# Patient Record
Sex: Male | Born: 1960 | Race: White | Hispanic: No | Smoking: Former smoker
Health system: Southern US, Community
[De-identification: ages and names within clinical notes are randomized; demographics above are authoritative.]

## PROBLEM LIST (undated history)

## (undated) DIAGNOSIS — G709 Myoneural disorder, unspecified: Secondary | ICD-10-CM

## (undated) DIAGNOSIS — F32A Depression, unspecified: Secondary | ICD-10-CM

## (undated) DIAGNOSIS — L570 Actinic keratosis: Secondary | ICD-10-CM

## (undated) DIAGNOSIS — C801 Malignant (primary) neoplasm, unspecified: Secondary | ICD-10-CM

## (undated) DIAGNOSIS — H9319 Tinnitus, unspecified ear: Secondary | ICD-10-CM

## (undated) DIAGNOSIS — E119 Type 2 diabetes mellitus without complications: Secondary | ICD-10-CM

## (undated) DIAGNOSIS — E785 Hyperlipidemia, unspecified: Secondary | ICD-10-CM

## (undated) DIAGNOSIS — R0600 Dyspnea, unspecified: Secondary | ICD-10-CM

## (undated) DIAGNOSIS — A159 Respiratory tuberculosis unspecified: Secondary | ICD-10-CM

## (undated) DIAGNOSIS — I10 Essential (primary) hypertension: Secondary | ICD-10-CM

## (undated) DIAGNOSIS — I739 Peripheral vascular disease, unspecified: Secondary | ICD-10-CM

## (undated) DIAGNOSIS — R5383 Other fatigue: Secondary | ICD-10-CM

## (undated) DIAGNOSIS — E291 Testicular hypofunction: Secondary | ICD-10-CM

## (undated) DIAGNOSIS — G4730 Sleep apnea, unspecified: Secondary | ICD-10-CM

## (undated) DIAGNOSIS — G629 Polyneuropathy, unspecified: Secondary | ICD-10-CM

## (undated) DIAGNOSIS — F329 Major depressive disorder, single episode, unspecified: Secondary | ICD-10-CM

## (undated) HISTORY — PX: KNEE ARTHROSCOPY: SUR90

## (undated) HISTORY — DX: Testicular hypofunction: E29.1

## (undated) HISTORY — PX: OTHER SURGICAL HISTORY: SHX169

## (undated) HISTORY — PX: CARPAL TUNNEL RELEASE: SHX101

## (undated) HISTORY — DX: Actinic keratosis: L57.0

## (undated) HISTORY — DX: Polyneuropathy, unspecified: G62.9

## (undated) HISTORY — DX: Hyperlipidemia, unspecified: E78.5

## (undated) HISTORY — DX: Type 2 diabetes mellitus without complications: E11.9

## (undated) HISTORY — DX: Other fatigue: R53.83

## (undated) HISTORY — DX: Peripheral vascular disease, unspecified: I73.9

---

## 1989-08-03 HISTORY — PX: VASECTOMY: SHX75

## 2005-03-19 ENCOUNTER — Ambulatory Visit: Payer: Self-pay | Admitting: Family Medicine

## 2006-12-13 ENCOUNTER — Ambulatory Visit: Payer: Self-pay | Admitting: Family Medicine

## 2007-03-24 ENCOUNTER — Emergency Department: Payer: Self-pay | Admitting: Emergency Medicine

## 2008-08-13 ENCOUNTER — Ambulatory Visit: Payer: Self-pay | Admitting: Family Medicine

## 2009-02-27 ENCOUNTER — Ambulatory Visit: Payer: Self-pay | Admitting: Family Medicine

## 2009-03-11 ENCOUNTER — Ambulatory Visit: Payer: Self-pay | Admitting: Family Medicine

## 2010-03-23 ENCOUNTER — Ambulatory Visit: Payer: Self-pay | Admitting: Family Medicine

## 2010-06-26 ENCOUNTER — Emergency Department: Payer: Self-pay | Admitting: Emergency Medicine

## 2010-08-03 HISTORY — PX: BACK SURGERY: SHX140

## 2010-08-03 HISTORY — PX: CERVICAL FUSION: SHX112

## 2010-08-06 ENCOUNTER — Ambulatory Visit (HOSPITAL_COMMUNITY)
Admission: RE | Admit: 2010-08-06 | Discharge: 2010-08-07 | Payer: Self-pay | Source: Home / Self Care | Attending: Neurosurgery | Admitting: Neurosurgery

## 2010-10-13 LAB — BASIC METABOLIC PANEL
CO2: 30 mEq/L (ref 19–32)
Calcium: 9.2 mg/dL (ref 8.4–10.5)
Chloride: 105 mEq/L (ref 96–112)
Glucose, Bld: 121 mg/dL — ABNORMAL HIGH (ref 70–99)
Potassium: 5.4 mEq/L — ABNORMAL HIGH (ref 3.5–5.1)
Sodium: 139 mEq/L (ref 135–145)

## 2010-10-13 LAB — CBC
HCT: 46.4 % (ref 39.0–52.0)
Hemoglobin: 16.8 g/dL (ref 13.0–17.0)
MCH: 33.6 pg (ref 26.0–34.0)
MCHC: 36.2 g/dL — ABNORMAL HIGH (ref 30.0–36.0)
MCV: 92.8 fL (ref 78.0–100.0)

## 2011-03-11 ENCOUNTER — Ambulatory Visit: Payer: Self-pay | Admitting: Family Medicine

## 2011-04-23 ENCOUNTER — Other Ambulatory Visit: Payer: Self-pay | Admitting: Neurosurgery

## 2011-04-23 DIAGNOSIS — M79606 Pain in leg, unspecified: Secondary | ICD-10-CM

## 2011-04-24 ENCOUNTER — Inpatient Hospital Stay: Admission: RE | Admit: 2011-04-24 | Payer: Self-pay | Source: Ambulatory Visit

## 2011-04-28 ENCOUNTER — Ambulatory Visit
Admission: RE | Admit: 2011-04-28 | Discharge: 2011-04-28 | Disposition: A | Discharge status: Home / Self Care | Payer: 59 | Source: Ambulatory Visit | Attending: Neurosurgery | Admitting: Neurosurgery

## 2011-04-28 DIAGNOSIS — M79606 Pain in leg, unspecified: Secondary | ICD-10-CM

## 2011-06-12 ENCOUNTER — Encounter (HOSPITAL_COMMUNITY): Payer: Self-pay | Admitting: Pharmacy Technician

## 2011-06-15 ENCOUNTER — Encounter (HOSPITAL_COMMUNITY): Payer: Self-pay

## 2011-06-16 ENCOUNTER — Encounter (HOSPITAL_COMMUNITY)
Admission: RE | Admit: 2011-06-16 | Discharge: 2011-06-16 | Disposition: A | Discharge status: Home / Self Care | Payer: 59 | Source: Ambulatory Visit | Attending: Neurosurgery | Admitting: Neurosurgery

## 2011-06-16 ENCOUNTER — Encounter (HOSPITAL_COMMUNITY): Payer: Self-pay

## 2011-06-16 HISTORY — DX: Respiratory tuberculosis unspecified: A15.9

## 2011-06-16 HISTORY — DX: Essential (primary) hypertension: I10

## 2011-06-16 LAB — CBC
Platelets: 204 10*3/uL (ref 150–400)
RBC: 4.7 MIL/uL (ref 4.22–5.81)
WBC: 7.9 10*3/uL (ref 4.0–10.5)

## 2011-06-16 LAB — BASIC METABOLIC PANEL
CO2: 30 mEq/L (ref 19–32)
Calcium: 9.7 mg/dL (ref 8.4–10.5)
GFR calc non Af Amer: 76 mL/min — ABNORMAL LOW (ref >90)
Sodium: 138 mEq/L (ref 135–145)

## 2011-06-16 LAB — SURGICAL PCR SCREEN
MRSA, PCR: NEGATIVE
Staphylococcus aureus: NEGATIVE

## 2011-06-16 LAB — PROTIME-INR
INR: 0.93 (ref 0.00–1.49)
Prothrombin Time: 12.7 seconds (ref 11.6–15.2)

## 2011-06-16 MED ORDER — CEFAZOLIN SODIUM 1-5 GM-% IV SOLN: 1.0000 g | INTRAVENOUS | Status: DC

## 2011-06-16 NOTE — Pre-Procedure Instructions (Signed)
18 Jeff [name]  06/16/2011   Your procedure is scheduled on:  NOV 19 Report to [facility] at 0815 AM.  Call this number if you have problems the morning of surgery: [phone]   Remember:   Do not eat food:After Midnight.  Do not drink clear liquids: 4 Hours before arrival.  Take these medicines the morning of surgery with A SIP OF WATER: METOPROLOL,GABAPENTIN,OXYCODONE   Do not wear jewelry, make-up or nail polish.  Do not wear lotions, powders, or perfumes. You may wear deodorant.  Do not shave 48 hours prior to surgery.  Do not bring valuables to the hospital.  Contacts, dentures or bridgework may not be worn into surgery.  Leave suitcase in the car. After surgery it may be brought to your room.  For patients admitted to the hospital, checkout time is 11:00 AM the day of discharge.   Patients discharged the day of surgery will not be allowed to drive home.  Name and phone number of your driver: [employer] Special Instructions: CHG Shower Use Special Wash: 1/2 bottle night before surgery and 1/2 bottle morning of surgery.   Please read over the following fact sheets that you were given: Pain Booklet, MRSA Information and Surgical Site Infection Prevention

## 2011-06-21 MED ORDER — CEFAZOLIN SODIUM-DEXTROSE 2-3 GM-% IV SOLR
2.0000 g | Freq: Once | INTRAVENOUS | Status: DC
  Filled 2011-06-21 (×2): qty 50

## 2011-06-22 ENCOUNTER — Encounter (HOSPITAL_COMMUNITY): Payer: Self-pay | Admitting: Anesthesiology

## 2011-06-22 ENCOUNTER — Encounter (HOSPITAL_COMMUNITY): Payer: Self-pay

## 2011-06-22 ENCOUNTER — Inpatient Hospital Stay (HOSPITAL_COMMUNITY): Payer: 59 | Admitting: Anesthesiology

## 2011-06-22 ENCOUNTER — Inpatient Hospital Stay (HOSPITAL_COMMUNITY): Payer: 59

## 2011-06-22 ENCOUNTER — Encounter (HOSPITAL_COMMUNITY): Payer: Self-pay | Admitting: Surgery

## 2011-06-22 ENCOUNTER — Inpatient Hospital Stay (HOSPITAL_COMMUNITY)
Admission: RE | Admit: 2011-06-22 | Discharge: 2011-07-11 | DRG: 491 | Disposition: A | Discharge status: Skilled Nursing Facility | Payer: 59 | Source: Ambulatory Visit | Attending: Neurosurgery | Admitting: Neurosurgery

## 2011-06-22 ENCOUNTER — Encounter (HOSPITAL_COMMUNITY)
Admission: RE | Disposition: A | Discharge status: Skilled Nursing Facility | Payer: Self-pay | Source: Ambulatory Visit | Attending: Neurosurgery

## 2011-06-22 DIAGNOSIS — G250 Essential tremor: Secondary | ICD-10-CM | POA: Diagnosis not present

## 2011-06-22 DIAGNOSIS — M4804 Spinal stenosis, thoracic region: Secondary | ICD-10-CM

## 2011-06-22 DIAGNOSIS — I10 Essential (primary) hypertension: Secondary | ICD-10-CM | POA: Diagnosis present

## 2011-06-22 DIAGNOSIS — Z01812 Encounter for preprocedural laboratory examination: Secondary | ICD-10-CM

## 2011-06-22 DIAGNOSIS — T4275XA Adverse effect of unspecified antiepileptic and sedative-hypnotic drugs, initial encounter: Secondary | ICD-10-CM | POA: Diagnosis not present

## 2011-06-22 DIAGNOSIS — Z7982 Long term (current) use of aspirin: Secondary | ICD-10-CM

## 2011-06-22 DIAGNOSIS — M4714 Other spondylosis with myelopathy, thoracic region: Principal | ICD-10-CM | POA: Diagnosis present

## 2011-06-22 DIAGNOSIS — F4323 Adjustment disorder with mixed anxiety and depressed mood: Secondary | ICD-10-CM | POA: Diagnosis not present

## 2011-06-22 HISTORY — PX: LUMBAR LAMINECTOMY/DECOMPRESSION MICRODISCECTOMY: SHX5026

## 2011-06-22 SURGERY — LUMBAR LAMINECTOMY/DECOMPRESSION MICRODISCECTOMY
Anesthesia: General | Site: Spine Thoracic | Wound class: Clean

## 2011-06-22 MED ORDER — CEFAZOLIN SODIUM-DEXTROSE 2-3 GM-% IV SOLR
2.0000 g | Freq: Three times a day (TID) | INTRAVENOUS | Status: AC
  Administered 2011-06-22 – 2011-06-23 (×2): 2 g via INTRAVENOUS
  Filled 2011-06-22 (×3): qty 50

## 2011-06-22 MED ORDER — ONDANSETRON HCL 4 MG/2ML IJ SOLN: 4.0000 mg | Freq: Once | INTRAMUSCULAR | Status: DC | PRN

## 2011-06-22 MED ORDER — ACETAMINOPHEN 650 MG RE SUPP: 650.0000 mg | RECTAL | Status: DC | PRN

## 2011-06-22 MED ORDER — MENTHOL 3 MG MT LOZG: 1.0000 | LOZENGE | OROMUCOSAL | Status: DC | PRN

## 2011-06-22 MED ORDER — METOPROLOL TARTRATE 100 MG PO TABS
100.0000 mg | ORAL_TABLET | Freq: Two times a day (BID) | ORAL | Status: DC
  Administered 2011-06-25 – 2011-07-11 (×22): 100 mg via ORAL
  Filled 2011-06-22 (×40): qty 1

## 2011-06-22 MED ORDER — POTASSIUM CHLORIDE CRYS ER 20 MEQ PO TBCR
20.0000 meq | EXTENDED_RELEASE_TABLET | Freq: Every day | ORAL | Status: DC
  Administered 2011-06-23 – 2011-07-11 (×19): 20 meq via ORAL
  Filled 2011-06-22 (×20): qty 1

## 2011-06-22 MED ORDER — DOCUSATE SODIUM 100 MG PO CAPS
100.0000 mg | ORAL_CAPSULE | Freq: Two times a day (BID) | ORAL | Status: DC
  Administered 2011-06-22 – 2011-07-11 (×37): 100 mg via ORAL
  Filled 2011-06-22 (×35): qty 1

## 2011-06-22 MED ORDER — TESTOSTERONE 30 MG/ACT TD SOLN
30.0000 mg | Freq: Every day | TRANSDERMAL | Status: DC
Start: 2011-06-22 — End: 2011-06-22

## 2011-06-22 MED ORDER — ROCURONIUM BROMIDE 100 MG/10ML IV SOLN
INTRAVENOUS | Status: DC | PRN
  Administered 2011-06-22: 50 mg via INTRAVENOUS

## 2011-06-22 MED ORDER — HYDROCODONE-ACETAMINOPHEN 5-325 MG PO TABS
1.0000 | ORAL_TABLET | ORAL | Status: DC | PRN
  Administered 2011-06-23 (×4): 2 via ORAL
  Administered 2011-06-25 (×2): 1 via ORAL
  Administered 2011-06-26 – 2011-07-10 (×21): 2 via ORAL
  Filled 2011-06-22 (×7): qty 2
  Filled 2011-06-22: qty 1
  Filled 2011-06-22 (×17): qty 2
  Filled 2011-06-22: qty 1
  Filled 2011-06-22 (×5): qty 2

## 2011-06-22 MED ORDER — SODIUM CHLORIDE 0.9 % IV SOLN
250.0000 mL | INTRAVENOUS | Status: DC
  Administered 2011-06-25: 250 mL via INTRAVENOUS
  Administered 2011-06-26: 1000 mL via INTRAVENOUS

## 2011-06-22 MED ORDER — DIAZEPAM 5 MG PO TABS
5.0000 mg | ORAL_TABLET | Freq: Four times a day (QID) | ORAL | Status: DC | PRN
  Administered 2011-06-22 – 2011-06-24 (×4): 5 mg via ORAL
  Filled 2011-06-22 (×4): qty 1

## 2011-06-22 MED ORDER — HYDROMORPHONE 0.3 MG/ML IV SOLN
INTRAVENOUS | Status: DC
  Administered 2011-06-22 (×2): 7.5 mg via INTRAVENOUS
  Administered 2011-06-22: 2.4 mg via INTRAVENOUS
  Administered 2011-06-23: 3.6 mg via INTRAVENOUS
  Administered 2011-06-23: 2.7 mg via INTRAVENOUS
  Administered 2011-06-23: 7.5 mg via INTRAVENOUS
  Filled 2011-06-22 (×3): qty 25

## 2011-06-22 MED ORDER — HEMOSTATIC AGENTS (NO CHARGE) OPTIME
TOPICAL | Status: DC | PRN
  Administered 2011-06-22: 1 via TOPICAL

## 2011-06-22 MED ORDER — HYDROMORPHONE HCL PF 1 MG/ML IJ SOLN
0.2500 mg | INTRAMUSCULAR | Status: AC | PRN
  Administered 2011-06-22 (×8): 0.5 mg via INTRAVENOUS

## 2011-06-22 MED ORDER — DEXAMETHASONE SODIUM PHOSPHATE 4 MG/ML IJ SOLN
8.0000 mg | Freq: Once | INTRAMUSCULAR | Status: AC
  Administered 2011-06-22: 8 mg via INTRAVENOUS

## 2011-06-22 MED ORDER — OXYCODONE-ACETAMINOPHEN 5-325 MG PO TABS
1.0000 | ORAL_TABLET | ORAL | Status: DC | PRN
  Administered 2011-06-22: 1 via ORAL
  Administered 2011-06-23 – 2011-07-10 (×28): 2 via ORAL
  Filled 2011-06-22 (×28): qty 2
  Filled 2011-06-22: qty 1
  Filled 2011-06-22: qty 2

## 2011-06-22 MED ORDER — SODIUM CHLORIDE 0.9 % IR SOLN
Status: DC | PRN
  Administered 2011-06-22: 1000 mL

## 2011-06-22 MED ORDER — MIDAZOLAM HCL 5 MG/5ML IJ SOLN
INTRAMUSCULAR | Status: DC | PRN
  Administered 2011-06-22: 1 mg via INTRAVENOUS

## 2011-06-22 MED ORDER — SODIUM CHLORIDE 0.9 % IJ SOLN
3.0000 mL | Freq: Two times a day (BID) | INTRAMUSCULAR | Status: DC
  Administered 2011-06-22 – 2011-07-05 (×18): 3 mL via INTRAVENOUS

## 2011-06-22 MED ORDER — BACITRACIN ZINC 500 UNIT/GM EX OINT
TOPICAL_OINTMENT | CUTANEOUS | Status: DC | PRN
  Administered 2011-06-22: 1 via TOPICAL

## 2011-06-22 MED ORDER — EPHEDRINE SULFATE 50 MG/ML IJ SOLN
INTRAMUSCULAR | Status: DC | PRN
  Administered 2011-06-22: 10 mg via INTRAVENOUS
  Administered 2011-06-22: 5 mg via INTRAVENOUS

## 2011-06-22 MED ORDER — MIDAZOLAM HCL 2 MG/2ML IJ SOLN
0.5000 mg | INTRAMUSCULAR | Status: AC | PRN
  Administered 2011-06-22 (×2): 0.5 mg via INTRAVENOUS

## 2011-06-22 MED ORDER — VECURONIUM BROMIDE 10 MG IV SOLR
INTRAVENOUS | Status: DC | PRN
  Administered 2011-06-22 (×3): 2 mg via INTRAVENOUS

## 2011-06-22 MED ORDER — CEFAZOLIN SODIUM 1-5 GM-% IV SOLN
INTRAVENOUS | Status: DC | PRN
  Administered 2011-06-22: 2 g via INTRAVENOUS

## 2011-06-22 MED ORDER — LACTATED RINGERS IV SOLN
INTRAVENOUS | Status: DC
  Administered 2011-06-23 – 2011-06-28 (×5): via INTRAVENOUS

## 2011-06-22 MED ORDER — SODIUM CHLORIDE 0.9 % IJ SOLN: 3.0000 mL | INTRAMUSCULAR | Status: DC | PRN

## 2011-06-22 MED ORDER — DIAZEPAM 5 MG/ML IJ SOLN: 5.0000 mg | INTRAMUSCULAR | Status: DC

## 2011-06-22 MED ORDER — SODIUM CHLORIDE 0.9 % IR SOLN
Status: DC | PRN
  Administered 2011-06-22: 10:00:00

## 2011-06-22 MED ORDER — ZOLPIDEM TARTRATE 10 MG PO TABS
10.0000 mg | ORAL_TABLET | Freq: Every evening | ORAL | Status: DC | PRN
  Administered 2011-06-25 – 2011-06-28 (×3): 10 mg via ORAL
  Filled 2011-06-22 (×3): qty 1

## 2011-06-22 MED ORDER — ONDANSETRON HCL 4 MG/2ML IJ SOLN: 4.0000 mg | Freq: Four times a day (QID) | INTRAMUSCULAR | Status: DC | PRN

## 2011-06-22 MED ORDER — FENTANYL CITRATE 0.05 MG/ML IJ SOLN
INTRAMUSCULAR | Status: DC | PRN
  Administered 2011-06-22: 50 ug via INTRAVENOUS
  Administered 2011-06-22: 150 ug via INTRAVENOUS
  Administered 2011-06-22: 50 ug via INTRAVENOUS

## 2011-06-22 MED ORDER — PROPOFOL 10 MG/ML IV EMUL
INTRAVENOUS | Status: DC | PRN
  Administered 2011-06-22: 200 mg via INTRAVENOUS

## 2011-06-22 MED ORDER — DIPHENHYDRAMINE HCL 12.5 MG/5ML PO ELIX
12.5000 mg | ORAL_SOLUTION | Freq: Four times a day (QID) | ORAL | Status: DC | PRN
  Filled 2011-06-22: qty 5

## 2011-06-22 MED ORDER — DIPHENHYDRAMINE HCL 50 MG/ML IJ SOLN: 12.5000 mg | Freq: Four times a day (QID) | INTRAMUSCULAR | Status: DC | PRN

## 2011-06-22 MED ORDER — TESTOSTERONE 50 MG/5GM (1%) TD GEL
5.0000 g | Freq: Every day | TRANSDERMAL | Status: DC
  Administered 2011-06-25 – 2011-07-10 (×15): 5 g via TRANSDERMAL
  Filled 2011-06-22 (×14): qty 5

## 2011-06-22 MED ORDER — DEXAMETHASONE SODIUM PHOSPHATE 4 MG/ML IJ SOLN: 8.0000 mg | Freq: Once | INTRAMUSCULAR | Status: DC

## 2011-06-22 MED ORDER — NALOXONE HCL 0.4 MG/ML IJ SOLN: 0.4000 mg | INTRAMUSCULAR | Status: DC | PRN

## 2011-06-22 MED ORDER — GABAPENTIN 600 MG PO TABS: 600.0000 mg | ORAL_TABLET | Freq: Three times a day (TID) | ORAL | Status: DC

## 2011-06-22 MED ORDER — NEOSTIGMINE METHYLSULFATE 1 MG/ML IJ SOLN
INTRAMUSCULAR | Status: DC | PRN
  Administered 2011-06-22: 5 mg via INTRAVENOUS

## 2011-06-22 MED ORDER — KETOROLAC TROMETHAMINE 30 MG/ML IJ SOLN
30.0000 mg | Freq: Once | INTRAMUSCULAR | Status: DC
  Administered 2011-06-23: 30 mg via INTRAVENOUS

## 2011-06-22 MED ORDER — HYDROCHLOROTHIAZIDE 25 MG PO TABS
25.0000 mg | ORAL_TABLET | Freq: Every day | ORAL | Status: DC
  Administered 2011-06-25 – 2011-07-11 (×13): 25 mg via ORAL
  Filled 2011-06-22 (×21): qty 1

## 2011-06-22 MED ORDER — ONDANSETRON HCL 4 MG/2ML IJ SOLN
INTRAMUSCULAR | Status: DC | PRN
  Administered 2011-06-22: 4 mg via INTRAVENOUS

## 2011-06-22 MED ORDER — MORPHINE SULFATE 10 MG/ML IJ SOLN
INTRAMUSCULAR | Status: DC | PRN
  Administered 2011-06-22: 4 mg via INTRAVENOUS

## 2011-06-22 MED ORDER — ONDANSETRON HCL 4 MG/2ML IJ SOLN: 4.0000 mg | INTRAMUSCULAR | Status: DC | PRN

## 2011-06-22 MED ORDER — OXYCODONE HCL 20 MG PO TB12
20.0000 mg | ORAL_TABLET | Freq: Two times a day (BID) | ORAL | Status: DC
  Administered 2011-06-22 – 2011-06-27 (×10): 20 mg via ORAL
  Filled 2011-06-22 (×3): qty 1
  Filled 2011-06-22: qty 2
  Filled 2011-06-22: qty 1
  Filled 2011-06-22: qty 2
  Filled 2011-06-22: qty 1
  Filled 2011-06-22 (×3): qty 2

## 2011-06-22 MED ORDER — OLMESARTAN MEDOXOMIL 20 MG PO TABS
20.0000 mg | ORAL_TABLET | Freq: Every day | ORAL | Status: DC
  Administered 2011-06-25 – 2011-07-11 (×12): 20 mg via ORAL
  Filled 2011-06-22 (×21): qty 1

## 2011-06-22 MED ORDER — ACETAMINOPHEN 325 MG PO TABS
650.0000 mg | ORAL_TABLET | ORAL | Status: DC | PRN
  Administered 2011-07-01: 650 mg via ORAL
  Filled 2011-06-22: qty 2

## 2011-06-22 MED ORDER — BUPIVACAINE LIPOSOME 1.3 % IJ SUSP
20.0000 mL | INTRAMUSCULAR | Status: AC
  Administered 2011-06-22: 20 mL
  Filled 2011-06-22: qty 20

## 2011-06-22 MED ORDER — GABAPENTIN 300 MG PO CAPS
300.0000 mg | ORAL_CAPSULE | Freq: Three times a day (TID) | ORAL | Status: DC
  Administered 2011-06-22 – 2011-06-24 (×6): 300 mg via ORAL
  Filled 2011-06-22 (×8): qty 1

## 2011-06-22 MED ORDER — VALSARTAN-HYDROCHLOROTHIAZIDE 160-25 MG PO TABS: 1.0000 | ORAL_TABLET | Freq: Every day | ORAL | Status: DC

## 2011-06-22 MED ORDER — BUPIVACAINE-EPINEPHRINE PF 0.5-1:200000 % IJ SOLN
INTRAMUSCULAR | Status: DC | PRN
  Administered 2011-06-22: 10 mL

## 2011-06-22 MED ORDER — THROMBIN 5000 UNITS EX KIT
PACK | CUTANEOUS | Status: DC | PRN
  Administered 2011-06-22: 5000 [IU] via TOPICAL

## 2011-06-22 MED ORDER — SODIUM CHLORIDE 0.9 % IJ SOLN: 9.0000 mL | INTRAMUSCULAR | Status: DC | PRN

## 2011-06-22 MED ORDER — PHENOL 1.4 % MT LIQD: 1.0000 | OROMUCOSAL | Status: DC | PRN

## 2011-06-22 MED ORDER — GLYCOPYRROLATE 0.2 MG/ML IJ SOLN
INTRAMUSCULAR | Status: DC | PRN
  Administered 2011-06-22: 1 mg via INTRAVENOUS

## 2011-06-22 MED ORDER — LACTATED RINGERS IV SOLN
INTRAVENOUS | Status: DC | PRN
  Administered 2011-06-22 (×2): via INTRAVENOUS

## 2011-06-22 MED ORDER — HYDROMORPHONE HCL PF 1 MG/ML IJ SOLN
0.5000 mg | INTRAMUSCULAR | Status: DC | PRN
Start: 2011-06-22 — End: 2011-06-22

## 2011-06-22 SURGICAL SUPPLY — 57 items
APL SKNCLS STERI-STRIP NONHPOA (GAUZE/BANDAGES/DRESSINGS) ×1
BAG DECANTER FOR FLEXI CONT (MISCELLANEOUS) ×2 IMPLANT
BENZOIN TINCTURE PRP APPL 2/3 (GAUZE/BANDAGES/DRESSINGS) ×2 IMPLANT
BIT DRILL NEURO 2X3.1 SFT TUCH (MISCELLANEOUS) IMPLANT
BLADE SURG ROTATE 9660 (MISCELLANEOUS) ×1 IMPLANT
BRUSH SCRUB EZ PLAIN DRY (MISCELLANEOUS) ×2 IMPLANT
BUR ACORN 6.0 (BURR) ×2 IMPLANT
BUR MATCHSTICK NEURO 3.0 LAGG (BURR) ×2 IMPLANT
CANISTER SUCTION 2500CC (MISCELLANEOUS) ×2 IMPLANT
CLOTH BEACON ORANGE TIMEOUT ST (SAFETY) ×2 IMPLANT
CONT SPEC 4OZ CLIKSEAL STRL BL (MISCELLANEOUS) ×2 IMPLANT
DRAPE LAPAROTOMY 100X72X124 (DRAPES) ×2 IMPLANT
DRAPE MICROSCOPE LEICA (MISCELLANEOUS) ×2 IMPLANT
DRAPE POUCH INSTRU U-SHP 10X18 (DRAPES) ×2 IMPLANT
DRAPE SURG 17X23 STRL (DRAPES) ×8 IMPLANT
DRILL NEURO 2X3.1 SOFT TOUCH (MISCELLANEOUS) ×2
ELECT BLADE 4.0 EZ CLEAN MEGAD (MISCELLANEOUS) ×2
ELECT REM PT RETURN 9FT ADLT (ELECTROSURGICAL) ×2
ELECTRODE BLDE 4.0 EZ CLN MEGD (MISCELLANEOUS) ×1 IMPLANT
ELECTRODE REM PT RTRN 9FT ADLT (ELECTROSURGICAL) ×1 IMPLANT
GAUZE SPONGE 4X4 16PLY XRAY LF (GAUZE/BANDAGES/DRESSINGS) ×1 IMPLANT
GLOVE BIO SURGEON STRL SZ8 (GLOVE) ×1 IMPLANT
GLOVE BIO SURGEON STRL SZ8.5 (GLOVE) ×2 IMPLANT
GLOVE BIOGEL PI IND STRL 8.5 (GLOVE) IMPLANT
GLOVE BIOGEL PI INDICATOR 8.5 (GLOVE) ×2
GLOVE ECLIPSE 7.5 STRL STRAW (GLOVE) ×2 IMPLANT
GLOVE EXAM NITRILE LRG STRL (GLOVE) IMPLANT
GLOVE EXAM NITRILE MD LF STRL (GLOVE) ×1 IMPLANT
GLOVE EXAM NITRILE XL STR (GLOVE) IMPLANT
GLOVE EXAM NITRILE XS STR PU (GLOVE) IMPLANT
GLOVE SS BIOGEL STRL SZ 8 (GLOVE) ×1 IMPLANT
GLOVE SUPERSENSE BIOGEL SZ 8 (GLOVE) ×1
GOWN BRE IMP SLV AUR LG STRL (GOWN DISPOSABLE) IMPLANT
GOWN BRE IMP SLV AUR XL STRL (GOWN DISPOSABLE) ×3 IMPLANT
GOWN STRL REIN 2XL LVL4 (GOWN DISPOSABLE) ×1 IMPLANT
KIT BASIN OR (CUSTOM PROCEDURE TRAY) ×2 IMPLANT
KIT ROOM TURNOVER OR (KITS) ×2 IMPLANT
NDL HYPO 21X1.5 SAFETY (NEEDLE) IMPLANT
NEEDLE HYPO 21X1.5 SAFETY (NEEDLE) ×2 IMPLANT
NEEDLE HYPO 22GX1.5 SAFETY (NEEDLE) ×2 IMPLANT
NS IRRIG 1000ML POUR BTL (IV SOLUTION) ×2 IMPLANT
PACK LAMINECTOMY NEURO (CUSTOM PROCEDURE TRAY) ×2 IMPLANT
PAD ARMBOARD 7.5X6 YLW CONV (MISCELLANEOUS) ×8 IMPLANT
PATTIES SURGICAL .5 X1 (DISPOSABLE) ×1 IMPLANT
RUBBERBAND STERILE (MISCELLANEOUS) ×4 IMPLANT
SPONGE GAUZE 4X4 12PLY (GAUZE/BANDAGES/DRESSINGS) ×2 IMPLANT
SPONGE SURGIFOAM ABS GEL SZ50 (HEMOSTASIS) ×2 IMPLANT
STRIP CLOSURE SKIN 1/2X4 (GAUZE/BANDAGES/DRESSINGS) ×2 IMPLANT
SUT VIC AB 1 CT1 18XBRD ANBCTR (SUTURE) ×2 IMPLANT
SUT VIC AB 1 CT1 8-18 (SUTURE) ×4
SUT VIC AB 2-0 CP2 18 (SUTURE) ×4 IMPLANT
SYR 20CC LL (SYRINGE) ×1 IMPLANT
SYR 20ML ECCENTRIC (SYRINGE) ×2 IMPLANT
TAPE CLOTH SURG 4X10 WHT LF (GAUZE/BANDAGES/DRESSINGS) ×1 IMPLANT
TOWEL OR 17X24 6PK STRL BLUE (TOWEL DISPOSABLE) ×2 IMPLANT
TOWEL OR 17X26 10 PK STRL BLUE (TOWEL DISPOSABLE) ×2 IMPLANT
WATER STERILE IRR 1000ML POUR (IV SOLUTION) ×2 IMPLANT

## 2011-06-22 NOTE — Anesthesia Procedure Notes (Addendum)
Procedure Name: Intubation Date/Time: 06/22/2011 9:20 AM Performed by: Carmela Rima Pre-anesthesia Checklist: Emergency Drugs available, Patient identified, Timeout performed, Suction available and Patient being monitored Patient Re-evaluated:Patient Re-evaluated prior to inductionOxygen Delivery Method: Circle System Utilized Preoxygenation: Pre-oxygenation with 100% oxygen Intubation Type: IV induction Ventilation: Mask ventilation without difficulty Laryngoscope Size: Miller and 2 Grade View: Grade I Tube type: Oral Tube size: 7.5 mm Number of attempts: 1 (intubation by Tish Frederickson, SRNA) Placement Confirmation: ETT inserted through vocal cords under direct vision,  CO2 detector,  positive ETCO2 and breath sounds checked- equal and bilateral Secured at: 23 cm Tube secured with: Tape Dental Injury: Teeth and Oropharynx as per pre-operative assessment

## 2011-06-22 NOTE — Plan of Care (Signed)
Problem: Consults Goal: Diagnosis - Spinal Surgery Lumbar Laminectomy (Complex)     

## 2011-06-22 NOTE — Anesthesia Preprocedure Evaluation (Addendum)
Anesthesia Evaluation  Patient identified by MRN, date of birth, ID band Patient awake    Reviewed: Allergy & Precautions, H&P , NPO status , Patient's Chart, lab work & pertinent test results  Airway Mallampati: II TM Distance: >3 FB Neck ROM: full    Dental  (+) Teeth Intact, Poor Dentition and Dental Advidsory Given   Pulmonary neg pulmonary ROS,    Pulmonary exam normal       Cardiovascular Exercise Tolerance: Good hypertension, regular Normal    Neuro/Psych Negative Neurological ROS  Negative Psych ROS   GI/Hepatic negative GI ROS, Neg liver ROS,   Endo/Other  Negative Endocrine ROS  Renal/GU negative Renal ROS  Genitourinary negative   Musculoskeletal   Abdominal   Peds  Hematology negative hematology ROS (+)   Anesthesia Other Findings   Reproductive/Obstetrics                         Anesthesia Physical Anesthesia Plan  ASA: II  Anesthesia Plan: General   Post-op Pain Management:    Induction: Intravenous  Airway Management Planned: Oral ETT  Additional Equipment:   Intra-op Plan:   Post-operative Plan: Extubation in OR  Informed Consent: I have reviewed the patients History and Physical, chart, labs and discussed the procedure including the risks, benefits and alternatives for the proposed anesthesia with the patient or authorized representative who has indicated his/her understanding and acceptance.     Plan Discussed with: CRNA, Anesthesiologist and Surgeon  Anesthesia Plan Comments:       Anesthesia Quick Evaluation

## 2011-06-22 NOTE — Transfer of Care (Signed)
Immediate Anesthesia Transfer of Care Note  Patient: Jeff [name]  Procedure(s) Performed:  LUMBAR LAMINECTOMY/DECOMPRESSION MICRODISCECTOMY - Thoracic Ten-Eleven,Thoracic Eleven-Twelve Laminectomy  Patient Location: PACU  Anesthesia Type: General  Level of Consciousness: awake, alert  and oriented  Airway & Oxygen Therapy: Patient Spontanous Breathing and Patient connected to nasal cannula oxygen  Post-op Assessment: Report given to PACU RN, Post -op Vital signs reviewed and stable, Patient moving all extremities and Patient moving all extremities X 4  Post vital signs: Reviewed and stable  Complications: No apparent anesthesia complications

## 2011-06-22 NOTE — Progress Notes (Signed)
Subjective:  The patient is alert and pleasant. He complains of cramping in the back of his right leg. He appears uncomfortable.  Objective: Vital signs in last 24 hours: Temp:  [97.4 F (36.3 C)-97.7 F (36.5 C)] 97.4 F (36.3 C) (11/19 1145) Pulse Rate:  [53-67] 66  (11/19 1400) Resp:  [14-34] 18  (11/19 1400) BP: (99-137)/(44-82) 99/56 mmHg (11/19 1330) SpO2:  [94 %-100 %] 99 % (11/19 1400)  Intake/Output from previous day:   Intake/Output this shift: Total I/O In: 2400 [I.V.:2400] Out: 200 [Blood:200]  Physical exam the patient is alert and oriented. His motor strength is 5 over 5 in his bilateral quadriceps gastrocnemius extensor hallucis longus.  Lab Results: No results found for this basename: WBC:2,HGB:2,HCT:2,PLT:2 in the last 72 hours BMET No results found for this basename: NA:2,K:2,CL:2,CO2:2,GLUCOSE:2,BUN:2,CREATININE:2,CALCIUM:2 in the last 72 hours  Studies/Results: Dg Thoracolumabar Spine  06/22/2011  *RADIOLOGY REPORT*  Clinical Data: T10-12 laminectomy and microdiskectomy.  THORACOLUMBAR SPINE - 2 VIEW  Comparison: Thoracic spine MR 03/23/2011.  Findings: Two intraoperative cross-table lateral views of the thoracolumbar spine are submitted.  The first film, taken at 0955 hours, shows a surgical instrument tip projecting anterior to the T11 vertebral body.  Multilevel endplate degenerative changes are seen in the spine.  The second film, taken at 1005 hours, shows a surgical instrument tip projecting in the soft tissues posterior to the T11-12 disc space.  IMPRESSION: Intraoperative visualization for T10-12 laminectomy and microdiskectomy.  Original Report Authenticated By: Reyes Ivan, M.D.    Assessment/Plan: Right leg pain: We'll give the patient a dose of Decadron for possible inflammation, Neurontin for possible radicular pain, and Valium for muscle spasms.  LOS: 0 days     Kamonte Mcmichen D 06/22/2011, 2:19 PM

## 2011-06-22 NOTE — Op Note (Signed)
Brief history: The patient is a 50 year old white male who has suffered from back hip and leg pain. He has failed medical management and was worked up with a thoracic MRI. This demonstrated significant stenosis at T10-11 and T11-12. I discussed the various treatment options with the patient. He has decided proceed with surgery after weighing the risks benefits and alternatives to surgery.  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  Postoperative diagnosis: The same  Procedure: T10 and T11 laminectomy using microdissection to decompress the T9, T10, T11 nerve roots and thecal sac.   Surgeon: Dr. Delma Officer  Asst.: Dr. Maeola Harman.  Anesthesia: Gen. endotracheal  Estimated blood loss: 100 cc  Drains: None  Complications: None  Description of procedure: The patient was brought to the operating room by the anesthesia team. General endotracheal anesthesia was induced. The patient was turned to the prone position on the Wilson frame. The patient's lumbosacral region was then prepared with Betadine scrub and Betadine solution. Sterile drapes were applied.  I then injected the area to be incised with Marcaine with epinephrine solution. I then used a scalpel to make a linear midline incision over the T10-11 T11-12 intervertebral disc space. I then used electrocautery to perform a bilateral subperiosteal dissection exposing the spinous process and lamina of T10, T11, and T12. We obtained intraoperative radiograph to confirm our location. I then inserted the [facility] retractor for exposure.  We then brought the operative microscope into the field. Under its magnification and illumination we completed the microdissection. I used a high-speed drill to perform a laminotomy at T10 and T11 bilaterally. I then used a Kerrison punches to widen the laminotomy and removed the ligamentum flavum at T10-11 and T11-12. We then used  microdissection to free up the thecal sac and the T9, T10, and T11 nerve root from the epidural tissue. I then used a Kerrison punch to perform a foraminotomy at about the bilateral nerve roots. I then inspected the thecal sac and the bilateral nerve roots and noted there were well decompressed. This completed the decompression.  We then obtained hemostasis using bipolar electrocautery. We irrigated the wound out with bacitracin solution. We then removed the retractor. We then reapproximated the patient's thoracolumbar fascia with interrupted #1 Vicryl suture. We then reapproximated the patient's subcutaneous tissue with interrupted 3-0 Vicryl suture. We then reapproximated patient's skin with Steri-Strips and benzoin. The was then coated with bacitracin ointment. The drapes were removed. The patient was subsequently returned to the supine position where they were extubated by the anesthesia team. The patient was then transported to the postanesthesia care unit in stable condition. All sponge instrument and needle counts were correct at the end of this case.

## 2011-06-22 NOTE — H&P (Signed)
Subjective: The patient is a 50 year old white male who complains of back pain with bilateral leg pain. He has failed medical management and was worked up with a lumbar MRI. The MRI demonstrated moderate multifactorial spinal stenosis at T10-11 and T11-12. Patient has decided to proceed with surgery.   Past Medical History  Diagnosis Date  . Hypertension   . Tuberculosis     POSITIVE  TB SKIN TEST 1992.6 MTH [place]     Past Surgical History  Procedure Date  . Cervical fusion   . Carpal tunnel release   . Knee arthroscopy     No Known Allergies  History  Substance Use Topics  . Smoking status: Not on file  . Smokeless tobacco: Not on file  . Alcohol Use: Yes    History reviewed. No pertinent family history. Prior to Admission medications   Medication Sig Start Date End Date Taking? Authorizing Provider  aspirin EC 81 MG tablet Take 81 mg by mouth daily.     Yes Historical Provider, MD  gabapentin (NEURONTIN) 300 MG capsule Take 300 mg by mouth 3 (three) times daily.     Yes Historical Provider, MD  metoprolol (LOPRESSOR) 100 MG tablet Take 100 mg by mouth 2 (two) times daily.     Yes Historical Provider, MD  oxyCODONE (OXYCONTIN) 20 MG 12 hr tablet Take 20 mg by mouth every 12 (twelve) hours as needed. For pain.    Yes Historical Provider, MD  potassium chloride SA (K-DUR,KLOR-CON) 20 MEQ tablet Take 20 mEq by mouth daily.     Yes Historical Provider, MD  Testosterone (AXIRON) 30 MG/ACT SOLN Place 30 mg onto the skin daily. One squirt under each arm.    Yes Historical Provider, MD  valsartan-hydrochlorothiazide (DIOVAN-HCT) 160-25 MG per tablet Take 1 tablet by mouth daily.     Yes Historical Provider, MD     Review of Systems  Positive ROS: Negative except as above  All other systems have been reviewed and were otherwise negative with the exception of those mentioned in the HPI and as above.  Objective: Vital signs in last 24 hours: Temp:  [97.7 F (36.5 C)] 97.7 F (36.5 C)  (11/19 0729) Pulse Rate:  [53] 53  (11/19 0729) Resp:  [18] 18  (11/19 0729) BP: (117)/(76) 117/76 mmHg (11/19 0729) SpO2:  [98 %] 98 % (11/19 0729)  General Appearance: Alert, cooperative, no distress, appears stated age Head: Normocephalic, without obvious abnormality, atraumatic Eyes: PERRL, conjunctiva/corneas clear, EOM's intact, fundi benign, both eyes      Ears: Normal TM's and external ear canals, both ears Throat: Lips, mucosa, and tongue normal; teeth and gums normal Neck: Supple, symmetrical, trachea midline, no adenopathy; thyroid: No enlargement/tenderness/nodules; no carotid bruit or JVD Back: Symmetric, no curvature, ROM normal, no CVA tenderness Lungs: Clear to auscultation bilaterally, respirations unlabored Heart: Regular rate and rhythm, S1 and S2 normal, no murmur, rub or gallop Abdomen: Soft, non-tender, bowel sounds active all four quadrants, no masses, no organomegaly Extremities: Extremities normal, atraumatic, no cyanosis or edema Pulses: 2+ and symmetric all extremities Skin: Skin color, texture, turgor normal, no rashes or lesions  NEUROLOGIC:   Mental status: alert and oriented, no aphasia, good attention span, Fund of knowledge/ memory ok Motor Exam - grossly normal Sensory Exam - grossly normal Reflexes:  Coordination - grossly normal Gait - grossly normal Balance - grossly normal Cranial Nerves: I: smell Not tested  II: visual acuity  OS: Normal    OD: Normal  II: visual fields Full to confrontation  II: pupils Equal, round, reactive to light  III,VII: ptosis None  III,IV,VI: extraocular muscles  Full ROM  V: mastication Normal  V: facial light touch sensation  Normal  V,VII: corneal reflex  Present  VII: facial muscle function - upper  Normal  VII: facial muscle function - lower Normal  VIII: hearing Not tested  IX: soft palate elevation  Normal  IX,X: gag reflex Present  XI: trapezius strength  5/5  XI: sternocleidomastoid strength 5/5    XI: neck flexion strength  5/5  XII: tongue strength  Normal    Data Review Lab Results  Component Value Date   WBC 7.9 06/16/2011   HGB 15.6 06/16/2011   HCT 42.8 06/16/2011   MCV 91.1 06/16/2011   PLT 204 06/16/2011   Lab Results  Component Value Date   NA 138 06/16/2011   K 4.9 06/16/2011   CL 101 06/16/2011   CO2 30 06/16/2011   BUN 16 06/16/2011   CREATININE 1.09 06/16/2011   GLUCOSE 125* 06/16/2011   Lab Results  Component Value Date   INR 0.93 06/16/2011   Imaging studies: I reviewed the patient's lumbar MRI [facility]. It demonstrates the patient has moderate multifactorial spinal stenosis at T10-11 and T11-12. 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 I have probably of the abnormalities. We have discussed the various treatment options including surgery. I described the surgical option of a T10 and T11 laminectomy I discussed the risks benefits and alternative surgery. We have discussed the likelihood of achieving our goals. I have answered all his questions. The patient has decided proceed with surgery.   Aleathia Purdy D 06/22/2011 9:08 AM

## 2011-06-22 NOTE — Preoperative (Signed)
Beta Blockers   Reason not to administer Beta Blockers:Not Applicable 

## 2011-06-22 NOTE — Anesthesia Postprocedure Evaluation (Signed)
  Anesthesia Post-op Note  Patient: Jeff [name]  Procedure(s) Performed:  LUMBAR LAMINECTOMY/DECOMPRESSION MICRODISCECTOMY - Thoracic Ten-Eleven,Thoracic Eleven-Twelve Laminectomy  Patient Location: PACU  Anesthesia Type: General  Level of Consciousness: awake, oriented, sedated and patient cooperative  Airway and Oxygen Therapy: Patient Spontanous Breathing and Patient connected to nasal cannula oxygen  Post-op Pain: moderate  Post-op Assessment: Post-op Vital signs reviewed, Patient's Cardiovascular Status Stable, Respiratory Function Stable, Patent Airway, No signs of Nausea or vomiting and Pain level not controlled  Post-op Vital Signs: stable  Complications: No apparent anesthesia complications

## 2011-06-22 NOTE — Progress Notes (Signed)
Subjective:  The patient is alert and complaining of back pain.  Objective: Vital signs in last 24 hours: Temp:  [97.4 F (36.3 C)-97.7 F (36.5 C)] 97.4 F (36.3 C) (11/19 1145) Pulse Rate:  [53] 53  (11/19 0729) Resp:  [18] 18  (11/19 0729) BP: (117)/(76) 117/76 mmHg (11/19 0729) SpO2:  [98 %] 98 % (11/19 0729)  Intake/Output from previous day:   Intake/Output this shift: Total I/O In: 2400 [I.V.:2400] Out: 200 [Blood:200]  Physical exam the patient is alert Glasgow Coma Scale 14. He is moving all 4 extremities well. His dressing is clean and dry.  Lab Results: No results found for this basename: WBC:2,HGB:2,HCT:2,PLT:2 in the last 72 hours BMET No results found for this basename: NA:2,K:2,CL:2,CO2:2,GLUCOSE:2,BUN:2,CREATININE:2,CALCIUM:2 in the last 72 hours  Studies/Results: No results found.  Assessment/Plan: The patient is doing well neurologically. I will add a Dilaudid PCA pump for pain management given the fact the patient has been on narcotics chronically and likely has a high tolerance to medications.  LOS: 0 days     Kaiulani Sitton D 06/22/2011, 12:16 PM

## 2011-06-23 ENCOUNTER — Ambulatory Visit (HOSPITAL_COMMUNITY): Payer: 59

## 2011-06-23 ENCOUNTER — Other Ambulatory Visit (HOSPITAL_COMMUNITY): Payer: 59

## 2011-06-23 MED ORDER — DEXAMETHASONE SODIUM PHOSPHATE 4 MG/ML IJ SOLN
4.0000 mg | Freq: Four times a day (QID) | INTRAMUSCULAR | Status: AC
  Administered 2011-06-23 (×4): 4 mg via INTRAVENOUS
  Filled 2011-06-23 (×3): qty 1

## 2011-06-23 MED ORDER — MORPHINE SULFATE 4 MG/ML IJ SOLN
4.0000 mg | Freq: Once | INTRAMUSCULAR | Status: AC
  Administered 2011-06-23: 4 mg via INTRAVENOUS

## 2011-06-23 MED ORDER — ONDANSETRON HCL 4 MG/2ML IJ SOLN: 4.0000 mg | Freq: Four times a day (QID) | INTRAMUSCULAR | Status: DC | PRN

## 2011-06-23 MED ORDER — NALOXONE HCL 0.4 MG/ML IJ SOLN: 0.4000 mg | INTRAMUSCULAR | Status: DC | PRN

## 2011-06-23 MED ORDER — MORPHINE SULFATE 2 MG/ML IJ SOLN
1.0000 mg | INTRAMUSCULAR | Status: DC | PRN
  Administered 2011-06-23 – 2011-06-25 (×4): 2 mg via INTRAVENOUS
  Administered 2011-06-25 – 2011-06-27 (×4): 4 mg via INTRAVENOUS
  Administered 2011-06-28 – 2011-06-29 (×3): 2 mg via INTRAVENOUS
  Administered 2011-07-01: 4 mg via INTRAVENOUS
  Filled 2011-06-23: qty 2
  Filled 2011-06-23 (×2): qty 1
  Filled 2011-06-23 (×2): qty 2
  Filled 2011-06-23: qty 1
  Filled 2011-06-23: qty 2
  Filled 2011-06-23 (×2): qty 1
  Filled 2011-06-23: qty 2
  Filled 2011-06-23 (×2): qty 1

## 2011-06-23 MED ORDER — DIPHENHYDRAMINE HCL 50 MG/ML IJ SOLN: 12.5000 mg | Freq: Four times a day (QID) | INTRAMUSCULAR | Status: DC | PRN

## 2011-06-23 MED ORDER — DIPHENHYDRAMINE HCL 12.5 MG/5ML PO ELIX
12.5000 mg | ORAL_SOLUTION | Freq: Four times a day (QID) | ORAL | Status: DC | PRN
  Filled 2011-06-23: qty 5

## 2011-06-23 MED ORDER — SODIUM CHLORIDE 0.9 % IJ SOLN: 9.0000 mL | INTRAMUSCULAR | Status: DC | PRN

## 2011-06-23 MED ORDER — HYDROMORPHONE 0.3 MG/ML IV SOLN
INTRAVENOUS | Status: DC
  Administered 2011-06-23: 4.8 mg via INTRAVENOUS
  Administered 2011-06-23: 2.34 mg via INTRAVENOUS
  Administered 2011-06-23 (×2): 7.5 mg via INTRAVENOUS
  Administered 2011-06-24: 3.7 mg via INTRAVENOUS
  Administered 2011-06-24: 4.5 mg via INTRAVENOUS
  Administered 2011-06-24: 7.5 mg via INTRAVENOUS
  Administered 2011-06-24: 1.5 mg via INTRAVENOUS
  Administered 2011-06-24: 2.7 mg via INTRAVENOUS
  Administered 2011-06-25: 4.5 mg via INTRAVENOUS
  Administered 2011-06-25: 2.7 mg via INTRAVENOUS
  Administered 2011-06-25: 7.5 mg via INTRAVENOUS
  Administered 2011-06-25: 4.8 mg via INTRAVENOUS
  Administered 2011-06-25 (×2): 7.5 mg via INTRAVENOUS
  Administered 2011-06-25: 4.8 mg via INTRAVENOUS
  Administered 2011-06-26: 3.9 mg via INTRAVENOUS
  Filled 2011-06-23 (×6): qty 25

## 2011-06-23 MED ORDER — MORPHINE SULFATE 4 MG/ML IJ SOLN
INTRAMUSCULAR | Status: AC
  Filled 2011-06-23: qty 1

## 2011-06-23 MED ORDER — MORPHINE BOLUS VIA INFUSION
1.0000 mg | INTRAVENOUS | Status: DC | PRN
  Filled 2011-06-23: qty 5

## 2011-06-23 MED FILL — Ketorolac Tromethamine Inj 30 MG/ML: INTRAMUSCULAR | Qty: 1 | Status: AC

## 2011-06-23 NOTE — Progress Notes (Signed)
Subjective:  The patient is alert and pleasant. He looks well. He complains of cramping and soreness in his right leg diffusely. He feels better than yesterday.  Objective: Vital signs in last 24 hours: Temp:  [97.4 F (36.3 C)-98.5 F (36.9 C)] 97.7 F (36.5 C) (11/20 0400) Pulse Rate:  [54-71] 63  (11/20 0400) Resp:  [14-34] 18  (11/20 0400) BP: (94-137)/(44-82) 96/57 mmHg (11/20 0400) SpO2:  [94 %-100 %] 100 % (11/20 0400)  Intake/Output from previous day: 11/19 0701 - 11/20 0700 In: 3960 [P.O.:960; I.V.:3000] Out: 550 [Urine:350; Blood:200] Intake/Output this shift:    Physical exam the patient is alert and oriented x3. His motor strength is 5 over 5 in his bilateral gastrocnemius and extensor hallucis longus. I don't see any swelling or deformities of his right leg. He is diffusely tender to even light palpation of his right lower extremity.  Lab Results: No results found for this basename: WBC:2,HGB:2,HCT:2,PLT:2 in the last 72 hours BMET No results found for this basename: NA:2,K:2,CL:2,CO2:2,GLUCOSE:2,BUN:2,CREATININE:2,CALCIUM:2 in the last 72 hours  Studies/Results: Dg Thoracolumabar Spine  06/22/2011  *RADIOLOGY REPORT*  Clinical Data: T10-12 laminectomy and microdiskectomy.  THORACOLUMBAR SPINE - 2 VIEW  Comparison: Thoracic spine MR 03/23/2011.  Findings: Two intraoperative cross-table lateral views of the thoracolumbar spine are submitted.  The first film, taken at 0955 hours, shows a surgical instrument tip projecting anterior to the T11 vertebral body.  Multilevel endplate degenerative changes are seen in the spine.  The second film, taken at 1005 hours, shows a surgical instrument tip projecting in the soft tissues posterior to the T11-12 disc space.  IMPRESSION: Intraoperative visualization for T10-12 laminectomy and microdiskectomy.  Original Report Authenticated By: Jeff [name], M.D.    Assessment/Plan: Postop day #1: The patient is having some right leg  pain which is a bit perplexing. I don't see any signs of swelling or injury to his right leg. The pain is diffuse. It seems to be improving. I think he may benefit from Decadron for hospital inflammation. He is already on Neurontin. Eyes to physical therapy to see him as well.  LOS: 1 day     Jeff [name] D 06/23/2011, 7:41 AM

## 2011-06-23 NOTE — Progress Notes (Signed)
Physical Therapy Evaluation Patient Details Name: Jeff [name] MRN: [id number] DOB: 12-Jan-1961 Today's Date: 06/23/2011  Problem List:  Patient Active Problem List  Diagnoses  . Thoracic spinal stenosis    Past Medical History:  Past Medical History  Diagnosis Date  . Hypertension   . Tuberculosis     POSITIVE  TB SKIN TEST 1992.6 MTH [place]    Past Surgical History:  Past Surgical History  Procedure Date  . Cervical fusion   . Carpal tunnel release   . Knee arthroscopy     PT Assessment/Plan/Recommendation PT Assessment Clinical Impression Statement: Pt is a 50 y/o old male admitted s/p lumbar thoracic laminectomy and the below problem list.  Pt would benefit from PT acutely to maximize independence and facilitate d/c home. PT Recommendation/Assessment: Patient will need skilled PT in the acute care venue PT Problem List: Decreased activity tolerance;Decreased balance;Decreased mobility;Pain;Impaired sensation;Decreased knowledge of use of DME;Decreased knowledge of precautions Barriers to Discharge: None PT Therapy Diagnosis : Acute pain;Difficulty walking PT Plan PT Frequency: Min 5X/week PT Treatment/Interventions: DME instruction;Gait training;Stair training;Functional mobility training;Balance training;Patient/family education PT Recommendation Follow Up Recommendations: None Equipment Recommended: None recommended by PT PT Goals  Acute Rehab PT Goals PT Goal Formulation: With patient Time For Goal Achievement: 7 days Pt will go Supine/Side to Sit: with modified independence PT Goal: Supine/Side to Sit - Progress: Other (comment) (Set today.) Pt will go Sit to Supine/Side: with modified independence PT Goal: Sit to Supine/Side - Progress: Other (comment) (Set today.) Pt will Transfer Sit to Stand/Stand to Sit: with modified independence PT Transfer Goal: Sit to Stand/Stand to Sit - Progress: Other (comment) (Set today.) Pt will Ambulate: >150 feet;with modified  independence;with least restrictive assistive device PT Goal: Ambulate - Progress: Other (comment) (Set today.) Pt will Go Up / Down Stairs: 3-5 stairs;with supervision;with rail(s) PT Goal: Up/Down Stairs - Progress: Other (comment) (Set today.)  PT Evaluation Precautions/Restrictions  Precautions Precautions: Back Precaution Booklet Issued: Yes (comment) (Back handout given.) Required Braces or Orthoses: No Restrictions Weight Bearing Restrictions: No Prior Functioning  Home Living Lives With: Spouse Receives Help From: Family;Other (Comment) (Wife PRN.) Type of Home: House Home Layout: One level Home Access: Stairs to enter Entrance Stairs-Rails: Right;Left;Can reach both Entrance Stairs-Number of Steps: 4 Home Adaptive Equipment: Straight cane (Will borrow above straight cane from Dad.) Prior Function Level of Independence: Independent with basic ADLs;Independent with homemaking with ambulation;Independent with gait;Independent with transfers Able to Take Stairs?: Reciprically Driving: Yes Vocation: Full time employment Vocation Requirements: [employer] work at [employer], driving in trunk, supervising. Cognition Cognition Arousal/Alertness: Awake/alert Overall Cognitive Status: Appears within functional limits for tasks assessed Orientation Level: Oriented X4 Sensation/Coordination Sensation Light Touch: Impaired by gross assessment (Decreased in right LE and left thigh.) Stereognosis: Not tested Hot/Cold: Not tested Proprioception: Not tested Coordination Gross Motor Movements are Fluid and Coordinated: Yes Fine Motor Movements are Fluid and Coordinated: Not tested Extremity Assessment RUE Assessment RUE Assessment: Within Functional Limits LUE Assessment LUE Assessment: Within Functional Limits RLE Assessment RLE Assessment: Within Functional Limits (Limited by pain only.) LLE Assessment LLE Assessment: Within Functional Limits Pain 8/10 pain in right LE  with treatment.  RN aware and reports pt was premedicated.  Pt also repositioned after treatment. Mobility (including Balance) Bed Mobility Bed Mobility: Yes Rolling Right: 6: Modified independent (Device/Increase time);With rail Rolling Left: 6: Modified independent (Device/Increase time);With rail Right Sidelying to Sit: 5: Supervision;With rails;HOB elevated (comment degrees) (30 degrees.) Right Sidelying to Sit Details (indicate  cue type and reason): Verbal cues for sequence to follow back precautions. Sit to Supine - Right: 5: Supervision;With rail;HOB elevated (comment degrees) (30 degrees.) Sit to Supine - Right Details (indicate cue type and reason): Verbal cues for sequence to follow back precautions. Transfers Transfers: Yes Sit to Stand: 5: Supervision;From bed Sit to Stand Details (indicate cue type and reason): Verbal cues for safest hand placement. Stand to Sit: 5: Supervision;To bed Stand to Sit Details: Verbal cues for safest hand placement. Ambulation/Gait Ambulation/Gait: Yes Ambulation/Gait Assistance: 4: Min assist Ambulation/Gait Assistance Details (indicate cue type and reason): Assist for balance secondary to decreased weight shift right with decreased stance time right.  + Antalgic gait secondary to right LE pain. Ambulation Distance (Feet): 200 Feet Assistive device: Other (Comment) (Pt using rail in hall intermittently.) Gait Pattern: Decreased stance time - right;Decreased step length - right;Decreased weight shift to right;Antalgic Stairs: No Wheelchair Mobility Wheelchair Mobility: No  Posture/Postural Control Posture/Postural Control: No significant limitations Balance Balance Assessed: No Exercise    End of Session PT - End of Session Equipment Utilized During Treatment: Gait belt Activity Tolerance: Patient tolerated treatment well Patient left: in bed;with call bell in reach Nurse Communication: Mobility status for transfers;Mobility status for  ambulation General Behavior During Session: [facility] for tasks performed Cognition: [facility] for tasks performed  Cephus Shelling 06/23/2011, 8:37 AM  06/23/2011 Cephus Shelling, PT, DPT [phone]

## 2011-06-23 NOTE — Progress Notes (Signed)
Pt c/o severe pain in R leg.  Pain occurs while lying still and is aggravated by movement.  There is no apparent swelling or warmth of leg.  Back incision is clean dry and intact.  There appears to be no swelling at incision site.  There is a small dime sized amount of old drainage on dsg.  Pt has been given all prn pain medications and K-pad; however, pt still c/o 10 out of 10 pain and crying.  Dr. Lovell Sheehan notified.  No orders given.  Will continue to monitor.

## 2011-06-24 ENCOUNTER — Encounter (HOSPITAL_COMMUNITY): Payer: Self-pay | Admitting: Neurosurgery

## 2011-06-24 MED ORDER — GABAPENTIN 400 MG PO CAPS
800.0000 mg | ORAL_CAPSULE | Freq: Three times a day (TID) | ORAL | Status: DC
  Administered 2011-06-24 – 2011-07-02 (×25): 800 mg via ORAL
  Filled 2011-06-24 (×16): qty 2
  Filled 2011-06-24: qty 1
  Filled 2011-06-24 (×13): qty 2

## 2011-06-24 MED ORDER — DIAZEPAM 5 MG PO TABS
10.0000 mg | ORAL_TABLET | Freq: Four times a day (QID) | ORAL | Status: DC | PRN
  Administered 2011-06-25 – 2011-06-26 (×3): 10 mg via ORAL
  Administered 2011-06-27: 5 mg via ORAL
  Administered 2011-06-27 – 2011-07-05 (×10): 10 mg via ORAL
  Filled 2011-06-24 (×5): qty 2
  Filled 2011-06-24: qty 1
  Filled 2011-06-24: qty 2
  Filled 2011-06-24: qty 1
  Filled 2011-06-24 (×4): qty 2
  Filled 2011-06-24: qty 1
  Filled 2011-06-24: qty 2
  Filled 2011-06-24 (×2): qty 1

## 2011-06-24 NOTE — Progress Notes (Signed)
Late Note  Pt still crying and c/o 10 out of 10 pain.  Dr/ Lovell Sheehan notified orders given for one time dose of IV Morphine, MRI of thoracic and lumbar spine, xray of pelvis and hip, and to restart full dose Diluadid pca once pt arrived back from scans.   Radiology notified us that they would not be able to get to him until 1630.  MD notified and gave orders to go ahead and start pca.  Pt still c/o 10/10 pain in R leg but he was not crying and pain was more bearable.

## 2011-06-24 NOTE — Progress Notes (Signed)
Subjective:  Patient complains of severe right leg. He states the pain is worse even with the slightest of patient or movement of his leg.  Objective: Vital signs in last 24 hours: Temp:  [97.3 F (36.3 C)-97.9 F (36.6 C)] 97.4 F (36.3 C) (11/21 1600) Pulse Rate:  [65-83] 83  (11/21 1600) Resp:  [16-20] 18  (11/21 1600) BP: (107-130)/(67-87) 129/87 mmHg (11/21 1600) SpO2:  [98 %-100 %] 100 % (11/21 1600)  Intake/Output from previous day: 11/20 0701 - 11/21 0700 In: 1200 [P.O.:1200] Out: -  Intake/Output this shift: Total I/O In: 720 [P.O.:720] Out: -   Physical exam there is no evidence of swelling or deformity of his right leg.  Lab Results: No results found for this basename: WBC:2,HGB:2,HCT:2,PLT:2 in the last 72 hours BMET No results found for this basename: NA:2,K:2,CL:2,CO2:2,GLUCOSE:2,BUN:2,CREATININE:2,CALCIUM:2 in the last 72 hours  Studies/Results: Dg Hip Bilateral W/pelvis  06/23/2011  *RADIOLOGY REPORT*  Clinical Data: Right greater than left leg pain.  BILATERAL HIP WITH PELVIS - 4+ VIEW  Comparison: None.  Findings: No displaced acute fracture or dislocation identified. No aggressive appearing osseous lesion.  Mild right hip DJD.  SI joints are intact.  Sacrum is partially obscured by overlying bowel gas.  IMPRESSION: No acute osseous abnormality.  Original Report Authenticated By: Waneta Martins, M.D.   Mr Thoracic Spine Wo Contrast  06/24/2011  *RADIOLOGY REPORT*  Clinical Data: Extreme back pain and right leg pain with numbness and burning.  MRI THORACIC SPINE WITHOUT CONTRAST  Technique:  Multiplanar and multiecho pulse sequences of the thoracic spine were obtained without intravenous contrast.  Comparison: MRI dated 03/23/2011  Findings: Scan extends from C7-T1 through T12-L1.  Since the prior exam the patient has had posterior decompression surgery at T10-11 and T11-12 with resection of the spinous processes and lamina at T10 and T11.  There is excellent  decompression of the thecal sac.  The small disc protrusion at T11 12 to the left of midline is unchanged but there is no longer compression of the spinal cord.  The surgical site demonstrates only minimal residual edema and scarring.  The remainder of the thoracic spine appears normal.  Thoracic spinal cord is normal.  No foraminal or spinal stenosis.  No facet joint disease.  IMPRESSION:  1.  Postsurgical changes at T10-11 and T11-12 with excellent decompression of the spinal cord and thecal sac.  No change in the small soft disc protrusion at T11-12 but there is no longer compression of the spinal cord. 2.  Otherwise, normal exam.  Original Report Authenticated By: Gwynn Burly, M.D.   Mr Lumbar Spine Wo Contrast  06/24/2011  *RADIOLOGY REPORT*  Clinical Data: Extreme postoperative low back pain with right leg pain and numbness.  MRI LUMBAR SPINE WITHOUT CONTRAST  Technique:  Multiplanar and multiecho pulse sequences of the lumbar spine were obtained without intravenous contrast.  Comparison: MRI of the thoracic spine dated 03/23/2011  Findings: The scan extends from T10-11 through S2.  Tip of the conus is at T12 and appears normal. The paraspinal soft tissues are normal.  T10-11 and T11-12:  There has been posterior decompression surgery with resection of the lamina and spinous processes at T10 and T11. The thecal sac and spinal canal are well decompressed.  Small disc protrusion to the left of midline no longer compresses the spinal cord.  No visible neural impingement at either level.  Expected minimal scarring at the operative site.  T12-L1 through L4-5:  No significant abnormalities.  No disc bulging or protrusions or facet joint disease.  No spinal or foraminal stenosis.  L5-L1:  Tiny central annular tear and tiny central disc bulge minimally asymmetric to the left without impingement upon the nerve roots or thecal sac.  IMPRESSION:  1.  Minimal degenerative disc disease at L5-S1 with a small  central annular tear and tiny disc bulge without impingement. 2.  Excellent decompression of the thecal sac and spinal cord at T10-11 and T11-12 with no significant residual impingement. 3.  No findings to explain this patient's severe low back pain and right leg pain.  Original Report Authenticated By: Gwynn Burly, M.D.    Assessment/Plan: Right leg pain: The patient's thoracic and lumbar MRI demonstrated no explanation for patient's right leg pain. His hip x-rays look fine. On going to increase his Neurontin.  LOS: 2 days     Britt Petroni D 06/24/2011, 5:50 PM

## 2011-06-24 NOTE — Progress Notes (Signed)
Utilization review completed. Maryjean Corpening, RN, BSN. 06/24/11 

## 2011-06-24 NOTE — Progress Notes (Signed)
Physical Therapy Treatment Patient Details Name: TAYLER LASSEN MRN: [id number] DOB: 1961/01/03 Today's Date: 06/24/2011  PT Assessment/Plan  PT - Assessment/Plan Comments on Treatment Session: Pt moves well despite significant pain in right leg.  Pt's wife present entire session. PT Plan: Discharge plan remains appropriate PT Frequency: Min 5X/week Follow Up Recommendations: None Equipment Recommended: None recommended by PT PT Goals  Acute Rehab PT Goals PT Goal: Supine/Side to Sit - Progress: Met PT Transfer Goal: Sit to Stand/Stand to Sit - Progress: Met PT Goal: Ambulate - Progress: Progressing toward goal  PT Treatment Precautions/Restrictions  Precautions Precautions: Back Precaution Booklet Issued: Yes (comment) (Back handout given.) Precaution Comments: Pt unable to recall any of the 3 back precautions- Reviewed all 3 precautions with pt/pt's wife Required Braces or Orthoses: No Restrictions Weight Bearing Restrictions: No Mobility (including Balance) Bed Mobility Rolling Right: 6: Modified independent (Device/Increase time);With rail Right Sidelying to Sit: HOB flat;With rails;6: Modified independent (Device/Increase time) Right Sidelying to Sit Details (indicate cue type and reason): increased time due to pain Transfers Sit to Stand: 6: Modified independent (Device/Increase time);From bed Stand to Sit: 6: Modified independent (Device/Increase time);To chair/3-in-1 Ambulation/Gait Ambulation/Gait Assistance:  Tree surgeon) Ambulation/Gait Assistance Details (indicate cue type and reason): Guarding for safety due to pain in right leg.   Ambulation Distance (Feet): 200 Feet Assistive device: Straight cane Gait Pattern: Step-through pattern;Antalgic;Decreased stance time - right;Decreased weight shift to right    Exercise    End of Session PT - End of Session Equipment Utilized During Treatment: Gait belt Activity Tolerance: Patient limited by pain Patient  left: in chair General Behavior During Session: [facility] for tasks performed Cognition: [facility] for tasks performed  [name] 06/24/2011, 1:27 PM [phone]

## 2011-06-25 MED ORDER — HYDROMORPHONE 0.3 MG/ML IV SOLN
INTRAVENOUS | Status: AC
  Administered 2011-06-25: 7.5 mg via INTRAVENOUS
  Filled 2011-06-25: qty 25

## 2011-06-25 MED ORDER — HYDROMORPHONE 0.3 MG/ML IV SOLN
INTRAVENOUS | Status: AC
  Administered 2011-06-25: 3.9 mg via INTRAVENOUS
  Filled 2011-06-25: qty 25

## 2011-06-25 NOTE — Progress Notes (Signed)
Subjective: Patient reports Overall Mr. Hulen Skains is unchanged. He still reports intense dysesthetic pain in the right lower extremity he does state he is able to walk on it. The worst pain is when somebody touches any part of his leg or foot. He is very reluctant to move it is very difficult to US examine the strength in that leg. It doesn't seem to follow a single nerve root distribution he does seem to be worse in the posterior hamstrings and thigh and over his entire foot. Reportedly per Dr. Lovell Sheehan all radiography was unremarkable.  Objective: Vital signs in last 24 hours: Temp:  [97.3 F (36.3 C)-98.3 F (36.8 C)] 97.7 F (36.5 C) (11/22 1003) Pulse Rate:  [71-88] 73  (11/22 1003) Resp:  [16-24] 16  (11/22 1024) BP: (107-139)/(66-87) 133/67 mmHg (11/22 1003) SpO2:  [95 %-100 %] 95 % (11/22 1024)  Intake/Output from previous day: 11/21 0701 - 11/22 0700 In: 720 [P.O.:720] Out: -  Intake/Output this shift:    Very difficult to assess strength in his right lower extremity. Secondary pain. He does appear to move and he does say he's able to ambulate on it. Left lower extremity is unremarkable. He has no new numbness or tingling anywhere else. And he is getting some relief from the steroids and Neurontin.  Lab Results: No results found for this basename: WBC:2,HGB:2,HCT:2,PLT:2 in the last 72 hours BMET No results found for this basename: NA:2,K:2,CL:2,CO2:2,GLUCOSE:2,BUN:2,CREATININE:2,CALCIUM:2 in the last 72 hours  Studies/Results: Dg Hip Bilateral W/pelvis  06/23/2011  *RADIOLOGY REPORT*  Clinical Data: Right greater than left leg pain.  BILATERAL HIP WITH PELVIS - 4+ VIEW  Comparison: None.  Findings: No displaced acute fracture or dislocation identified. No aggressive appearing osseous lesion.  Mild right hip DJD.  SI joints are intact.  Sacrum is partially obscured by overlying bowel gas.  IMPRESSION: No acute osseous abnormality.  Original Report Authenticated By: Waneta Martins, M.D.   Mr Thoracic Spine Wo Contrast  06/24/2011  *RADIOLOGY REPORT*  Clinical Data: Extreme back pain and right leg pain with numbness and burning.  MRI THORACIC SPINE WITHOUT CONTRAST  Technique:  Multiplanar and multiecho pulse sequences of the thoracic spine were obtained without intravenous contrast.  Comparison: MRI dated 03/23/2011  Findings: Scan extends from C7-T1 through T12-L1.  Since the prior exam the patient has had posterior decompression surgery at T10-11 and T11-12 with resection of the spinous processes and lamina at T10 and T11.  There is excellent decompression of the thecal sac.  The small disc protrusion at T11 12 to the left of midline is unchanged but there is no longer compression of the spinal cord.  The surgical site demonstrates only minimal residual edema and scarring.  The remainder of the thoracic spine appears normal.  Thoracic spinal cord is normal.  No foraminal or spinal stenosis.  No facet joint disease.  IMPRESSION:  1.  Postsurgical changes at T10-11 and T11-12 with excellent decompression of the spinal cord and thecal sac.  No change in the small soft disc protrusion at T11-12 but there is no longer compression of the spinal cord. 2.  Otherwise, normal exam.  Original Report Authenticated By: Gwynn Burly, M.D.   Mr Lumbar Spine Wo Contrast  06/24/2011  *RADIOLOGY REPORT*  Clinical Data: Extreme postoperative low back pain with right leg pain and numbness.  MRI LUMBAR SPINE WITHOUT CONTRAST  Technique:  Multiplanar and multiecho pulse sequences of the lumbar spine were obtained without intravenous contrast.  Comparison: MRI  of the thoracic spine dated 03/23/2011  Findings: The scan extends from T10-11 through S2.  Tip of the conus is at T12 and appears normal. The paraspinal soft tissues are normal.  T10-11 and T11-12:  There has been posterior decompression surgery with resection of the lamina and spinous processes at T10 and T11. The thecal sac and  spinal canal are well decompressed.  Small disc protrusion to the left of midline no longer compresses the spinal cord.  No visible neural impingement at either level.  Expected minimal scarring at the operative site.  T12-L1 through L4-5:  No significant abnormalities.  No disc bulging or protrusions or facet joint disease.  No spinal or foraminal stenosis.  L5-L1:  Tiny central annular tear and tiny central disc bulge minimally asymmetric to the left without impingement upon the nerve roots or thecal sac.  IMPRESSION:  1.  Minimal degenerative disc disease at L5-S1 with a small central annular tear and tiny disc bulge without impingement. 2.  Excellent decompression of the thecal sac and spinal cord at T10-11 and T11-12 with no significant residual impingement. 3.  No findings to explain this patient's severe low back pain and right leg pain.  Original Report Authenticated By: Gwynn Burly, M.D.    Assessment/Plan: Continue the steroids and Neurontin and physical therapy.  LOS: 3 days     Virl Coble P 06/25/2011, 10:44 AM

## 2011-06-25 NOTE — Progress Notes (Signed)
Physical Therapy Treatment Patient Details Name: Jeff [name] MRN: [id number] DOB: 08-Sep-1960 Today's Date: 06/25/2011  PT Assessment/Plan  PT - Assessment/Plan Comments on Treatment Session: patient progressing well with mobitlity; gait velocity is significantly decreased and I am concerned about level of pain in right foot.  Will decrease frequency to 3x/week and continue to address gait velocity. PT Plan: Discharge plan needs to be updated PT Frequency: Min 3X/week Follow Up Recommendations: Outpatient PT Equipment Recommended: None recommended by PT PT Goals  Acute Rehab PT Goals PT Goal: Supine/Side to Sit - Progress: Met PT Goal: Sit to Supine/Side - Progress: Progressing toward goal PT Transfer Goal: Sit to Stand/Stand to Sit - Progress: Met PT Goal: Ambulate - Progress: Met PT Goal: Up/Down Stairs - Progress: Met Additional Goals Additional Goal #1: patient will ambulate 100 feet at normal gait velocity, 4.4 ft/sec PT Goal: Additional Goal #1 - Progress: Other (comment)  PT Treatment Precautions/Restrictions  Precautions Precautions: Back Precaution Booklet Issued: Yes (comment) (Back handout given.) Precaution Comments: reviewed back precautions; able to state no bending and no twisting Required Braces or Orthoses: No Restrictions Weight Bearing Restrictions: No Mobility (including Balance) Bed Mobility Rolling Right: 6: Modified independent (Device/Increase time);With rail Rolling Left: 6: Modified independent (Device/Increase time);With rail Sit to Supine - Right: 6: Modified independent (Device/Increase time);With rail;HOB flat Transfers Sit to Stand: 6: Modified independent (Device/Increase time);From bed Stand to Sit: 6: Modified independent (Device/Increase time);To chair/3-in-1 Ambulation/Gait Ambulation/Gait Assistance: 6: Modified independent (Device/Increase time) Ambulation/Gait Assistance Details (indicate cue type and reason): patient ambulated with  right foot turned out and slightly abducted; patient complained of pain in RLE Ambulation Distance (Feet): 150 Feet Assistive device: Straight cane Gait Pattern: Decreased stance time - right;Step-to pattern Stairs: Yes Stairs Assistance: 6: Modified independent (Device/Increase time) Stair Management Technique: Two rails Number of Stairs: 4   Posture/Postural Control Posture/Postural Control: No significant limitations Exercise    End of Session PT - End of Session Activity Tolerance: Patient tolerated treatment well Patient left: in chair General Behavior During Session: [facility] for tasks performed Cognition: [facility] for tasks performed  Olivia Canter 06/25/2011, 11:39 AM 06/25/2011 Olivia Canter, PT [phone]

## 2011-06-26 MED ORDER — HYDROMORPHONE 0.3 MG/ML IV SOLN
INTRAVENOUS | Status: AC
  Administered 2011-06-26: 2.4 mg via INTRAVENOUS
  Filled 2011-06-26: qty 25

## 2011-06-26 MED ORDER — SODIUM CHLORIDE 0.9 % IV BOLUS (SEPSIS)
1000.0000 mL | Freq: Once | INTRAVENOUS | Status: AC
  Administered 2011-06-26: 1000 mL via INTRAVENOUS

## 2011-06-26 MED ORDER — HYDROMORPHONE HCL PF 1 MG/ML IJ SOLN
1.0000 mg | INTRAMUSCULAR | Status: DC | PRN
  Administered 2011-06-26 – 2011-07-02 (×26): 1 mg via INTRAVENOUS
  Filled 2011-06-26 (×27): qty 1

## 2011-06-26 NOTE — Progress Notes (Signed)
Subjective: Patient reports some episodic times of improvement in that right lower extremity pain. No left lower extremity pain. Medication seems to be controlling the back pain. Her right leg when it severe is unchanged. Although he is having more frequent episodes were doesn't bother him.  Objective: Vital signs in last 24 hours: Temp:  [97.4 F (36.3 C)-98.3 F (36.8 C)] 98.3 F (36.8 C) (11/23 0600) Pulse Rate:  [68-76] 68  (11/23 0600) Resp:  [16-20] 18  (11/23 0600) BP: (80-133)/(42-82) 85/50 mmHg (11/23 0700) SpO2:  [94 %-98 %] 97 % (11/23 0600)  Intake/Output from previous day:   Intake/Output this shift:    No change in exam with pain limited weakness of his  right lower extremity.  Lab Results: No results found for this basename: WBC:2,HGB:2,HCT:2,PLT:2 in the last 72 hours BMET No results found for this basename: NA:2,K:2,CL:2,CO2:2,GLUCOSE:2,BUN:2,CREATININE:2,CALCIUM:2 in the last 72 hours  Studies/Results: No results found.  Assessment/Plan: Patient does seem to be making some improvement. We'll continue to steroids, the Neurontin, I discontinued the PCA because his blood pressure was low in the mid 80s. I a bolus of 500 cc of normal saline. And written him for back up dilaudid injections for breakthrough pain.  LOS: 4 days     Lenon Kuennen P 06/26/2011, 9:48 AM

## 2011-06-27 MED ORDER — OXYCODONE HCL 20 MG PO TB12
20.0000 mg | ORAL_TABLET | ORAL | Status: AC
  Administered 2011-06-27: 20 mg via ORAL
  Filled 2011-06-27: qty 1

## 2011-06-27 MED ORDER — PREGABALIN 50 MG PO CAPS
50.0000 mg | ORAL_CAPSULE | Freq: Three times a day (TID) | ORAL | Status: DC
  Administered 2011-06-27 – 2011-06-28 (×3): 50 mg via ORAL
  Filled 2011-06-27 (×3): qty 1

## 2011-06-27 MED ORDER — PREGABALIN 50 MG PO CAPS: 50.0000 mg | ORAL_CAPSULE | Freq: Three times a day (TID) | ORAL | Status: DC

## 2011-06-27 MED ORDER — OXYCODONE HCL 40 MG PO TB12
40.0000 mg | ORAL_TABLET | Freq: Two times a day (BID) | ORAL | Status: DC
  Administered 2011-06-27 – 2011-06-28 (×2): 40 mg via ORAL
  Filled 2011-06-27 (×2): qty 1

## 2011-06-27 MED ORDER — OXYCODONE HCL 20 MG PO TB12: 20.0000 mg | ORAL_TABLET | ORAL | Status: DC

## 2011-06-27 NOTE — Progress Notes (Signed)
Subjective:  The patient is alert and pleasant. He looks much better today. He still complains of severe right leg pain. But is presently in no apparent distress.  Objective: Vital signs in last 24 hours: Temp:  [97.8 F (36.6 C)-98.6 F (37 C)] 97.8 F (36.6 C) (11/23 2231) Pulse Rate:  [62-81] 81  (11/23 2231) Resp:  [18-20] 20  (11/23 2231) BP: (108-144)/(66-89) 135/88 mmHg (11/23 2231) SpO2:  [91 %-99 %] 99 % (11/23 2231)  Intake/Output from previous day: 11/23 0701 - 11/24 0700 In: 960 [P.O.:960] Out: 200 [Urine:200] Intake/Output this shift:    Physical exam the patient's right lower extremity is without deformity or obvious edema.  Neurologic exam: The patient is alert and oriented. He guards his right lower extremity making accurate strength determination difficult. His gastrocnemius strength on the right is at least 4/5.  Lab Results: No results found for this basename: WBC:2,HGB:2,HCT:2,PLT:2 in the last 72 hours BMET No results found for this basename: NA:2,K:2,CL:2,CO2:2,GLUCOSE:2,BUN:2,CREATININE:2,CALCIUM:2 in the last 72 hours  Studies/Results: No results found.  Assessment/Plan: Right leg pain: The patient has been on OxyContin chronically. He likely has a bit of a tolerance. I am going to increase his OxyContin dose. I am also going to and Lyrica. I have encouraged him to ambulate.  LOS: 5 days     Kealohilani Maiorino D 06/27/2011, 10:17 AM

## 2011-06-28 MED ORDER — PREGABALIN 75 MG PO CAPS
75.0000 mg | ORAL_CAPSULE | Freq: Three times a day (TID) | ORAL | Status: DC
  Administered 2011-06-28 – 2011-07-01 (×9): 75 mg via ORAL
  Filled 2011-06-28 (×10): qty 1

## 2011-06-28 MED ORDER — OXYCODONE HCL 40 MG PO TB12
60.0000 mg | ORAL_TABLET | Freq: Two times a day (BID) | ORAL | Status: DC
  Administered 2011-06-28 – 2011-07-11 (×25): 60 mg via ORAL
  Filled 2011-06-28: qty 2
  Filled 2011-06-28 (×3): qty 1
  Filled 2011-06-28: qty 2
  Filled 2011-06-28 (×16): qty 1
  Filled 2011-06-28: qty 2
  Filled 2011-06-28 (×7): qty 1

## 2011-06-28 NOTE — Progress Notes (Signed)
Patient has excruciating pain at both of his leg ( back)  After transferring from chair to bed.  Will continue to monitor

## 2011-06-28 NOTE — Progress Notes (Signed)
Patient ID: Jeff [name], male   DOB: 13-May-1961, 50 y.o.   MRN: [id number] Subjective:  The patient is alert and pleasant. He looks and feels better today. He still has episodes of right leg pain. He has been ambulating to the restroom.  Objective: Vital signs in last 24 hours: Temp:  [97.5 F (36.4 C)-98.3 F (36.8 C)] 97.8 F (36.6 C) (11/25 0600) Pulse Rate:  [78-83] 81  (11/25 0600) Resp:  [19-20] 20  (11/25 0600) BP: (110-126)/(65-79) 112/70 mmHg (11/25 0600) SpO2:  [92 %-96 %] 96 % (11/25 0600)  Intake/Output from previous day: 11/24 0701 - 11/25 0700 In: 9314.3 [P.O.:1180; I.V.:8134.3] Out: 1200 [Urine:1200] Intake/Output this shift:    Physical exam the patient is alert and oriented. His motor strength is 4+ over 5 in his right gastrocnemius with largely give away weakness to pain.  Lab Results: No results found for this basename: WBC:2,HGB:2,HCT:2,PLT:2 in the last 72 hours BMET No results found for this basename: NA:2,K:2,CL:2,CO2:2,GLUCOSE:2,BUN:2,CREATININE:2,CALCIUM:2 in the last 72 hours  Studies/Results: No results found.  Assessment/Plan: Postop day #6: The patient's neuropathic pain has improved but is still present. I'm going to increase his OxyContin and Lyrica. I will hopefully be able to discharge him in the next day or 2.  LOS: 6 days     Jaleiyah Alas D 06/28/2011, 9:53 AM

## 2011-06-29 ENCOUNTER — Ambulatory Visit (HOSPITAL_COMMUNITY): Payer: 59

## 2011-06-29 MED ORDER — ALBUTEROL SULFATE (5 MG/ML) 0.5% IN NEBU
2.5000 mg | INHALATION_SOLUTION | Freq: Once | RESPIRATORY_TRACT | Status: AC
  Administered 2011-06-29: 2.5 mg via RESPIRATORY_TRACT
  Filled 2011-06-29: qty 0.5

## 2011-06-29 NOTE — Progress Notes (Signed)
Patient ID: Jeff [name], male   DOB: 07-18-1961, 50 y.o.   MRN: [id number] Subjective:  The patient is alert and pleasant. He is accompanied by his wife and daughter. He says he feels fine now. States that when he stands he gets pain that radiates from his right foot, up his right leg, across his pelvis, and down his left leg. He has ambulated the hallways with physical therapy.  Objective: Vital signs in last 24 hours: Temp:  [97.4 F (36.3 C)-99 F (37.2 C)] 99 F (37.2 C) (11/26 1405) Pulse Rate:  [75-86] 81  (11/26 1405) Resp:  [17-20] 20  (11/26 1405) BP: (105-126)/(65-77) 105/72 mmHg (11/26 1405) SpO2:  [94 %-98 %] 98 % (11/26 1405) FiO2 (%):  [28 %] 28 % (11/26 1142)  Intake/Output from previous day: 11/25 0701 - 11/26 0700 In: 300 [I.V.:300] Out: 2000 [Urine:2000] Intake/Output this shift: Total I/O In: 240 [P.O.:240] Out: -   Physical exam the patient is alert and oriented. He is in no apparent stress. His motor strength is grossly normal his bilateral gastrocnemius  His lower extremity exam demonstrates no deformities edema swelling etc.  Lab Results: No results found for this basename: WBC:2,HGB:2,HCT:2,PLT:2 in the last 72 hours BMET No results found for this basename: NA:2,K:2,CL:2,CO2:2,GLUCOSE:2,BUN:2,CREATININE:2,CALCIUM:2 in the last 72 hours  Studies/Results: Dg Chest 2 View  06/29/2011  *RADIOLOGY REPORT*  Clinical Data: Wheezing.  Oxygen saturation fluctuation.  Shortness of breath.  CHEST - 2 VIEW  Comparison: 08/01/2011  Findings: Patient has had prior cervical fusion.  The film is made with shallow lung inflation, accentuating heart size.  The lungs are free of focal consolidations and pleural effusions.  There are no focal consolidations or pleural effusions. Visualized osseous structures have a normal appearance.  IMPRESSION: No evidence for acute cardiopulmonary abnormality.  Original Report Authenticated By: Patterson Hammersmith, M.D.     Assessment/Plan: Postop day #7: The patient seems to be improving. I am going to continue his current management. I encouraged him to take mainly by mouth pain medications. I answered all the patient and his wife's questions.  LOS: 7 days     Amaury Kuzel D 06/29/2011, 5:35 PM

## 2011-06-29 NOTE — Progress Notes (Signed)
Utilization review completed. Talulah Schirmer, RN, BSN. 06/29/11  

## 2011-06-29 NOTE — Progress Notes (Signed)
Physical Therapy Treatment Patient Details Name: Jeff [name] MRN: [id number] DOB: 1960/08/17 Today's Date: 06/29/2011  PT Assessment/Plan  PT - Assessment/Plan Comments on Treatment Session: Pt very lethargic today.  RN states pt had significant amounts of pain meds throughout night/early AM.  Pt continues to have a lot of pain in right leg & ocassionally in left leg.  Pt with onset of swelling in both feet/hands & wheezing noted when returned to bed at end of session- RN present & aware.    PT Plan: Discharge plan remains appropriate PT Frequency: Min 3X/week Follow Up Recommendations: Outpatient PT Equipment Recommended: None recommended by PT PT Goals  Acute Rehab PT Goals PT Goal: Supine/Side to Sit - Progress: Other (comment) (required increased (A) today) PT Goal: Sit to Supine/Side - Progress: Other (comment) (required increased (A) today) PT Transfer Goal: Sit to Stand/Stand to Sit - Progress: Other (comment) (required increased (A) today) PT Goal: Ambulate - Progress: Other (comment) (required increased (A) today)  PT Treatment Precautions/Restrictions  Precautions Precautions: Back Precaution Booklet Issued: Yes (comment) (Back handout given.) Precaution Comments: Pt able to verbalize 3/3 back precautions but required cueing for reinforcement of technique with mobility.   Required Braces or Orthoses: No Restrictions Weight Bearing Restrictions: No Mobility (including Balance) Bed Mobility Rolling Left: 5: Supervision Rolling Left Details (indicate cue type and reason): cues to reinforce back precautions & technique- pt attempting to go straight from supine to sit Left Sidelying to Sit: 5: Supervision Left Sidelying to Sit Details (indicate cue type and reason): cues for sequencing, technique, & reinforcement of precautions Sit to Supine - Right: 4: Min assist Sit to Supine - Right Details (indicate cue type and reason): assistance to lift LE's onto bed & prevent  twisting.  Cues for sequencing/technique Transfers Sit to Stand: Other (comment);From bed;From chair/3-in-1;With upper extremity assist;With armrests (min guard assistance) Sit to Stand Details (indicate cue type and reason): close guarding due to lethargy & pain. cues to reinforce technique/back precautions.   Stand to Sit: Other (comment) (min guard assistance) Stand to Sit Details: cues for hand placement & technique Ambulation/Gait Ambulation/Gait Assistance: Other (comment) (min guard assist) Ambulation/Gait Assistance Details (indicate cue type and reason): close guarding due to lethargy & pain.  Pt c/o of dizziness after ~60'.  Chair brought up behind pt to sit & BP monitored (118/77)   Ambulation Distance (Feet): 60 Feet Assistive device: Rolling walker Gait Pattern: Decreased step length - left;Step-through pattern;Decreased stride length;Decreased stance time - right Stairs: No    Exercise    End of Session PT - End of Session Equipment Utilized During Treatment: Gait belt Activity Tolerance: Patient limited by pain;Other (comment) (pt also very lethargic) Patient left: in bed;with call bell in reach;with family/visitor present General Behavior During Session: Lethargic Cognition: Impaired.   Pt with decreased alertness/orientation at end of session.  Slow to respond to questions, eyes staying closed, unable to answer "year/place/month" questions appropriately.    [name] 06/29/2011, 3:17 PM [phone]

## 2011-06-30 DIAGNOSIS — M7989 Other specified soft tissue disorders: Secondary | ICD-10-CM

## 2011-06-30 DIAGNOSIS — M79609 Pain in unspecified limb: Secondary | ICD-10-CM

## 2011-06-30 NOTE — Progress Notes (Signed)
Patient ID: Jeff [name], male   DOB: 1961-02-27, 50 y.o.   MRN: [id number] Subjective:  The patient is resting comfortably. When awoken he complains that he had a bad night with a lot of bilateral leg pain.  Objective: Vital signs in last 24 hours: Temp:  [97.4 F (36.3 C)-99 F (37.2 C)] 97.9 F (36.6 C) (11/27 0200) Pulse Rate:  [77-87] 87  (11/27 0200) Resp:  [18-20] 18  (11/27 0200) BP: (102-118)/(67-77) 102/67 mmHg (11/27 0200) SpO2:  [94 %-98 %] 95 % (11/27 0200) FiO2 (%):  [28 %] 28 % (11/26 1142)  Intake/Output from previous day: 11/26 0701 - 11/27 0700 In: 240 [P.O.:240] Out: 550 [Urine:550] Intake/Output this shift:    Physical exam the patient is alert and oriented, at times a bit somnolent. His lower extremity motor strength is grossly normal except for some giveaway weakness.  He has bilateral mild ankle edema.  Lab Results: No results found for this basename: WBC:2,HGB:2,HCT:2,PLT:2 in the last 72 hours BMET No results found for this basename: NA:2,K:2,CL:2,CO2:2,GLUCOSE:2,BUN:2,CREATININE:2,CALCIUM:2 in the last 72 hours  Studies/Results: Dg Chest 2 View  06/29/2011  *RADIOLOGY REPORT*  Clinical Data: Wheezing.  Oxygen saturation fluctuation.  Shortness of breath.  CHEST - 2 VIEW  Comparison: 08/01/2011  Findings: Patient has had prior cervical fusion.  The film is made with shallow lung inflation, accentuating heart size.  The lungs are free of focal consolidations and pleural effusions.  There are no focal consolidations or pleural effusions. Visualized osseous structures have a normal appearance.  IMPRESSION: No evidence for acute cardiopulmonary abnormality.  Original Report Authenticated By: Patterson Hammersmith, M.D.    Assessment/Plan: Postop day #8: Patient is still having intermittent now bilateral leg pain. He seems to be slowly improving. I encouraged him to transition over to by mouth pain medications and to increase his ambulation.  Bilateral ankle  edema: Although my suspicion is low, I am going to lower extremity Dopplers to rule out DVT.  LOS: 8 days     Okey Zelek D 06/30/2011, 7:17 AM

## 2011-06-30 NOTE — Progress Notes (Signed)
Physical Therapy Treatment Patient Details Name: Jeff [name] MRN: [id number] DOB: May 23, 1961 Today's Date: 06/30/2011  PT Assessment/Plan  PT - Assessment/Plan Comments on Treatment Session: Pt remains very limited by pain.  Expect dizziness and lethargy probably due to meds. PT Plan: Discharge plan remains appropriate PT Frequency: Min 3X/week Follow Up Recommendations: Home health PT Equipment Recommended: None recommended by PT PT Goals  Acute Rehab PT Goals PT Transfer Goal: Sit to Stand/Stand to Sit - Progress: Other (comment) (limited by pain ) PT Goal: Ambulate - Progress: Other (comment) (limited due to pain and dizziness)  PT Treatment Precautions/Restrictions  Precautions Precautions: Back Precaution Booklet Issued: Yes (comment) (Back handout given.) Precaution Comments: Followed back precautions with sit to stand and amb.  but no bed mobility performed Required Braces or Orthoses: No Restrictions Weight Bearing Restrictions: No Mobility (including Balance) Transfers Sit to Stand: 5: Supervision;From bed;With upper extremity assist Sit to Stand Details (indicate cue type and reason): pt needed incr time Stand to Sit: 5: Supervision;With upper extremity assist;To chair/3-in-1;With armrests Stand to Sit Details: Cues for hand placement Ambulation/Gait Ambulation/Gait Assistance: Other (comment) (min guard assist secondary to groggy ) Ambulation/Gait Assistance Details (indicate cue type and reason): Followed with recliner due to dizziness yesterday.  Pt again became dizzy.  Had been upright for 10 minutes prior to dizziness. Ambulation Distance (Feet): 60 Feet Assistive device: Rolling walker Gait Pattern: Trunk flexed;Decreased step length - right;Decreased step length - left (verbal cues to stand more erect and look up)    Exercise    End of Session PT - End of Session Equipment Utilized During Treatment: Gait belt Activity Tolerance: Patient limited by  pain Patient left: in chair;with call bell in reach;with family/visitor present Nurse Communication: Mobility status for ambulation General Behavior During Session: Lethargic (initially alert but more lethargic at end of session) Cognition: Impaired (slow to process)  Rashad Auld 06/30/2011, 3:33 PM [facility] PT [phone]

## 2011-06-30 NOTE — Progress Notes (Signed)
*  PRELIMINARY RESULTS*  Bilateral Lower Extremity Venous Duplex has been performed. No obvious DVT seen in either leg.  Farrel Demark 06/30/2011, 10:36 AM

## 2011-07-01 MED ORDER — IBUPROFEN 600 MG PO TABS
600.0000 mg | ORAL_TABLET | Freq: Three times a day (TID) | ORAL | Status: AC
  Administered 2011-07-02 – 2011-07-03 (×5): 600 mg via ORAL
  Filled 2011-07-01 (×5): qty 1

## 2011-07-01 NOTE — Progress Notes (Signed)
Patient ID: Jeff [name], male   DOB: 1960-12-16, 50 y.o.   MRN: [id number] Subjective:  The patient is mildly somnolent but easily arousable. He continues to complain of leg pain. They should also has some mild edema in his bilateral hands and feet. He complains of tremors. He feels the tremors are second to the Lyrica  Objective: Vital signs in last 24 hours: Temp:  [97.4 F (36.3 C)-99.8 F (37.7 C)] 99.8 F (37.7 C) (11/28 1510) Pulse Rate:  [75-93] 93  (11/28 1510) Resp:  [16-20] 20  (11/28 1510) BP: (107-129)/(65-81) 119/75 mmHg (11/28 1510) SpO2:  [95 %-99 %] 96 % (11/28 1510)  Intake/Output from previous day: 11/27 0701 - 11/28 0700 In: -  Out: 2450 [Urine:2450] Intake/Output this shift:    Physical exam the patient is alert and oriented. He is moving all 4 extremities well. He does have some mild edema in his hands and feet.  The patient has symmetric rhythmic tremors in his bilateral hands. When his family held his hands still the tremors began in his feet. Again rhythmic and symmetric.  Lab Results: No results found for this basename: WBC:2,HGB:2,HCT:2,PLT:2 in the last 72 hours BMET No results found for this basename: NA:2,K:2,CL:2,CO2:2,GLUCOSE:2,BUN:2,CREATININE:2,CALCIUM:2 in the last 72 hours  Studies/Results: No results found.  Assessment/Plan: Leg pain: His postoperative lumbar and thoracic MRIs look good. I don't think this is a complex regional pain syndrome as this would be quite uncommon. We'll continue treat him symptomatically.  Tremors: I will asked neurology to see the patient. I spoke with Dr. Thana Farr.  Swelling: I wonder for secondary to the Lyrica. I'm going to stop it. I'll get a lower extremity ultrasound throughout DVT.  LOS: 9 days     Jorey Dollard D 07/01/2011, 6:15 PM

## 2011-07-01 NOTE — Consult Note (Signed)
Reason for Consult: lower extremity pain for 9 days, now started trembling/ fever  . 07-01-11 at 23.09 hours. Referring Physician: Terrilee Files, Attending  NS  Jeff [name] is an 50 y.o. male.  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  Patient developed severe pain from the first day of surgery on, described as a left lower extremity severe burning , to simple touch  Of the leg, Response in  the left shin,  knee is  described  as allodynia, with the feeling" of a hot rod on the limb "poking" .  Patient begun complaining about this on 06-1911 , no response to Lyrica , requesting high doses of narcotics.  Placed on lyrica  , he begun to tremble and twitch, this is not very impressive , and the tremor/twitchiness is not symmetric in amplitude or frequency, may be a medication response. Lyrica was discontinued today.  Patient now complaining about a pain coming like an electric shock from his feet on the left  To the groin and abdomen and decreasing on the left side - "shooting " .  The patient is very repetitive and logorrhoeic , yet slurred in his speech and can barley keep his eyes open, he stated he had a fever today, but his incision looks clean and dry, not swollen, hot or red.   Past Medical History  Diagnosis Date  . Hypertension   . Tuberculosis     POSITIVE  TB SKIN TEST 1992.6 MTH TX     Past Surgical History  Procedure Date  . Cervical fusion   . Carpal tunnel release   . Knee arthroscopy   . Lumbar laminectomy/decompression microdiscectomy 06/22/2011    Procedure: LUMBAR LAMINECTOMY/DECOMPRESSION MICRODISCECTOMY;  Surgeon: Cristi Loron;  Location: [facility];  Service: Neurosurgery;  Laterality: N/A;  Thoracic Ten-Eleven,Thoracic Eleven-Twelve Laminectomy    History reviewed per spouse- for further information review H and P of  primary service - medical,  surgical and family/social Hx.   No pertinent family history.  Social History:  does not have a smoking history on file. He does not have any smokeless tobacco history on file. He reports that he drinks alcohol. His drug history not on file.  Allergies: No Known Allergies  Medications: medication reviewed - per primary service No results found for this or any previous visit (from the past 48 hour(s)).  No results found.  Review of Systems  Constitutional: Positive for chills.  HENT: Positive for neck pain.   Eyes: Negative.   Respiratory: Negative.   Cardiovascular: Negative.   Gastrointestinal: Positive for constipation.  Genitourinary: Negative.   Musculoskeletal: Positive for myalgias, back pain and joint pain.  Skin:       Puffiness , swollen ankles and fingers.   Neurological: Positive for tremors, sensory change, speech change and weakness.  Endo/Heme/Allergies: Negative.   Psychiatric/Behavioral: The patient has insomnia.        Patient appears to have memory loss, seems intoxicated by pain medication- slurred speech, perseveration of information, inappropriate affect, "in pain" while almost asleep and very drowsy.    Blood pressure 119/69, pulse 92, temperature 100.1 F (37.8 C), temperature source Oral, resp. rate 20, SpO2 97.00%. Physical Exam  Drowsy, droopy eyed and slurred speech- on narcotic pain medications.   perseveration. EOM intact , facial symmetry, tongue and uvula midline.   neck supple. Can raise both arms, has  equal grip strength bilat - full strength and no tremor noted during exam Has  Not allowed me to touch the right  lower extremity, would not move it spontaneously and leg looks puffy and pale . No DTRs on the R leg - patient not permitting due to pain.  Was not able to elicit babinski. Patient wiggles toes better on the left than right   pain described : see above - allodynia , RDS?  tremor was briefly seen as the patient  braced himself on both  arms to sit straight up in bed - not a tremor , more a trembling, likely med induced or a sign of some coordination difficulties while being sedated. Patient reportedly walked with PT and has normal bowl and bladder control.     Assessment/Plan: MS changes from high dose pain medication-  Febrile  Tonight- obtain BC times 2, Give NSAID. Repeat image of the surgical level ,down to  S 1/2 for changes since 8 days ago.  Obtain a rehab and pain medicine consult , consider using diazepam for sleep induction.  NCS and EMG with physiatrist .    Melvyn Novas, M.D.  Night call- consult general neurology  07/01/2011, 10:46 PM

## 2011-07-02 ENCOUNTER — Inpatient Hospital Stay (HOSPITAL_COMMUNITY): Payer: 59

## 2011-07-02 DIAGNOSIS — M4804 Spinal stenosis, thoracic region: Secondary | ICD-10-CM

## 2011-07-02 LAB — BASIC METABOLIC PANEL
Calcium: 8.6 mg/dL (ref 8.4–10.5)
Creatinine, Ser: 1.35 mg/dL (ref 0.50–1.35)
GFR calc Af Amer: 68 mL/min — ABNORMAL LOW (ref >90)
Potassium: 4.1 mEq/L (ref 3.5–5.1)

## 2011-07-02 LAB — CBC
HCT: 38.6 % — ABNORMAL LOW (ref 39.0–52.0)
Platelets: 182 10*3/uL (ref 150–400)
RBC: 4.02 MIL/uL — ABNORMAL LOW (ref 4.22–5.81)
RDW: 12.7 % (ref 11.5–15.5)
WBC: 14 10*3/uL — ABNORMAL HIGH (ref 4.0–10.5)

## 2011-07-02 LAB — DIFFERENTIAL
Basophils Absolute: 0 10*3/uL (ref 0.0–0.1)
Lymphocytes Relative: 14 % (ref 12–46)
Lymphs Abs: 1.9 10*3/uL (ref 0.7–4.0)
Neutro Abs: 9.8 10*3/uL — ABNORMAL HIGH (ref 1.7–7.7)

## 2011-07-02 MED ORDER — GADOBENATE DIMEGLUMINE 529 MG/ML IV SOLN
20.0000 mL | Freq: Once | INTRAVENOUS | Status: AC | PRN
  Administered 2011-07-02: 20 mL via INTRAVENOUS

## 2011-07-02 NOTE — Progress Notes (Signed)
Patient ID: Jeff [name], male   DOB: 09/29/1960, 50 y.o.   MRN: [id number] Subjective:  The patient is alert and pleasant. He complains of right foot pain. He looks much better than last night.  Objective: Vital signs in last 24 hours: Temp:  [98.2 F (36.8 C)-100.1 F (37.8 C)] 98.3 F (36.8 C) (11/29 0600) Pulse Rate:  [73-93] 88  (11/29 0600) Resp:  [19-20] 20  (11/29 0600) BP: (91-119)/(62-75) 113/73 mmHg (11/29 0600) SpO2:  [91 %-97 %] 97 % (11/29 0600)  Intake/Output from previous day: 11/28 0701 - 11/29 0700 In: -  Out: 550 [Urine:550] Intake/Output this shift:    Physical exam the patient is mildly somnolent. He is alert and oriented. His deep tendon reflexes are 2-2+/4 his bilateral quadriceps. He continues to have giveaway weakness in his lower extremities.  Lab Results: No results found for this basename: WBC:2,HGB:2,HCT:2,PLT:2 in the last 72 hours BMET No results found for this basename: NA:2,K:2,CL:2,CO2:2,GLUCOSE:2,BUN:2,CREATININE:2,CALCIUM:2 in the last 72 hours  Studies/Results: No results found.  Assessment/Plan: Leg pain: I appreciate Dr. Juluis Mire help. I'll plan to repeat his thoracic and lumbar MRI. He appears better clinically off the Lyrica.  Low-grade fever: The patient had a chest x-ray a couple days ago which looked good. His incision is healing well. I'll check a CBC and BMP.  LOS: 10 days     Jeff [name] D 07/02/2011, 1:12 PM

## 2011-07-02 NOTE — Progress Notes (Signed)
Physical Therapy Treatment Patient Details Name: Jeff [name] MRN: [id number] DOB: 1961-05-19 Today's Date: 07/02/2011  PT Assessment/Plan  PT - Assessment/Plan Comments on Treatment Session: Pt still limited by pain and lethargy.  wife pressent.  Pt walked 40 feet and then right UE shaking greather than left.  Pt refused to sit up in chair as it makes him hurt more - returned to bed. PT Plan: Discharge plan remains appropriate PT Frequency: Min 3X/week Follow Up Recommendations: Home health PT PT Goals  Acute Rehab PT Goals PT Transfer Goal: Sit to Stand/Stand to Sit - Progress: Other (comment) (limited by pain.) PT Goal: Ambulate - Progress: Other (comment) (llimited to pain and lethargy)  PT Treatment Precautions/Restrictions  Precautions Precautions: Back Precaution Booklet Issued: Yes (comment) (Back handout given.) Precaution Comments: Pt was sitting EOB and started to twist to look behind him and reminded him self no twisting Required Braces or Orthoses: No Restrictions Weight Bearing Restrictions: No Mobility (including Balance) Bed Mobility Bed Mobility: No Transfers Sit to Stand: 5: Supervision;From bed;With upper extremity assist Stand to Sit: 4: Min assist;With upper extremity assist;With armrests Stand to Sit Details: pt needed assist to lower himself slowly Ambulation/Gait Ambulation/Gait Assistance: 4: Min assist Ambulation/Gait Assistance Details (indicate cue type and reason): Pt needed min assist due to groggy.  Pt cued to stand erect.  the more tired he got the more flexed he became Ambulation Distance (Feet): 40 Feet Assistive device: Rolling walker Gait Pattern: Trunk flexed;Decreased step length - right;Decreased step length - left  Posture/Postural Control Posture/Postural Control: Postural limitations Postural Limitations: cued pt to squeeze gluts and stand more upright and to look up    End of Session PT - End of Session Equipment Utilized  During Treatment: Gait belt Activity Tolerance: Patient limited by pain Patient left: in bed;with call bell in reach;with family/visitor present (pt refused chair as he said would make him hurt too much) Nurse Communication: Mobility status for ambulation General Behavior During Session: Lethargic (falling asleep, eyes closing.  Hasnt been medicated since am) Cognition: Impaired (slow to process and stay on task)  07/02/2011   Ranae Palms, PT   Judson Roch 07/02/2011, 2:25 PM

## 2011-07-02 NOTE — Consult Note (Signed)
Physical Medicine and Rehabilitation Consult Reason for Consult:lumbar radiculopathy Referring Phsyician: Dr.Jenkins Jeff [name] is an 50 y.o. male.   HPI: 50 year old white male mid November 19 with increased back pain radiating to the lower extremities. MRI of the spine demonstrated moderate multifactorial spinal stenosis with radiculopathy at thoracic T10 and 11 as well as thoracic 11 and 12. No change with conservative care. Underwent thoracic T10 and T11 laminectomy to decompress thoracic 910 and XI nerve roots on November 19 per Dr. Tressie Stalker. Postoperative pain management. Followup MRI of spine on November 21 showed no findings to explain back pain with excellent decompression of the spinal cord and thecal sac. Bouts of lethargy felt to be narcotic induced oh which improved when Lyrica was discontinued. Venous Doppler studies of lower tremor is November 27 were negative for deep vein thrombosis. Currently using OxyContin 60 mg every 12 hours and close monitoring of mental status. Requires minimal assistance to ambulate 60 feet with a rolling walker no back brace  needed with routine back precautions. Bouts of dizziness which did limit his overall mobility. Physical medicine and rehabilitation was consulted at the right recommendations of physical therapy to consider inpatient rehabilitation services.  Review of Systems  Constitutional: Positive for malaise/fatigue.  Eyes: Negative for double vision.  Respiratory: Negative for cough.   Cardiovascular: Negative for chest pain.  Gastrointestinal: Positive for constipation. Negative for nausea.  Genitourinary: Negative for urgency.  Musculoskeletal: Positive for back pain.  Skin: Negative.   Neurological: Positive for dizziness. Negative for seizures and headaches.  Psychiatric/Behavioral: Negative.    Past Medical History  Diagnosis Date  . Hypertension   . Tuberculosis     POSITIVE  TB SKIN TEST 1992.6 MTH TX    Past Surgical  History  Procedure Date  . Cervical fusion   . Carpal tunnel release   . Knee arthroscopy   . Lumbar laminectomy/decompression microdiscectomy 06/22/2011    Procedure: LUMBAR LAMINECTOMY/DECOMPRESSION MICRODISCECTOMY;  Surgeon: Cristi Loron;  Location: [facility];  Service: Neurosurgery;  Laterality: N/A;  Thoracic Ten-Eleven,Thoracic Eleven-Twelve Laminectomy   History reviewed. No pertinent family history. Social History:  does not have a smoking history on file. He does not have any smokeless tobacco history on file. He reports that he drinks alcohol. His drug history not on file. Allergies: No Known Allergies Medications Prior to Admission  Medication Dose Route Frequency Provider Last Rate Last Dose  . 0.9 %  sodium chloride infusion  250 mL Intravenous Continuous Cristi Loron 1 mL/hr at 06/26/11 0918 1,000 mL at 06/26/11 0918  . acetaminophen (TYLENOL) tablet 650 mg  650 mg Oral Q4H PRN Cristi Loron   650 mg at 07/01/11 1816   Or  . acetaminophen (TYLENOL) suppository 650 mg  650 mg Rectal Q4H PRN Cristi Loron      . albuterol (PROVENTIL) (5 MG/ML) 0.5% nebulizer solution 2.5 mg  2.5 mg Nebulization Once Cristi Loron   2.5 mg at 06/29/11 1138  . bupivacaine liposome (EXPAREL) 1.3 % injection 266 mg  20 mL Infiltration To NeurOR Cristi Loron   20 mL at 06/22/11 1123  . ceFAZolin (ANCEF) IVPB 2 g/50 mL premix  2 g Intravenous Q8H Duane Lope Jenkins   2 g at 06/23/11 0152  . dexamethasone (DECADRON) injection 4 mg  4 mg Intravenous Q6H Duane Lope Jenkins   4 mg at 06/23/11 2355  . dexamethasone (DECADRON) injection 8 mg  8 mg Intravenous Once Cristi Loron  8 mg at 06/22/11 1437  . diazepam (VALIUM) tablet 10 mg  10 mg Oral Q6H PRN Cristi Loron   10 mg at 07/02/11 8657  . diphenhydrAMINE (BENADRYL) injection 12.5 mg  12.5 mg Intravenous Q6H PRN Cristi Loron       Or  . diphenhydrAMINE (BENADRYL) 12.5 MG/5ML elixir 12.5 mg  12.5 mg Oral Q6H PRN  Cristi Loron      . docusate sodium (COLACE) capsule 100 mg  100 mg Oral BID Cristi Loron   100 mg at 07/02/11 8469  . gabapentin (NEURONTIN) capsule 800 mg  800 mg Oral TID Duane Lope Jenkins   800 mg at 07/02/11 1059  . hydrochlorothiazide (HYDRODIURIL) tablet 25 mg  25 mg Oral Daily Madolyn Frieze, PHARMD   25 mg at 07/02/11 1100  . HYDROcodone-acetaminophen (NORCO) 5-325 MG per tablet 1-2 tablet  1-2 tablet Oral Q4H PRN Cristi Loron   2 tablet at 07/01/11 2111  . HYDROmorphone (DILAUDID) injection 0.25-0.5 mg  0.25-0.5 mg Intravenous Q5 min PRN Rivka Barbara, MD   0.5 mg at 06/22/11 1257  . HYDROmorphone (DILAUDID) injection 1 mg  1 mg Intravenous Q2H PRN Donzetta Sprung Cram   1 mg at 07/02/11 6295  . ibuprofen (ADVIL,MOTRIN) tablet 600 mg  600 mg Oral TID Melvyn Novas, MD   600 mg at 07/02/11 1101  . lactated ringers infusion   Intravenous Continuous Cristi Loron 20 mL/hr at 06/28/11 2200    . menthol-cetylpyridinium (CEPACOL) lozenge 3 mg  1 lozenge Oral PRN Cristi Loron       Or  . phenol (CHLORASEPTIC) mouth spray 1 spray  1 spray Mouth/Throat PRN Cristi Loron      . metoprolol (LOPRESSOR) tablet 100 mg  100 mg Oral BID Cristi Loron   100 mg at 07/02/11 1100  . midazolam (VERSED) injection 0.5 mg  0.5 mg Intravenous Q5 Min x 2 PRN Rivka Barbara, MD   0.5 mg at 06/22/11 1320  . morphine 4 MG/ML injection 4 mg  4 mg Intravenous Once Cristi Loron   4 mg at 06/23/11 1353  . morphine 4 MG/ML injection           . naloxone [facility]) injection 0.4 mg  0.4 mg Intravenous PRN Cristi Loron       And  . sodium chloride 0.9 % injection 9 mL  9 mL Intravenous PRN Cristi Loron      . olmesartan (BENICAR) tablet 20 mg  20 mg Oral Daily Madolyn Frieze, PHARMD   20 mg at 07/02/11 1100  . ondansetron (ZOFRAN) injection 4 mg  4 mg Intravenous Q6H PRN Cristi Loron      . oxyCODONE (OXYCONTIN) 12 hr tablet 20 mg  20 mg Oral NOW  Cristi Loron   20 mg at 06/27/11 1126  . oxyCODONE (OXYCONTIN) 12 hr tablet 60 mg  60 mg Oral Q12H Cristi Loron   60 mg at 07/02/11 2841  . oxyCODONE-acetaminophen (PERCOCET) 5-325 MG per tablet 1-2 tablet  1-2 tablet Oral Q4H PRN Cristi Loron   2 tablet at 07/02/11 3244  . potassium chloride SA (K-DUR,KLOR-CON) CR tablet 20 mEq  20 mEq Oral Daily Cristi Loron   20 mEq at 07/02/11 1100  . sodium chloride 0.9 % bolus 1,000 mL  1,000 mL Intravenous Once Donzetta Sprung Cram   1,000 mL at 06/26/11 0810  . sodium chloride  0.9 % injection 3 mL  3 mL Intravenous Q12H Cristi Loron   3 mL at 07/01/11 2200  . sodium chloride 0.9 % injection 3 mL  3 mL Intravenous PRN Cristi Loron      . testosterone (ANDROGEL) gel 5 g  5 g Transdermal Daily Madolyn Frieze, PHARMD   5 g at 07/02/11 1110  . DISCONTD: 50,000 units bacitracin in 0.9% normal saline 250 mL irrigation    PRN Cristi Loron      . DISCONTD: bacitracin ointment    PRN Cristi Loron   1 application at 06/22/11 1130  . DISCONTD: Bupivacaine-Epinephrine PF (MARCAINE W/ EPI (PF)) 0.5-1:200000 % injection    PRN Cristi Loron   10 mL at 06/22/11 0956  . DISCONTD: ceFAZolin (ANCEF) IVPB 2 g/50 mL premix  2 g Intravenous Once Cristi Loron      . DISCONTD: dexamethasone (DECADRON) injection 8 mg  8 mg Intravenous Once Cristi Loron      . DISCONTD: diazepam (VALIUM) injection 5 mg  5 mg Intravenous NOW Cristi Loron      . DISCONTD: diazepam (VALIUM) tablet 5 mg  5 mg Oral Q6H PRN Cristi Loron   5 mg at 06/24/11 1720  . DISCONTD: diphenhydrAMINE (BENADRYL) 12.5 MG/5ML elixir 12.5 mg  12.5 mg Oral Q6H PRN Cristi Loron      . DISCONTD: diphenhydrAMINE (BENADRYL) injection 12.5 mg  12.5 mg Intravenous Q6H PRN Cristi Loron      . DISCONTD: gabapentin (NEURONTIN) capsule 300 mg  300 mg Oral TID Duane Lope Jenkins   300 mg at 06/24/11 1621  . DISCONTD: gabapentin (NEURONTIN) tablet 600 mg  600  mg Oral TID Cristi Loron      . DISCONTD: hemostatic agents    PRN Cristi Loron   1 application at 06/22/11 4098  . DISCONTD: HYDROmorphone (DILAUDID) injection 0.5 mg  0.5 mg Intravenous Q10 min PRN Rivka Barbara, MD      . DISCONTD: HYDROmorphone (DILAUDID) PCA injection 0.3 mg/mL   Intravenous Q4H Duane Lope Jenkins   7.5 mg at 06/23/11 0431  . DISCONTD: HYDROmorphone (DILAUDID) PCA injection 0.3 mg/mL   Intravenous Q4H Duane Lope Jenkins   2.4 mg at 06/26/11 0351  . DISCONTD: ketorolac (TORADOL) 30 MG/ML injection 30 mg  30 mg Intravenous Once Rivka Barbara, MD      . DISCONTD: morphine 2 MG/ML injection 1-4 mg  1-4 mg Intravenous Q4H PRN Hilario Quarry Amend, PHARMD   4 mg at 07/01/11 0257  . DISCONTD: morphine bolus via infusion 1-4 mg  1-4 mg Intravenous Q4H PRN Cristi Loron      . DISCONTD: naloxone [facility]) injection 0.4 mg  0.4 mg Intravenous PRN Cristi Loron      . DISCONTD: ondansetron (ZOFRAN) injection 4 mg  4 mg Intravenous Once PRN Rivka Barbara, MD      . DISCONTD: ondansetron [facility]) injection 4 mg  4 mg Intravenous Q4H PRN Cristi Loron      . DISCONTD: ondansetron (ZOFRAN) injection 4 mg  4 mg Intravenous Q6H PRN Cristi Loron      . DISCONTD: oxyCODONE (OXYCONTIN) 12 hr tablet 20 mg  20 mg Oral Q12H Duane Lope Jenkins   20 mg at 06/27/11 1010  . DISCONTD: oxyCODONE (OXYCONTIN) 12 hr tablet 20 mg  20 mg Oral NOW Cristi Loron      . DISCONTD:  oxyCODONE (OXYCONTIN) 12 hr tablet 40 mg  40 mg Oral Q12H Duane Lope Jenkins   40 mg at 06/28/11 0930  . DISCONTD: pregabalin (LYRICA) capsule 50 mg  50 mg Oral TID Cristi Loron      . DISCONTD: pregabalin (LYRICA) capsule 50 mg  50 mg Oral TID Sabra Heck, PHARMD   50 mg at 06/28/11 7829  . DISCONTD: pregabalin (LYRICA) capsule 75 mg  75 mg Oral TID Cristi Loron   75 mg at 07/01/11 1208  . DISCONTD: sodium chloride 0.9 % injection 9 mL  9 mL Intravenous PRN Cristi Loron       . DISCONTD: sodium chloride irrigation 0.9 %    PRN Cristi Loron   1,000 mL at 06/22/11 0956  . DISCONTD: Testosterone SOLN 30 mg  30 mg Transdermal Daily Cristi Loron      . DISCONTD: thrombin kit 5000 units    PRN Cristi Loron   5,000 Units at 06/22/11 5621  . DISCONTD: valsartan-hydrochlorothiazide (DIOVAN-HCT) 160-25 MG per tablet 1 tablet  1 tablet Oral Daily Cristi Loron      . DISCONTD: zolpidem (AMBIEN) tablet 10 mg  10 mg Oral QHS PRN Cristi Loron   10 mg at 06/28/11 2136   No current outpatient prescriptions on file as of 07/02/2011.    Home: Home Living Lives With: Spouse Receives Help From: Family;Other (Comment) (Wife PRN.) Type of Home: House Home Layout: One level Home Access: Stairs to enter Entrance Stairs-Rails: Right;Left;Can reach both Entrance Stairs-Number of Steps: 4 Home Adaptive Equipment: Straight cane (Will borrow above straight cane from Dad.)  Functional History: Prior Function Level of Independence: Independent with basic ADLs;Independent with homemaking with ambulation;Independent with gait;Independent with transfers Able to Take Stairs?: Reciprically Driving: Yes Vocation: Full time employment Vocation Requirements: [employer] work at [employer], driving in trunk, supervising. Functional Status:  Mobility: Bed Mobility Bed Mobility: Yes Rolling Right: 6: Modified independent (Device/Increase time);With rail Rolling Left: 5: Supervision Rolling Left Details (indicate cue type and reason): cues to reinforce back precautions & technique- pt attempting to go straight from supine to sit Right Sidelying to Sit: HOB flat;With rails;6: Modified independent (Device/Increase time) Right Sidelying to Sit Details (indicate cue type and reason): increased time due to pain Left Sidelying to Sit: 5: Supervision Left Sidelying to Sit Details (indicate cue type and reason): cues for sequencing, technique, & reinforcement of  precautions Sit to Supine - Right: 4: Min assist Sit to Supine - Right Details (indicate cue type and reason): assistance to lift LE's onto bed & prevent twisting.  Cues for sequencing/technique Transfers Transfers: Yes Sit to Stand: 5: Supervision;From bed;With upper extremity assist Sit to Stand Details (indicate cue type and reason): pt needed incr time Stand to Sit: 5: Supervision;With upper extremity assist;To chair/3-in-1;With armrests Stand to Sit Details: Cues for hand placement Ambulation/Gait Ambulation/Gait: Yes Ambulation/Gait Assistance: Other (comment) (min guard assist secondary to groggy ) Ambulation/Gait Assistance Details (indicate cue type and reason): Followed with recliner due to dizziness yesterday.  Pt again became dizzy.  Had been upright for 10 minutes prior to dizziness. Ambulation Distance (Feet): 60 Feet Assistive device: Rolling walker Gait Pattern: Trunk flexed;Decreased step length - right;Decreased step length - left (verbal cues to stand more erect and look up) Stairs: No Stairs Assistance: 6: Modified independent (Device/Increase time) Stair Management Technique: Two rails Number of Stairs: 4  Wheelchair Mobility Wheelchair Mobility: No  ADL:    Cognition:  Cognition Arousal/Alertness: Awake/alert Orientation Level: Oriented X4 Cognition Arousal/Alertness: Awake/alert Overall Cognitive Status: Appears within functional limits for tasks assessed Orientation Level: Oriented X4  Blood pressure 90/57, pulse 89, temperature 98.2 F (36.8 C), temperature source Oral, resp. rate 18, SpO2 90.00%. Physical Exam  Constitutional: He is oriented to person, place, and time. He appears well-developed.  HENT:  Head: Normocephalic.  Neck: Normal range of motion. Neck supple. No thyromegaly present.  Cardiovascular: Normal rate and regular rhythm.   Pulmonary/Chest: Effort normal and breath sounds normal. No respiratory distress.  Abdominal: Soft. He  exhibits no distension. There is no tenderness.  Musculoskeletal: He exhibits no edema.  Neurological: He is alert and oriented to person, place, and time.       Patient is groggy but answers basic commands.  Skin:       Back incision dressed.  Psychiatric:       Mood is flat but appropriate.   the patient exhibits hyper sensitivity to touch in the right dorsal foot  There is a right foot drop. Normal strength in the proximal right lower extremity and the entire left lower extremity. There is normal strength in bilateral upper extremities. The patient has full range of motion in the upper extremities and decreased ankle range of motion in the right lower extremity, left lower extremity has normal range of motion  Assessment/Plan: Diagnosis: Thoracic spinal stenosis status post decompression 1. Does the need for close, 24 hr/day medical supervision in concert with the patient's rehab needs make it unreasonable for this patient to be served in a less intensive setting? No 2. Co-Morbidities requiring supervision/potential complications: Drowsy  secondary to narcotic analgesics 3. Due to bladder management, skin/wound care and pain management, does the patient require 24 hr/day rehab nursing? No 4. Does the patient require coordinated care of a physician, rehab nurse, Not applicable secondary to high functional level to address physical and functional deficits in the context of the above medical diagnosis(es)? No Addressing deficits in the following areas: Not app 5. Can the patient actively participate in an intensive therapy program of at least 3 hrs of therapy per day at least 5 days per week? Potentially 6. The potential for patient to make measurable gains while on inpatient rehab is Not applicable 7. Anticipated functional outcomes upon discharge from inpatients are not applicable PT, not applicable OT, not applicable SLP 8. Estimated rehab length of stay to reach the above functional goals  is: Not applicable 9. Does the patient have adequate social supports to accommodate these discharge functional goals? Potentially 10. Anticipated D/C setting: Skilled nursing facility 11. Anticipated post D/C treatments: HH therapy 12. Overall Rehab/Functional Prognosis: good and fair  RECOMMENDATIONS: This patient's condition is appropriate for continued rehabilitative care in the following setting: SNF Patient has agreed to participate in recommended program. Potentially Note that insurance prior authorization may be required for reimbursement for recommended care.  Comment:   ANGIULLI,DANIEL J. 07/02/2011

## 2011-07-02 NOTE — Progress Notes (Signed)
Subjective: Patients main complaint is pain at this time of Left dorsum of foot.    Objective: Vital signs in last 24 hours: Temp:  [98.2 F (36.8 C)-100.1 F (37.8 C)] 98.3 F (36.8 C) (11/29 0600) Pulse Rate:  [73-93] 88  (11/29 0600) Resp:  [19-20] 20  (11/29 0600) BP: (91-119)/(62-75) 113/73 mmHg (11/29 0600) SpO2:  [91 %-99 %] 97 % (11/29 0600)  Intake/Output from previous day: 11/28 0701 - 11/29 0700 In: -  Out: 550 [Urine:550] Intake/Output this shift:   Nutritional status: General  Past Medical History  Diagnosis Date  . Hypertension   . Tuberculosis     POSITIVE  TB SKIN TEST 1992.6 MTH TX     Neurologic Exam: Mental Status: Alert, oriented, thought content appropriate.  Speech fluent without evidence of aphasia. Able to follow 3 step commands without difficulty. Cranial Nerves: II-Visual fields grossly intact. III/IV/VI-Extraocular movements intact.  Pupils reactive bilaterally. V/VII-Smile symmetric VIII-grossly intact IX/X-normal gag XI-bilateral shoulder shrug XII-midline tongue extension Motor: 5/5 bilaterally with normal tone and bulk (he would not let me put pressure on his right foot but able to DF and PF antigravity).    TREMOR: Upon walking into room I did not visualize any tremor.  As I spoke with the patient he started to shake his right  Arm.  I put my hand on his arm and asked if this would help.  He quickly stopped his tremor.  After I took my hand off his arm his tremor started again.  I then asked him to do a multiplication fo numbers.  While he was thinking of the answer his tremor abruptly stopped and did not start up again until he answered the question.  At his oint he started to shake both of his hands.  This again would abruptly stop if I touched either hand or had patient distracted with something other than his tremor. When he does tremor it has different characteristics through time.  At times it is rhythmic with small amplitude. Other  times it has a his wrist will rotate.     Sensory: Pinprick and light touch intact throughout, bilaterally  NOTE:  Patient was stating his right anterior leg and dorsum of foot was severely painful.  Even the slightest touch of my clove would cause him to moan. OF NOTE-while I was having him do a multiplication problem i was gently stroking his leg with slight pressure and he had no complaints of pain or discomfort.   Deep Tendon Reflexes: 2+ and symmetric throughout Plantars: Downgoing bilaterally Cerebellar: Normal finger-to-nose, normal rapid alternating movements and normal heel-to-shin test.  Normal gait and station.    Lab Results: No results found for this basename: WBC:2,HGB:2,HCT:2,PLT:2,NA:2,K:2,CL:2,CO2:2,GLUCOSE:2,BUN:2,CREATININE:2,CALCIUM:2,LABA1C in the last 72 hours Lipid Panel No results found for this basename: CHOL,TRIG,HDL,CHOLHDL,VLDL,LDLCALC in the last 72 hours  Studies/Results: No results found.  Medications:  Prior to Admission:  Prescriptions prior to admission  Medication Sig Dispense Refill  . aspirin EC 81 MG tablet Take 81 mg by mouth daily.        Marland Kitchen gabapentin (NEURONTIN) 300 MG capsule Take 300 mg by mouth 3 (three) times daily.        . metoprolol (LOPRESSOR) 100 MG tablet Take 100 mg by mouth 2 (two) times daily.        Marland Kitchen oxyCODONE (OXYCONTIN) 20 MG 12 hr tablet Take 20 mg by mouth every 12 (twelve) hours as needed. For pain.       . potassium chloride  SA (K-DUR,KLOR-CON) 20 MEQ tablet Take 20 mEq by mouth daily.        . Testosterone (AXIRON) 30 MG/ACT SOLN Place 30 mg onto the skin daily. One squirt under each arm.       . valsartan-hydrochlorothiazide (DIOVAN-HCT) 160-25 MG per tablet Take 1 tablet by mouth daily.         Scheduled:   . docusate sodium  100 mg Oral BID  . gabapentin  800 mg Oral TID  . hydrochlorothiazide  25 mg Oral Daily  . ibuprofen  600 mg Oral TID  . metoprolol  100 mg Oral BID  . olmesartan  20 mg Oral Daily  .  oxyCODONE  60 mg Oral Q12H  . potassium chloride SA  20 mEq Oral Daily  . sodium chloride  3 mL Intravenous Q12H  . testosterone  5 g Transdermal Daily  . DISCONTD: pregabalin  75 mg Oral TID   MRI Lumbar spine: IMPRESSION:  1. Minimal degenerative disc disease at L5-S1 with a small central  annular tear and tiny disc bulge without impingement.  2. Excellent decompression of the thecal sac and spinal cord at  T10-11 and T11-12 with no significant residual impingement.  3. No findings to explain this patient's severe low back pain and  right leg pain.  MRI T-spine IMPRESSION:  1. Postsurgical changes at T10-11 and T11-12 with excellent  decompression of the spinal cord and thecal sac. No change in the  small soft disc protrusion at T11-12 but there is no longer  compression of the spinal cord.  2. Otherwise, normal exam.    Assessment/Plan:  50 YO male with drowsiness most likely secondary to pain medication and Allodynia of right anterior leg and foot.    (1) agree with rehab and pain medicine consult (2) NCV/EMG out patient  No further recommendations.    Felicie Morn PA-C [facility] [phone]  07/02/2011, 11:26 AM

## 2011-07-02 NOTE — Progress Notes (Signed)
Utilization review completed. Vanya Carberry, RN, BSN. 07/02/11  

## 2011-07-03 MED ORDER — GABAPENTIN 400 MG PO CAPS
1200.0000 mg | ORAL_CAPSULE | Freq: Three times a day (TID) | ORAL | Status: DC
  Administered 2011-07-03 – 2011-07-11 (×25): 1200 mg via ORAL
  Filled 2011-07-03 (×28): qty 3

## 2011-07-03 NOTE — Progress Notes (Signed)
Patient ID: Jeff [name], male   DOB: [birth year], 50 y.o.   MRN: [id number] Subjective:  The patient is alert and pleasant. He complains of right foot pain. He tells me he does not want to go to the rehabilitation unit or or a skilled nursing facility. He was to be discharged to home.  Objective: Vital signs in last 24 hours: Temp:  [97.4 F (36.3 C)-98.2 F (36.8 C)] 98.1 F (36.7 C) (11/30 0500) Pulse Rate:  [73-89] 73  (11/30 0500) Resp:  [17-18] 18  (11/30 0500) BP: (90-115)/(52-78) 94/52 mmHg (11/30 0500) SpO2:  [90 %-96 %] 96 % (11/30 0500)  Intake/Output from previous day: 11/29 0701 - 11/30 0700 In: -  Out: 425 [Urine:425] Intake/Output this shift:    Physical exam patient's strength is grossly normal in his bilateral lower extremities except for giveaway weakness. His deep tendon reflexes are symmetric.   Imaging studies: I reviewed the patient's thoracic and lumbar MRI performed last night. It demonstrates some mild enhancement in the region of the surgery as expected expected 10 days after surgery. I don't see any evidence of significant hematoma, neural compression, or any evidence of infection.  Lab Results:  [facility] 07/02/11 1423  WBC 14.0*  HGB 13.2  HCT 38.6*  PLT 182   BMET  Basename 07/02/11 1423  NA 135  K 4.1  CL 93*  CO2 35*  GLUCOSE 112*  BUN 19  CREATININE 1.35  CALCIUM 8.6    Studies/Results: Mr Thoracic Spine W Wo Contrast  07/03/2011  *RADIOLOGY REPORT*  Clinical Data: Progressive back pain and right leg pain.  MRI THORACIC SPINE WITHOUT AND WITH CONTRAST  Technique:  Multiplanar and multiecho pulse sequences of the thoracic spine were obtained without and with intravenous contrast.  Contrast: 20mL MULTIHANCE GADOBENATE DIMEGLUMINE 529 MG/ML IV SOLN  Comparison: MRI dated 11/20 and 03/23/2011  Findings: The scan extends from C7-T1 through T12-L1.  Since the prior exam the patient has had reoperation at T10-11 and T11-12. Fluid is seen in  the subcutaneous tissues immediately posterior to the thecal sac at the site of the posterior laminectomies and resection of the spinous processes.  The thecal sac and spinal canal are well decompressed.  Again noted is a small disc protrusion just to the left of midline at T11-12, unchanged.  There is no myelopathy or mass or significant compression of the spinal cord.  Neural foramina are widely patent.   Tip of the conus is well visualized and is normal.  The remainder of the thoracic spine is unchanged and normal.  IMPRESSION: Expected fluid collections at the operative site at T10-11 and T11- 12.  Thecal sac is well decompressed.  No change in the small disc protrusion at T11-12.  No significant impingement.  Original Report Authenticated By: Gwynn Burly, M.D.    Assessment/Plan: Postop day #11: The patient looks a bit better today. I will increase his Neurontin (the patient was not able to tolerate the Lyrica). I discussed with his wife whether or not the family is able to care for med home. If they are in agreement to take him home I will discharge him later today. Advanced all the patient's questions. We will plan for an outpatient bilateral lower extremity NCV/EMG to rule out neuropathy, plexopathy, etc.  LOS: 11 days     Khylon Davies D 07/03/2011, 7:50 AM

## 2011-07-03 NOTE — Progress Notes (Signed)
Patient ID: Jeff [name], male   DOB: 31-Oct-1960, 50 y.o.   MRN: [id number] Subjective:  As per previous note  Objective: Vital signs in last 24 hours: Temp:  [97.4 F (36.3 C)-98.2 F (36.8 C)] 98.1 F (36.7 C) (11/30 0500) Pulse Rate:  [73-89] 73  (11/30 0500) Resp:  [17-18] 18  (11/30 0500) BP: (90-115)/(52-78) 94/52 mmHg (11/30 0500) SpO2:  [90 %-96 %] 96 % (11/30 0500)  Intake/Output from previous day: 11/29 0701 - 11/30 0700 In: -  Out: 425 [Urine:425] Intake/Output this shift:    Physical exam as per previous note  Lab Results:  Basename 07/02/11 1423  WBC 14.0*  HGB 13.2  HCT 38.6*  PLT 182   BMET  Basename 07/02/11 1423  NA 135  K 4.1  CL 93*  CO2 35*  GLUCOSE 112*  BUN 19  CREATININE 1.35  CALCIUM 8.6    Studies/Results: Mr Thoracic Spine W Wo Contrast  07/03/2011  *RADIOLOGY REPORT*  Clinical Data: Progressive back pain and right leg pain.  MRI THORACIC SPINE WITHOUT AND WITH CONTRAST  Technique:  Multiplanar and multiecho pulse sequences of the thoracic spine were obtained without and with intravenous contrast.  Contrast: 20mL MULTIHANCE GADOBENATE DIMEGLUMINE 529 MG/ML IV SOLN  Comparison: MRI dated 11/20 and 03/23/2011  Findings: The scan extends from C7-T1 through T12-L1.  Since the prior exam the patient has had reoperation at T10-11 and T11-12. Fluid is seen in the subcutaneous tissues immediately posterior to the thecal sac at the site of the posterior laminectomies and resection of the spinous processes.  The thecal sac and spinal canal are well decompressed.  Again noted is a small disc protrusion just to the left of midline at T11-12, unchanged.  There is no myelopathy or mass or significant compression of the spinal cord.  Neural foramina are widely patent.   Tip of the conus is well visualized and is normal.  The remainder of the thoracic spine is unchanged and normal.  IMPRESSION: Expected fluid collections at the operative site at T10-11 and  T11- 12.  Thecal sac is well decompressed.  No change in the small disc protrusion at T11-12.  No significant impingement.  Original Report Authenticated By: Gwynn Burly, M.D.    Assessment/Plan: Leukocytosis: The patient's white count is mildly elevated. I don't see any signs of infection clinically. Is not running fevers. I recommend observation that the patient take his temperature at home occasionally and, with any fevers.  LOS: 11 days     Hawkin Charo D 07/03/2011, 7:55 AM

## 2011-07-03 NOTE — Progress Notes (Signed)
CSW received consult for SNF. CSW met with pt's wife, as pt slept soundly. For full assessment please see pt's chart. CSW will continue to follow.   Dede Query, MSW, [facility] [phone]

## 2011-07-03 NOTE — Progress Notes (Signed)
Evaluated for possible admission.  Please see rehab consult done by Dr. Wynn Banker 07/02/11.  Patient most appropriate for SNF.  Already doing well with mobility and does not require 24 h medical supervision.  Agree with need for SNF or could consider HH therapies.  Pager [phone]

## 2011-07-04 NOTE — Progress Notes (Signed)
CSW  Completed FL2 and initiated SNF search in [employer]. FL2 on chart for MD signature. CSW will f/u with offers.  Dellie Burns, MSW, [place] [phone]

## 2011-07-04 NOTE — Progress Notes (Signed)
Filed Vitals:   07/03/11 2339 07/04/11 0200 07/04/11 0608 07/04/11 1047  BP: 94/62 110/68 102/61 105/68  Pulse: 99 97 88 116  Temp:  98.8 F (37.1 C) 98.7 F (37.1 C) 98.8 F (37.1 C)  TempSrc:  Oral Oral Oral  Resp:  20 20 18   SpO2:  94% 97% 94%    CBC  Basename 07/02/11 1423  WBC 14.0*  HGB 13.2  HCT 38.6*  PLT 182   BMET  Basename 07/02/11 1423  NA 135  K 4.1  CL 93*  CO2 35*  GLUCOSE 112*  BUN 19  CREATININE 1.35  CALCIUM 8.6    Patient sitting up on the side of the bed being attended to by his wife. Patient has rhythmic movement of the upper extremities and as we met with the patient and spoke with him developed similar rhythmic motion in the lower extremities  Incision has healed well.  Patient reports that he has been up and ambulating around the nursing circle with a rolling walker earlier today.   Plan: Have encouraged patient to continue to work on ambulation and activity to build up his strength and stamina. Certainly his and his wife hope is that the difficulties that he is having will improve with time and I hope so as well.  Continue care as per Dr. Lovell Sheehan plan.

## 2011-07-05 NOTE — Progress Notes (Signed)
Filed Vitals:   07/05/11 0258 07/05/11 0642 07/05/11 1004 07/05/11 1015  BP: 95/63 93/60 123/73 131/81  Pulse: 73 71 80 80  Temp: 98.3 F (36.8 C) 97.7 F (36.5 C)  98.1 F (36.7 C)  TempSrc: Oral Oral  Oral  Resp: 20 18  18   SpO2: 93% 91% 100% 93%     Patient continued to complain of pain at this point in his right lower extremity he initially said that he did not rests at all last night but then describes having not had any pain medication for it 8 hours because he slept. He continues on numerous medications including Percocet Vicodin Neurontin and Valium.  He continues to have rhythmic shaking motions of his upper extremities but when distracted they cease.   Plan: Again encourage ambulation. No changes to current treatment regimen.

## 2011-07-06 NOTE — Progress Notes (Addendum)
Physical Therapy Treatment Patient Details Name: Jeff [name] MRN: [id number] DOB: Mar 31, 1961 Today's Date: 07/06/2011  PT Assessment/Plan  PT - Assessment/Plan Comments on Treatment Session: Patient is progressing well with his mobility.  He is most limited by pain.  Goals check and revised as needed.   PT Plan: Discharge plan needs to be updated. PT Frequency: Min 3X/week Follow Up Recommendations: Skilled nursing facility- family wanting to pursue this level of care.   Equipment Recommended: Defer to next venue PT Goals  Acute Rehab PT Goals Pt will go Supine/Side to Sit: with modified independence;with HOB 0 degrees;Other (comment) (no rail) PT Goal: Supine/Side to Sit - Progress: Revised (modified due to lack of progress/goal met) Pt will go Sit to Supine/Side: with modified independence;with HOB 0 degrees;Other (comment) (no rail) PT Goal: Sit to Supine/Side - Progress: Revised (modified due to lack of progress/goal met) Pt will go Sit to Stand: with modified independence PT Goal: Sit to Stand - Progress: Progressing toward goal Pt will go Stand to Sit: with modified independence PT Goal: Stand to Sit - Progress: Progressing toward goal PT Goal: Ambulate - Progress: Progressing toward goal Additional Goals PT Goal: Additional Goal #1 - Progress: Progressing toward goal  PT Treatment Precautions/Restrictions  Precautions Precautions: Back Precaution Booklet Issued: Yes (comment) (Back handout given.) Precaution Comments: Pt was sitting EOB and started to twist to look behind him and reminded him self no twisting Required Braces or Orthoses: No Restrictions Weight Bearing Restrictions: No Mobility (including Balance) Bed Mobility Rolling Left: 6: Modified independent (Device/Increase time);With rail Left Sidelying to Sit: 6: Modified independent (Device/Increase time);With rails;HOB elevated (comment degrees) (HOB 45 degrees) Left Sidelying to Sit Details (indicate cue  type and reason): patient relying heavily on upper extremity assit and HOB elevated to get to EOB Sit to Supine - Left: 6: Modified independent (Device/Increase time);With rail;HOB flat (patient relying heavily on rails to get back into bed.  ) Transfers Sit to Stand: 5: Supervision;From elevated surface;With upper extremity assist;From bed;From toilet Sit to Stand Details (indicate cue type and reason): supervsion for safety.   Stand to Sit: 5: Supervision;With upper extremity assist;To chair/3-in-1;To toilet Stand to Sit Details: uncontrolled descent to sit despite using arms to help lower to commode and chair.   Ambulation/Gait Ambulation/Gait Assistance: 4: Min assist Ambulation/Gait Assistance Details (indicate cue type and reason): min assist for stability and to help encourage upright trunk with tactile cues. Verbal cues for upright posture, patient unable to correct his flexed posture. increased trunk flexion with increased gait distance and fatigue.   Chair to follow to encourage increased gait distance and safety.   Ambulation Distance (Feet): 60 Feet Assistive device: Rolling walker Gait Pattern: Decreased stride length;Shuffle;Trunk flexed    Exercise    End of Session PT - End of Session Activity Tolerance: Patient limited by pain Patient left: in bed;with call bell in reach;with family/visitor present;Other (comment) (wife and work friend) Engineer, civil (consulting) Communication:  (need for pain meds and asking for order to go to solarium.) General Behavior During Session: [facility] for tasks performed Cognition: [facility] for tasks performed  Lurena Joiner B. Tijana Walder, PT, DPT [phone]   07/06/2011, 3:55 PM

## 2011-07-06 NOTE — Progress Notes (Signed)
CSW met with pt and his wife to provide bed offers. Currently, pt only has one and it does not appeal to pt and his wife. CSW left a message with facility of their choice to inquire about a bed offer. CSW provided information on SNF and discharging home with home health services. CSW reinforced the recommendation of SNF. CSW will continue to follow and provide additional bed offers.   Dede Query, MSW, [facility] [phone]

## 2011-07-06 NOTE — Progress Notes (Signed)
Patient ID: Jeff [name], male   DOB: 1960/12/12, 50 y.o.   MRN: [id number] Subjective:  The patient is alert. He continues to complain of right leg pain. He is at times tearful.  Objective: Vital signs in last 24 hours: Temp:  [97.8 F (36.6 C)-98.3 F (36.8 C)] 98.1 F (36.7 C) (12/03 1017) Pulse Rate:  [63-87] 63  (12/03 1017) Resp:  [16-20] 16  (12/03 1017) BP: (94-131)/(67-82) 101/72 mmHg (12/03 1017) SpO2:  [90 %-97 %] 92 % (12/03 1017)  Intake/Output from previous day: 12/02 0701 - 12/03 0700 In: -  Out: 1000 [Urine:1000] Intake/Output this shift: Total I/O In: 360 [P.O.:360] Out: -   Physical exam the patient is alert and oriented. He is moving all 4 extremities.  Lab Results: No results found for this basename: WBC:2,HGB:2,HCT:2,PLT:2 in the last 72 hours BMET No results found for this basename: NA:2,K:2,CL:2,CO2:2,GLUCOSE:2,BUN:2,CREATININE:2,CALCIUM:2 in the last 72 hours  Studies/Results: No results found.  Assessment/Plan: Postop day #14: The patient continues to complain of right leg pain. We will continue with symptomatic management. I reviewed the patient's postoperative films with several of my partners. They agreed is no surgical lesion.  Depression: I think he may benefit from seeing a psychiatrist. I discussed this with the patient and his wife. The patient is agreeable.  Placement: We are awaiting nursing home versus rehabilitation placement.  LOS: 14 days     Deeksha Cotrell D 07/06/2011, 1:06 PM

## 2011-07-06 NOTE — Progress Notes (Signed)
.  Utilization review completed. Anette Guarneri, RN, BSN. 07/06/11

## 2011-07-07 DIAGNOSIS — F438 Other reactions to severe stress: Secondary | ICD-10-CM

## 2011-07-07 LAB — CBC
HCT: 40.2 % (ref 39.0–52.0)
Hemoglobin: 14 g/dL (ref 13.0–17.0)
WBC: 7.6 10*3/uL (ref 4.0–10.5)

## 2011-07-07 LAB — DIFFERENTIAL
Basophils Absolute: 0.1 10*3/uL (ref 0.0–0.1)
Lymphocytes Relative: 38 % (ref 12–46)
Lymphs Abs: 2.9 10*3/uL (ref 0.7–4.0)
Monocytes Absolute: 0.9 10*3/uL (ref 0.1–1.0)
Monocytes Relative: 12 % (ref 3–12)
Neutro Abs: 3.5 10*3/uL (ref 1.7–7.7)

## 2011-07-07 MED ORDER — ENOXAPARIN SODIUM 40 MG/0.4ML ~~LOC~~ SOLN
40.0000 mg | SUBCUTANEOUS | Status: DC
  Administered 2011-07-07 – 2011-07-11 (×5): 40 mg via SUBCUTANEOUS
  Filled 2011-07-07 (×6): qty 0.4

## 2011-07-07 MED ORDER — DULOXETINE HCL 30 MG PO CPEP
30.0000 mg | ORAL_CAPSULE | Freq: Every day | ORAL | Status: DC
  Administered 2011-07-08 – 2011-07-11 (×4): 30 mg via ORAL
  Filled 2011-07-07 (×6): qty 1

## 2011-07-07 NOTE — Progress Notes (Signed)
Patient ID: Jeff [name], male   DOB: 24-Feb-1961, 50 y.o.   MRN: [id number] Subjective:  The patient is alert and pleasant. He looks and feels much better today. He still complains of pain in his right hip ring on his right leg. By the nurses report he is not able to tolerate the SCDs.  Objective: Vital signs in last 24 hours: Temp:  [97.4 F (36.3 C)-98.3 F (36.8 C)] 97.9 F (36.6 C) (12/04 0600) Pulse Rate:  [63-73] 66  (12/04 0600) Resp:  [16-20] 20  (12/04 0600) BP: (95-122)/(61-76) 122/76 mmHg (12/04 0600) SpO2:  [92 %-96 %] 92 % (12/04 0600) Weight:  [101.9 kg (224 lb 10.4 oz)] 224 lb 10.4 oz (101.9 kg) (12/04 0300)  Intake/Output from previous day: 12/03 0701 - 12/04 0700 In: 600 [P.O.:600] Out: -  Intake/Output this shift:    Physical exam the patient is alert and oriented. He is ambulating adequately.  Lab Results: No results found for this basename: WBC:2,HGB:2,HCT:2,PLT:2 in the last 72 hours BMET No results found for this basename: NA:2,K:2,CL:2,CO2:2,GLUCOSE:2,BUN:2,CREATININE:2,CALCIUM:2 in the last 72 hours  Studies/Results: No results found.  Assessment/Plan: Postop day #15: We are awaiting skilled nursing facility placement.  Depression/psychiatric issues: Dr. Mervyn Skeeters is seen the patient presently. I appreciate his help.  VTE prophylaxis: The patient still is not ambulating much. He is not wearing his SCDs. I'll start Lovenox.  Leukocytosis: I will repeat his CBC.  LOS: 15 days     Heydi Swango D 07/07/2011, 7:30 AM

## 2011-07-07 NOTE — Progress Notes (Signed)
Clinical Social Work Psychiatry  Assessment    Presenting Symptoms/Problems:  Patient reports after a recent surgery he is shaking all over and cannot control tremors, 10/10 pain throughout his back and legs, and depression with crying spells.  Patient reports a lot of frustration with recent surgery and outcomes of the surgery.  Psychiatric History: Patient denies any past psych history or hospitalizations.  Reports right now he may be depressed (he is unsure), but due to severity of pain and new onset of tremors, feelings of frustration/stress/and being overwhelmed he reports he is dealing with a lot.   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  Patient wife reports no safety concerns at this time dealing with SI.  Reports not past hospitalizations or attempts of SI.  Discussed with patient and wife the concern that patient wanted to take his own life, but all denied and reported that was the pain talking.  Wife reports due to the surgery she may no be able to take him home like he presents today, thus working on ST placement for patient at dc. Wife reports patient has been on pain medication for total of 4 years, but patient has never abused the drug or had any concerns of mistreatment.                       Emotional Health/Current Symptoms:     Suicide/Self harm: None reported at the current time and no past attempts or thoughts.      Psychotic/Dissociative Symptoms:  None reported or past reports of AH or VH.  Patient is not psychotic or internally preoccupied.      Attention/Behavioral Symptoms: During assessment patient was very tearful and displayed evidence of adjusting to new surgery and outcomes of surgery and depression.  Patient reports he is  in severe pain and wants it to stop.  Patient is logical and linear in conversation and thought process.  Patient reports frustrations and being stressed/overwhelmed.       Recent Loss/Stressor: new onset of shaking and recent surgery.  Patient ready to feel better and not be in pain.  Per family report and also patient no traumatic events or stressors.      Substance Abuse/Use: No illicit drugs, but some alcohol socially.       Disposition/Interpretive Summary:  Patient plan at dc will remain SNF.  Discussed options of providers in the community regarding mental heath follow up and counselors to decrease depression and help provide extra support during this adjustment period.  Patient hesitant due to his line of work, but would think about resources.  Will follow up and discussed case with attending CSW Maralyn Sago.    Ashley Jacobs, MSW LCSWA [phone]

## 2011-07-07 NOTE — Consult Note (Signed)
Patient Identification:  Jeff [name] Date of Evaluation:  07/07/2011   History of Present Illness:  Patient seen in the medical records reviewed. Patient reported a long history of the pain first in one leg then later on both legs were involved. After the medical management failed surgery was performed on the patient after discussing all the risks and benefits. After surgery patient currently have shakiness in both hands and he reported pain 10 out of 10 in the legs. Patient is overwhelmed and anxious because of the ongoing tremors and pain he is having. I spoke with him on length that for how many years he is on pain medication and what kind of pain medication he was on, he denies abusing any pain medications.  Patient has no past psych history.  Denies alcohol abuse or illicit drug abuse.  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  Mental status examination-patient is calm cooperative good in interview but he is anxious and is overwhelmed but denied suicidal or homicidal ideations adamantly is not hallucinating or delusional cognition-alert awake oriented x3 memory immediate recent remote fair attention concentration fair judgment improved insight and judgment intact  His wife has no safety concerns for him.    Past Medical History:     Past Medical History  Diagnosis Date  . Hypertension   . Tuberculosis     POSITIVE  TB SKIN TEST 1992.6 MTH TX        Past Surgical History  Procedure Date  . Cervical fusion   . Carpal tunnel release   . Knee arthroscopy   . Lumbar laminectomy/decompression microdiscectomy 06/22/2011    Procedure: LUMBAR LAMINECTOMY/DECOMPRESSION MICRODISCECTOMY;  Surgeon: Cristi Loron;  Location: [facility];  Service: Neurosurgery;  Laterality: N/A;  Thoracic Ten-Eleven,Thoracic Eleven-Twelve Laminectomy    Allergies: No Known Allergies  Current Medications:  Prior to Admission medications   Medication Sig Start  Date End Date Taking? Authorizing Provider  aspirin EC 81 MG tablet Take 81 mg by mouth daily.     Yes Historical Provider, MD  gabapentin (NEURONTIN) 300 MG capsule Take 300 mg by mouth 3 (three) times daily.     Yes Historical Provider, MD  metoprolol (LOPRESSOR) 100 MG tablet Take 100 mg by mouth 2 (two) times daily.     Yes Historical Provider, MD  oxyCODONE (OXYCONTIN) 20 MG 12 hr tablet Take 20 mg by mouth every 12 (twelve) hours as needed. For pain.    Yes Historical Provider, MD  potassium chloride SA (K-DUR,KLOR-CON) 20 MEQ tablet Take 20 mEq by mouth daily.     Yes Historical Provider, MD  Testosterone (AXIRON) 30 MG/ACT SOLN Place 30 mg onto the skin daily. One squirt under each arm.    Yes Historical Provider, MD  valsartan-hydrochlorothiazide (DIOVAN-HCT) 160-25 MG per tablet Take 1 tablet by mouth daily.     Yes Historical Provider, MD    Social History:    does not have a smoking history on file. He does not have any smokeless tobacco history on file. He reports that he drinks alcohol. His drug history not on file.   Family History:    History reviewed. No pertinent family history.   DIAGNOSIS:   AXIS I  Adjustment disorder mixed type   AXIS II  Deffered  AXIS III See medical notes.  AXIS IV  chronic medical issues   AXIS V 51-60 moderate symptoms     Recommendations:  #1 supportive psychotherapy  and psychoeducation given to the patient #2 patient  started on Cymbalta 30 mg by mouth daily side effects and benefits of the medication discussed with the patient #3 this social worker will discuss different options available in the outpatient setting with the patient. #4 I will followup as needed.  Eulogio Ditch, MD

## 2011-07-07 NOTE — Progress Notes (Signed)
CSW met with pt to provide information re mental health treatment. CSW also addressed furhter bed offers. Pt is agreed to CSW expand the SNF search to find a SNF. CSW will continue to facilitate discharge to SNF.   Dede Query, MSW, [facility] [phone]

## 2011-07-08 ENCOUNTER — Encounter (HOSPITAL_COMMUNITY): Payer: Self-pay

## 2011-07-08 NOTE — Progress Notes (Signed)
Pt was dicussed at [employer] of Stay Meeting.   Dede Query, MSW, [facility] [phone]

## 2011-07-08 NOTE — Progress Notes (Signed)
Patient ID: Jeff [name], male   DOB: March 19, 1961, 50 y.o.   MRN: [id number] Subjective:  The patient is alert and pleasant. He is in no apparent distress. He does continue to complain of pain in his right foot. Patient complains that the psychiatrist placed him on Cymbalta without his consent. We discussed this at length.  Objective: Vital signs in last 24 hours: Temp:  [97.5 F (36.4 C)-98.5 F (36.9 C)] 98.4 F (36.9 C) (12/05 1810) Pulse Rate:  [61-67] 61  (12/05 1810) Resp:  [20-22] 20  (12/05 1810) BP: (106-128)/(68-81) 113/73 mmHg (12/05 1810) SpO2:  [90 %-96 %] 96 % (12/05 1810)  Intake/Output from previous day: 12/04 0701 - 12/05 0700 In: 1650 [P.O.:1650] Out: 400 [Urine:400] Intake/Output this shift: Total I/O In: 360 [P.O.:360] Out: -   Physical exam the patient is moving all extremities well.  Lab Results:  [facility] 07/07/11 0837  WBC 7.6  HGB 14.0  HCT 40.2  PLT 228   BMET No results found for this basename: NA:2,K:2,CL:2,CO2:2,GLUCOSE:2,BUN:2,CREATININE:2,CALCIUM:2 in the last 72 hours  Studies/Results: No results found.  Assessment/Plan: Postop day #16: I have had another long discussion with the patient and his wife. I told him I don't think I can do anything more surgically to help him with his pain. I recommend we continue with pain management. I will plan to refer to a pain management doctor. We discussed the possibility of complex regional pain syndrome. We are awaiting a rehabilitation bed.  Depression/pain: I discussed this with him as well. We discussed starting him on Cymbalta. I've told him we may be able to "kill 2 birds with one stone". I.e. treat his depression and pain with Cymbalta. I discussed the possible side effects of Cymbalta and answer all their questions. He would like to give it a try.  LOS: 16 days     Shawnita Krizek D 07/08/2011, 6:46 PM

## 2011-07-08 NOTE — Progress Notes (Signed)
Physical Therapy Treatment Patient Details Name: Jeff [name] MRN: [id number] DOB: 1961-06-02 Today's Date: 07/08/2011  PT Assessment/Plan  PT - Assessment/Plan Comments on Treatment Session: The patient continues to improve his mobility with practice.  His main limiting factor is pain that keeps him from going further with his gait, but even then he goes a little further every time.   PT Plan: Discharge plan remains appropriate PT Frequency: Min 3X/week Follow Up Recommendations: Skilled nursing facility Equipment Recommended: Defer to next venue PT Goals  Acute Rehab PT Goals PT Goal: Supine/Side to Sit - Progress: Progressing toward goal PT Goal: Sit to Stand - Progress: Progressing toward goal PT Goal: Stand to Sit - Progress: Progressing toward goal PT Goal: Ambulate - Progress: Progressing toward goal Additional Goals PT Goal: Additional Goal #1 - Progress: Progressing toward goal  PT Treatment Precautions/Restrictions  Precautions Precautions: Back Precaution Booklet Issued: Yes (comment) (Back handout given.) Precaution Comments: Pt was sitting EOB and started to twist to look behind him and reminded him self no twisting Required Braces or Orthoses: No Restrictions Weight Bearing Restrictions: No Mobility (including Balance) Bed Mobility Rolling Left: 6: Modified independent (Device/Increase time);With rail Rolling Left Details (indicate cue type and reason): patient using rail to get to side Left Sidelying to Sit: 6: Modified independent (Device/Increase time);HOB elevated (comment degrees);With rails (HOB 45) Left Sidelying to Sit Details (indicate cue type and reason): patient using bedrail to get to sitting.   Transfers Sit to Stand: 5: Supervision;From bed;With upper extremity assist;With armrests;From chair/3-in-1 Sit to Stand Details (indicate cue type and reason): supervision for safety Stand to Sit: 4: Min assist;With upper extremity assist;With armrests;To  bed;To chair/3-in-1 Stand to Sit Details: min assist to help control descent to sit, still with help and arms patient had uncontrolled descent to low surface.   Ambulation/Gait Ambulation/Gait Assistance: 5: Supervision;Other (comment) (with chair to follow to encourage increased gait distance) Ambulation/Gait Assistance Details (indicate cue type and reason): supervision for safety, verbal cues for upright posture, but patient remains flexed even with cueing.   Ambulation Distance (Feet): 80 Feet Assistive device: Rolling walker Gait Pattern: Step-through pattern;Decreased stance time - right;Shuffle;Trunk flexed    Exercise    End of Session PT - End of Session Activity Tolerance: Patient limited by fatigue;Patient limited by pain Patient left: in bed;with call bell in reach;with family/visitor present (wife and daughter in room at end of treatment, boss as well) General Behavior During Session: [facility] for tasks performed Cognition: [facility] for tasks performed  Trevionne Advani B. Violette Morneault, PT, DPT [phone]  07/08/2011, 3:36 PM

## 2011-07-09 ENCOUNTER — Inpatient Hospital Stay (HOSPITAL_COMMUNITY): Payer: 59

## 2011-07-09 NOTE — Progress Notes (Signed)
Patient's family is requesting to meet with Physician- will attempt to advise Dr. Lovell Sheehan of this today and assist in arrangements. CSW continues to work with SNF for placement- will need a Chest XRay that specifically states "NO EVIDENCE OF TB DISEASE". Will attempt to speak with Dr. Lovell Sheehan regarding this as well.  Reece Levy, MSW, [facility] [phone]

## 2011-07-09 NOTE — Progress Notes (Signed)
This patient was discussed at long LOS rounds 12.05.12  

## 2011-07-10 MED ORDER — DIAZEPAM 10 MG PO TABS: 10.0000 mg | ORAL_TABLET | Freq: Four times a day (QID) | ORAL | Status: AC | PRN

## 2011-07-10 MED ORDER — DULOXETINE HCL 30 MG PO CPEP: 30.0000 mg | ORAL_CAPSULE | Freq: Every day | ORAL | Status: AC

## 2011-07-10 MED ORDER — DSS 100 MG PO CAPS: 100.0000 mg | ORAL_CAPSULE | Freq: Two times a day (BID) | ORAL | Status: AC

## 2011-07-10 MED ORDER — OXYCODONE HCL 60 MG PO TB12: 60.0000 mg | ORAL_TABLET | Freq: Two times a day (BID) | ORAL | Status: AC

## 2011-07-10 MED ORDER — GABAPENTIN 400 MG PO CAPS: 1200.0000 mg | ORAL_CAPSULE | Freq: Three times a day (TID) | ORAL | Status: AC

## 2011-07-10 MED ORDER — OXYCODONE-ACETAMINOPHEN 5-325 MG PO TABS: 1.0000 | ORAL_TABLET | ORAL | Status: AC | PRN

## 2011-07-10 NOTE — Progress Notes (Signed)
Clinical Social Worker met with pt and wife. Pt is medically ready for discharge to [facility], but the facility is requiring a second CXR to rule out TB, as recommended by the radiologist. Pt's wife completed admission paperwork at [facility]. CSW sent the discharge paperwork to facility. Pt is expected to discharge to [facility] for continued rehab on Saturday, 07/11/11. Weekend Coverage CSW will facilitate a Saturday discharge to SNF.   Dede Query, MSW, [facility] [phone]

## 2011-07-10 NOTE — Discharge Summary (Signed)
Physician Discharge Summary  Patient ID: Jeff [name] MRN: [id number] DOB/AGE: 1961/04/05 50 y.o.  Admit date: 06/22/2011 Discharge date: 07/10/2011  Admission Diagnoses:  Discharge Diagnoses:  Principal Problem:  *Thoracic spinal stenosis   Discharged Condition: fair  Hospital Course: I admitted the patient to [facility] hospital with a diagnosis of thoracic stenosis. On the day of admission I performed a thoracic laminectomy. The surgery went well. Postoperatively the patient complains of severe right leg pain.  We worked his leg pain up with 2 lumbar and thoracic MRIs. These did not demonstrate any explanation for his pain. He was further worked up with lower extremity ultrasound to rule out DVT which was negative. He also had a lower extremity noninvasive arterial studies which demonstrated no evidence of peripheral vascular disease.  We've treated the patient symptomatically with OxyContin. We tried Lyrica the patient cannot tolerate side effects. Patient Neurontin dose was increased.  The patient complained of tremors. We had neurology see the patient. They had no explanation for his tremors.  Patient times was tearful and seemed depressed. We have a psychiatrist see him. He recommended we start Cymbalta.  We had PT, OT, and rehabilitation see the patient. They did not think he qualifies for inpatient rehabilitation. They recommended skilled nursing facility placement. These arrangements were made.  The skilled nursing facility requested a x-ray demonstrated no evidence of tuberculosis. He had one x-ray which demonstrated some evidence of atelectasis. And they required a second x-ray it will be done tomorrow. If it is stable he will be transferred to the skilled nursing facility.  I discussed these arrangements with the patient his wife. A agreeable to this plan. I gave the patient and his wife oral discharge instructions and answer all her questions. I instructed them to,  office for followup appointment.  Consults: Neurology, psychiatry Significant Diagnostic Studies: 2 lumbar and thoracic postoperative MRIs, lower extremity ultrasound route DVT and peripheral breast disease. Treatments: Thoracic laminectomy for stenosis Discharge Exam: Blood pressure 127/81, pulse 53, temperature 98 F (36.7 C), temperature source Oral, resp. rate 18, height 5\' 8"  (1.727 m), weight 101.9 kg (224 lb 10.4 oz), SpO2 94.00%. The patient is alert and oriented. He is moving all 4 extremities. His wound is healing well without signs of infection.  Disposition: Skilled nursing facility  Discharge Orders    Future Orders Please Complete By Expires   Diet - low sodium heart healthy      Increase activity slowly      Discharge instructions      Comments:   The patient was given oral and written discharge instructions. I have answered all the patient and his wife's questions.   No wound care      Call MD for:  temperature >100.4      Call MD for:  persistant nausea and vomiting      Call MD for:  severe uncontrolled pain      Call MD for:  redness, tenderness, or signs of infection (pain, swelling, redness, odor or green/yellow discharge around incision site)      Call MD for:  difficulty breathing, headache or visual disturbances      Call MD for:  hives      Call MD for:  persistant dizziness or light-headedness      Call MD for:  extreme fatigue        Current Discharge Medication List    START taking these medications   Details  diazepam (VALIUM) 10 MG tablet Take  1 tablet (10 mg total) by mouth every 6 (six) hours as needed. Qty: 50 tablet, Refills: 1    docusate sodium 100 MG CAPS Take 100 mg by mouth 2 (two) times daily. Qty: 60 capsule, Refills: 1    DULoxetine (CYMBALTA) 30 MG capsule Take 1 capsule (30 mg total) by mouth daily. Qty: 30 capsule, Refills: 1    oxyCODONE-acetaminophen (PERCOCET) 5-325 MG per tablet Take 1-2 tablets by mouth every 4 (four) hours  as needed. Qty: 100 tablet, Refills: 0      CONTINUE these medications which have CHANGED   Details  gabapentin (NEURONTIN) 400 MG capsule Take 3 capsules (1,200 mg total) by mouth 3 (three) times daily. Qty: 270 capsule, Refills: 1    oxyCODONE 60 MG TB12 Take 1 tablet (60 mg total) by mouth every 12 (twelve) hours. Qty: 60 tablet, Refills: 0      CONTINUE these medications which have NOT CHANGED   Details  aspirin EC 81 MG tablet Take 81 mg by mouth daily.      metoprolol (LOPRESSOR) 100 MG tablet Take 100 mg by mouth 2 (two) times daily.      potassium chloride SA (K-DUR,KLOR-CON) 20 MEQ tablet Take 20 mEq by mouth daily.      Testosterone (AXIRON) 30 MG/ACT SOLN Place 30 mg onto the skin daily. One squirt under each arm.     valsartan-hydrochlorothiazide (DIOVAN-HCT) 160-25 MG per tablet Take 1 tablet by mouth daily.           SignedCristi Loron 07/10/2011, 12:32 PM

## 2011-07-10 NOTE — Progress Notes (Signed)
Patient ID: Jeff [name], male   DOB: Feb 28, 1961, 50 y.o.   MRN: [id number] Subjective:  The patient is alert and pleasant. He is in no apparent distress. He looks much better today. He continues to complain of right leg and foot pain.  Objective: Vital signs in last 24 hours: Temp:  [97.4 F (36.3 C)-98.3 F (36.8 C)] 97.4 F (36.3 C) (12/07 0600) Pulse Rate:  [58-65] 64  (12/07 0600) Resp:  [19-20] 20  (12/07 0600) BP: (100-156)/(69-88) 100/69 mmHg (12/07 0600) SpO2:  [95 %-96 %] 95 % (12/07 0600)  Intake/Output from previous day: 12/06 0701 - 12/07 0700 In: 240 [P.O.:240] Out: 702 [Urine:701; Stool:1] Intake/Output this shift:    Physical exam the patient is alert and oriented. He is moving all 4 extremities well.  Lab Results:  [facility] 07/07/11 0837  WBC 7.6  HGB 14.0  HCT 40.2  PLT 228   BMET No results found for this basename: NA:2,K:2,CL:2,CO2:2,GLUCOSE:2,BUN:2,CREATININE:2,CALCIUM:2 in the last 72 hours  Studies/Results: Dg Chest 1 View  07/09/2011  *RADIOLOGY REPORT*  Clinical Data: Rule out TB.  Nursing home placement  CHEST - 1 VIEW  Comparison: 06/29/2011  Findings: Heart size is upper normal.  Negative for heart failure.  Mild right lower lobe airspace disease has developed since the prior study.  This may represent atelectasis or pneumonia.  This would  be best evaluated with a follow-up two-view chest x-ray. This is not likely to  represent active tuberculosis however follow- up is warranted.  Lung apices are clear.  IMPRESSION: Interval development of mild right lower lobe airspace disease. This may represent atelectasis or pneumonia.  Follow-up two-view chest x-ray is suggested for further evaluation.  Original Report Authenticated By: Camelia Phenes, M.D.    Assessment/Plan: Postop day #18: We are awaiting nursing home placement.  LOS: 18 days     Cassandra Mcmanaman D 07/10/2011, 7:23 AM

## 2011-07-10 NOTE — Progress Notes (Signed)
Physical Therapy Treatment Patient Details Name: Jeff [name] MRN: [id number] DOB: 11-22-1960 Today's Date: 07/10/2011  PT Assessment/Plan  PT - Assessment/Plan Comments on Treatment Session: Pt still has significant amounts of pain in right leg.  Pt agreeable to participate in PT session today even though he states he doesnt feel well.   PT Plan: Discharge plan remains appropriate PT Frequency: Min 3X/week Follow Up Recommendations: Skilled nursing facility Equipment Recommended: Defer to next venue PT Goals  Acute Rehab PT Goals PT Goal: Supine/Side to Sit - Progress: Met PT Goal: Sit to Stand - Progress: Not met PT Goal: Stand to Sit - Progress: Not met PT Goal: Ambulate - Progress: Progressing toward goal  PT Treatment Precautions/Restrictions  Precautions Precautions: Back Precaution Booklet Issued: Yes (comment) (Back handout given.) Precaution Comments: Ocassional cues to remind pt of back precautions (especially no twisting).   Required Braces or Orthoses: No Restrictions Weight Bearing Restrictions: No Mobility (including Balance) Bed Mobility Rolling Left: 6: Modified independent (Device/Increase time);With rail Left Sidelying to Sit: 6: Modified independent (Device/Increase time);With rails;HOB elevated (comment degrees) (HOB 25) Transfers Stand to Sit: 4: Min assist;To chair/3-in-1;To toilet;With upper extremity assist;With armrests Stand to Sit Details: Min (A) to control descent to recliner after ambulating.  (S) for stand>sit to toilet with pt using hand rail- Cues for hand placement  Ambulation/Gait Ambulation/Gait Assistance: 3: Mod assist;Other (comment) (Min Guard (A) with Mod (A) at end of ambulation distance. ) Ambulation/Gait Assistance Details (indicate cue type and reason): Required Mod (A) for balance & safety at end of ambulation due to pt c/o of significant increase of pain & unable to hold himself up any longer.  Recliner was brought up to pt for pt  to sit.   Ambulation Distance (Feet): 90 Feet Assistive device: Rolling walker Gait Pattern: Step-through pattern;Shuffle;Decreased stance time - right    Exercise    End of Session PT - End of Session Equipment Utilized During Treatment: Gait belt Activity Tolerance: Patient limited by pain;Patient limited by fatigue Patient left: in chair;with call bell in reach;with family/visitor present General Behavior During Session: [facility] for tasks performed Cognition: [facility] for tasks performed  [name] 07/10/2011, 10:27 AM [phone]

## 2011-07-11 ENCOUNTER — Inpatient Hospital Stay (HOSPITAL_COMMUNITY): Payer: 59

## 2011-07-11 NOTE — Progress Notes (Signed)
Subjective: Patient repo rts No changes patient reports discharge plan today  Objective: Vital signs in last 24 hours: Temp:  [97.7 F (36.5 C)-98.8 F (37.1 C)] 98.7 F (37.1 C) (12/08 0500) Pulse Rate:  [52-57] 56  (12/08 0500) Resp:  [16-18] 18  (12/08 0500) BP: (94-127)/(57-81) 103/66 mmHg (12/08 0500) SpO2:  [93 %-96 %] 93 % (12/08 0500)  Intake/Output from previous day: 12/07 0701 - 12/08 0700 In: 480 [P.O.:480] Out: -  Intake/Output this shift:    No changes  Lab Results: No results found for this basename: WBC:2,HGB:2,HCT:2,PLT:2 in the last 72 hours BMET No results found for this basename: NA:2,K:2,CL:2,CO2:2,GLUCOSE:2,BUN:2,CREATININE:2,CALCIUM:2 in the last 72 hours  Studies/Results: Dg Chest 1 View  07/09/2011  *RADIOLOGY REPORT*  Clinical Data: Rule out TB.  Nursing home placement  CHEST - 1 VIEW  Comparison: 06/29/2011  Findings: Heart size is upper normal.  Negative for heart failure.  Mild right lower lobe airspace disease has developed since the prior study.  This may represent atelectasis or pneumonia.  This would  be best evaluated with a follow-up two-view chest x-ray. This is not likely to  represent active tuberculosis however follow- up is warranted.  Lung apices are clear.  IMPRESSION: Interval development of mild right lower lobe airspace disease. This may represent atelectasis or pneumonia.  Follow-up two-view chest x-ray is suggested for further evaluation.  Original Report Authenticated By: Camelia Phenes, M.D.   Dg Chest 2 View  07/11/2011  *RADIOLOGY REPORT*  Clinical Data: Weakness.  Postop thoracic spine surgery.  CHEST - 2 VIEW  Comparison: 07/09/2011  Findings: No confluent airspace opacities.  Heart is upper limits normal in size.  No effusions or acute bony abnormality.  IMPRESSION: No acute findings.  Original Report Authenticated By: Cyndie Chime, M.D.    Assessment/Plan: Discharge summary performed by Dr. Lovell Sheehan  LOS: 19 days  Discharge  home   Jeff [name] J 07/11/2011, 10:23 AM

## 2011-07-11 NOTE — Progress Notes (Signed)
Pt to transfer to [facility] today via family transport. Pt declined ambulance transport. No other CSW needs reported. CSW signing off.  Dellie Burns, MSW, [place] [phone] (weekend)

## 2012-06-06 ENCOUNTER — Telehealth: Payer: Self-pay

## 2012-06-06 NOTE — Telephone Encounter (Signed)
Dr. Sullivan Lone asks that you call him about this pt at your convenience

## 2012-06-07 NOTE — Telephone Encounter (Signed)
Called dr Sullivan Lone back Must have been a mistake

## 2012-08-02 DIAGNOSIS — G5710 Meralgia paresthetica, unspecified lower limb: Secondary | ICD-10-CM | POA: Insufficient documentation

## 2012-08-02 DIAGNOSIS — M961 Postlaminectomy syndrome, not elsewhere classified: Secondary | ICD-10-CM | POA: Insufficient documentation

## 2012-08-02 DIAGNOSIS — Z79899 Other long term (current) drug therapy: Secondary | ICD-10-CM | POA: Insufficient documentation

## 2012-08-03 HISTORY — PX: CATARACT EXTRACTION: SUR2

## 2012-08-03 HISTORY — PX: CORONARY ANGIOPLASTY: SHX604

## 2012-08-17 DIAGNOSIS — G894 Chronic pain syndrome: Secondary | ICD-10-CM | POA: Insufficient documentation

## 2012-12-28 ENCOUNTER — Ambulatory Visit: Payer: Self-pay | Admitting: Ophthalmology

## 2013-01-10 ENCOUNTER — Ambulatory Visit: Payer: Self-pay | Admitting: Ophthalmology

## 2013-01-27 ENCOUNTER — Emergency Department (HOSPITAL_COMMUNITY): Payer: 59

## 2013-01-27 ENCOUNTER — Inpatient Hospital Stay (HOSPITAL_COMMUNITY)
Admission: EM | Admit: 2013-01-27 | Discharge: 2013-01-31 | DRG: 287 | Disposition: A | Discharge status: Home / Self Care | Payer: 59 | Attending: Internal Medicine | Admitting: Internal Medicine

## 2013-01-27 ENCOUNTER — Encounter (HOSPITAL_COMMUNITY): Payer: Self-pay | Admitting: *Deleted

## 2013-01-27 DIAGNOSIS — H518 Other specified disorders of binocular movement: Secondary | ICD-10-CM | POA: Diagnosis present

## 2013-01-27 DIAGNOSIS — I498 Other specified cardiac arrhythmias: Secondary | ICD-10-CM | POA: Diagnosis present

## 2013-01-27 DIAGNOSIS — I2510 Atherosclerotic heart disease of native coronary artery without angina pectoris: Secondary | ICD-10-CM | POA: Diagnosis present

## 2013-01-27 DIAGNOSIS — Z87891 Personal history of nicotine dependence: Secondary | ICD-10-CM

## 2013-01-27 DIAGNOSIS — M4804 Spinal stenosis, thoracic region: Secondary | ICD-10-CM

## 2013-01-27 DIAGNOSIS — R51 Headache: Secondary | ICD-10-CM | POA: Diagnosis present

## 2013-01-27 DIAGNOSIS — R001 Bradycardia, unspecified: Secondary | ICD-10-CM

## 2013-01-27 DIAGNOSIS — M546 Pain in thoracic spine: Secondary | ICD-10-CM | POA: Diagnosis present

## 2013-01-27 DIAGNOSIS — Z8611 Personal history of tuberculosis: Secondary | ICD-10-CM

## 2013-01-27 DIAGNOSIS — R569 Unspecified convulsions: Secondary | ICD-10-CM

## 2013-01-27 DIAGNOSIS — R072 Precordial pain: Secondary | ICD-10-CM | POA: Diagnosis present

## 2013-01-27 DIAGNOSIS — Z8249 Family history of ischemic heart disease and other diseases of the circulatory system: Secondary | ICD-10-CM

## 2013-01-27 DIAGNOSIS — E876 Hypokalemia: Secondary | ICD-10-CM | POA: Diagnosis present

## 2013-01-27 DIAGNOSIS — R0789 Other chest pain: Secondary | ICD-10-CM | POA: Diagnosis present

## 2013-01-27 DIAGNOSIS — R079 Chest pain, unspecified: Secondary | ICD-10-CM

## 2013-01-27 DIAGNOSIS — R209 Unspecified disturbances of skin sensation: Secondary | ICD-10-CM | POA: Diagnosis present

## 2013-01-27 DIAGNOSIS — R55 Syncope and collapse: Secondary | ICD-10-CM

## 2013-01-27 DIAGNOSIS — Z7982 Long term (current) use of aspirin: Secondary | ICD-10-CM

## 2013-01-27 DIAGNOSIS — M542 Cervicalgia: Secondary | ICD-10-CM | POA: Diagnosis present

## 2013-01-27 DIAGNOSIS — R7989 Other specified abnormal findings of blood chemistry: Secondary | ICD-10-CM

## 2013-01-27 HISTORY — DX: Myoneural disorder, unspecified: G70.9

## 2013-01-27 LAB — BASIC METABOLIC PANEL
CO2: 24 mEq/L (ref 19–32)
Calcium: 8.3 mg/dL — ABNORMAL LOW (ref 8.4–10.5)
Chloride: 105 mEq/L (ref 96–112)
Creatinine, Ser: 1.28 mg/dL (ref 0.50–1.35)
GFR calc Af Amer: 72 mL/min — ABNORMAL LOW (ref >90)
GFR calc non Af Amer: 62 mL/min — ABNORMAL LOW (ref >90)
Glucose, Bld: 91 mg/dL (ref 70–99)
Potassium: 4.3 mEq/L (ref 3.5–5.1)

## 2013-01-27 LAB — CBC WITH DIFFERENTIAL/PLATELET
Basophils Absolute: 0 K/uL (ref 0.0–0.1)
Basophils Relative: 0 % (ref 0–1)
Eosinophils Absolute: 0.2 10*3/uL (ref 0.0–0.7)
Eosinophils Relative: 2 % (ref 0–5)
HCT: 38.2 % — ABNORMAL LOW (ref 39.0–52.0)
Hemoglobin: 13.8 g/dL (ref 13.0–17.0)
Lymphocytes Relative: 38 % (ref 12–46)
Lymphs Abs: 3.1 10*3/uL (ref 0.7–4.0)
MCH: 33.3 pg (ref 26.0–34.0)
MCHC: 36.1 g/dL — ABNORMAL HIGH (ref 30.0–36.0)
MCV: 92 fL (ref 78.0–100.0)
Monocytes Absolute: 0.8 K/uL (ref 0.1–1.0)
Monocytes Relative: 9 % (ref 3–12)
Neutro Abs: 4.3 10*3/uL (ref 1.7–7.7)
Neutrophils Relative %: 51 % (ref 43–77)
Platelets: 205 K/uL (ref 150–400)
RBC: 4.15 MIL/uL — ABNORMAL LOW (ref 4.22–5.81)
RDW: 12.4 % (ref 11.5–15.5)
WBC: 8.4 K/uL (ref 4.0–10.5)

## 2013-01-27 LAB — POCT I-STAT TROPONIN I: Troponin i, poc: 0 ng/mL (ref 0.00–0.08)

## 2013-01-27 LAB — BASIC METABOLIC PANEL WITH GFR
BUN: 24 mg/dL — ABNORMAL HIGH (ref 6–23)
Sodium: 137 meq/L (ref 135–145)
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
## 2013-01-27 NOTE — ED Notes (Signed)
Attempted to call report. Floor RN unable to accept report.  

## 2013-01-27 NOTE — ED Provider Notes (Signed)
History    CSN: [id number] Arrival date & time 01/27/13  1843  First MD Initiated Contact with Patient 01/27/13 1856     Chief Complaint  Patient presents with  . Chest Pain  . Bradycardia   (Consider location/radiation/quality/duration/timing/severity/associated sxs/prior Treatment) Patient is a 52 y.o. male presenting with chest pain. The history is provided by the patient.  Chest Pain Pain location:  Substernal area Pain quality: sharp   Pain radiates to:  L shoulder Pain radiates to the back: no   Pain severity:  Moderate Onset quality:  Sudden Duration:  1 day Timing:  Intermittent Progression:  Unchanged Chronicity:  New Context: at rest   Relieved by:  Nothing Worsened by:  Nothing tried Ineffective treatments:  None tried Associated symptoms: nausea and shortness of breath   Associated symptoms: no abdominal pain, no cough, no dizziness, no fever, no headache, not vomiting and no weakness    Past Medical History  Diagnosis Date  . Hypertension   . Tuberculosis     POSITIVE  TB SKIN TEST 1992.6 MTH TX    Past Surgical History  Procedure Laterality Date  . Cervical fusion    . Carpal tunnel release    . Knee arthroscopy    . Lumbar laminectomy/decompression microdiscectomy  06/22/2011    Procedure: LUMBAR LAMINECTOMY/DECOMPRESSION MICRODISCECTOMY;  Surgeon: Cristi Loron;  Location: [facility];  Service: Neurosurgery;  Laterality: N/A;  Thoracic Ten-Eleven,Thoracic Eleven-Twelve Laminectomy   No family history on file. History  Substance Use Topics  . Smoking status: Former Smoker -- 1.50 packs/day for 10 years    Types: Cigarettes    Quit date: 07/08/1995  . Smokeless tobacco: Former Neurosurgeon    Quit date: 12/16/1995  . Alcohol Use: Yes    Review of Systems  Constitutional: Negative for fever and chills.  HENT: Negative for neck pain.   Respiratory: Positive for shortness of breath. Negative for cough and chest tightness.   Cardiovascular:  Positive for chest pain.  Gastrointestinal: Positive for nausea. Negative for vomiting, abdominal pain and diarrhea.  Genitourinary: Negative for dysuria.  Neurological: Negative for dizziness, weakness and headaches.  All other systems reviewed and are negative.    Allergies  Review of patient's allergies indicates no known allergies.  Home Medications   Current Outpatient Rx  Name  Route  Sig  Dispense  Refill  . aspirin EC 81 MG tablet   Oral   Take 81 mg by mouth daily.           . metoprolol (LOPRESSOR) 100 MG tablet   Oral   Take 100 mg by mouth 2 (two) times daily.           . potassium chloride SA (K-DUR,KLOR-CON) 20 MEQ tablet   Oral   Take 20 mEq by mouth daily.           . valsartan-hydrochlorothiazide (DIOVAN-HCT) 160-25 MG per tablet   Oral   Take 1 tablet by mouth daily.            BP 102/68  Pulse 56  Temp(Src) 98.6 F (37 C) (Oral)  Resp 14  SpO2 100% Physical Exam  Nursing note and vitals reviewed. Constitutional: He is oriented to person, place, and time. He appears well-developed and well-nourished. No distress.  HENT:  Head: Normocephalic and atraumatic.  Right Ear: External ear normal.  Left Ear: External ear normal.  Mouth/Throat: Oropharynx is clear and moist.  Eyes: Pupils are equal, round, and reactive to light.  Neck: Normal range of motion. Neck supple.  Cardiovascular: Regular rhythm, normal heart sounds and intact distal pulses.  Exam reveals no gallop and no friction rub.   No murmur heard. Bradycardic.   Pulmonary/Chest: Effort normal and breath sounds normal. No respiratory distress. He has no wheezes. He has no rales.  Abdominal: Soft. There is no tenderness. There is no rebound and no guarding.  Musculoskeletal: Normal range of motion. He exhibits no edema and no tenderness.  Lymphadenopathy:    He has no cervical adenopathy.  Neurological: He is alert and oriented to person, place, and time.  Skin: Skin is warm and  dry. No rash noted. No erythema.  Psychiatric: He has a normal mood and affect. His behavior is normal.    ED Course  Procedures (including critical care time) Labs Reviewed  CBC WITH DIFFERENTIAL - Abnormal; Notable for the following:    RBC 4.15 (*)    HCT 38.2 (*)    MCHC 36.1 (*)    All other components within normal limits  BASIC METABOLIC PANEL - Abnormal; Notable for the following:    BUN 24 (*)    Calcium 8.3 (*)    GFR calc non Af Amer 62 (*)    GFR calc Af Amer 72 (*)    All other components within normal limits  POCT I-STAT TROPONIN I   Dg Chest Portable 1 View  01/27/2013   *RADIOLOGY REPORT*  Clinical Data: Chest pain, shortness of breath, weakness, bradycardia  PORTABLE CHEST - 1 VIEW  Comparison: 07/11/2011; 07/09/2011  Findings: Grossly unchanged enlarged cardiac silhouette and mediastinal contours with apparent differences kilovolt to decreased lung volumes and AP projection.  The transcutaneous pacer past overlying the left heart border.  Mild pulmonary venous congestion without frank evidence of edema.  Minimal bibasilar opacities, left greater than right.  No focal airspace opacity.  No definite pleural effusion.  No pneumothorax.  Unchanged bones including lower cervical ACDF.  IMPRESSION: Pulmonary congestion without definite evidence of edema on this low lung volumes AP portable exam.   Original Report Authenticated By: Tacey Ruiz, MD   Date: 01/27/2013  Rate: 57  Rhythm: sinus bradycardia  QRS Axis: normal  Intervals: normal  ST/T Wave abnormalities: normal  Conduction Disutrbances:none  Narrative Interpretation:   Old EKG Reviewed: unchanged   1. Chest pain   2. Bradycardia     MDM  43:78 PM a 52 year old male with history of hypertension presenting with intermittent chest pain and noted to be bradycardic in the field. Per EMS patient would become less responsive when heart rate would go down. It went as low as 42. Patient describes intermittent  episodes of a sharp substernal chest pain radiating to his left shoulder with shortness of breath and mild nausea but no diaphoresis. He states episode lasts several minutes at a time. Denies history of similar in the past. He has no personal history of heart disease but does have a family history of CAD. Patient alert and oriented arrival. Heart rate in the low to mid 50s. He states his normal heart rate is in the low 50s. We'll check labs x-ray, EKG and troponin.  10:45PM: Labs show no significant abnormality. Patient noted to have an episode where he nearly passed out, but was noted to maintain heart rate in the 60s. This certainly could be heart rate related, but episode in ED occurred with no change in heart rate. Medicine consulted and patient admitted for further management.  Caren Hazy, MD  01/28/13 0144 

## 2013-01-27 NOTE — ED Notes (Signed)
Pt had episode of LOC, lasted approximately 1-2 minutes. HR maintained in 50's during episode. BP 110/70.

## 2013-01-28 ENCOUNTER — Inpatient Hospital Stay (HOSPITAL_COMMUNITY): Payer: 59

## 2013-01-28 ENCOUNTER — Encounter (HOSPITAL_COMMUNITY): Payer: Self-pay | Admitting: *Deleted

## 2013-01-28 DIAGNOSIS — R079 Chest pain, unspecified: Secondary | ICD-10-CM

## 2013-01-28 DIAGNOSIS — R569 Unspecified convulsions: Secondary | ICD-10-CM | POA: Diagnosis present

## 2013-01-28 DIAGNOSIS — I369 Nonrheumatic tricuspid valve disorder, unspecified: Secondary | ICD-10-CM

## 2013-01-28 DIAGNOSIS — R55 Syncope and collapse: Secondary | ICD-10-CM | POA: Diagnosis present

## 2013-01-28 LAB — BASIC METABOLIC PANEL
BUN: 18 mg/dL (ref 6–23)
Chloride: 108 mEq/L (ref 96–112)
Creatinine, Ser: 1.07 mg/dL (ref 0.50–1.35)
GFR calc Af Amer: 89 mL/min — ABNORMAL LOW (ref >90)
Glucose, Bld: 119 mg/dL — ABNORMAL HIGH (ref 70–99)

## 2013-01-28 LAB — CBC
HCT: 37.2 % — ABNORMAL LOW (ref 39.0–52.0)
MCHC: 36.3 g/dL — ABNORMAL HIGH (ref 30.0–36.0)
MCV: 92.3 fL (ref 78.0–100.0)
RDW: 12.2 % (ref 11.5–15.5)
WBC: 5.7 10*3/uL (ref 4.0–10.5)

## 2013-01-28 LAB — CK TOTAL AND CKMB (NOT AT ARMC): Relative Index: INVALID (ref 0.0–2.5)

## 2013-01-28 MED ORDER — SODIUM CHLORIDE 0.9 % IV SOLN
INTRAVENOUS | Status: DC
  Administered 2013-01-28: 75 mL via INTRAVENOUS
  Administered 2013-01-30: 75 mL/h via INTRAVENOUS
  Administered 2013-01-30: 75 mL via INTRAVENOUS

## 2013-01-28 MED ORDER — LORAZEPAM 2 MG/ML IJ SOLN
INTRAMUSCULAR | Status: AC
  Administered 2013-01-28: 1 mg via INTRAVENOUS
  Filled 2013-01-28: qty 1

## 2013-01-28 MED ORDER — PHENYTOIN SODIUM EXTENDED 100 MG PO CAPS: 300.0000 mg | ORAL_CAPSULE | Freq: Every day | ORAL | Status: DC

## 2013-01-28 MED ORDER — OXYCODONE HCL 5 MG PO TABS
10.0000 mg | ORAL_TABLET | Freq: Four times a day (QID) | ORAL | Status: DC | PRN
  Administered 2013-01-28 – 2013-01-30 (×4): 10 mg via ORAL
  Filled 2013-01-28 (×4): qty 2

## 2013-01-28 MED ORDER — SODIUM CHLORIDE 0.9 % IJ SOLN
3.0000 mL | Freq: Two times a day (BID) | INTRAMUSCULAR | Status: DC
  Administered 2013-01-28 – 2013-01-31 (×7): 3 mL via INTRAVENOUS

## 2013-01-28 MED ORDER — PHENYTOIN SODIUM EXTENDED 100 MG PO CAPS
300.0000 mg | ORAL_CAPSULE | Freq: Every day | ORAL | Status: DC
  Administered 2013-01-28 – 2013-01-29 (×2): 300 mg via ORAL
  Filled 2013-01-28 (×3): qty 3

## 2013-01-28 MED ORDER — LORAZEPAM 2 MG/ML IJ SOLN: 1.0000 mg | Freq: Once | INTRAMUSCULAR | Status: AC

## 2013-01-28 MED ORDER — MORPHINE SULFATE ER 30 MG PO TBCR
30.0000 mg | EXTENDED_RELEASE_TABLET | Freq: Two times a day (BID) | ORAL | Status: DC
  Administered 2013-01-28 – 2013-01-31 (×7): 30 mg via ORAL
  Filled 2013-01-28: qty 2
  Filled 2013-01-28 (×6): qty 1

## 2013-01-28 MED ORDER — IRBESARTAN 150 MG PO TABS
150.0000 mg | ORAL_TABLET | Freq: Every day | ORAL | Status: DC
  Filled 2013-01-28: qty 1

## 2013-01-28 MED ORDER — HEPARIN SODIUM (PORCINE) 5000 UNIT/ML IJ SOLN
5000.0000 [IU] | Freq: Three times a day (TID) | INTRAMUSCULAR | Status: DC
  Administered 2013-01-28 – 2013-01-31 (×9): 5000 [IU] via SUBCUTANEOUS
  Filled 2013-01-28 (×12): qty 1

## 2013-01-28 MED ORDER — VALSARTAN-HYDROCHLOROTHIAZIDE 160-25 MG PO TABS: 1.0000 | ORAL_TABLET | Freq: Every day | ORAL | Status: DC

## 2013-01-28 MED ORDER — MORPHINE SULFATE ER 15 MG PO TBCR: 75.0000 mg | EXTENDED_RELEASE_TABLET | Freq: Two times a day (BID) | ORAL | Status: DC

## 2013-01-28 MED ORDER — DIAZEPAM 5 MG/ML IJ SOLN
5.0000 mg | Freq: Once | INTRAMUSCULAR | Status: AC
  Administered 2013-01-28: 5 mg via INTRAVENOUS
  Filled 2013-01-28: qty 2

## 2013-01-28 MED ORDER — HYDROCHLOROTHIAZIDE 25 MG PO TABS
25.0000 mg | ORAL_TABLET | Freq: Every day | ORAL | Status: DC
  Filled 2013-01-28: qty 1

## 2013-01-28 MED ORDER — IOHEXOL 350 MG/ML SOLN
100.0000 mL | Freq: Once | INTRAVENOUS | Status: AC | PRN
  Administered 2013-01-28: 80 mL via INTRAVENOUS

## 2013-01-28 MED ORDER — SODIUM CHLORIDE 0.9 % IV SOLN
1000.0000 mg | Freq: Once | INTRAVENOUS | Status: DC
  Filled 2013-01-28: qty 10

## 2013-01-28 MED ORDER — TOPIRAMATE 100 MG PO TABS
200.0000 mg | ORAL_TABLET | Freq: Two times a day (BID) | ORAL | Status: DC
  Administered 2013-01-28 – 2013-01-31 (×8): 200 mg via ORAL
  Filled 2013-01-28 (×9): qty 2

## 2013-01-28 MED ORDER — ASPIRIN EC 81 MG PO TBEC
81.0000 mg | DELAYED_RELEASE_TABLET | Freq: Every day | ORAL | Status: DC
  Administered 2013-01-28 – 2013-01-31 (×4): 81 mg via ORAL
  Filled 2013-01-28 (×4): qty 1

## 2013-01-28 MED ORDER — SODIUM CHLORIDE 0.9 % IV SOLN
1500.0000 mg | Freq: Once | INTRAVENOUS | Status: AC
  Administered 2013-01-28: 1500 mg via INTRAVENOUS
  Filled 2013-01-28: qty 30

## 2013-01-28 MED ORDER — LORAZEPAM 2 MG/ML IJ SOLN
1.0000 mg | INTRAMUSCULAR | Status: DC | PRN
  Administered 2013-01-28 (×2): 1 mg via INTRAVENOUS
  Filled 2013-01-28 (×2): qty 1

## 2013-01-28 MED ORDER — OXYMORPHONE HCL ER 30 MG PO TB12: 30.0000 mg | ORAL_TABLET | Freq: Two times a day (BID) | ORAL | Status: DC

## 2013-01-28 MED ORDER — CARISOPRODOL 350 MG PO TABS: 350.0000 mg | ORAL_TABLET | Freq: Three times a day (TID) | ORAL | Status: DC | PRN

## 2013-01-28 MED ORDER — POTASSIUM CHLORIDE CRYS ER 20 MEQ PO TBCR
20.0000 meq | EXTENDED_RELEASE_TABLET | Freq: Every day | ORAL | Status: DC
  Administered 2013-01-28 – 2013-01-29 (×2): 20 meq via ORAL
  Filled 2013-01-28 (×3): qty 1

## 2013-01-28 MED ORDER — METOPROLOL TARTRATE 100 MG PO TABS
100.0000 mg | ORAL_TABLET | Freq: Every day | ORAL | Status: DC
  Administered 2013-01-28 – 2013-01-29 (×2): 100 mg via ORAL
  Filled 2013-01-28 (×3): qty 1

## 2013-01-28 NOTE — Progress Notes (Signed)
Notified Md about pt episodes of unresponsiveness.  Pt feels numbness in face then turns head to his right side before  goes unresponsive from 3-5 minutes.  Pt does respond to sternal rub, But has requested not to have these done. Informed pt that this needs to be done to check responsiveness.    Vs remain stable during episodes.  PERLA.  Family at bedside.  Will continue to monitor. Jeff [name]

## 2013-01-28 NOTE — Progress Notes (Signed)
Pt with c/o left arm tingling and had syncopal episode lasting ~45 seconds.  Pt. With head deviating to right side and pt. Unresponsive to sternal rub.  Pt. O2 sat 97% on 2L [place].  Pt. Began to make a gagging/choking noise and pt. Placed on left side.  Pt. became responsive and began crying.  Pt. States "My back hurts, you hurt my back."  VSS.  MD notified. Orders received. Will continue to monitor.

## 2013-01-28 NOTE — Progress Notes (Signed)
Pt. Noted to have seizure like episode.  Pt. Initially with c/o "crushing" CP radiating to left arm and jaw.  EKG obtained per standing orders.  Pt. With LOC x ~40minute with noting head and eyes deviating to the right and eyes remain fixed and not reactive to light.  Pt. immediately woke up and looked around returned to baseline but denies any recollection of episode.  VSS, MD at bedside. Will continue to monitor.

## 2013-01-28 NOTE — Progress Notes (Signed)
Portable EEG completed

## 2013-01-28 NOTE — Consult Note (Signed)
Neurology Consultation Reason for Consult: Concern for seizures Referring Physician: Lyda Perone  CC: Episodes of passing out  History is obtained from: Patient, wife, daughter  HPI: Jeff [name] is a 52 y.o. male with a history of hypertension who presents with recurrent episodes of left-sided pain followed by syncope with head and eye deviation. He has had at least 5 episodes today. He does not have a history of episodes similar to this. He describes the pain as occurring on the left side of his chest radiating into his arm. He is also noticed some numbness of his face mostly below his lip, but also in the right jaw.  With episode that he had in the emergency department, he had jerking of his arms as well as his legs. Following the episode, he recovers consciousness and is able to talk relatively quickly.  Of note, he does have a history of neuropathy.  Dr. Julian Reil witnessed one of these episodes during which he was able to palpate the pulse continuously and there is no change on telemetry.  ROS: A 14 point ROS was performed and is negative except as noted in the HPI.  Past Medical History  Diagnosis Date  . Hypertension   . Tuberculosis     POSITIVE  TB SKIN TEST 1992.6 MTH TX     Family History: Mother-began having seizures in the setting of advanced dementia  Social History: Tob: Denies  Exam: Current vital signs: BP 149/83  Pulse 60  Temp(Src) 98.6 F (37 C) (Oral)  Resp 8  SpO2 100% Vital signs in last 24 hours: Temp:  [98.6 F (37 C)] 98.6 F (37 C) (06/27 1858) Pulse Rate:  [53-60] 60 (06/28 0000) Resp:  [8-17] 8 (06/27 2315) BP: (102-168)/(46-83) 149/83 mmHg (06/28 0000) SpO2:  [98 %-100 %] 100 % (06/28 0000)  General: No apparent distress CV: Regular rate and rhythm Mental Status: Patient is awake, alert, oriented to person, place, month, year, and situation. Immediate and remote memory are intact. Patient is able to give a clear and coherent  history.  Cranial Nerves: II: Visual Fields are full. Pupils are equal, round, and reactive to light.  Discs are difficult to visualize. III,IV, VI: EOMI without ptosis or diploplia.  V: Facial sensation is symmetric to temperature VII: Facial movement is symmetric.  VIII: hearing is intact to voice X: Uvula elevates symmetrically XI: Shoulder shrug is symmetric. XII: tongue is midline without atrophy or fasciculations.  Motor: Tone is normal. Bulk is normal. 5/5 strength was present in all four extremities.  Sensory: Sensation is symmetric to light touch and temperature in the arms and legs. He has allodynia in bilateral legs below the hips Deep Tendon Reflexes: 2+ and symmetric in the biceps and patellae. Absent at ankle Plantars: Toes are downgoing bilaterally.  Cerebellar: FNF  are intact bilaterally Gait: Not tested due to patient safety concerns  I have reviewed labs in epic and the results pertinent to this consultation are: BMP mild hypocalcemia   Impression: 52 year old male with recurrent episodes of head deviation, loss of consciousness, left-sided chest pain. It is possible that this represents partial seizures, however is a very unusual presentation about it that is the case. Chest pain and syncope is also possible, but with his normal telemetry and troponins I would favor treating for seizures empirically at this time.   Recommendations: 1) fosphenytoin 15 mg per kilogram followed by Dilantin 300 each bedtime 2) continue topiramate 200 mg twice a day 3) CT head 4)  if CT head is negative, would consider CT angiogram head and neck 5) EEG in the morning   Ritta Slot, MD [facility] [phone]  If 7pm- 7am, please page neurology on call at [phone].

## 2013-01-28 NOTE — Progress Notes (Signed)
Pt. With second episode of seizure like activity.  RN called to room by family with pt. C/o arm "tingling and pain."  MD on floor and made aware.  Pt. With seizure like activity this time lasting approximately 2 minutes with head initially going to the left and then deviating to the right with the eyes deviating to the right and right and then "rolling to back of head." and remaining fixed and nonreactive to light.  Pt. Legs drawn up and pt. Did not respond to sternal chest rub or painful stimuli. Carotid pulse strong however pt noted to not be breathing x ~15-20 seconds during this episode.  O2 sats 96% RA BP 127/78 HR 65.  Pt. Eyes returned to midline.  Attempted to put O2 on patient and patient confused and swinging at RN.  Pt. Remained somnolent and unresponsive for additional ~1 minute then woke up and returned to baseline.  Orders received.  1mg  IV ativan given per order and pt. Given IV phosphenytoin per order.  Pt. Had a third episode of seizure like activity as phosphenytoin was infusing lasting 25 seconds.  With third episode pt. Become unresponsive but not deviation of eyes noted and patient immediately became responsive.  VSS.  Will continue to monitor patient.

## 2013-01-28 NOTE — Evaluation (Signed)
Clinical/Bedside Swallow Evaluation Patient Details  Name: Jeff [name] MRN: [id number] Date of Birth: 1960/08/23  Today's Date: 01/28/2013 Time: 1105-1140 SLP Time Calculation (min): 35 min  Past Medical History:  Past Medical History  Diagnosis Date  . Hypertension   . Tuberculosis     POSITIVE  TB SKIN TEST 1992.6 MTH TX   . Neuromuscular disorder     "back nerve stimulator"   Past Surgical History:  Past Surgical History  Procedure Laterality Date  . Cervical fusion    . Carpal tunnel release    . Knee arthroscopy    . Lumbar laminectomy/decompression microdiscectomy  06/22/2011    Procedure: LUMBAR LAMINECTOMY/DECOMPRESSION MICRODISCECTOMY;  Surgeon: Cristi Loron;  Location: [facility];  Service: Neurosurgery;  Laterality: N/A;  Thoracic Ten-Eleven,Thoracic Eleven-Twelve Laminectomy   HPI:  52 yo male adm with syncope and chest pain.  PMH + for ACDF, laminectomy/decompression lumbar 06/2011- has spine stimulator, + for TB test, HTN.  Pt is a former smoker.  Head CT negative 6/28.  Order for swallow evaluation received.  Pt with pulmonary congestion without edema per CXR. Pt reports he does not have h/o dysphagia but admits to voice being weaker now that normal.     Assessment / Plan / Recommendation Clinical Impression  pt presents with functional oropharyngeal swallow based on clinical swallow evaluation. No focal CN deficits that impact swallowing musculature.  No s/s of aspiration with po observed, voice remained clear and cough is strong indicative of adequate airway protection.  Educated pt and family to aspiration precautions and indications of aspiration or dysphagia.  SLP to sign off.      Aspiration Risk  Mild    Diet Recommendation Thin liquid;Regular   Liquid Administration via: Cup;Straw Medication Administration: Whole meds with liquid Supervision: Patient able to self feed Postural Changes and/or Swallow Maneuvers: Seated upright 90 degrees     Other  Recommendations Oral Care Recommendations: Oral care BID   Follow Up Recommendations  None    Frequency and Duration   n/a     Pertinent Vitals/Pain Afebrile, decreased    SLP Swallow Goals  n/a   Swallow Study Prior Functional Status   Eats regular diet at home.    General Date of Onset: 01/28/13 HPI: 52 yo male adm with syncope and chest pain.  PMH + for ACDF, laminectomy/decompression lumbar 06/2011- has spine stimulator, + for TB test, HTN.  Pt is a former smoker.  Head CT negative 6/28.  Order for swallow evaluation received.  Pt with pulmonary congestion without edema per CXR. Pt reports he does not have h/o dysphagia but admits to voice being weaker now that normal.   Type of Study: Bedside swallow evaluation Previous Swallow Assessment: none Diet Prior to this Study: Regular;Thin liquids Temperature Spikes Noted: No Respiratory Status: Room air History of Recent Intubation: No Behavior/Cognition: Alert;Cooperative;Pleasant mood Oral Cavity - Dentition: Adequate natural dentition Self-Feeding Abilities: Able to feed self Patient Positioning: Upright in bed Baseline Vocal Quality: Clear Volitional Cough: Strong Volitional Swallow: Able to elicit    Oral/Motor/Sensory Function Overall Oral Motor/Sensory Function: Appears within functional limits for tasks assessed   Ice Chips Ice chips: Not tested   Thin Liquid Thin Liquid: Within functional limits Presentation: Self Fed;Straw    Nectar Thick Nectar Thick Liquid: Not tested   Honey Thick Honey Thick Liquid: Not tested   Puree Puree: Within functional limits Presentation: Self Fed;Spoon   Solid   GO    Solid: Within functional  limits Presentation: Self Lisabeth Pick, MS [facility] SLP [phone]

## 2013-01-28 NOTE — Progress Notes (Signed)
Pt complains of feeling heaviness over all body and tingling in lips. VSS. Became unresponsive.  Calling Rapid Response and MD.

## 2013-01-28 NOTE — Progress Notes (Signed)
Patient seen and examined Admitted this morning with syncope, left-sided chest pain, head and eye deviation. Had 5 episodes today Following the episode, he recovers consciousness and is able to talk relatively quickly. Had one witnessed episode at 6: 50 this morning  Has received a loading dose of fosphenytoin Recheck  Dilantin level tomorrow Continue Topamax EEG pending CT head pending Will order a CT angiogram of the head and neck  Chest pain we'll order 2-D echo Cycle cardiac enzymes

## 2013-01-28 NOTE — Progress Notes (Signed)
Called at [phone] regarding status of patient with possible seizure activity through out the night.  Patient is stable now without signs of seizure but had period of unresponsiveness after c/o headache.  Patient had c/o tingling of lips prior to according to RN.  Unable to respond immediately to bedside - requested for RN to call MD.  On my arrival to unit - MD had seen patient and patient was in CT scan.  No acute intervention needed by RRT team. RN to call if needed.

## 2013-01-28 NOTE — Progress Notes (Signed)
Pt c/o lips tingling, no seizure activity noted, but pt did not respond to commands. VSS. Flushed face, hands cold and white. When arounsed c/o of massive HA, then went unresponsive again. Breathing shallow, sats maintained 99-100% on 2L[place]. 148/77. HR remained stable at 78 Paged Rapid Response and MD.

## 2013-01-28 NOTE — H&P (Signed)
[facility] History and Physical  Jeff [name] EAV:[id number] DOB: Feb 18, 1961 DOA: 01/27/2013  Referring physician: ED PCP: No PCP Per Patient  Chief Complaint: Syncope, chest pain  HPI: Jeff [name] is a 52 y.o. male who presents to the ED with 1 day history of intermittent episodes of chest pain and syncope.  The pain is sharp in quality with radiation to L shoulder and L arm, located on the L side of his chest, there is associated "numbness" in his lower jaw.  He presented initially to the fire department for his chest pain who gave him nitro and noted that he had syncope with the episodes.  In the ambulance he was noted to be bradycardic down into the mid 40s during the syncopal episodes (4 episodes per EMS)  In the ED he continued to have 2 intermittent episodes of chest pain followed by syncope but remained in NSR on the monitor during these episodes.  Per family there was one episode in the ED that he had visible convulsions with during syncope.  See physical exam for my personal description of the witnessed episodes.  Review of Systems: 12 systems reviewed and otherwise negative.  Past Medical History  Diagnosis Date  . Hypertension   . Tuberculosis     POSITIVE  TB SKIN TEST 1992.6 MTH TX    Past Surgical History  Procedure Laterality Date  . Cervical fusion    . Carpal tunnel release    . Knee arthroscopy    . Lumbar laminectomy/decompression microdiscectomy  06/22/2011    Procedure: LUMBAR LAMINECTOMY/DECOMPRESSION MICRODISCECTOMY;  Surgeon: Cristi Loron;  Location: [facility];  Service: Neurosurgery;  Laterality: N/A;  Thoracic Ten-Eleven,Thoracic Eleven-Twelve Laminectomy   Social History:  reports that he quit smoking about 17 years ago. His smoking use included Cigarettes. He has a 15 pack-year smoking history. He quit smokeless tobacco use about 17 years ago. He reports that  drinks alcohol. He reports that he does not use illicit drugs.   No Known  Allergies  No family history on file.  Prior to Admission medications   Medication Sig Start Date End Date Taking? Authorizing Provider  aspirin EC 81 MG tablet Take 81 mg by mouth daily.     Yes Historical Provider, MD  carisoprodol (SOMA) 350 MG tablet Take 350 mg by mouth 3 (three) times daily as needed for muscle spasms.   Yes Historical Provider, MD  metoprolol (LOPRESSOR) 100 MG tablet Take 100 mg by mouth daily.    Yes Historical Provider, MD  Oxycodone HCl 10 MG TABS Take 10 mg by mouth 4 (four) times daily as needed (for pain).   Yes Historical Provider, MD  oxymorphone (OPANA ER) 30 MG 12 hr tablet Take 30 mg by mouth every 12 (twelve) hours.   Yes Historical Provider, MD  potassium chloride SA (K-DUR,KLOR-CON) 20 MEQ tablet Take 20 mEq by mouth daily.     Yes Historical Provider, MD  topiramate (TOPAMAX) 100 MG tablet Take 200 mg by mouth 2 (two) times daily.   Yes Historical Provider, MD  valsartan-hydrochlorothiazide (DIOVAN-HCT) 160-25 MG per tablet Take 1 tablet by mouth daily.     Yes Historical Provider, MD   Physical Exam: Filed Vitals:   01/27/13 2247 01/27/13 2300 01/27/13 2315 01/28/13 0000  BP: 110/66 109/60 115/64 149/83  Pulse: 57 53 54 60  Temp:      TempSrc:      Resp: 12 13 8    SpO2: 100% 100% 100% 100%  General:  NAD, resting comfortably in bed Eyes: PEERLA EOMI ENT: mucous membranes moist Neck: supple w/o JVD Cardiovascular: RRR w/o MRG Respiratory: CTA B Abdomen: soft, nt, nd, bs+ Skin: no rash nor lesion Musculoskeletal: MAE, full ROM all 4 extremities Psychiatric: normal tone and affect Neurologic:  At baseline no focal deficit and AAOx3, After arrival to the floor I have witnessed 2 of the episodes, they begin with crushing chest pain, after which he looses consciousness, and his eyes and head deviate to the right side where his eyes remain fixed.  They then return to midline as the episode ends and he wakes up.  He has no recollection of the  episode, no loss of urine.  I personally palpated a radial pulse during both episodes and monitored the patients heart rate.  He had a strong radial pulse, strong carotid pulse, and maintained NSR at a rate of 60-65 during the episodes witnessed.  Labs on Admission:  Basic Metabolic Panel:  Recent Labs Lab 01/27/13 1917  NA 137  K 4.3  CL 105  CO2 24  GLUCOSE 91  BUN 24*  CREATININE 1.28  CALCIUM 8.3*   Liver Function Tests: No results found for this basename: AST, ALT, ALKPHOS, BILITOT, PROT, ALBUMIN,  in the last 168 hours No results found for this basename: LIPASE, AMYLASE,  in the last 168 hours No results found for this basename: AMMONIA,  in the last 168 hours CBC:  Recent Labs Lab 01/27/13 1917  WBC 8.4  NEUTROABS 4.3  HGB 13.8  HCT 38.2*  MCV 92.0  PLT 205   Cardiac Enzymes: No results found for this basename: CKTOTAL, CKMB, CKMBINDEX, TROPONINI,  in the last 168 hours  BNP (last 3 results) No results found for this basename: PROBNP,  in the last 8760 hours CBG: No results found for this basename: GLUCAP,  in the last 168 hours  Radiological Exams on Admission: Dg Chest Portable 1 View  01/27/2013   *RADIOLOGY REPORT*  Clinical Data: Chest pain, shortness of breath, weakness, bradycardia  PORTABLE CHEST - 1 VIEW  Comparison: 07/11/2011; 07/09/2011  Findings: Grossly unchanged enlarged cardiac silhouette and mediastinal contours with apparent differences kilovolt to decreased lung volumes and AP projection.  The transcutaneous pacer past overlying the left heart border.  Mild pulmonary venous congestion without frank evidence of edema.  Minimal bibasilar opacities, left greater than right.  No focal airspace opacity.  No definite pleural effusion.  No pneumothorax.  Unchanged bones including lower cervical ACDF.  IMPRESSION: Pulmonary congestion without definite evidence of edema on this low lung volumes AP portable exam.   Original Report Authenticated By: Tacey Ruiz, MD    EKG: Independently reviewed.  NSR  Assessment/Plan Active Problems:   Chest pain   Seizures   Syncope   1. 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  Appreciate evaluation by Dr. Amada Jupiter who agrees that at this time we should empirically start anti seizure meds.  Alternatively this could represent a vasovagal response and certainly in the ambulance when he was reportedly dropping his heart rate this was suspicious, however he has not been noted to drop his heart rate during any of the episodes that I have witnessed and has maintained strong radial pulses (therefore I estimate his BP has to be at least 90 during episodes). 2. Chest pain - the chest pain is episodic, lasts only a min or two, and appears to proceed the syncopal  episodes.  As a result I am suspicious that this chest pain may be more of a seizure aura than a cardiogenic chest pain despite its very classic distrubution for coronary chest pain.  None the less we are checking troponins, have the patient on tele monitor, and likely will wish to do a cardiac work up as well.  Patient NPO except ice chips at this time.    Code Status: Full Code (must indicate code status--if unknown or must be presumed, indicate so) Family Communication: Family in room during evaluation (indicate person spoken with, if applicable, with phone number if by telephone) Disposition Plan: Admit to inpatient (indicate anticipated LOS)  Time spent: 70 min  GARDNER, JARED M. [facility] Pager [phone]  If 7PM-7AM, please contact night-coverage [email or web address] Password [email or web address] 01/28/2013, 1:24 AM

## 2013-01-28 NOTE — Procedures (Signed)
ELECTROENCEPHALOGRAM REPORT   Patient: Jeff [name]       Room #: 4V-40 EEG No. ID: [id number] Age: 52 y.o.        Sex: male Referring Physician: Dr.Gardner Report Date:  01/28/2013        Interpreting Physician: Aline Brochure  History: Jeff [name] is an 52 y.o. male and presenting with recurrent spells of numbness involving perioral region and left upper extremity, chest pain followed by unresponsiveness.  Indications for study:   Rule out new onset seizure disorder  Technique: This is an 18 channel routine scalp EEG performed at the bedside with bipolar and monopolar montages arranged in accordance to the international 10/20 system of electrode placement.   Description: EEG recording was performed during wakefulness and during sleep. Prominent background activity during wakefulness consisted of 9-10 Hz symmetrical alpha rhythm which attenuates well with eye opening, along with diffuse low amplitude beta activity which is most prominent in the central and frontal regions. Photic stimulation was not performed. Hyperventilation was not performed. No epileptiform discharges recorded. During stage II of sleep symmetrical sleep spindles and K complexes were recorded.   Interpretation:  this is a normal EEG recording during wakefulness and during sleep.   Venetia Maxon M.D. [facility] [phone]  [phone]

## 2013-01-28 NOTE — Progress Notes (Signed)
  Echocardiogram 2D Echocardiogram has been performed.  Jeff [name] 01/28/2013, 5:08 PM

## 2013-01-29 ENCOUNTER — Inpatient Hospital Stay (HOSPITAL_COMMUNITY): Payer: 59

## 2013-01-29 DIAGNOSIS — R569 Unspecified convulsions: Secondary | ICD-10-CM

## 2013-01-29 DIAGNOSIS — R079 Chest pain, unspecified: Secondary | ICD-10-CM

## 2013-01-29 LAB — MRSA PCR SCREENING: MRSA by PCR: NEGATIVE

## 2013-01-29 LAB — COMPREHENSIVE METABOLIC PANEL
Albumin: 3 g/dL — ABNORMAL LOW (ref 3.5–5.2)
BUN: 12 mg/dL (ref 6–23)
Calcium: 7.9 mg/dL — ABNORMAL LOW (ref 8.4–10.5)
Creatinine, Ser: 0.98 mg/dL (ref 0.50–1.35)
GFR calc Af Amer: >90 mL/min (ref >90)
Glucose, Bld: 123 mg/dL — ABNORMAL HIGH (ref 70–99)
Total Protein: 5.9 g/dL — ABNORMAL LOW (ref 6.0–8.3)

## 2013-01-29 LAB — PHENYTOIN LEVEL, TOTAL: Phenytoin Lvl: 12.8 ug/mL (ref 10.0–20.0)

## 2013-01-29 MED ORDER — LORAZEPAM 2 MG/ML IJ SOLN
2.0000 mg | Freq: Once | INTRAMUSCULAR | Status: AC
  Administered 2013-01-29: 2 mg via INTRAVENOUS
  Filled 2013-01-29: qty 1

## 2013-01-29 MED ORDER — POTASSIUM CHLORIDE CRYS ER 20 MEQ PO TBCR
20.0000 meq | EXTENDED_RELEASE_TABLET | Freq: Two times a day (BID) | ORAL | Status: AC
  Administered 2013-01-29 (×2): 20 meq via ORAL
  Filled 2013-01-29 (×2): qty 1

## 2013-01-29 MED ORDER — IOHEXOL 350 MG/ML SOLN
80.0000 mL | Freq: Once | INTRAVENOUS | Status: AC | PRN
  Administered 2013-01-29: 80 mL via INTRAVENOUS

## 2013-01-29 NOTE — Progress Notes (Signed)
Subjective: Patient continues to have spells consisting of tingling around his mouth then deviation of eyes with head turning, followed by unresponsiveness to verbal stimulation. Patient reportedly has been responding to sternal rub and has specifically asked about not be done during the spells. Dilantin level was 12.9.   Objective: Current vital signs: BP 108/65  Pulse 63  Temp(Src) 98.4 F (36.9 C) (Oral)  Resp 14  SpO2 100%  Neurologic Exam: Alert and in no acute distress. Currently is well-oriented to time as well as place. Speech was normal. No focal weakness was noted.  EEG on 01/28/2013 was normal with no indications of seizure disorder.  Lab Results: Results for orders placed during the hospital encounter of 01/27/13 (from the past 48 hour(s))  CBC WITH DIFFERENTIAL     Status: Abnormal   Collection Time    01/27/13  7:17 PM      Result Value Range   WBC 8.4  4.0 - 10.5 K/uL   RBC 4.15 (*) 4.22 - 5.81 MIL/uL   Hemoglobin 13.8  13.0 - 17.0 g/dL   HCT 96.2 (*) 95.2 - 84.1 %   MCV 92.0  78.0 - 100.0 fL   MCH 33.3  26.0 - 34.0 pg   MCHC 36.1 (*) 30.0 - 36.0 g/dL   RDW 32.4  40.1 - 02.7 %   Platelets 205  150 - 400 K/uL   Neutrophils Relative % 51  43 - 77 %   Neutro Abs 4.3  1.7 - 7.7 K/uL   Lymphocytes Relative 38  12 - 46 %   Lymphs Abs 3.1  0.7 - 4.0 K/uL   Monocytes Relative 9  3 - 12 %   Monocytes Absolute 0.8  0.1 - 1.0 K/uL   Eosinophils Relative 2  0 - 5 %   Eosinophils Absolute 0.2  0.0 - 0.7 K/uL   Basophils Relative 0  0 - 1 %   Basophils Absolute 0.0  0.0 - 0.1 K/uL  BASIC METABOLIC PANEL     Status: Abnormal   Collection Time    01/27/13  7:17 PM      Result Value Range   Sodium 137  135 - 145 mEq/L   Potassium 4.3  3.5 - 5.1 mEq/L   Comment: HEMOLYSIS AT THIS LEVEL MAY AFFECT RESULT   Chloride 105  96 - 112 mEq/L   CO2 24  19 - 32 mEq/L   Glucose, Bld 91  70 - 99 mg/dL   BUN 24 (*) 6 - 23 mg/dL   Creatinine, Ser 2.53  0.50 - 1.35 mg/dL    Calcium 8.3 (*) 8.4 - 10.5 mg/dL   GFR calc non Af Amer 62 (*) >90 mL/min   GFR calc Af Amer 72 (*) >90 mL/min   Comment:            The eGFR has been calculated     using the CKD EPI equation.     This calculation has not been     validated in all clinical     situations.     eGFR's persistently     <90 mL/min signify     possible Chronic Kidney Disease.  POCT I-STAT TROPONIN I     Status: None   Collection Time    01/27/13  8:07 PM      Result Value Range   Troponin i, poc 0.00  0.00 - 0.08 ng/mL   Comment 3  Comment: Due to the release kinetics of cTnI,     a negative result within the first hours     of the onset of symptoms does not rule out     myocardial infarction with certainty.     If myocardial infarction is still suspected,     repeat the test at appropriate intervals.  TROPONIN I     Status: None   Collection Time    01/28/13  1:15 AM      Result Value Range   Troponin I <0.30  <0.30 ng/mL   Comment:            Due to the release kinetics of cTnI,     a negative result within the first hours     of the onset of symptoms does not rule out     myocardial infarction with certainty.     If myocardial infarction is still suspected,     repeat the test at appropriate intervals.  ALBUMIN     Status: None   Collection Time    01/28/13  1:15 AM      Result Value Range   Albumin 3.6  3.5 - 5.2 g/dL  TROPONIN I     Status: None   Collection Time    01/28/13  7:55 AM      Result Value Range   Troponin I <0.30  <0.30 ng/mL   Comment:            Due to the release kinetics of cTnI,     a negative result within the first hours     of the onset of symptoms does not rule out     myocardial infarction with certainty.     If myocardial infarction is still suspected,     repeat the test at appropriate intervals.  CBC     Status: Abnormal   Collection Time    01/28/13  7:55 AM      Result Value Range   WBC 5.7  4.0 - 10.5 K/uL   RBC 4.03 (*) 4.22 - 5.81  MIL/uL   Hemoglobin 13.5  13.0 - 17.0 g/dL   HCT 16.1 (*) 09.6 - 04.5 %   MCV 92.3  78.0 - 100.0 fL   MCH 33.5  26.0 - 34.0 pg   MCHC 36.3 (*) 30.0 - 36.0 g/dL   RDW 40.9  81.1 - 91.4 %   Platelets 160  150 - 400 K/uL  BASIC METABOLIC PANEL     Status: Abnormal   Collection Time    01/28/13  7:55 AM      Result Value Range   Sodium 141  135 - 145 mEq/L   Potassium 3.4 (*) 3.5 - 5.1 mEq/L   Chloride 108  96 - 112 mEq/L   CO2 24  19 - 32 mEq/L   Glucose, Bld 119 (*) 70 - 99 mg/dL   BUN 18  6 - 23 mg/dL   Creatinine, Ser 7.82  0.50 - 1.35 mg/dL   Calcium 8.2 (*) 8.4 - 10.5 mg/dL   GFR calc non Af Amer 77 (*) >90 mL/min   GFR calc Af Amer 89 (*) >90 mL/min   Comment:            The eGFR has been calculated     using the CKD EPI equation.     This calculation has not been     validated in all clinical     situations.  eGFR's persistently     <90 mL/min signify     possible Chronic Kidney Disease.  PHENYTOIN LEVEL, TOTAL     Status: None   Collection Time    01/28/13  7:55 AM      Result Value Range   Phenytoin Lvl 15.0  10.0 - 20.0 ug/mL  CK TOTAL AND CKMB     Status: None   Collection Time    01/28/13  7:55 AM      Result Value Range   Total CK 46  7 - 232 U/L   CK, MB 1.4  0.3 - 4.0 ng/mL   Relative Index RELATIVE INDEX IS INVALID  0.0 - 2.5   Comment: WHEN CK < 100 U/L             TROPONIN I     Status: None   Collection Time    01/28/13  1:00 PM      Result Value Range   Troponin I <0.30  <0.30 ng/mL   Comment:            Due to the release kinetics of cTnI,     a negative result within the first hours     of the onset of symptoms does not rule out     myocardial infarction with certainty.     If myocardial infarction is still suspected,     repeat the test at appropriate intervals.  MRSA PCR SCREENING     Status: None   Collection Time    01/28/13 10:40 PM      Result Value Range   MRSA by PCR NEGATIVE  NEGATIVE   Comment:            The GeneXpert  MRSA Assay (FDA     approved for NASAL specimens     only), is one component of a     comprehensive MRSA colonization     surveillance program. It is not     intended to diagnose MRSA     infection nor to guide or     monitor treatment for     MRSA infections.  COMPREHENSIVE METABOLIC PANEL     Status: Abnormal   Collection Time    01/29/13  5:40 AM      Result Value Range   Sodium 141  135 - 145 mEq/L   Potassium 3.2 (*) 3.5 - 5.1 mEq/L   Chloride 112  96 - 112 mEq/L   CO2 22  19 - 32 mEq/L   Glucose, Bld 123 (*) 70 - 99 mg/dL   BUN 12  6 - 23 mg/dL   Creatinine, Ser 4.09  0.50 - 1.35 mg/dL   Calcium 7.9 (*) 8.4 - 10.5 mg/dL   Total Protein 5.9 (*) 6.0 - 8.3 g/dL   Albumin 3.0 (*) 3.5 - 5.2 g/dL   AST 10  0 - 37 U/L   ALT 10  0 - 53 U/L   Alkaline Phosphatase 69  39 - 117 U/L   Total Bilirubin 0.2 (*) 0.3 - 1.2 mg/dL   GFR calc non Af Amer >90  >90 mL/min   GFR calc Af Amer >90  >90 mL/min   Comment:            The eGFR has been calculated     using the CKD EPI equation.     This calculation has not been     validated in all clinical     situations.  eGFR's persistently     <90 mL/min signify     possible Chronic Kidney Disease.  PHENYTOIN LEVEL, TOTAL     Status: None   Collection Time    01/29/13  5:40 AM      Result Value Range   Phenytoin Lvl 12.8  10.0 - 20.0 ug/mL    Studies/Results: Ct Angio Head W/cm &/or Wo Cm  01/28/2013   *RADIOLOGY REPORT*  Clinical Data:  Episode of syncope.  Head and deviation.  Loss of consciousness.  Mental status changes.  CT ANGIOGRAPHY HEAD AND NECK  Technique:  Multidetector CT imaging of the head and neck was performed using the standard protocol during bolus administration of intravenous contrast.  Multiplanar CT image reconstructions including MIPs were obtained to evaluate the vascular anatomy. Carotid stenosis measurements (when applicable) are obtained utilizing NASCET criteria, using the distal internal carotid diameter as  the denominator.  Contrast: 80mL OMNIPAQUE IOHEXOL 350 MG/ML SOLN  Comparison:  Head CT same day  CTA NECK  Findings:  Lung apices are clear.  No superior mediastinal pathology.  The branching pattern of the brachiocephalic vessels from the arch is normal.  The right common carotid artery is widely patent to the bifurcation.  There is mild atherosclerotic disease at the carotid bifurcation region.  There is no stenosis of the proximal internal carotid artery when compared to the more distal cervical ICA, but there does appear to be a small ulceration.  There are surgical clips adjacent to the midportion of the left common carotid artery.  The vessel does remain widely patent through that region.  The carotid bifurcation on the left appears normal without atherosclerotic disease, stenosis or irregularity. The left cervical internal carotid arteries widely patent.  There is mild calcific atherosclerosis of the proximal right vertebral artery which is the dominant vessel.  No stenosis is suspected however.  The left vertebral artery origin is widely patent.  Both vertebral arteries are patent through the cervical region.  No subclavian stenosis on either side.  No soft tissue lesion of the neck.  Ordinary cervical spondylosis.   Review of the MIP images confirms the above findings.  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  There does appear to be a tiny ulceration posteriorly within the proximal ICA, not more than 1 mm in size.  Surgical clips adjacent to the common carotid artery on the left, probably related to cervical spine surgery.  No carotid bifurcation disease on the left.  CTA HEAD  Findings:  Both cervical internal carotid arteries are widely patent into the brain.  No siphon stenosis.  There is minimal atherosclerotic calcification in the supraclinoid ICA region on the right.  The anterior middle cerebral vessels are patent without  proximal stenosis, aneurysm or vascular malformation.  Both vertebral arteries are patent to the basilar.  No basilar stenosis.  Posterior circulation branch vessels appear normal.  Venous structures appear normal.  The brain itself continues to have a normal appearance.   Review of the MIP images confirms the above findings.  IMPRESSION: Normal intracranial CT angiography with the exception of mild calcification of the supraclinoid internal carotid artery on the right without stenosis.   Original Report Authenticated By: Paulina Fusi, M.D.   Ct Head Wo Contrast  01/28/2013   *RADIOLOGY REPORT*  Clinical Data: Seizure  CT HEAD WITHOUT CONTRAST  Technique:  Contiguous axial images were obtained from the base of the skull through the  vertex without contrast.  Comparison: None.  Findings: No acute intracranial abnormality is present. Specifically, there is no evidence for acute infarct, hemorrhage, mass, hydrocephalus, or extra-axial fluid collection.  The paranasal sinuses and mastoid air cells are clear.  The globes and orbits are intact.  The osseous skull is intact.  IMPRESSION: Negative CT of the head.   Original Report Authenticated By: Marin Roberts, M.D.   Ct Angio Neck W/cm &/or Wo/cm  01/28/2013   *RADIOLOGY REPORT*  Clinical Data:  Episode of syncope.  Head and deviation.  Loss of consciousness.  Mental status changes.  CT ANGIOGRAPHY HEAD AND NECK  Technique:  Multidetector CT imaging of the head and neck was performed using the standard protocol during bolus administration of intravenous contrast.  Multiplanar CT image reconstructions including MIPs were obtained to evaluate the vascular anatomy. Carotid stenosis measurements (when applicable) are obtained utilizing NASCET criteria, using the distal internal carotid diameter as the denominator.  Contrast: 80mL OMNIPAQUE IOHEXOL 350 MG/ML SOLN  Comparison:  Head CT same day  CTA NECK  Findings:  Lung apices are clear.  No superior mediastinal  pathology.  The branching pattern of the brachiocephalic vessels from the arch is normal.  The right common carotid artery is widely patent to the bifurcation.  There is mild atherosclerotic disease at the carotid bifurcation region.  There is no stenosis of the proximal internal carotid artery when compared to the more distal cervical ICA, but there does appear to be a small ulceration.  There are surgical clips adjacent to the midportion of the left common carotid artery.  The vessel does remain widely patent through that region.  The carotid bifurcation on the left appears normal without atherosclerotic disease, stenosis or irregularity. The left cervical internal carotid arteries widely patent.  There is mild calcific atherosclerosis of the proximal right vertebral artery which is the dominant vessel.  No stenosis is suspected however.  The left vertebral artery origin is widely patent.  Both vertebral arteries are patent through the cervical region.  No subclavian stenosis on either side.  No soft tissue lesion of the neck.  Ordinary cervical spondylosis.   Review of the MIP images confirms the above findings.  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  There does appear to be a tiny ulceration posteriorly within the proximal ICA, not more than 1 mm in size.  Surgical clips adjacent to the common carotid artery on the left, probably related to cervical spine surgery.  No carotid bifurcation disease on the left.  CTA HEAD  Findings:  Both cervical internal carotid arteries are widely patent into the brain.  No siphon stenosis.  There is minimal atherosclerotic calcification in the supraclinoid ICA region on the right.  The anterior middle cerebral vessels are patent without proximal stenosis, aneurysm or vascular malformation.  Both vertebral arteries are patent to the basilar.  No basilar stenosis.  Posterior circulation branch vessels  appear normal.  Venous structures appear normal.  The brain itself continues to have a normal appearance.   Review of the MIP images confirms the above findings.  IMPRESSION: Normal intracranial CT angiography with the exception of mild calcification of the supraclinoid internal carotid artery on the right without stenosis.   Original Report Authenticated By: Paulina Fusi, M.D.   Ct Angio Chest Pe W/cm &/or Wo Cm  01/29/2013   *RADIOLOGY REPORT*  Clinical Data: Elevated D-dimer.  Chest pain.  CT ANGIOGRAPHY CHEST  Technique:  Multidetector CT imaging of the chest using the standard protocol during bolus administration of intravenous contrast. Multiplanar reconstructed images including MIPs were obtained and reviewed to evaluate the vascular anatomy.  Contrast: 80mL OMNIPAQUE IOHEXOL 350 MG/ML SOLN  Comparison: None.  Findings: There are no filling defects in the pulmonary arterial tree to suggest acute pulmonary thromboembolism.  No abnormal mediastinal adenopathy.  Prominent mediastinal fat is present.  Minimal coronary artery calcification in the proximal left anterior descending.  No pneumothorax.  No pleural effusion.  Dependent atelectasis at the lung bases.  A spinal cord stimulator is in the posterior epidural space at the T7 and T8 level.  Lower thoracic laminotomy decompression has been performed.  IMPRESSION: No evidence of acute pulmonary thromboembolism.  Postoperative changes.   Original Report Authenticated By: Jolaine Click, M.D.   Dg Chest Portable 1 View  01/27/2013   *RADIOLOGY REPORT*  Clinical Data: Chest pain, shortness of breath, weakness, bradycardia  PORTABLE CHEST - 1 VIEW  Comparison: 07/11/2011; 07/09/2011  Findings: Grossly unchanged enlarged cardiac silhouette and mediastinal contours with apparent differences kilovolt to decreased lung volumes and AP projection.  The transcutaneous pacer past overlying the left heart border.  Mild pulmonary venous congestion without frank evidence of  edema.  Minimal bibasilar opacities, left greater than right.  No focal airspace opacity.  No definite pleural effusion.  No pneumothorax.  Unchanged bones including lower cervical ACDF.  IMPRESSION: Pulmonary congestion without definite evidence of edema on this low lung volumes AP portable exam.   Original Report Authenticated By: Tacey Ruiz, MD    Medications:  I have reviewed the patient's current medications. Scheduled: . aspirin EC  81 mg Oral Daily  . heparin  5,000 Units Subcutaneous Q8H  . metoprolol  100 mg Oral Daily  . morphine  30 mg Oral Q12H  . phenytoin  300 mg Oral QHS  . potassium chloride  20 mEq Oral BID  . sodium chloride  3 mL Intravenous Q12H  . topiramate  200 mg Oral BID   Continuous: . sodium chloride 75 mL (01/28/13 2006)   ZOX:[id number], LORazepam, oxyCODONE  Assessment/Plan: Recurrent spells of unclear etiology with suggestion of possible focal seizure activity. Workup has been negative for CNS lesion and routine EEG was normal. I am suspicious that these spells may be psychophysiologic in etiology. I will order continuous EEG/video monitoring, during which hopefully we will be able to capture at least one of his spells and correlate to clinical activity with cerebral electrical activity to rule out seizure disorder. For now we'll continue Dilantin at current dose.  C.R. Roseanne Reno, MD [facility] [phone]  01/29/2013  12:46 PM

## 2013-01-29 NOTE — Progress Notes (Signed)
Called into patient's room for another "episode". Per the RN that went into the room, pt had eyes closed and was unresponsive to calling his name but did respond to sternal rub, woke up and was oriented.

## 2013-01-29 NOTE — ED Provider Notes (Signed)
I saw and evaluated the patient, reviewed the resident's note and I agree with the findings and plan.  I reviewed and agree with ECG interpretation by Dr. Fayrene Fearing.  Pt with squeezing severe CP, episodic associated with mild sinus bradycardia and brief syncopal episodes, no tonic clonic activity.  Witnessed once in the ED, but no sig bradycardia seen in the ED.  Several witnessed by EMS en route. Troponin neg, Pt did eventually get relief of CP.   Had some brief, transient episodes of facial tingling with no associated weakness or other signs of CVA.  Doubt these brief syncopal events were related to CVA . Will need tele monitoring, possible cardiology consultation.    Gavin Pound. Oletta Lamas, MD 01/29/13 1610

## 2013-01-29 NOTE — Progress Notes (Signed)
[facility] PROGRESS NOTE  Jeff [name] WJX:[id number] DOB: [birth year] DOA: 01/27/2013 PCP: No PCP Per Patient  Assessment/Plan: 1. Seizure Activity; Continue Seizure protocol (cont. Manage per Neurology); per Sister Neurology will start continuous EEG (note not available) 2. PE; Pt [name] score is Low however since his D-Dimer  Is positive will require CT PE protocol 3. Chest Pain;  Resolved 4. Hypokalemia; mildly low; replace.    Code Status:Full  Family Communication: Spoke w/ Sister and Wife and advised them of plan Disposition Plan: Per Neurology   Consultants: Neurology (Dr Noel Christmas) Procedures:  none  Antibiotics:  none  HPI/Subjective: Jeff [name] 52 y.o. WM PMHx Chronic Back pain 2dary to T11-T12 Diskectomy 2012; Per sister seen in [facility]. Internal Spinal Stim Present.Per sister (-) Hx Seizure activity. (-) FHx seizure activity Per Admission not; presented to the ED with 1 day history of intermittent episodes of chest pain and syncope. The pain is sharp in quality with radiation to L shoulder and L arm, located on the L side of his chest, there is associated "numbness" in his lower jaw. He presented initially to the fire department for his chest pain who gave him nitro and noted that he had syncope with the episodes. In the ambulance he was noted to be bradycardic down into the mid 40s during the syncopal episodes (4 episodes per EMS). TODAY Sister states @ approx 0530 Pt (+) trembling lower lip, (+) Pain Rt jaw/side of face and b/c unresponsive x 5 min (similar to previous seizure activity); Tx w/ Ativan 2mg  @ 0548. NOTE: Pt was started on fosphenytoin 15 mg per kilogram followed by Dilantin 300 each bedtime topiramate 200 mg twice a day per Neurology.   2D Cardiac Echo 28 June; Mild LVH/LAH, LVEF= 55-60% LE Doppler 27 June; No evidence of deep vein or superficial thrombosis involving the right lower extremity and left lower  extremity          Objective: Filed Vitals:   01/29/13 0530 01/29/13 0800 01/29/13 0817 01/29/13 0916  BP: 109/70 108/65  108/65  Pulse: 60  57 63  Temp:   98.4 F (36.9 C)   TempSrc:   Oral   Resp: 12  14   SpO2: 100% 100%      Intake/Output Summary (Last 24 hours) at 01/29/13 1113 Last data filed at 01/29/13 0916  Gross per 24 hour  Intake 1135.5 ml  Output   1075 ml  Net   60.5 ml   There were no vitals filed for this visit.  Exam:   General:  Lethargic but arousalable  (2dary to Ativan)  Cardiovascular:RRR, (-) M/R/G, DP/PT pulse +2  Respiratory: CTA Bilat  Abdomen: Soft , NT, ND, (+) BS   Data Reviewed: Basic Metabolic Panel:  Recent Labs Lab 01/27/13 1917 01/28/13 0755 01/29/13 0540  NA 137 141 141  K 4.3 3.4* 3.2*  CL 105 108 112  CO2 24 24 22   GLUCOSE 91 119* 123*  BUN 24* 18 12  CREATININE 1.28 1.07 0.98  CALCIUM 8.3* 8.2* 7.9*   Liver Function Tests:  Recent Labs Lab 01/28/13 0115 01/29/13 0540  AST  --  10  ALT  --  10  ALKPHOS  --  69  BILITOT  --  0.2*  PROT  --  5.9*  ALBUMIN 3.6 3.0*   No results found for this basename: LIPASE, AMYLASE,  in the last 168 hours No results found for this basename: AMMONIA,  in the  last 168 hours CBC:  Recent Labs Lab 01/27/13 1917 01/28/13 0755  WBC 8.4 5.7  NEUTROABS 4.3  --   HGB 13.8 13.5  HCT 38.2* 37.2*  MCV 92.0 92.3  PLT 205 160   Cardiac Enzymes:  Recent Labs Lab 01/28/13 0115 01/28/13 0755 01/28/13 1300  CKTOTAL  --  46  --   CKMB  --  1.4  --   TROPONINI <0.30 <0.30 <0.30   BNP (last 3 results) No results found for this basename: PROBNP,  in the last 8760 hours CBG: No results found for this basename: GLUCAP,  in the last 168 hours  Recent Results (from the past 240 hour(s))  MRSA PCR SCREENING     Status: None   Collection Time    01/28/13 10:40 PM      Result Value Range Status   MRSA by PCR NEGATIVE  NEGATIVE Final   Comment:            The GeneXpert  MRSA Assay (FDA     approved for NASAL specimens     only), is one component of a     comprehensive MRSA colonization     surveillance program. It is not     intended to diagnose MRSA     infection nor to guide or     monitor treatment for     MRSA infections.     Studies: Ct Angio Head W/cm &/or Wo Cm  01/28/2013   *RADIOLOGY REPORT*  Clinical Data:  Episode of syncope.  Head and deviation.  Loss of consciousness.  Mental status changes.  CT ANGIOGRAPHY HEAD AND NECK  Technique:  Multidetector CT imaging of the head and neck was performed using the standard protocol during bolus administration of intravenous contrast.  Multiplanar CT image reconstructions including MIPs were obtained to evaluate the vascular anatomy. Carotid stenosis measurements (when applicable) are obtained utilizing NASCET criteria, using the distal internal carotid diameter as the denominator.  Contrast: 80mL OMNIPAQUE IOHEXOL 350 MG/ML SOLN  Comparison:  Head CT same day  CTA NECK  Findings:  Lung apices are clear.  No superior mediastinal pathology.  The branching pattern of the brachiocephalic vessels from the arch is normal.  The right common carotid artery is widely patent to the bifurcation.  There is mild atherosclerotic disease at the carotid bifurcation region.  There is no stenosis of the proximal internal carotid artery when compared to the more distal cervical ICA, but there does appear to be a small ulceration.  There are surgical clips adjacent to the midportion of the left common carotid artery.  The vessel does remain widely patent through that region.  The carotid bifurcation on the left appears normal without atherosclerotic disease, stenosis or irregularity. The left cervical internal carotid arteries widely patent.  There is mild calcific atherosclerosis of the proximal right vertebral artery which is the dominant vessel.  No stenosis is suspected however.  The left vertebral artery origin is widely patent.   Both vertebral arteries are patent through the cervical region.  No subclavian stenosis on either side.  No soft tissue lesion of the neck.  Ordinary cervical spondylosis.   Review of the MIP images confirms the above findings.  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  There does appear to be a tiny ulceration posteriorly within the proximal ICA, not more than 1 mm in size.  Surgical clips adjacent to the common carotid artery  on the left, probably related to cervical spine surgery.  No carotid bifurcation disease on the left.  CTA HEAD  Findings:  Both cervical internal carotid arteries are widely patent into the brain.  No siphon stenosis.  There is minimal atherosclerotic calcification in the supraclinoid ICA region on the right.  The anterior middle cerebral vessels are patent without proximal stenosis, aneurysm or vascular malformation.  Both vertebral arteries are patent to the basilar.  No basilar stenosis.  Posterior circulation branch vessels appear normal.  Venous structures appear normal.  The brain itself continues to have a normal appearance.   Review of the MIP images confirms the above findings.  IMPRESSION: Normal intracranial CT angiography with the exception of mild calcification of the supraclinoid internal carotid artery on the right without stenosis.   Original Report Authenticated By: Paulina Fusi, M.D.   Ct Head Wo Contrast  01/28/2013   *RADIOLOGY REPORT*  Clinical Data: Seizure  CT HEAD WITHOUT CONTRAST  Technique:  Contiguous axial images were obtained from the base of the skull through the vertex without contrast.  Comparison: None.  Findings: No acute intracranial abnormality is present. Specifically, there is no evidence for acute infarct, hemorrhage, mass, hydrocephalus, or extra-axial fluid collection.  The paranasal sinuses and mastoid air cells are clear.  The globes and orbits are intact.  The osseous  skull is intact.  IMPRESSION: Negative CT of the head.   Original Report Authenticated By: Marin Roberts, M.D.   Ct Angio Neck W/cm &/or Wo/cm  01/28/2013   *RADIOLOGY REPORT*  Clinical Data:  Episode of syncope.  Head and deviation.  Loss of consciousness.  Mental status changes.  CT ANGIOGRAPHY HEAD AND NECK  Technique:  Multidetector CT imaging of the head and neck was performed using the standard protocol during bolus administration of intravenous contrast.  Multiplanar CT image reconstructions including MIPs were obtained to evaluate the vascular anatomy. Carotid stenosis measurements (when applicable) are obtained utilizing NASCET criteria, using the distal internal carotid diameter as the denominator.  Contrast: 80mL OMNIPAQUE IOHEXOL 350 MG/ML SOLN  Comparison:  Head CT same day  CTA NECK  Findings:  Lung apices are clear.  No superior mediastinal pathology.  The branching pattern of the brachiocephalic vessels from the arch is normal.  The right common carotid artery is widely patent to the bifurcation.  There is mild atherosclerotic disease at the carotid bifurcation region.  There is no stenosis of the proximal internal carotid artery when compared to the more distal cervical ICA, but there does appear to be a small ulceration.  There are surgical clips adjacent to the midportion of the left common carotid artery.  The vessel does remain widely patent through that region.  The carotid bifurcation on the left appears normal without atherosclerotic disease, stenosis or irregularity. The left cervical internal carotid arteries widely patent.  There is mild calcific atherosclerosis of the proximal right vertebral artery which is the dominant vessel.  No stenosis is suspected however.  The left vertebral artery origin is widely patent.  Both vertebral arteries are patent through the cervical region.  No subclavian stenosis on either side.  No soft tissue lesion of the neck.  Ordinary cervical  spondylosis.   Review of the MIP images confirms the above findings.  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  There does appear to be a tiny ulceration posteriorly within the proximal ICA, not more than 1 mm in size.  Surgical clips adjacent to the common carotid artery on the left, probably related to cervical spine surgery.  No carotid bifurcation disease on the left.  CTA HEAD  Findings:  Both cervical internal carotid arteries are widely patent into the brain.  No siphon stenosis.  There is minimal atherosclerotic calcification in the supraclinoid ICA region on the right.  The anterior middle cerebral vessels are patent without proximal stenosis, aneurysm or vascular malformation.  Both vertebral arteries are patent to the basilar.  No basilar stenosis.  Posterior circulation branch vessels appear normal.  Venous structures appear normal.  The brain itself continues to have a normal appearance.   Review of the MIP images confirms the above findings.  IMPRESSION: Normal intracranial CT angiography with the exception of mild calcification of the supraclinoid internal carotid artery on the right without stenosis.   Original Report Authenticated By: Paulina Fusi, M.D.   Dg Chest Portable 1 View  01/27/2013   *RADIOLOGY REPORT*  Clinical Data: Chest pain, shortness of breath, weakness, bradycardia  PORTABLE CHEST - 1 VIEW  Comparison: 07/11/2011; 07/09/2011  Findings: Grossly unchanged enlarged cardiac silhouette and mediastinal contours with apparent differences kilovolt to decreased lung volumes and AP projection.  The transcutaneous pacer past overlying the left heart border.  Mild pulmonary venous congestion without frank evidence of edema.  Minimal bibasilar opacities, left greater than right.  No focal airspace opacity.  No definite pleural effusion.  No pneumothorax.  Unchanged bones including lower cervical ACDF.  IMPRESSION:  Pulmonary congestion without definite evidence of edema on this low lung volumes AP portable exam.   Original Report Authenticated By: Tacey Ruiz, MD    Scheduled Meds: . aspirin EC  81 mg Oral Daily  . heparin  5,000 Units Subcutaneous Q8H  . metoprolol  100 mg Oral Daily  . morphine  30 mg Oral Q12H  . phenytoin  300 mg Oral QHS  . potassium chloride SA  20 mEq Oral Daily  . sodium chloride  3 mL Intravenous Q12H  . topiramate  200 mg Oral BID   Continuous Infusions: . sodium chloride 75 mL (01/28/13 2006)    Active Problems:   Chest pain   Seizures   Syncope    Time spent: 45 min   Jeff [name], Jeff [name], Jeff [name]  [facility] Pager [phone] 7PM-7AM, please contact night-coverage at [email or web address], password [email or web address] 01/29/2013, 11:13 AM  LOS: 2 days

## 2013-01-29 NOTE — Progress Notes (Signed)
Pt c/o lips tingling.  Then head falls to the right.  Responds to sternal rub but does not follow commands.  Vs stable.  o2 sats 100%.  After 6 min. Pt wakes up.  Hands are cool.  Responsive and follows commands.   Will continue to monitor. Jeff [name]

## 2013-01-29 NOTE — Progress Notes (Signed)
LTVM inititated.

## 2013-01-29 NOTE — Progress Notes (Signed)
Off unit for chest angio per order. Pt cont to be lethargic, and unresponsive to questions asked or touch to stimulate.  Upon arrival to CT, when asked to move over to table, he opened his eyes and did so and made small conversation (very appropriate).  Back to room, family at bedside. Family kept up to date on pt's condition.

## 2013-01-29 NOTE — Progress Notes (Signed)
Family expressing concerns of not getting answers from tests.  All results explained including Chest CT, head CT, and lab work.  Discussed with Dr. Joseph Art on the phone regarding family's wishes for possible tx out to another facility.  Reassurance given as to meds and treatment plan.  Continuous EEG planned for tonight.

## 2013-01-30 DIAGNOSIS — R7989 Other specified abnormal findings of blood chemistry: Secondary | ICD-10-CM | POA: Diagnosis present

## 2013-01-30 DIAGNOSIS — E876 Hypokalemia: Secondary | ICD-10-CM | POA: Diagnosis present

## 2013-01-30 LAB — COMPREHENSIVE METABOLIC PANEL
AST: 11 U/L (ref 0–37)
Albumin: 2.9 g/dL — ABNORMAL LOW (ref 3.5–5.2)
Alkaline Phosphatase: 74 U/L (ref 39–117)
BUN: 10 mg/dL (ref 6–23)
Chloride: 112 mEq/L (ref 96–112)
Potassium: 3.8 mEq/L (ref 3.5–5.1)
Total Bilirubin: 0.2 mg/dL — ABNORMAL LOW (ref 0.3–1.2)
Total Protein: 6 g/dL (ref 6.0–8.3)

## 2013-01-30 MED ORDER — METOPROLOL TARTRATE 25 MG PO TABS
25.0000 mg | ORAL_TABLET | Freq: Two times a day (BID) | ORAL | Status: DC
  Administered 2013-01-30 – 2013-01-31 (×2): 25 mg via ORAL
  Filled 2013-01-30 (×4): qty 1

## 2013-01-30 MED ORDER — METOPROLOL TARTRATE 12.5 MG HALF TABLET
12.5000 mg | ORAL_TABLET | Freq: Once | ORAL | Status: AC
  Administered 2013-01-30: 12.5 mg via ORAL
  Filled 2013-01-30: qty 1

## 2013-01-30 MED ORDER — METOPROLOL TARTRATE 25 MG PO TABS: 25.0000 mg | ORAL_TABLET | Freq: Every day | ORAL | Status: DC

## 2013-01-30 MED ORDER — OXYCODONE HCL 5 MG PO TABS
5.0000 mg | ORAL_TABLET | Freq: Four times a day (QID) | ORAL | Status: DC | PRN
  Administered 2013-01-30 – 2013-01-31 (×2): 5 mg via ORAL
  Filled 2013-01-30 (×2): qty 1

## 2013-01-30 MED ORDER — WHITE PETROLATUM GEL: Status: DC | PRN

## 2013-01-30 MED ORDER — SALINE SPRAY 0.65 % NA SOLN
1.0000 | NASAL | Status: DC | PRN
  Filled 2013-01-30: qty 44

## 2013-01-30 NOTE — Progress Notes (Signed)
Patient had another episode of unresponsiveness after orthostatic BP, heart rate dropped down to 51, sustained in the 50 for 1 minute. Continued to be unresponsive during this time. Patient family requesting carotid doppler. Will discuss with MD

## 2013-01-30 NOTE — Progress Notes (Signed)
[facility] Progress Note [place] TEAM 1 - Stepdown/ICU TEAM   Jeff [name] ZOX:[id number] DOB: 05/23/1961 DOA: 01/27/2013 PCP: No PCP Per Patient  Brief narrative: 52 y.o. male who presented to the ED with 1 day history of intermittent episodes of chest pain and syncope. The pain was sharp in quality with radiation to L shoulder and L arm, located on the L side of his chest, with associated "numbness" in his lower jaw. He presented initially to the fire department for his chest pain.  They gave him nitro and noted that he had syncope with the episodes. In the ambulance he was noted to be bradycardic down into the mid 40s during the syncopal episodes (4 episodes per EMS).  In the ED he continued to have 2 intermittent episodes of chest pain followed by syncope but remained in NSR on the monitor during these episodes. Per family there was one episode in the ED during which he had visible convulsions during syncope.   Assessment/Plan:  "Seizures" -Patient spells consist of perioral tingling and chest tightness with associated head turning and associated unresponsiveness for about 5-7 minutes. Neurologist noted spells while being recorded on EEG revealed no evidence of seizure like activity  -CT Head normal -all EEG's including continuous show no evidence of seizure activity -appreciate Neuro assistance -Neuro has requested radiologist perform LP -possible medication related - pt on chronic narcs/other meds to treat pain -Discussed with pharmacist - Soma can cause dyskinesis, increased somnolence, and muscle spasms so will dc Soma for now. Also Oxycontin can cause similar sx's so if sx's persist after above intervention consider decrease dose of that as well.  **wife request HOLD off on LP until above work up completed - no indication to pursue urgently since no fever, leukocytosis, nuchal rigidity or other meningeal signs  **Family would like pt transferred to [facility] when deemed  stable for such - the pain clinic does not manage IP care so likely will need to ask Hospitalist team to accept   Syncope -no neurological or cardiac causes identified -consideration of adverse drug reaction as above -cont IVF for now -consider orthostasis since was on HCTZ and narcs pre admit -check OVS re: ?? autonomic dysfunction in setting of known thoracic nerve damage   HTN -BB decreased from 100 BID to 25 BID since mildly bradycardic -pre admit Diovan and HCTZ remain on hold    Hypokalemia -resolved    Positive D dimer -at [facility] -CT Chest negative    Chest pain -enzymes/EKG normal -resolved   Chronic thoracic back pain/nerve damage -followed by [facility], Dr. Cannon Kettle [phone]  -has spine stimulator - Dr. Kizzie Ide office rec check spine ray to ensure leads ok (placement at T7) - ordered -have dc'd Soma 6/30 as above -cont other home meds for now -PT eval, mattress overlay - pt normally sleeps up in chair/couch   DVT prophylaxis: Subcutaneous heparin Code Status: Full Family Communication: Patient and wife at bedside-also with nieces and dtr  Disposition Plan: SDU  Consultants: Neurology  Procedures: Continuous EEG  Antibiotics: None  HPI/Subjective: Patient alert today and primarily complaining of significant headache.  Denies chest pain shortness of breath or abdominal pain at time of examination.  Objective: Blood pressure 100/54, pulse 62, temperature 98.6 F (37 C), temperature source Oral, resp. rate 15, SpO2 100.00%.  Intake/Output Summary (Last 24 hours) at 01/30/13 1420 Last data filed at 01/30/13 1300  Gross per 24 hour  Intake   2415 ml  Output   1350 ml  Net   1065 ml   Exam: General: No acute respiratory distress Lungs: Clear to auscultation bilaterally without wheezes or crackles, RA Cardiovascular: Regular rate and rhythm without murmur gallop or rub normal S1 and S2, no peripheral edema or  JVD Abdomen: Nontender, nondistended, soft, bowel sounds positive, no rebound, no ascites, no appreciable mass Musculoskeletal: No significant cyanosis, clubbing of bilateral lower extremities Neurological: Awake and oriented x 3, moves all extremities x 4 without focal neurological deficits, CN 2-12 intact  Scheduled Meds: Scheduled Meds: . aspirin EC  81 mg Oral Daily  . heparin  5,000 Units Subcutaneous Q8H  . metoprolol  25 mg Oral BID  . morphine  30 mg Oral Q12H  . phenytoin  300 mg Oral QHS  . sodium chloride  3 mL Intravenous Q12H  . topiramate  200 mg Oral BID   Data Reviewed: Basic Metabolic Panel:  Recent Labs Lab 01/27/13 1917 01/28/13 0755 01/29/13 0540 01/30/13 0455  NA 137 141 141 142  K 4.3 3.4* 3.2* 3.8  CL 105 108 112 112  CO2 24 24 22 23   GLUCOSE 91 119* 123* 79  BUN 24* 18 12 10   CREATININE 1.28 1.07 0.98 0.99  CALCIUM 8.3* 8.2* 7.9* 8.1*   Liver Function Tests:  Recent Labs Lab 01/28/13 0115 01/29/13 0540 01/30/13 0455  AST  --  10 11  ALT  --  10 9  ALKPHOS  --  69 74  BILITOT  --  0.2* 0.2*  PROT  --  5.9* 6.0  ALBUMIN 3.6 3.0* 2.9*   CBC:  Recent Labs Lab 01/27/13 1917 01/28/13 0755  WBC 8.4 5.7  NEUTROABS 4.3  --   HGB 13.8 13.5  HCT 38.2* 37.2*  MCV 92.0 92.3  PLT 205 160   Cardiac Enzymes:  Recent Labs Lab 01/28/13 0115 01/28/13 0755 01/28/13 1300  CKTOTAL  --  46  --   CKMB  --  1.4  --   TROPONINI <0.30 <0.30 <0.30     Recent Results (from the past 240 hour(s))  MRSA PCR SCREENING     Status: None   Collection Time    01/28/13 10:40 PM      Result Value Range Status   MRSA by PCR NEGATIVE  NEGATIVE Final   Comment:            The GeneXpert MRSA Assay (FDA     approved for NASAL specimens     only), is one component of a     comprehensive MRSA colonization     surveillance program. It is not     intended to diagnose MRSA     infection nor to guide or     monitor treatment for     MRSA infections.      Studies:  Recent x-ray studies have been reviewed in detail by the Attending Physician   Junious Silk, ANP [facility] Office  [phone] Pager [phone]  **If unable to reach the above provider after paging please contact the Flow Manager @ [phone]  On-Call/Text Page:      [email or web address]      password [email or web address]  If 7PM-7AM, please contact night-coverage [email or web address] Password [email or web address] 01/30/2013, 2:20 PM   LOS: 3 days   I have personally examined this patient and reviewed the entire database. I have reviewed the above note, made any necessary editorial changes, and agree with its content.  Lonia Blood, MD [facility]

## 2013-01-30 NOTE — Plan of Care (Signed)
Family expressed concerns that pre admit pain clinic meds could be contributing to his "seizure like spells". States was told by pain MD that the Topamax could cause seizures. Review of [employer] information on this drug shows pts can have withdrawal seizures if the drug is withdrawn abruptly. Will ask pharmaist here to review all home meds for side effects that could be consistent with pts symptoms. May need to conatct pt pain clinic MD if correlation found: [name], Dr. Cannon Kettle [phone]  Discussed with pharmacist Tresa Garter can cause dyskinesis, increased somnolence, and muscle spasms so will dc Soma for now. Also Oxycontin can cause similar sx's so if sx's persist after above intervention consider decrease dose of that as well.   Jeff [name], ANP

## 2013-01-30 NOTE — Progress Notes (Signed)
Patient had an episode of unresponsiveness, BP 150/81. Patient states what precipitated unresponsiveness was he heard arguing. Also complained of chest pain to family prior to unresponsiveness episode. EKG obtain and Dr Sharon Seller paged and informed. Revonda Standard, NP on the way to evaluate patient. Upon accessment patient denies CP, only compliant of headache. No SOB, continued on 3L. Episode lasted 30 seconds. Patient had a pulse through-out unresponsiveness.

## 2013-01-30 NOTE — Progress Notes (Signed)
Spoke to Dr Sharon Seller concerning patients order to transfer for X-RAY. Per Dr Sharon Seller, ok to hold off till tomorrow. Orders updated

## 2013-01-30 NOTE — Care Management Note (Signed)
    Page 1 of 1   01/30/2013     9:58:29 AM   CARE MANAGEMENT NOTE 01/30/2013  Patient:  Jeff [name], Jeff [name]   Account Number:  [id number]  Date Initiated:  01/30/2013  Documentation initiated by:  Junius Creamer  Subjective/Objective Assessment:   adm  w syncope, r/o seizures     Action/Plan:   lives w wife   Anticipated DC Date:     Anticipated DC Plan:        DC Planning Services  CM consult      Choice offered to / List presented to:             Status of service:   Medicare Important Message given?   (If response is "NO", the following Medicare IM given date fields will be blank) Date Medicare IM given:   Date Additional Medicare IM given:    Discharge Disposition:    Per UR Regulation:  Reviewed for med. necessity/level of care/duration of stay  If discussed at Long Length of Stay Meetings, dates discussed:    Comments:

## 2013-01-30 NOTE — Progress Notes (Addendum)
Subjective: Patient continues to have spells of perioral tingling and chest tightness as well as head and turning with associated unresponsiveness. Two spells have been recorded on EEG with simultaneous video recording. Patient's brain activity remained normal during each of the spells. Patient is complaining of headache.  Objective: Current vital signs: BP 107/61  Pulse 63  Temp(Src) 97.8 F (36.6 C) (Oral)  Resp 13  SpO2 100%  Neurologic Exam: In no acute distress. Patient is well-oriented to time as well as place. Speech was normal with no dysarthria. Moved extremities equally with no signs of focal weakness.  Lab Results: Results for orders placed during the hospital encounter of 01/27/13 (from the past 48 hour(s))  TROPONIN I     Status: None   Collection Time    01/28/13  1:00 PM      Result Value Range   Troponin I <0.30  <0.30 ng/mL   Comment:            Due to the release kinetics of cTnI,     a negative result within the first hours     of the onset of symptoms does not rule out     myocardial infarction with certainty.     If myocardial infarction is still suspected,     repeat the test at appropriate intervals.  MRSA PCR SCREENING     Status: None   Collection Time    01/28/13 10:40 PM      Result Value Range   MRSA by PCR NEGATIVE  NEGATIVE   Comment:            The GeneXpert MRSA Assay (FDA     approved for NASAL specimens     only), is one component of a     comprehensive MRSA colonization     surveillance program. It is not     intended to diagnose MRSA     infection nor to guide or     monitor treatment for     MRSA infections.  COMPREHENSIVE METABOLIC PANEL     Status: Abnormal   Collection Time    01/29/13  5:40 AM      Result Value Range   Sodium 141  135 - 145 mEq/L   Potassium 3.2 (*) 3.5 - 5.1 mEq/L   Chloride 112  96 - 112 mEq/L   CO2 22  19 - 32 mEq/L   Glucose, Bld 123 (*) 70 - 99 mg/dL   BUN 12  6 - 23 mg/dL   Creatinine, Ser 9.60   0.50 - 1.35 mg/dL   Calcium 7.9 (*) 8.4 - 10.5 mg/dL   Total Protein 5.9 (*) 6.0 - 8.3 g/dL   Albumin 3.0 (*) 3.5 - 5.2 g/dL   AST 10  0 - 37 U/L   ALT 10  0 - 53 U/L   Alkaline Phosphatase 69  39 - 117 U/L   Total Bilirubin 0.2 (*) 0.3 - 1.2 mg/dL   GFR calc non Af Amer >90  >90 mL/min   GFR calc Af Amer >90  >90 mL/min   Comment:            The eGFR has been calculated     using the CKD EPI equation.     This calculation has not been     validated in all clinical     situations.     eGFR's persistently     <90 mL/min signify     possible Chronic Kidney Disease.  PHENYTOIN LEVEL, TOTAL     Status: None   Collection Time    01/29/13  5:40 AM      Result Value Range   Phenytoin Lvl 12.8  10.0 - 20.0 ug/mL  COMPREHENSIVE METABOLIC PANEL     Status: Abnormal   Collection Time    01/30/13  4:55 AM      Result Value Range   Sodium 142  135 - 145 mEq/L   Potassium 3.8  3.5 - 5.1 mEq/L   Chloride 112  96 - 112 mEq/L   CO2 23  19 - 32 mEq/L   Glucose, Bld 79  70 - 99 mg/dL   BUN 10  6 - 23 mg/dL   Creatinine, Ser 1.61  0.50 - 1.35 mg/dL   Calcium 8.1 (*) 8.4 - 10.5 mg/dL   Total Protein 6.0  6.0 - 8.3 g/dL   Albumin 2.9 (*) 3.5 - 5.2 g/dL   AST 11  0 - 37 U/L   ALT 9  0 - 53 U/L   Alkaline Phosphatase 74  39 - 117 U/L   Total Bilirubin 0.2 (*) 0.3 - 1.2 mg/dL   GFR calc non Af Amer >90  >90 mL/min   GFR calc Af Amer >90  >90 mL/min   Comment:            The eGFR has been calculated     using the CKD EPI equation.     This calculation has not been     validated in all clinical     situations.     eGFR's persistently     <90 mL/min signify     possible Chronic Kidney Disease.    Studies/Results: Ct Angio Head W/cm &/or Wo Cm  01/28/2013   *RADIOLOGY REPORT*  Clinical Data:  Episode of syncope.  Head and deviation.  Loss of consciousness.  Mental status changes.  CT ANGIOGRAPHY HEAD AND NECK  Technique:  Multidetector CT imaging of the head and neck was performed using  the standard protocol during bolus administration of intravenous contrast.  Multiplanar CT image reconstructions including MIPs were obtained to evaluate the vascular anatomy. Carotid stenosis measurements (when applicable) are obtained utilizing NASCET criteria, using the distal internal carotid diameter as the denominator.  Contrast: 80mL OMNIPAQUE IOHEXOL 350 MG/ML SOLN  Comparison:  Head CT same day  CTA NECK  Findings:  Lung apices are clear.  No superior mediastinal pathology.  The branching pattern of the brachiocephalic vessels from the arch is normal.  The right common carotid artery is widely patent to the bifurcation.  There is mild atherosclerotic disease at the carotid bifurcation region.  There is no stenosis of the proximal internal carotid artery when compared to the more distal cervical ICA, but there does appear to be a small ulceration.  There are surgical clips adjacent to the midportion of the left common carotid artery.  The vessel does remain widely patent through that region.  The carotid bifurcation on the left appears normal without atherosclerotic disease, stenosis or irregularity. The left cervical internal carotid arteries widely patent.  There is mild calcific atherosclerosis of the proximal right vertebral artery which is the dominant vessel.  No stenosis is suspected however.  The left vertebral artery origin is widely patent.  Both vertebral arteries are patent through the cervical region.  No subclavian stenosis on either side.  No soft tissue lesion of the neck.  Ordinary cervical spondylosis.   Review of the MIP images confirms  the above findings.  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  There does appear to be a tiny ulceration posteriorly within the proximal ICA, not more than 1 mm in size.  Surgical clips adjacent to the common carotid artery on the left, probably related to cervical spine surgery.   No carotid bifurcation disease on the left.  CTA HEAD  Findings:  Both cervical internal carotid arteries are widely patent into the brain.  No siphon stenosis.  There is minimal atherosclerotic calcification in the supraclinoid ICA region on the right.  The anterior middle cerebral vessels are patent without proximal stenosis, aneurysm or vascular malformation.  Both vertebral arteries are patent to the basilar.  No basilar stenosis.  Posterior circulation branch vessels appear normal.  Venous structures appear normal.  The brain itself continues to have a normal appearance.   Review of the MIP images confirms the above findings.  IMPRESSION: Normal intracranial CT angiography with the exception of mild calcification of the supraclinoid internal carotid artery on the right without stenosis.   Original Report Authenticated By: Paulina Fusi, M.D.   Ct Angio Neck W/cm &/or Wo/cm  01/28/2013   *RADIOLOGY REPORT*  Clinical Data:  Episode of syncope.  Head and deviation.  Loss of consciousness.  Mental status changes.  CT ANGIOGRAPHY HEAD AND NECK  Technique:  Multidetector CT imaging of the head and neck was performed using the standard protocol during bolus administration of intravenous contrast.  Multiplanar CT image reconstructions including MIPs were obtained to evaluate the vascular anatomy. Carotid stenosis measurements (when applicable) are obtained utilizing NASCET criteria, using the distal internal carotid diameter as the denominator.  Contrast: 80mL OMNIPAQUE IOHEXOL 350 MG/ML SOLN  Comparison:  Head CT same day  CTA NECK  Findings:  Lung apices are clear.  No superior mediastinal pathology.  The branching pattern of the brachiocephalic vessels from the arch is normal.  The right common carotid artery is widely patent to the bifurcation.  There is mild atherosclerotic disease at the carotid bifurcation region.  There is no stenosis of the proximal internal carotid artery when compared to the more distal  cervical ICA, but there does appear to be a small ulceration.  There are surgical clips adjacent to the midportion of the left common carotid artery.  The vessel does remain widely patent through that region.  The carotid bifurcation on the left appears normal without atherosclerotic disease, stenosis or irregularity. The left cervical internal carotid arteries widely patent.  There is mild calcific atherosclerosis of the proximal right vertebral artery which is the dominant vessel.  No stenosis is suspected however.  The left vertebral artery origin is widely patent.  Both vertebral arteries are patent through the cervical region.  No subclavian stenosis on either side.  No soft tissue lesion of the neck.  Ordinary cervical spondylosis.   Review of the MIP images confirms the above findings.  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  There does appear to be a tiny ulceration posteriorly within the proximal ICA, not more than 1 mm in size.  Surgical clips adjacent to the common carotid artery on the left, probably related to cervical spine surgery.  No carotid bifurcation disease on the left.  CTA HEAD  Findings:  Both cervical internal carotid arteries are widely patent into the brain.  No siphon stenosis.  There is minimal atherosclerotic calcification in  the supraclinoid ICA region on the right.  The anterior middle cerebral vessels are patent without proximal stenosis, aneurysm or vascular malformation.  Both vertebral arteries are patent to the basilar.  No basilar stenosis.  Posterior circulation branch vessels appear normal.  Venous structures appear normal.  The brain itself continues to have a normal appearance.   Review of the MIP images confirms the above findings.  IMPRESSION: Normal intracranial CT angiography with the exception of mild calcification of the supraclinoid internal carotid artery on the right without stenosis.    Original Report Authenticated By: Paulina Fusi, M.D.   Ct Angio Chest Pe W/cm &/or Wo Cm  01/29/2013   *RADIOLOGY REPORT*  Clinical Data: Elevated D-dimer.  Chest pain.  CT ANGIOGRAPHY CHEST  Technique:  Multidetector CT imaging of the chest using the standard protocol during bolus administration of intravenous contrast. Multiplanar reconstructed images including MIPs were obtained and reviewed to evaluate the vascular anatomy.  Contrast: 80mL OMNIPAQUE IOHEXOL 350 MG/ML SOLN  Comparison: None.  Findings: There are no filling defects in the pulmonary arterial tree to suggest acute pulmonary thromboembolism.  No abnormal mediastinal adenopathy.  Prominent mediastinal fat is present.  Minimal coronary artery calcification in the proximal left anterior descending.  No pneumothorax.  No pleural effusion.  Dependent atelectasis at the lung bases.  A spinal cord stimulator is in the posterior epidural space at the T7 and T8 level.  Lower thoracic laminotomy decompression has been performed.  IMPRESSION: No evidence of acute pulmonary thromboembolism.  Postoperative changes.   Original Report Authenticated By: Jolaine Click, M.D.    Medications:  I have reviewed the patient's current medications. Scheduled: . aspirin EC  81 mg Oral Daily  . heparin  5,000 Units Subcutaneous Q8H  . metoprolol  25 mg Oral Daily  . morphine  30 mg Oral Q12H  . phenytoin  300 mg Oral QHS  . sodium chloride  3 mL Intravenous Q12H  . topiramate  200 mg Oral BID   Continuous: . sodium chloride 75 mL (01/30/13 0624)   AVW:[id number], LORazepam, oxyCODONE  Assessment/Plan: No objective signs of epileptic disorder. EEG recordings during patient's typical spells showed no change and normal background activity. No cardiac arrhythmia is been demonstrated during any of the spells, as well. I suspect psychophysiologic factors are involved in patient's symptomatology. Patient has been informed that no evidence of an epileptic  disorder was seen on EEG. I will discontinue Dilantin.  Lumbar puncture is pending. CSF studies unremarkable, no further neurodiagnostic studies would be indicated. If patient's symptomatology continues, psychiatric evaluation is recommended.  C.R. Roseanne Reno, MD [facility]  [phone]  01/30/2013  9:04 AM

## 2013-01-31 ENCOUNTER — Encounter (HOSPITAL_COMMUNITY)
Admission: EM | Disposition: A | Discharge status: Home / Self Care | Payer: Self-pay | Source: Home / Self Care | Attending: Internal Medicine

## 2013-01-31 ENCOUNTER — Encounter (HOSPITAL_COMMUNITY): Payer: Self-pay | Admitting: Physician Assistant

## 2013-01-31 DIAGNOSIS — R791 Abnormal coagulation profile: Secondary | ICD-10-CM

## 2013-01-31 DIAGNOSIS — M4804 Spinal stenosis, thoracic region: Secondary | ICD-10-CM

## 2013-01-31 DIAGNOSIS — I2510 Atherosclerotic heart disease of native coronary artery without angina pectoris: Secondary | ICD-10-CM

## 2013-01-31 HISTORY — PX: CARDIAC CATHETERIZATION: SHX172

## 2013-01-31 HISTORY — PX: LEFT HEART CATHETERIZATION WITH CORONARY ANGIOGRAM: SHX5451

## 2013-01-31 LAB — CBC
HCT: 35.8 % — ABNORMAL LOW (ref 39.0–52.0)
Hemoglobin: 12.8 g/dL — ABNORMAL LOW (ref 13.0–17.0)
MCH: 32.8 pg (ref 26.0–34.0)
MCHC: 35.8 g/dL (ref 30.0–36.0)
RBC: 3.9 MIL/uL — ABNORMAL LOW (ref 4.22–5.81)

## 2013-01-31 LAB — LIPID PANEL
Cholesterol: 156 mg/dL (ref 0–200)
LDL Cholesterol: 103 mg/dL — ABNORMAL HIGH (ref 0–99)
VLDL: 25 mg/dL (ref 0–40)

## 2013-01-31 LAB — GLUCOSE, CAPILLARY: Glucose-Capillary: 87 mg/dL (ref 70–99)

## 2013-01-31 LAB — CREATININE, SERUM: Creatinine, Ser: 0.9 mg/dL (ref 0.50–1.35)

## 2013-01-31 SURGERY — LEFT HEART CATHETERIZATION WITH CORONARY ANGIOGRAM
Anesthesia: LOCAL

## 2013-01-31 MED ORDER — ASPIRIN EC 325 MG PO TBEC: 325.0000 mg | DELAYED_RELEASE_TABLET | Freq: Every day | ORAL | Status: DC

## 2013-01-31 MED ORDER — DIAZEPAM 5 MG PO TABS: 5.0000 mg | ORAL_TABLET | ORAL | Status: DC

## 2013-01-31 MED ORDER — CARISOPRODOL 350 MG PO TABS: 350.0000 mg | ORAL_TABLET | Freq: Three times a day (TID) | ORAL | Status: DC | PRN

## 2013-01-31 MED ORDER — ONDANSETRON HCL 4 MG/2ML IJ SOLN: 4.0000 mg | Freq: Four times a day (QID) | INTRAMUSCULAR | Status: DC | PRN

## 2013-01-31 MED ORDER — SODIUM CHLORIDE 0.9 % IJ SOLN
3.0000 mL | Freq: Two times a day (BID) | INTRAMUSCULAR | Status: DC
  Administered 2013-01-31: 3 mL via INTRAVENOUS

## 2013-01-31 MED ORDER — SODIUM CHLORIDE 0.9 % IV SOLN: INTRAVENOUS | Status: DC

## 2013-01-31 MED ORDER — HYDROMORPHONE HCL PF 1 MG/ML IJ SOLN
INTRAMUSCULAR | Status: AC
  Filled 2013-01-31: qty 1

## 2013-01-31 MED ORDER — METOPROLOL TARTRATE 25 MG PO TABS: 25.0000 mg | ORAL_TABLET | Freq: Two times a day (BID) | ORAL | Status: DC

## 2013-01-31 MED ORDER — LIDOCAINE HCL (PF) 1 % IJ SOLN
INTRAMUSCULAR | Status: AC
  Filled 2013-01-31: qty 30

## 2013-01-31 MED ORDER — ACETAMINOPHEN 325 MG PO TABS: 650.0000 mg | ORAL_TABLET | ORAL | Status: DC | PRN

## 2013-01-31 MED ORDER — NITROGLYCERIN 0.2 MG/ML ON CALL CATH LAB
INTRAVENOUS | Status: AC
  Filled 2013-01-31: qty 1

## 2013-01-31 MED ORDER — ASPIRIN 81 MG PO CHEW: 324.0000 mg | CHEWABLE_TABLET | ORAL | Status: DC

## 2013-01-31 MED ORDER — HEPARIN SODIUM (PORCINE) 5000 UNIT/ML IJ SOLN
5000.0000 [IU] | Freq: Three times a day (TID) | INTRAMUSCULAR | Status: DC
  Filled 2013-01-31: qty 1

## 2013-01-31 MED ORDER — HEPARIN (PORCINE) IN NACL 2-0.9 UNIT/ML-% IJ SOLN
INTRAMUSCULAR | Status: AC
  Filled 2013-01-31: qty 1000

## 2013-01-31 MED ORDER — SODIUM CHLORIDE 0.9 % IJ SOLN: 3.0000 mL | INTRAMUSCULAR | Status: DC | PRN

## 2013-01-31 MED ORDER — SODIUM CHLORIDE 0.9 % IV SOLN: 250.0000 mL | INTRAVENOUS | Status: DC | PRN

## 2013-01-31 NOTE — Progress Notes (Signed)
Report called to Maralyn Sago, RN receiving nurse at [facility]. Patients room # there will be A560. Family notified of patient room number and contact # [phone]. [facility] notified for transfer; awaiting a call back from [facility] with specific time of transfer. Family made aware of this as well.   Dawson Bills, RN

## 2013-01-31 NOTE — Discharge Summary (Addendum)
Physician Discharge Summary  Jeff [name] QIO:[id number] DOB: 1961/07/17 DOA: 01/27/2013  PCP: No PCP Per Patient  Admit date: 01/27/2013 Discharge date: 01/31/2013  Time spent: 30 minutes  Recommendations for Outpatient Follow-up:  1. At discretion of discharging physician at accepting facility.  Discharge Diagnoses:  Active Problems:   Non cardiac Syncope   Hypokalemia   Positive D dimer with negative CT Chest   Chest pain with non obstructive CAD per cath 01/31/13   Chronic thoracic back pain with lower extremity paresthesias   Cervical neck pain with radiculopathy   Discharge Condition: stable  Diet recommendation: Heart Healthy  There were no vitals filed for this visit.  History of present illness:  52 y.o. male who presented to the ED with 1 day history of intermittent episodes of chest pain and syncope. The pain was sharp in quality with radiation to L shoulder and L arm, located on the L side of his chest, with associated "numbness" in his lower jaw. He presented initially to the fire department for his chest pain. They gave him nitro and noted that he had syncope with the episodes. In the ambulance he was noted to be bradycardic down into the mid 40s during the syncopal episodes (4 episodes per EMS).  In the ED he continued to have 2 intermittent episodes of chest pain followed by syncope but remained in NSR on the monitor during these episodes. Per family there was one episode in the ED during which he had visible convulsions during syncope   Hospital Course:  Numbness/ tingling -Patient spells consist of perioral tingling and chest tightness with associated right dominant head turning and associated unresponsiveness for about 5-7 minutes. Neurologist noted "spells" during continuous EEG but no evidence of seizures. Pt is completely flaccid without any eye movement or response to painful stimulus during these spells but once awake remembers when/where pain stimulus applied  and people talking in his room -CT Head normal  -all EEG's including continuous show no evidence of seizure activity  -appreciate Neuro assistance  -Neuro considered LP but no objective signs of meningitis so did not pursue  -Discussed with pharmacist - Soma can cause dyskinesis, increased somnolence, and muscle spasms so dc'd. Unfortunately no improvement in symptoms so Soma resumed after 24 hours. **Family would like pt transferred to [facility] when deemed stable for such - the pain clinic does not manage IP care so likely will need to ask Hospitalist team to accept -Dr. Butler Denmark has discussed with Neurology at [facility]- they have discussed with Neurologist here (Dr. Roseanne Reno) and have accepted pt in transfer. -Dr. Butler Denmark did discuss with family possible need to involve psychiatry  Unexplained Syncope  -no neurological or cardiac causes identified  -consideration of adverse drug reaction as above but symptoms continued despite dc of Soma -orthostatic vital signs were negative  -?? autonomic dysfunction in setting of known thoracic nerve damage    HTN  -BB decreased from 100 BID to 25 BID since mildly bradycardic  -pre admit Diovan and HCTZ remain on hold   Hypokalemia  -resolved   Positive D dimer  -at [facility]  -CT Chest negative   Chest pain  -enzymes/EKG normal but symptoms were concerning for unstable angina with atypical presentation so Cardiology was consulted. -He underwent cardiac catheterization 01/31/13 which demonstrated non obstructive disease (30%) to the LAD -recommendation is for medical management: already on beta blocker pre admit- will increase aspirin to full dose  Chronic thoracic back pain with severe paresthesia -followed by  [facility], Dr. Cannon Kettle [phone]  -has spine stimulator - Dr. Kizzie Ide office rec check spine ray to ensure leads ok (placement at T7) - ordered  -have dc'd Soma 6/30 as above  -cont other home meds for  now  -PT eval, mattress overlay - pt normally sleeps up in chair/couch   History cervical spine surgery/recurrent radiculopathy -reproducible pain in neck and down left arm with palpation-apparent recent fall with injury to neck -CT C-spine pending 7/2 since had cardiac cath 7/1   Procedures: 2D ECHO:  - Left ventricle: The cavity size was normal. Wall thickness was top normal. Systolic function was normal. The estimated ejection fraction was in the range of 55% to 60%. Wall motion was normal; there were no regional wall motion abnormalities. Left ventricular diastolic function parameters were normal. - Left atrium: The atrium was normal in size. - Pulmonic valve: Trivial regurgitation. - Pulmonary arteries: PA peak pressure: 32mm Hg (S). - Systemic veins: The IVC was not visualized.  EEG:  No seizure activity seen on basic or continuous EEG  Cardiac Catheterization: Coronary dominance: Right  Left mainstem: Distal left main calcification.  Left anterior descending (LAD): Mild proximal calcification with long proximal 30% stenosis. Mild proximal luminal irregularities. Mid diagonal moderate sized and normal.  Left circumflex (LCx): AV groove normal. RI moderate sized and normal. MOM 1 small and normal. MOM 2 moderate sized and normal. PL moderate sized and normal.  Right coronary artery (RCA): Dominant and large. Normal. Large PDA normal.  Left ventriculography: Left ventricular systolic function is normal, LVEF is estimated at 65%, there is no significant mitral regurgitation  Final Conclusions: Mild coronary calcification and plaque. NL LV function.  Recommendations: Medical management    Consultations:  Neurology  Cardiology  Discharge Exam: Filed Vitals:   01/31/13 0819 01/31/13 0822 01/31/13 0823 01/31/13 1124  BP: 139/82 146/84 140/76   Pulse: 63 74 63 59  Temp:      TempSrc:      Resp: 13 18 12    SpO2: 100% 100% 99%    General: No acute respiratory distress   Lungs: Clear to auscultation bilaterally without wheezes or crackles, RA  Cardiovascular: Regular bradycardic rate and rhythm without murmur gallop or rub normal S1 and S2, no peripheral edema or JVD  Abdomen: Nontender, nondistended, soft, bowel sounds positive, no rebound, no ascites, no appreciable mass  Musculoskeletal: No significant cyanosis, clubbing of bilateral lower extremities  Neurological: Awake and oriented x 3, moves all extremities x 4 without focal neurological deficits, CN 2-12 intact, extreme pain reproduced with minimal touch to bilateral legs and soles of feet c/w severe paresthesia   Discharge Instructions     Medication List    STOP taking these medications       potassium chloride SA 20 MEQ tablet  Commonly known as:  K-DUR,KLOR-CON     valsartan-hydrochlorothiazide 160-25 MG per tablet  Commonly known as:  DIOVAN-HCT      TAKE these medications       aspirin EC 325 MG tablet  Take 1 tablet (325 mg total) by mouth daily.     carisoprodol 350 MG tablet  Commonly known as:  SOMA  Take 350 mg by mouth 3 (three) times daily as needed for muscle spasms.     metoprolol tartrate 25 MG tablet  Commonly known as:  LOPRESSOR  Take 1 tablet (25 mg total) by mouth 2 (two) times daily.     Oxycodone HCl 10 MG Tabs  Take  10 mg by mouth 4 (four) times daily as needed (for pain).     oxymorphone 30 MG 12 hr tablet  Commonly known as:  OPANA ER  Take 30 mg by mouth every 12 (twelve) hours.     topiramate 100 MG tablet  Commonly known as:  TOPAMAX  Take 200 mg by mouth 2 (two) times daily.       Allergies  Allergen Reactions  . Cymbalta (Duloxetine Hcl) Other (See Comments)    "jittery" and "loopy" per pt  . Gabapentin Other (See Comments)    "jittery" and "loopy" per pt.  . Keppra (Levetiracetam) Other (See Comments)    "jittery", and "loopy." per pt.  . Lyrica (Pregabalin) Swelling      The results of significant diagnostics from this  hospitalization (including imaging, microbiology, ancillary and laboratory) are listed below for reference.    Significant Diagnostic Studies: Ct Angio Head W/cm &/or Wo Cm  01/28/2013   *RADIOLOGY REPORT*  Clinical Data:  Episode of syncope.  Head and deviation.  Loss of consciousness.  Mental status changes.  CT ANGIOGRAPHY HEAD AND NECK  Technique:  Multidetector CT imaging of the head and neck was performed using the standard protocol during bolus administration of intravenous contrast.  Multiplanar CT image reconstructions including MIPs were obtained to evaluate the vascular anatomy. Carotid stenosis measurements (when applicable) are obtained utilizing NASCET criteria, using the distal internal carotid diameter as the denominator.  Contrast: 80mL OMNIPAQUE IOHEXOL 350 MG/ML SOLN  Comparison:  Head CT same day  CTA NECK  Findings:  Lung apices are clear.  No superior mediastinal pathology.  The branching pattern of the brachiocephalic vessels from the arch is normal.  The right common carotid artery is widely patent to the bifurcation.  There is mild atherosclerotic disease at the carotid bifurcation region.  There is no stenosis of the proximal internal carotid artery when compared to the more distal cervical ICA, but there does appear to be a small ulceration.  There are surgical clips adjacent to the midportion of the left common carotid artery.  The vessel does remain widely patent through that region.  The carotid bifurcation on the left appears normal without atherosclerotic disease, stenosis or irregularity. The left cervical internal carotid arteries widely patent.  There is mild calcific atherosclerosis of the proximal right vertebral artery which is the dominant vessel.  No stenosis is suspected however.  The left vertebral artery origin is widely patent.  Both vertebral arteries are patent through the cervical region.  No subclavian stenosis on either side.  No soft tissue lesion of the neck.   Ordinary cervical spondylosis.   Review of the MIP images confirms the above findings.  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  There does appear to be a tiny ulceration posteriorly within the proximal ICA, not more than 1 mm in size.  Surgical clips adjacent to the common carotid artery on the left, probably related to cervical spine surgery.  No carotid bifurcation disease on the left.  CTA HEAD  Findings:  Both cervical internal carotid arteries are widely patent into the brain.  No siphon stenosis.  There is minimal atherosclerotic calcification in the supraclinoid ICA region on the right.  The anterior middle cerebral vessels are patent without proximal stenosis, aneurysm or vascular malformation.  Both vertebral arteries are patent to the basilar.  No basilar stenosis.  Posterior circulation branch vessels appear normal.  Venous  structures appear normal.  The brain itself continues to have a normal appearance.   Review of the MIP images confirms the above findings.  IMPRESSION: Normal intracranial CT angiography with the exception of mild calcification of the supraclinoid internal carotid artery on the right without stenosis.   Original Report Authenticated By: Paulina Fusi, M.D.   Ct Head Wo Contrast  01/28/2013   *RADIOLOGY REPORT*  Clinical Data: Seizure  CT HEAD WITHOUT CONTRAST  Technique:  Contiguous axial images were obtained from the base of the skull through the vertex without contrast.  Comparison: None.  Findings: No acute intracranial abnormality is present. Specifically, there is no evidence for acute infarct, hemorrhage, mass, hydrocephalus, or extra-axial fluid collection.  The paranasal sinuses and mastoid air cells are clear.  The globes and orbits are intact.  The osseous skull is intact.  IMPRESSION: Negative CT of the head.   Original Report Authenticated By: Marin Roberts, M.D.   Ct Angio Neck W/cm &/or  Wo/cm  01/28/2013   *RADIOLOGY REPORT*  Clinical Data:  Episode of syncope.  Head and deviation.  Loss of consciousness.  Mental status changes.  CT ANGIOGRAPHY HEAD AND NECK  Technique:  Multidetector CT imaging of the head and neck was performed using the standard protocol during bolus administration of intravenous contrast.  Multiplanar CT image reconstructions including MIPs were obtained to evaluate the vascular anatomy. Carotid stenosis measurements (when applicable) are obtained utilizing NASCET criteria, using the distal internal carotid diameter as the denominator.  Contrast: 80mL OMNIPAQUE IOHEXOL 350 MG/ML SOLN  Comparison:  Head CT same day  CTA NECK  Findings:  Lung apices are clear.  No superior mediastinal pathology.  The branching pattern of the brachiocephalic vessels from the arch is normal.  The right common carotid artery is widely patent to the bifurcation.  There is mild atherosclerotic disease at the carotid bifurcation region.  There is no stenosis of the proximal internal carotid artery when compared to the more distal cervical ICA, but there does appear to be a small ulceration.  There are surgical clips adjacent to the midportion of the left common carotid artery.  The vessel does remain widely patent through that region.  The carotid bifurcation on the left appears normal without atherosclerotic disease, stenosis or irregularity. The left cervical internal carotid arteries widely patent.  There is mild calcific atherosclerosis of the proximal right vertebral artery which is the dominant vessel.  No stenosis is suspected however.  The left vertebral artery origin is widely patent.  Both vertebral arteries are patent through the cervical region.  No subclavian stenosis on either side.  No soft tissue lesion of the neck.  Ordinary cervical spondylosis.   Review of the MIP images confirms the above findings.  IMPRESSION: There is premature atherosclerosis at the carotid bifurcation on the  right, though there is no stenosis.  There does appear to be a tiny ulceration posteriorly within the proximal ICA, not more than 1 mm in size.  Surgical clips adjacent to the common carotid artery on the left, probably related to cervical spine surgery.  No carotid bifurcation disease on the left.  CTA HEAD  Findings:  Both cervical internal carotid arteries are widely patent into the brain.  No siphon stenosis.  There is minimal atherosclerotic calcification in the supraclinoid ICA region on the right.  The anterior middle cerebral vessels are patent without proximal stenosis, aneurysm or vascular malformation.  Both vertebral arteries are patent to the basilar.  No basilar stenosis.  Posterior circulation branch vessels appear normal.  Venous structures appear normal.  The brain itself continues to have a normal appearance.   Review of the MIP images confirms the above findings.  IMPRESSION: Normal intracranial CT angiography with the exception of mild calcification of the supraclinoid internal carotid artery on the right without stenosis.   Original Report Authenticated By: Paulina Fusi, M.D.   Ct Angio Chest Pe W/cm &/or Wo Cm  01/29/2013   *RADIOLOGY REPORT*  Clinical Data: Elevated D-dimer.  Chest pain.  CT ANGIOGRAPHY CHEST  Technique:  Multidetector CT imaging of the chest using the standard protocol during bolus administration of intravenous contrast. Multiplanar reconstructed images including MIPs were obtained and reviewed to evaluate the vascular anatomy.  Contrast: 80mL OMNIPAQUE IOHEXOL 350 MG/ML SOLN  Comparison: None.  Findings: There are no filling defects in the pulmonary arterial tree to suggest acute pulmonary thromboembolism.  No abnormal mediastinal adenopathy.  Prominent mediastinal fat is present.  Minimal coronary artery calcification in the proximal left anterior descending.  No pneumothorax.  No pleural effusion.  Dependent atelectasis at the lung bases.  A spinal cord stimulator is in  the posterior epidural space at the T7 and T8 level.  Lower thoracic laminotomy decompression has been performed.  IMPRESSION: No evidence of acute pulmonary thromboembolism.  Postoperative changes.   Original Report Authenticated By: Jolaine Click, M.D.   Dg Chest Portable 1 View  01/27/2013   *RADIOLOGY REPORT*  Clinical Data: Chest pain, shortness of breath, weakness, bradycardia  PORTABLE CHEST - 1 VIEW  Comparison: 07/11/2011; 07/09/2011  Findings: Grossly unchanged enlarged cardiac silhouette and mediastinal contours with apparent differences kilovolt to decreased lung volumes and AP projection.  The transcutaneous pacer past overlying the left heart border.  Mild pulmonary venous congestion without frank evidence of edema.  Minimal bibasilar opacities, left greater than right.  No focal airspace opacity.  No definite pleural effusion.  No pneumothorax.  Unchanged bones including lower cervical ACDF.  IMPRESSION: Pulmonary congestion without definite evidence of edema on this low lung volumes AP portable exam.   Original Report Authenticated By: Tacey Ruiz, MD    Microbiology: Recent Results (from the past 240 hour(s))  MRSA PCR SCREENING     Status: None   Collection Time    01/28/13 10:40 PM      Result Value Range Status   MRSA by PCR NEGATIVE  NEGATIVE Final   Comment:            The GeneXpert MRSA Assay (FDA     approved for NASAL specimens     only), is one component of a     comprehensive MRSA colonization     surveillance program. It is not     intended to diagnose MRSA     infection nor to guide or     monitor treatment for     MRSA infections.     Labs: Basic Metabolic Panel:  Recent Labs Lab 01/27/13 1917 01/28/13 0755 01/29/13 0540 01/30/13 0455  NA 137 141 141 142  K 4.3 3.4* 3.2* 3.8  CL 105 108 112 112  CO2 24 24 22 23   GLUCOSE 91 119* 123* 79  BUN 24* 18 12 10   CREATININE 1.28 1.07 0.98 0.99  CALCIUM 8.3* 8.2* 7.9* 8.1*   Liver Function  Tests:  Recent Labs Lab 01/28/13 0115 01/29/13 0540 01/30/13 0455  AST  --  10 11  ALT  --  10 9  ALKPHOS  --  69  74  BILITOT  --  0.2* 0.2*  PROT  --  5.9* 6.0  ALBUMIN 3.6 3.0* 2.9*   No results found for this basename: LIPASE, AMYLASE,  in the last 168 hours No results found for this basename: AMMONIA,  in the last 168 hours CBC:  Recent Labs Lab 01/27/13 1917 01/28/13 0755  WBC 8.4 5.7  NEUTROABS 4.3  --   HGB 13.8 13.5  HCT 38.2* 37.2*  MCV 92.0 92.3  PLT 205 160   Cardiac Enzymes:  Recent Labs Lab 01/28/13 0115 01/28/13 0755 01/28/13 1300  CKTOTAL  --  46  --   CKMB  --  1.4  --   TROPONINI <0.30 <0.30 <0.30   BNP: BNP (last 3 results) No results found for this basename: PROBNP,  in the last 8760 hours CBG:  Recent Labs Lab 01/31/13 1151  GLUCAP 87       Signed:  ELLIS,ALLISON L. ANP  [facility] 01/31/2013, 12:50 PM   I have examined the patient, reviewed the chart and modified the above note which I agree with.   Sola Margolis,MD [phone] 01/31/2013, 2:45 PM

## 2013-01-31 NOTE — H&P (View-Only) (Signed)
 CARDIOLOGY CONSULT NOTE   Patient ID: Jeff [name] MRN: [id number] DOB/AGE: 12/17/1960 52 y.o.  Admit date: 01/27/2013  Primary Physician   Dr Richard Gilbert, [facility] Primary Cardiologist   None  Reason for Consultation   syncope  HPI:Joshawn R Bryand is a 52 y.o. male with no history of CAD or arrhythmia. He had chest pain associated with syncope and bradycardia with HR 40s.  Pt and family in room. They agree that sharp chest pain and lip numbness are the only symptoms consistently associated with the syncope. His head will turn to the right. He generally wakes up with a headache all over, worse with longer or worse episodes. Has been on pain medications and followed by the pain clinic since surgery in 2012. Nerve stimulator implanted last year but it does not help.   While interviewing patient, he sat up to get lung sounds and had another episode, lasting several minutes. His BP/HR remained stable. He did not respond to a sternal rub. When he began coming around, respirations were very deep and regular, rate slightly increased, about 20. His eyes fluttered to touch of his eyelids but extremities were flaccid. No response to sternal rub. Per family reports, he will remember painful stimulus even though he does not respond to it.   Pt had a second episode that last 3 minutes. He came out of it with chest burning at a 4/10.    Past Medical History  Diagnosis Date  . Hypertension   . Tuberculosis     POSITIVE  TB SKIN TEST 1992.6 MTH TX   . Neuromuscular disorder     "back nerve stimulator"     Past Surgical History  Procedure Laterality Date  . Cervical fusion    . Carpal tunnel release    . Knee arthroscopy    . Lumbar laminectomy/decompression microdiscectomy  06/22/2011    Procedure: LUMBAR LAMINECTOMY/DECOMPRESSION MICRODISCECTOMY;  Surgeon: Jeffrey D Jenkins;  Location: [facility];  Service: Neurosurgery;  Laterality: N/A;  Thoracic  Ten-Eleven,Thoracic Eleven-Twelve Laminectomy    Allergies  Allergen Reactions  . Cymbalta (Duloxetine Hcl) Other (See Comments)    "jittery" and "loopy" per pt  . Gabapentin Other (See Comments)    "jittery" and "loopy" per pt.  . Keppra (Levetiracetam) Other (See Comments)    "jittery", and "loopy." per pt.  . Lyrica (Pregabalin) Swelling    I have reviewed the patient's current medications . aspirin EC  81 mg Oral Daily  . heparin  5,000 Units Subcutaneous Q8H  . metoprolol  25 mg Oral BID  . morphine  30 mg Oral Q12H  . sodium chloride  3 mL Intravenous Q12H  . topiramate  200 mg Oral BID   . sodium chloride 75 mL/hr (01/30/13 2154)   carisoprodol, LORazepam, oxyCODONE, sodium chloride, white petrolatum  Prior to Admission medications   Medication Sig Start Date End Date Taking? Authorizing Provider  aspirin EC 81 MG tablet Take 81 mg by mouth daily.     Yes Historical Provider, MD  carisoprodol (SOMA) 350 MG tablet Take 350 mg by mouth 3 (three) times daily as needed for muscle spasms.   Yes Historical Provider, MD  metoprolol (LOPRESSOR) 100 MG tablet Take 100 mg by mouth daily.    Yes Historical Provider, MD  Oxycodone HCl 10 MG TABS Take 10 mg by mouth 4 (four) times daily as needed (for pain).   Yes Historical Provider, MD  oxymorphone (OPANA ER) 30 MG 12 hr tablet   Take 30 mg by mouth every 12 (twelve) hours.   Yes Historical Provider, MD  potassium chloride SA (K-DUR,KLOR-CON) 20 MEQ tablet Take 20 mEq by mouth daily.     Yes Historical Provider, MD  topiramate (TOPAMAX) 100 MG tablet Take 200 mg by mouth 2 (two) times daily.   Yes Historical Provider, MD  valsartan-hydrochlorothiazide (DIOVAN-HCT) 160-25 MG per tablet Take 1 tablet by mouth daily.     Yes Historical Provider, MD     History   Social History  . Marital Status: Married    Spouse Name: N/A    Number of Children: N/A  . Years of Education: N/A   Occupational History  . works at [place] [place]  [employer]   . DETECTIVE  [place]   Social History Main Topics  . Smoking status: Former Smoker -- 1.50 packs/day for 10 years    Types: Cigarettes    Quit date: 07/08/1995  . Smokeless tobacco: Former User    Quit date: 12/16/1995  . Alcohol Use: Yes  . Drug Use: No  . Sexually Active: Yes    Birth Control/ Protection: None   Other Topics Concern  . 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    Family Status  Relation Status Death Age  . Mother Alive   . Father Alive    Family History  Problem Relation Age of Onset  . Coronary artery disease Father     PPM in his late 60s, CAD Dx 60s  . Atrial fibrillation Mother   . Hyperlipidemia Father      ROS:  Full 14 point review of systems complete and found to be negative unless listed above.  Physical Exam: Blood pressure 140/76, pulse 63, temperature 98.4 F (36.9 C), temperature source Oral, resp. rate 12, SpO2 99.00%.  139/82 146/84 140/76 101        102        94  Lying    Sitting   Standing  General: Well developed, well nourished, male in no acute distress Head: Eyes PERRLA, No xanthomas.   Normocephalic and atraumatic, oropharynx without edema or exudate. Dentition:  Lungs:  Heart: HRRR S1 S2, no rub/gallop, Heart irregular rate and rhythm with S1, S2  murmur. pulses are 2+ extrem.   Neck: No carotid bruits. No lymphadenopathy.  JVD. Abdomen: Bowel sounds present, abdomen soft and non-tender without masses or hernias noted. Msk:  No spine or cva tenderness. No weakness, no joint deformities or effusions. Extremities: No clubbing or cyanosis.  edema.  Neuro: Alert and oriented X 3. No focal deficits noted. Psych:  Good affect, responds appropriately Skin: No rashes or lesions noted.  Labs:   Lab Results  Component Value Date   WBC 5.7 01/28/2013   HGB 13.5 01/28/2013   HCT 37.2* 01/28/2013   MCV 92.3 01/28/2013   PLT 160 01/28/2013   No results found for this  basename: INR,  in the last 72 hours   Recent Labs Lab 01/30/13 0455  NA 142  K 3.8  CL 112  CO2 23  BUN 10  CREATININE 0.99  CALCIUM 8.1*  PROT 6.0  BILITOT 0.2*  ALKPHOS 74  ALT 9  AST 11  GLUCOSE 79    Recent Labs  01/28/13 1300  TROPONINI <0.30   Echo: 01/28/2013 Study Conclusions - Left ventricle: The cavity size was normal. Wall thickness was top normal. Systolic function was normal. The estimated   ejection fraction was in the range of 55% to 60%. Wall motion was normal; there were no regional wall motion abnormalities. Left ventricular diastolic function parameters were normal. - Left atrium: The atrium was normal in size. - Pulmonic valve: Trivial regurgitation. - Pulmonary arteries: PA peak pressure: 32mm Hg (S). - Systemic veins: The IVC was not visualized.  ECG:   SR, S brady at times, no acute changes  Radiology:  Ct Angio Chest Pe W/cm &/or Wo Cm 01/29/2013   *RADIOLOGY REPORT*  Clinical Data: Elevated D-dimer.  Chest pain.  CT ANGIOGRAPHY CHEST  Technique:  Multidetector CT imaging of the chest using the standard protocol during bolus administration of intravenous contrast. Multiplanar reconstructed images including MIPs were obtained and reviewed to evaluate the vascular anatomy.  Contrast: 80mL OMNIPAQUE IOHEXOL 350 MG/ML SOLN  Comparison: None.  Findings: There are no filling defects in the pulmonary arterial tree to suggest acute pulmonary thromboembolism.  No abnormal mediastinal adenopathy.  Prominent mediastinal fat is present.  Minimal coronary artery calcification in the proximal left anterior descending.  No pneumothorax.  No pleural effusion.  Dependent atelectasis at the lung bases.  A spinal cord stimulator is in the posterior epidural space at the T7 and T8 level.  Lower thoracic laminotomy decompression has been performed.  IMPRESSION: No evidence of acute pulmonary thromboembolism.  Postoperative changes.   Original Report Authenticated By: Arthur  Hoss, M.D.    ASSESSMENT AND PLAN:   The patient was seen today by Dr Sadao Weyer, the patient evaluated and the data reviewed.  Active Problems:   Chest pain   Seizures   Syncope   Hypokalemia   Positive D dimer   Signed: Rhonda Barrett, PA-C 01/31/2013 10:23 AM Beeper [phone]  Co-Sign MD Seen on 2900 with Rhonda Barrett, PA-C.  Difficult diagnostic problem. He is having burning left chest pain at the present time. There is jaw pain and numbness. He had two episodes of unresponsiveness while we were in the room.  His pulse and BP did not change during these episodes.  Family notes that the patient's father had CAD with very atypical presentation which remained enigmatic until cardiac cath was finally done and patient received stents. Cholesterol status unknown although patient states that he always passes his physical exams without comment on cholesterol. Management of his symptoms will depend on establishing an etiology. EEG during episodes of unresponsiveness did not reveal any etiology. There are both neurologic and possible cardiac components present. With ongoing chest discomfort will proceed with cardiac cath now to guide future therapy and management. 

## 2013-01-31 NOTE — Consult Note (Signed)
CARDIOLOGY CONSULT NOTE   Patient ID: Jeff [name] MRN: [id number] DOB/AGE: 04-Apr-1961 52 y.o.  Admit date: 01/27/2013  Primary Physician   Dr Julieanne Manson, [facility] Primary Cardiologist   None  Reason for Consultation   syncope  GEX:BMWUXL R Piscopo is a 52 y.o. male with no history of CAD or arrhythmia. He had chest pain associated with syncope and bradycardia with HR 40s.  Pt and family in room. They agree that sharp chest pain and lip numbness are the only symptoms consistently associated with the syncope. His head will turn to the right. He generally wakes up with a headache all over, worse with longer or worse episodes. Has been on pain medications and followed by the pain clinic since surgery in 2012. Nerve stimulator implanted last year but it does not help.   While interviewing patient, he sat up to get lung sounds and had another episode, lasting several minutes. His BP/HR remained stable. He did not respond to a sternal rub. When he began coming around, respirations were very deep and regular, rate slightly increased, about 20. His eyes fluttered to touch of his eyelids but extremities were flaccid. No response to sternal rub. Per family reports, he will remember painful stimulus even though he does not respond to it.   Pt had a second episode that last 3 minutes. He came out of it with chest burning at a 4/10.    Past Medical History  Diagnosis Date  . Hypertension   . Tuberculosis     POSITIVE  TB SKIN TEST 1992.6 MTH TX   . Neuromuscular disorder     "back nerve stimulator"     Past Surgical History  Procedure Laterality Date  . Cervical fusion    . Carpal tunnel release    . Knee arthroscopy    . Lumbar laminectomy/decompression microdiscectomy  06/22/2011    Procedure: LUMBAR LAMINECTOMY/DECOMPRESSION MICRODISCECTOMY;  Surgeon: Cristi Loron;  Location: [facility];  Service: Neurosurgery;  Laterality: N/A;  Thoracic  Ten-Eleven,Thoracic Eleven-Twelve Laminectomy    Allergies  Allergen Reactions  . Cymbalta (Duloxetine Hcl) Other (See Comments)    "jittery" and "loopy" per pt  . Gabapentin Other (See Comments)    "jittery" and "loopy" per pt.  . Keppra (Levetiracetam) Other (See Comments)    "jittery", and "loopy." per pt.  . Lyrica (Pregabalin) Swelling    I have reviewed the patient's current medications . aspirin EC  81 mg Oral Daily  . heparin  5,000 Units Subcutaneous Q8H  . metoprolol  25 mg Oral BID  . morphine  30 mg Oral Q12H  . sodium chloride  3 mL Intravenous Q12H  . topiramate  200 mg Oral BID   . sodium chloride 75 mL/hr (01/30/13 2154)   carisoprodol, LORazepam, oxyCODONE, sodium chloride, white petrolatum  Prior to Admission medications   Medication Sig Start Date End Date Taking? Authorizing Provider  aspirin EC 81 MG tablet Take 81 mg by mouth daily.     Yes Historical Provider, MD  carisoprodol (SOMA) 350 MG tablet Take 350 mg by mouth 3 (three) times daily as needed for muscle spasms.   Yes Historical Provider, MD  metoprolol (LOPRESSOR) 100 MG tablet Take 100 mg by mouth daily.    Yes Historical Provider, MD  Oxycodone HCl 10 MG TABS Take 10 mg by mouth 4 (four) times daily as needed (for pain).   Yes Historical Provider, MD  oxymorphone (OPANA ER) 30 MG 12 hr tablet  Take 30 mg by mouth every 12 (twelve) hours.   Yes Historical Provider, MD  potassium chloride SA (K-DUR,KLOR-CON) 20 MEQ tablet Take 20 mEq by mouth daily.     Yes Historical Provider, MD  topiramate (TOPAMAX) 100 MG tablet Take 200 mg by mouth 2 (two) times daily.   Yes Historical Provider, MD  valsartan-hydrochlorothiazide (DIOVAN-HCT) 160-25 MG per tablet Take 1 tablet by mouth daily.     Yes Historical Provider, MD     History   Social History  . Marital Status: Married    Spouse Name: N/A    Number of Children: N/A  . Years of Education: N/A   Occupational History  . works at [employer]   . DETECTIVE  [employer]   Social History Main Topics  . Smoking status: Former Smoker -- 1.50 packs/day for 10 years    Types: Cigarettes    Quit date: 07/08/1995  . Smokeless tobacco: Former Neurosurgeon    Quit date: 12/16/1995  . Alcohol Use: Yes  . Drug Use: No  . Sexually Active: Yes    Birth Control/ Protection: None   Other Topics Concern  . 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    Family Status  Relation Status Death Age  . Mother Alive   . Father Alive    Family History  Problem Relation Age of Onset  . Coronary artery disease Father     PPM in his late 56s, CAD Dx 60s  . Atrial fibrillation Mother   . Hyperlipidemia Father      ROS:  Full 14 point review of systems complete and found to be negative unless listed above.  Physical Exam: Blood pressure 140/76, pulse 63, temperature 98.4 F (36.9 C), temperature source Oral, resp. rate 12, SpO2 99.00%.  139/82 146/84 140/76 101        102        94  Lying    Sitting   Standing  General: Well developed, well nourished, male in no acute distress Head: Eyes PERRLA, No xanthomas.   Normocephalic and atraumatic, oropharynx without edema or exudate. Dentition:  Lungs:  Heart: HRRR S1 S2, no rub/gallop, Heart irregular rate and rhythm with S1, S2  murmur. pulses are 2+ extrem.   Neck: No carotid bruits. No lymphadenopathy.  JVD. Abdomen: Bowel sounds present, abdomen soft and non-tender without masses or hernias noted. Msk:  No spine or cva tenderness. No weakness, no joint deformities or effusions. Extremities: No clubbing or cyanosis.  edema.  Neuro: Alert and oriented X 3. No focal deficits noted. Psych:  Good affect, responds appropriately Skin: No rashes or lesions noted.  Labs:   Lab Results  Component Value Date   WBC 5.7 01/28/2013   HGB 13.5 01/28/2013   HCT 37.2* 01/28/2013   MCV 92.3 01/28/2013   PLT 160 01/28/2013   No results found for this  basename: INR,  in the last 72 hours   Recent Labs Lab 01/30/13 0455  NA 142  K 3.8  CL 112  CO2 23  BUN 10  CREATININE 0.99  CALCIUM 8.1*  PROT 6.0  BILITOT 0.2*  ALKPHOS 74  ALT 9  AST 11  GLUCOSE 79    Recent Labs  01/28/13 1300  TROPONINI <0.30   Echo: 01/28/2013 Study Conclusions - Left ventricle: The cavity size was normal. Wall thickness was top normal. Systolic function was normal. The estimated  ejection fraction was in the range of 55% to 60%. Wall motion was normal; there were no regional wall motion abnormalities. Left ventricular diastolic function parameters were normal. - Left atrium: The atrium was normal in size. - Pulmonic valve: Trivial regurgitation. - Pulmonary arteries: PA peak pressure: 32mm Hg (S). - Systemic veins: The IVC was not visualized.  ECG:   SR, S brady at times, no acute changes  Radiology:  Ct Angio Chest Pe W/cm &/or Wo Cm 01/29/2013   *RADIOLOGY REPORT*  Clinical Data: Elevated D-dimer.  Chest pain.  CT ANGIOGRAPHY CHEST  Technique:  Multidetector CT imaging of the chest using the standard protocol during bolus administration of intravenous contrast. Multiplanar reconstructed images including MIPs were obtained and reviewed to evaluate the vascular anatomy.  Contrast: 80mL OMNIPAQUE IOHEXOL 350 MG/ML SOLN  Comparison: None.  Findings: There are no filling defects in the pulmonary arterial tree to suggest acute pulmonary thromboembolism.  No abnormal mediastinal adenopathy.  Prominent mediastinal fat is present.  Minimal coronary artery calcification in the proximal left anterior descending.  No pneumothorax.  No pleural effusion.  Dependent atelectasis at the lung bases.  A spinal cord stimulator is in the posterior epidural space at the T7 and T8 level.  Lower thoracic laminotomy decompression has been performed.  IMPRESSION: No evidence of acute pulmonary thromboembolism.  Postoperative changes.   Original Report Authenticated By: Jolaine Click, M.D.    ASSESSMENT AND PLAN:   The patient was seen today by Dr Patty Sermons, the patient evaluated and the data reviewed.  Active Problems:   Chest pain   Seizures   Syncope   Hypokalemia   Positive D dimer   Signed: Theodore Demark, PA-C 01/31/2013 10:23 AM Beeper [phone]  Co-Sign MD Seen on 2900 with Theodore Demark, PA-C.  Difficult diagnostic problem. He is having burning left chest pain at the present time. There is jaw pain and numbness. He had two episodes of unresponsiveness while we were in the room.  His pulse and BP did not change during these episodes.  Family notes that the patient's father had CAD with very atypical presentation which remained enigmatic until cardiac cath was finally done and patient received stents. Cholesterol status unknown although patient states that he always passes his physical exams without comment on cholesterol. Management of his symptoms will depend on establishing an etiology. EEG during episodes of unresponsiveness did not reveal any etiology. There are both neurologic and possible cardiac components present. With ongoing chest discomfort will proceed with cardiac cath now to guide future therapy and management.

## 2013-01-31 NOTE — CV Procedure (Signed)
  Cardiac Catheterization Procedure Note  Name: CHRISTHOPER BUSBEE MRN: [id number] DOB: [birth year]  Procedure: Left Heart Cath, Selective Coronary Angiography, LV angiography  Indication:  Chest pain suggestive of unstable angina.  Procedural details: The right groin was prepped, draped, and anesthetized with 1% lidocaine. Using modified Seldinger technique, a 5 French sheath was introduced into the right femoral artery. Standard Judkins catheters were used for coronary angiography and left ventriculography. Catheter exchanges were performed over a guidewire. There were no immediate procedural complications. The patient was transferred to the post catheterization recovery area for further monitoring.  Procedural Findings:  Hemodynamics:     AO 123/67    LV 129/4   Coronary angiography:   Coronary dominance: Right  Left mainstem:   Distal left main calcification.    Left anterior descending (LAD):  Mild proximal calcification with long proximal 30% stenosis.   Mild proximal luminal irregularities.  Mid diagonal moderate sized and normal.    Left circumflex (LCx):  AV groove normal.  RI moderate sized and normal.  MOM 1 small and normal.  MOM 2 moderate sized and normal.  PL moderate sized and normal.   Right coronary artery (RCA):  Dominant and large.  Normal.  Large PDA normal.   Left ventriculography: Left ventricular systolic function is normal, LVEF is estimated at 65%, there is no significant mitral regurgitation   Final Conclusions:   Mild coronary calcification and plaque.  NL LV function.  Recommendations:   Medical management  Rollene Rotunda 01/31/2013, 11:32 AM

## 2013-01-31 NOTE — Interval H&P Note (Signed)
History and Physical Interval Note:  01/31/2013 11:04 AM  Jeff [name]  has presented today for surgery, with the diagnosis of urgent  The various methods of treatment have been discussed with the patient and family. After consideration of risks, benefits and other options for treatment, the patient has consented to  Procedure(s): LEFT HEART CATHETERIZATION WITH CORONARY ANGIOGRAM (N/A) as a surgical intervention .  The patient's history has been reviewed, patient examined, no change in status, stable for surgery.  I have reviewed the patient's chart and labs.  Questions were answered to the patient's satisfaction.     Rollene Rotunda

## 2013-01-31 NOTE — Progress Notes (Signed)
Patient is experiencing the same type of episode at this time. Dr Butler Denmark aware and at bedside. VS reamin stable, patient symptoms same as before (see prior note at 0845).  Dawson Bills, RN

## 2013-01-31 NOTE — Progress Notes (Signed)
[facility] here to transfer patient to [facility] room A560. VSS; family at bedside.  Dawson Bills, RN

## 2013-01-31 NOTE — Progress Notes (Signed)
Patient had another "episode" where he fell backwards in bed and was non responsive to verbal cues and painful stimuli (pinched ear). Eye movement noted with eyes closed. Patient head flaccid; pt helped movement with legs. Wife, daughter, and Revonda Standard, [place] at bedside. VS remain stable and unchanged during this episode.   Dawson Bills, RN

## 2013-03-17 ENCOUNTER — Ambulatory Visit (INDEPENDENT_AMBULATORY_CARE_PROVIDER_SITE_OTHER): Payer: 59 | Admitting: Cardiovascular Disease

## 2013-03-17 ENCOUNTER — Encounter: Payer: Self-pay | Admitting: Cardiovascular Disease

## 2013-03-17 VITALS — BP 104/70 | HR 58 | Ht 68.0 in | Wt 202.5 lb

## 2013-03-17 DIAGNOSIS — R55 Syncope and collapse: Secondary | ICD-10-CM

## 2013-03-17 DIAGNOSIS — R200 Anesthesia of skin: Secondary | ICD-10-CM

## 2013-03-17 DIAGNOSIS — I10 Essential (primary) hypertension: Secondary | ICD-10-CM

## 2013-03-17 DIAGNOSIS — R079 Chest pain, unspecified: Secondary | ICD-10-CM

## 2013-03-17 DIAGNOSIS — R5383 Other fatigue: Secondary | ICD-10-CM | POA: Insufficient documentation

## 2013-03-17 DIAGNOSIS — R209 Unspecified disturbances of skin sensation: Secondary | ICD-10-CM

## 2013-03-17 DIAGNOSIS — I739 Peripheral vascular disease, unspecified: Secondary | ICD-10-CM

## 2013-03-17 DIAGNOSIS — R5381 Other malaise: Secondary | ICD-10-CM

## 2013-03-17 NOTE — Patient Instructions (Addendum)
You are doing well. Please hold metoprolol Please cut the valsartan HCTZ in 1/2 daily  Monitor your blood pressure It it runs low, call the office  Please call us if you have new issues that need to be addressed before your next appt.

## 2013-03-17 NOTE — Assessment & Plan Note (Signed)
He denies any further episodes of chest pain. Recent cardiac catheterization showing minimal disease.

## 2013-03-17 NOTE — Addendum Note (Signed)
Addended by: Antonieta Iba on: 03/17/2013 06:07 PM   Modules accepted: Level of Service

## 2013-03-17 NOTE — Assessment & Plan Note (Signed)
He is bradycardic today, blood pressure very low. We have suggested he hold his metoprolol, cut his valsartan and HCTZ in half.

## 2013-03-17 NOTE — Assessment & Plan Note (Signed)
Etiology of his syncopal episodes unclear, no further episodes after Topamax was held. He had significant neurologic workup, EEG x2.

## 2013-03-17 NOTE — Progress Notes (Signed)
Patient ID: Jeff [name], male    DOB: 1960-09-12, 52 y.o.   MRN: [id number]  HPI Comments: Jeff [name] is a 52 y.o. male who presented to the ED recently with 1 day history of intermittent episodes of chest pain and syncope. Workup at [facility] included a cardiac cath, significant neurologic testing including EEG, transferred to [facility] for repeat EEG.  In the am, he was told that his symptoms could have been secondary to Topamax and this was held. Since then he has felt well with no further symptoms.   Initially presented by EMS with chest pains, left arm pain, syncope, bradycardia, "numbness" in his lower jaw.  While having syncope, vitals were essentially stable including normal blood pressure. Normal sinus rhythm noted . He continued to have episodes in the hospital .  He had cardiac catheterization showing minimal disease of the LAD. Additional workup was also negative. No seizures-type activity seen on EEG despite having episodes while being monitored.  In followup today, he reports that he is doing well. He does have significant fatigue. Blood pressure has been running very low. Metoprolol was recently decreased down to 25 mg daily. He reports that previously, over 10 years ago, his blood pressure was elevated. This has not been an issue recently. 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  EKG shows normal sinus rhythm with rate 58 beats per minute, no significant ST or T wave changes Recent lab work shows hemoglobin A1c in the 5 range, total cholesterol 170 Blood pressure was very low when he saw Dr. Sullivan Lone, systolic of 100   Outpatient Encounter Prescriptions as of 03/17/2013  Medication Sig Dispense Refill  . aspirin 81 MG tablet Take 81 mg by mouth daily.      . metoprolol tartrate (LOPRESSOR) 25 MG tablet Take 25 mg by mouth daily.      . Oxycodone HCl 10 MG TABS Take 10 mg by mouth 4 (four) times daily as needed (for pain).       Marland Kitchen oxymorphone (OPANA ER) 30 MG 12 hr tablet Take 30 mg by mouth every 12 (twelve) hours.      . potassium chloride (K-DUR) 10 MEQ tablet Take 10 mEq by mouth 2 (two) times daily.      . valsartan-hydrochlorothiazide (DIOVAN-HCT) 160-25 MG per tablet Take 1 tablet by mouth daily.        Review of Systems  Constitutional: Positive for fatigue.  HENT: Negative.   Eyes: Negative.   Respiratory: Negative.   Cardiovascular: Negative.   Gastrointestinal: Negative.   Musculoskeletal: Negative.   Skin: Negative.   Neurological: Negative.   Psychiatric/Behavioral: Negative.   All other systems reviewed and are negative.    BP 104/70  Pulse 58  Ht 5\' 8"  (1.727 m)  Wt 202 lb 8 oz (91.853 kg)  BMI 30.8 kg/m2 Blood pressure was low again on my check with systolic pressure around 100 Physical Exam  Nursing note and vitals reviewed. Constitutional: He is oriented to person, place, and time. He appears well-developed and well-nourished.  HENT:  Head: Normocephalic.  Nose: Nose normal.  Mouth/Throat: Oropharynx is clear and moist.  Eyes: Conjunctivae are normal. Pupils are equal, round, and reactive to light.  Neck: Normal range of motion. Neck supple. No JVD present.  Cardiovascular: Normal rate, regular rhythm, S1 normal, S2 normal, normal heart sounds and intact distal pulses.  Exam reveals no gallop and no friction rub.   No murmur heard. Pulmonary/Chest:  Effort normal and breath sounds normal. No respiratory distress. He has no wheezes. He has no rales. He exhibits no tenderness.  Abdominal: Soft. Bowel sounds are normal. He exhibits no distension. There is no tenderness.  Musculoskeletal: Normal range of motion. He exhibits no edema and no tenderness.  Lymphadenopathy:    He has no cervical adenopathy.  Neurological: He is alert and oriented to person, place, and time. Coordination normal.  Skin: Skin is warm and dry. No rash noted. No erythema.  Psychiatric: He has a normal mood  and affect. His behavior is normal. Judgment and thought content normal.      Assessment and Plan

## 2013-03-26 ENCOUNTER — Encounter: Payer: Self-pay | Admitting: Cardiovascular Disease

## 2013-04-20 ENCOUNTER — Ambulatory Visit: Payer: Self-pay | Admitting: Gastroenterology

## 2013-04-20 LAB — HM COLONOSCOPY

## 2013-06-08 ENCOUNTER — Other Ambulatory Visit: Payer: Self-pay

## 2013-09-26 DIAGNOSIS — Z86018 Personal history of other benign neoplasm: Secondary | ICD-10-CM

## 2013-09-26 HISTORY — DX: Personal history of other benign neoplasm: Z86.018

## 2013-10-04 DIAGNOSIS — G8929 Other chronic pain: Secondary | ICD-10-CM | POA: Insufficient documentation

## 2013-10-04 DIAGNOSIS — M961 Postlaminectomy syndrome, not elsewhere classified: Secondary | ICD-10-CM | POA: Insufficient documentation

## 2013-10-04 DIAGNOSIS — M542 Cervicalgia: Secondary | ICD-10-CM

## 2013-10-04 DIAGNOSIS — M792 Neuralgia and neuritis, unspecified: Secondary | ICD-10-CM | POA: Insufficient documentation

## 2014-07-12 ENCOUNTER — Encounter (HOSPITAL_COMMUNITY): Payer: Self-pay | Admitting: Cardiology

## 2014-09-19 LAB — BASIC METABOLIC PANEL
BUN: 12 mg/dL (ref 4–21)
CREATININE: 0.9 mg/dL (ref 0.6–1.3)
Glucose: 135 mg/dL
POTASSIUM: 4.7 mmol/L (ref 3.4–5.3)
SODIUM: 141 mmol/L (ref 137–147)

## 2014-09-19 LAB — CBC AND DIFFERENTIAL
HCT: 48 % (ref 41–53)
Hemoglobin: 16.5 g/dL (ref 13.5–17.5)
Neutrophils Absolute: 55 /uL
Platelets: 249 10*3/uL (ref 150–399)
WBC: 7.2 10^3/mL

## 2014-09-19 LAB — HEMOGLOBIN A1C: HEMOGLOBIN A1C: 6.4 % — AB (ref 4.0–6.0)

## 2014-09-19 LAB — TSH: TSH: 1.77 u[IU]/mL (ref 0.41–5.90)

## 2014-09-19 LAB — PSA: PSA: 0.9

## 2014-10-10 LAB — LIPID PANEL
CHOLESTEROL: 122 mg/dL (ref 0–200)
HDL: 27 mg/dL — AB (ref 35–70)
LDL Cholesterol: 66 mg/dL
LDl/HDL Ratio: 2.4
TRIGLYCERIDES: 147 mg/dL (ref 40–160)

## 2014-10-10 LAB — HEPATIC FUNCTION PANEL
ALT: 23 U/L (ref 10–40)
AST: 23 U/L (ref 14–40)
Alkaline Phosphatase: 91 U/L (ref 25–125)
BILIRUBIN, TOTAL: 0.6 mg/dL
Bilirubin, Direct: 0.15 mg/dL (ref 0.01–0.4)

## 2014-11-23 NOTE — Op Note (Signed)
PATIENT NAME:  Jeff [name], Jeff [name] MR#:  [id number] DATE OF BIRTH:  09-Mar-1961  DATE OF PROCEDURE:  01/10/2013  PREOPERATIVE DIAGNOSIS: Visually significant cataract of the right eye.   POSTOPERATIVE DIAGNOSIS: Visually significant cataract of the right eye.   OPERATIVE PROCEDURE: Cataract extraction by phacoemulsification with implant of intraocular lens to the right eye.   SURGEON: Birder Robson, MD  ANESTHESIA:  1. Managed anesthesia care.  2. 50-50 mixture of 0.75% bupivacaine and 4% Xylocaine given as a retrobulbar block.   COMPLICATIONS: None.   TECHNIQUE:  Stop and chop.  DESCRIPTION OF PROCEDURE: The patient was examined and consented for this procedure in the preoperative holding area and then brought back to the Operating Room where the anesthesia team employed managed anesthesia care.  3.5 milliliters of the aforementioned mixture were placed in the right orbit on an Atkinson needle without complication. The right eye was then prepped and draped in the usual sterile ophthalmic fashion. A lid speculum was placed. The side-port blade was used to create a paracentesis and the anterior chamber was filled with viscoelastic. The keratome was used to create a near clear corneal incision. The continuous curvilinear capsulorrhexis was performed with a cystotome followed by the capsulorrhexis forceps. Hydrodissection and hydrodelineation were carried out with BSS on a blunt cannula. The lens was removed in a stop and chop technique. The remaining cortical material was removed with the irrigation-aspiration handpiece. The capsular bag was inflated with viscoelastic and the Technis ZCBOO 21.0-diopter lens, serial number [id number], was placed in the capsular bag without complication. The remaining viscoelastic was removed from the eye with the irrigation-aspiration handpiece. The wounds were hydrated. The anterior chamber was flushed with Miostat and the eye was inflated to a physiologic pressure.  0.1 mL of cefuroxime concentration 10 mg/mL was placed in the anterior chamber. The wounds were found to be water tight. The eye was dressed with Vigamox followed by Maxitrol ointment and a protective shield was placed. The patient will follow up with me in one day.    ____________________________ Livingston Diones. Zabria Liss, MD wlp:cb D: 01/10/2013 16:36:00 ET T: 01/10/2013 18:00:26 ET JOB#: [id number]  cc: Jermany Rimel L. Keyundra Fant, MD, <Dictator> Livingston Diones Daylyn Azbill MD ELECTRONICALLY SIGNED 01/11/2013 12:50

## 2014-12-06 ENCOUNTER — Encounter: Payer: Self-pay | Admitting: Emergency Medicine

## 2014-12-06 DIAGNOSIS — I739 Peripheral vascular disease, unspecified: Secondary | ICD-10-CM | POA: Insufficient documentation

## 2014-12-06 DIAGNOSIS — M5010 Cervical disc disorder with radiculopathy, unspecified cervical region: Secondary | ICD-10-CM | POA: Insufficient documentation

## 2014-12-06 DIAGNOSIS — R51 Headache: Secondary | ICD-10-CM

## 2014-12-06 DIAGNOSIS — E785 Hyperlipidemia, unspecified: Secondary | ICD-10-CM | POA: Insufficient documentation

## 2014-12-06 DIAGNOSIS — M5417 Radiculopathy, lumbosacral region: Secondary | ICD-10-CM | POA: Insufficient documentation

## 2014-12-06 DIAGNOSIS — F329 Major depressive disorder, single episode, unspecified: Secondary | ICD-10-CM | POA: Insufficient documentation

## 2014-12-06 DIAGNOSIS — E119 Type 2 diabetes mellitus without complications: Secondary | ICD-10-CM | POA: Insufficient documentation

## 2014-12-06 DIAGNOSIS — F320 Major depressive disorder, single episode, mild: Secondary | ICD-10-CM | POA: Insufficient documentation

## 2014-12-06 DIAGNOSIS — N289 Disorder of kidney and ureter, unspecified: Secondary | ICD-10-CM | POA: Insufficient documentation

## 2014-12-06 DIAGNOSIS — F32A Depression, unspecified: Secondary | ICD-10-CM | POA: Insufficient documentation

## 2014-12-06 DIAGNOSIS — M5030 Other cervical disc degeneration, unspecified cervical region: Secondary | ICD-10-CM | POA: Insufficient documentation

## 2014-12-06 DIAGNOSIS — R519 Headache, unspecified: Secondary | ICD-10-CM | POA: Insufficient documentation

## 2014-12-06 DIAGNOSIS — M5126 Other intervertebral disc displacement, lumbar region: Secondary | ICD-10-CM | POA: Insufficient documentation

## 2014-12-06 DIAGNOSIS — G629 Polyneuropathy, unspecified: Secondary | ICD-10-CM | POA: Insufficient documentation

## 2014-12-06 DIAGNOSIS — A6920 Lyme disease, unspecified: Secondary | ICD-10-CM | POA: Insufficient documentation

## 2014-12-06 DIAGNOSIS — I10 Essential (primary) hypertension: Secondary | ICD-10-CM | POA: Insufficient documentation

## 2014-12-06 DIAGNOSIS — M5412 Radiculopathy, cervical region: Secondary | ICD-10-CM | POA: Insufficient documentation

## 2014-12-06 DIAGNOSIS — E291 Testicular hypofunction: Secondary | ICD-10-CM | POA: Insufficient documentation

## 2014-12-06 DIAGNOSIS — E669 Obesity, unspecified: Secondary | ICD-10-CM | POA: Insufficient documentation

## 2014-12-06 DIAGNOSIS — K635 Polyp of colon: Secondary | ICD-10-CM | POA: Insufficient documentation

## 2014-12-06 DIAGNOSIS — M5020 Other cervical disc displacement, unspecified cervical region: Secondary | ICD-10-CM | POA: Insufficient documentation

## 2015-01-15 ENCOUNTER — Encounter: Payer: Self-pay | Admitting: Family Medicine

## 2015-01-15 ENCOUNTER — Ambulatory Visit (INDEPENDENT_AMBULATORY_CARE_PROVIDER_SITE_OTHER): Payer: 59 | Admitting: Family Medicine

## 2015-01-15 VITALS — BP 162/84 | HR 74 | Temp 98.0°F | Resp 16 | Wt 231.0 lb

## 2015-01-15 DIAGNOSIS — Z125 Encounter for screening for malignant neoplasm of prostate: Secondary | ICD-10-CM

## 2015-01-15 DIAGNOSIS — I10 Essential (primary) hypertension: Secondary | ICD-10-CM

## 2015-01-15 DIAGNOSIS — Z0000 Encounter for general adult medical examination without abnormal findings: Secondary | ICD-10-CM | POA: Diagnosis not present

## 2015-01-15 LAB — POCT URINALYSIS DIPSTICK
Bilirubin, UA: NEGATIVE
Ketones, UA: NEGATIVE
Leukocytes, UA: NEGATIVE
Nitrite, UA: NEGATIVE
PH UA: 6.5
PROTEIN UA: NEGATIVE
RBC UA: NEGATIVE
UROBILINOGEN UA: NEGATIVE

## 2015-01-15 LAB — IFOBT (OCCULT BLOOD): IMMUNOLOGICAL FECAL OCCULT BLOOD TEST: NEGATIVE

## 2015-01-15 MED ORDER — LOSARTAN POTASSIUM 100 MG PO TABS: 100.0000 mg | ORAL_TABLET | Freq: Every day | ORAL | Status: DC

## 2015-01-15 NOTE — Progress Notes (Signed)
Patient ID: Jeff [name], male   DOB: 1960-10-14, 54 y.o.   MRN: [id number] Patient: Jeff [name], Male    DOB: November 28, 1960, 54 y.o.   MRN: [id number] Visit Date: 01/15/2015  Today's Provider: [name], MD   Chief Complaint  Patient presents with  . Annual Exam   Subjective:  Jeff [name] is a 54 y.o. male who presents today for health maintenance and complete physical. He feels well. He reports exercising none. He reports he is sleeping poorly.   Review of Systems  Constitutional: Positive for fatigue.  HENT: Positive for sinus pressure and tinnitus.   Eyes: Negative.   Respiratory: Positive for apnea.   Cardiovascular: Negative.   Gastrointestinal: Negative.   Endocrine: Negative.   Genitourinary: Negative.   Musculoskeletal: Positive for back pain and neck pain.  Skin: Negative.   Allergic/Immunologic: Negative.   Neurological: Positive for headaches.  Hematological: Negative.   Psychiatric/Behavioral: Positive for dysphoric mood and agitation.    History   Social History  . Marital Status: Married    Spouse Name: N/A  . Number of Children: N/A  . Years of Education: N/A   Occupational History  . works at [employer]   . [place] History Main Topics  . Smoking status: Former Smoker -- 1.50 packs/day for 30 years    Types: Cigarettes    Quit date: 07/08/1995  . Smokeless tobacco: Former Systems developer    Quit date: 12/16/1995  . Alcohol Use: Yes  . Drug Use: No  . Sexual Activity: Yes    Birth Control/ Protection: None   Other Topics Concern  . 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    Patient Active Problem List   Diagnosis Date Noted  . Abnormal kidney function 12/06/2014  . Cervical nerve root disorder 12/06/2014  . Colon polyp 12/06/2014  . Clinical depression 12/06/2014  . Essential (primary) hypertension 12/06/2014  . Cephalalgia 12/06/2014  .  Bulge of cervical disc without myelopathy 12/06/2014  . HLD (hyperlipidemia) 12/06/2014  . 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 Displacement of lumbar intervertebral disc without myelopathy 12/06/2014  . L-S radiculopathy 12/06/2014  . Lyme disease 12/06/2014  . Mild major depression 12/06/2014  . Neuropathy 12/06/2014  . Adiposity 12/06/2014  . Peripheral vascular disease 12/06/2014  . Diabetes mellitus, type 2 12/06/2014  . Fatigue 03/17/2013  . Hypokalemia 01/30/2013  . Positive D dimer 01/30/2013  . Chest pain 01/28/2013  . Seizures 01/28/2013  . Syncope 01/28/2013  . Thoracic spinal stenosis 06/22/2011    Past Surgical History  Procedure Laterality Date  . Cervical fusion    . Carpal tunnel release    . Knee arthroscopy    . Lumbar laminectomy/decompression microdiscectomy  06/22/2011    Procedure: LUMBAR LAMINECTOMY/DECOMPRESSION MICRODISCECTOMY;  Surgeon: Ophelia Charter;  Location: [facility];  Service: Neurosurgery;  Laterality: N/A;  Thoracic Ten-Eleven,Thoracic Eleven-Twelve Laminectomy  . Cardiac catheterization  01/31/2013    Medical management  . Left heart catheterization with coronary angiogram N/A 01/31/2013    Procedure: LEFT HEART CATHETERIZATION WITH CORONARY ANGIOGRAM;  Surgeon: Minus Breeding, MD;  Location: [facility] CATH [facility];  Service: Cardiovascular;  Laterality: N/A;  . Vasectomy  1991    His family history includes Atrial fibrillation in his mother; Cancer in his maternal grandmother and maternal uncle; Coronary artery disease in his father; Diabetes in his father; Heart disease in his father; Hyperlipidemia  in his father; Hypertension in his father and mother; Migraines in his daughter; Transient ischemic attack in his mother.    Outpatient Prescriptions Prior to Visit  Medication Sig Dispense Refill  . aspirin 81 MG tablet Take 81 mg by mouth daily.    Marland Kitchen aspirin 81 MG tablet Take by mouth.    Marland Kitchen atorvastatin (LIPITOR) 10 MG tablet Take by mouth.    . carisoprodol  (SOMA) 350 MG tablet Take by mouth.    . losartan (COZAAR) 50 MG tablet Take by mouth.    . Melatonin CR 3 MG TBCR Take by mouth.    . metoprolol tartrate (LOPRESSOR) 25 MG tablet Take 25 mg by mouth daily.    . Oxycodone HCl 10 MG TABS Take 10 mg by mouth 4 (four) times daily as needed (for pain).    . Oxycodone HCl 10 MG TABS Take by mouth.    Marland Kitchen oxymorphone (OPANA ER) 30 MG 12 hr tablet Take 30 mg by mouth every 12 (twelve) hours.    . Oxymorphone HCl, Crush Resist, 20 MG T12A Take by mouth.    . potassium chloride (K-DUR) 10 MEQ tablet Take 10 mEq by mouth 2 (two) times daily.    . potassium chloride SA (K-DUR,KLOR-CON) 20 MEQ tablet Take by mouth.    . pregabalin (LYRICA) 75 MG capsule Take by mouth.    . valsartan-hydrochlorothiazide (DIOVAN-HCT) 160-25 MG per tablet Take 1 tablet by mouth daily.    . Venlafaxine HCl 150 MG TB24 Take by mouth.     No facility-administered medications prior to visit.    Patient Care Team: Jerrol Banana., MD as PCP - General (Family Medicine)     Objective:   Vitals: There were no vitals filed for this visit.  Physical Exam  Constitutional: He is oriented to person, place, and time. He appears well-developed and well-nourished.  HENT:  Head: Normocephalic and atraumatic.  Right Ear: External ear normal.  Left Ear: External ear normal.  Nose: Nose normal.  Mouth/Throat: Oropharynx is clear and moist.  Eyes: Conjunctivae and EOM are normal. Pupils are equal, round, and reactive to light.  Neck: Normal range of motion. Neck supple.  Cardiovascular: Normal rate, regular rhythm, normal heart sounds and intact distal pulses.   Pulmonary/Chest: Effort normal and breath sounds normal.  Abdominal: Soft. Bowel sounds are normal.  Genitourinary: Rectum normal, prostate normal and penis normal.  Musculoskeletal: Normal range of motion.  Neurological: He is alert and oriented to person, place, and time.  Skin: Skin is warm and dry.   Psychiatric: He has a normal mood and affect. His behavior is normal. Judgment and thought content normal.     Depression Screen No flowsheet data found.  PHQ9 14   Assessment & Plan:     Routine Health Maintenance and Physical Exam  Exercise Activities and Dietary recommendations Goals    None      Immunization History  Administered Date(s) Administered  . Pneumococcal Polysaccharide-23 01/21/2012  . Tdap 01/21/2012    Health Maintenance  Topic Date Due  . FOOT EXAM  01/16/1969  . OPHTHALMOLOGY EXAM  01/16/1969  . URINE MICROALBUMIN  01/16/1969  . HIV Screening  01/16/1974  . COLONOSCOPY  01/16/2009  . INFLUENZA VACCINE  03/04/2015  . HEMOGLOBIN A1C  03/20/2015  . PNEUMOCOCCAL POLYSACCHARIDE VACCINE (2) 01/20/2017  . TETANUS/TDAP  01/20/2022      Discussed health benefits of physical activity, and encouraged him to engage in regular exercise appropriate for  his age and condition.   Chronic pain He will explore all options with the pain clinic. Major depressive disorder-moderate Consider change on next visit. Max dose of venlafaxine already ------------------------------------------------------------------------------------------------------------

## 2015-01-22 ENCOUNTER — Telehealth: Payer: Self-pay

## 2015-01-22 LAB — TSH: TSH: 0.985 u[IU]/mL (ref 0.450–4.500)

## 2015-01-22 LAB — CMP14+EGFR
ALBUMIN: 4.2 g/dL (ref 3.5–5.5)
ALT: 22 IU/L (ref 0–44)
AST: 16 IU/L (ref 0–40)
Albumin/Globulin Ratio: 1.4 (ref 1.1–2.5)
Alkaline Phosphatase: 120 IU/L — ABNORMAL HIGH (ref 39–117)
BUN/Creatinine Ratio: 13 (ref 9–20)
BUN: 13 mg/dL (ref 6–24)
Bilirubin Total: 0.7 mg/dL (ref 0.0–1.2)
CALCIUM: 8.9 mg/dL (ref 8.7–10.2)
CHLORIDE: 98 mmol/L (ref 97–108)
CO2: 26 mmol/L (ref 18–29)
CREATININE: 0.99 mg/dL (ref 0.76–1.27)
GFR, EST AFRICAN AMERICAN: 98 mL/min/{1.73_m2} (ref >59)
GFR, EST NON AFRICAN AMERICAN: 85 mL/min/{1.73_m2} (ref >59)
GLOBULIN, TOTAL: 3.1 g/dL (ref 1.5–4.5)
GLUCOSE: 261 mg/dL — AB (ref 65–99)
Potassium: 4.5 mmol/L (ref 3.5–5.2)
Sodium: 139 mmol/L (ref 134–144)
TOTAL PROTEIN: 7.3 g/dL (ref 6.0–8.5)

## 2015-01-22 LAB — LIPID PANEL WITH LDL/HDL RATIO
CHOLESTEROL TOTAL: 133 mg/dL (ref 100–199)
HDL: 29 mg/dL — ABNORMAL LOW (ref >39)
LDL CALC: 77 mg/dL (ref 0–99)
LDl/HDL Ratio: 2.7 ratio units (ref 0.0–3.6)
TRIGLYCERIDES: 136 mg/dL (ref 0–149)
VLDL Cholesterol Cal: 27 mg/dL (ref 5–40)

## 2015-01-22 LAB — PSA: PROSTATE SPECIFIC AG, SERUM: 1.5 ng/mL (ref 0.0–4.0)

## 2015-01-22 NOTE — Telephone Encounter (Signed)
Check if patient has been informed

## 2015-01-22 NOTE — Telephone Encounter (Signed)
Called to see if he had been informed of his lab results.  ED

## 2015-01-23 ENCOUNTER — Telehealth: Payer: Self-pay | Admitting: Family Medicine

## 2015-01-23 NOTE — Telephone Encounter (Signed)
Advised  ED 

## 2015-01-23 NOTE — Telephone Encounter (Signed)
-----   Message from Jerrol Banana., MD sent at 01/23/2015  8:42 AM EDT ----- Labs stable except for blood sugar creeping up. Diet and exercise to control this for now.

## 2015-01-23 NOTE — Telephone Encounter (Signed)
LMTCB ED 

## 2015-01-23 NOTE — Telephone Encounter (Signed)
Pt returning call to [place].  CN#[phone]/[phone]

## 2015-01-24 LAB — CBC WITH DIFFERENTIAL/PLATELET

## 2015-01-28 ENCOUNTER — Other Ambulatory Visit: Payer: Self-pay

## 2015-02-11 ENCOUNTER — Ambulatory Visit (INDEPENDENT_AMBULATORY_CARE_PROVIDER_SITE_OTHER): Payer: 59 | Admitting: Family Medicine

## 2015-02-11 ENCOUNTER — Encounter: Payer: Self-pay | Admitting: Family Medicine

## 2015-02-11 VITALS — BP 132/84 | HR 78 | Temp 98.8°F | Resp 16 | Wt 231.6 lb

## 2015-02-11 DIAGNOSIS — G4730 Sleep apnea, unspecified: Secondary | ICD-10-CM

## 2015-02-11 DIAGNOSIS — H6092 Unspecified otitis externa, left ear: Secondary | ICD-10-CM | POA: Diagnosis not present

## 2015-02-11 MED ORDER — PRAMOXINE-HC-CHLOROXYLENOL AQ 10-10-1 MG/ML OT SOLN: 3.0000 [drp] | Freq: Four times a day (QID) | OTIC | Status: DC

## 2015-02-11 NOTE — Progress Notes (Signed)
Patient ID: Jeff [name], male   DOB: 04-01-1961, 54 y.o.   MRN: [id number]    Subjective:  HPI Patient comes in today for several issues. He was told that sleep center he needs a prescription for CPAP mask and supplies but also for a new machine. Patient says the old machine is working fine. I am happy to write him for a new machine and will do an auto titrating machine but told him about sure insurance will cover this. He has actually probably actually met deductibles am happy to write this for him. Some people like to do feel better with the auto titrating machine. Patient did fall out of the back of his truck 3 days ago. He landed on his back and on asphalt top with gravel. Luckily he has a lot of soreness but no real injury. He is especially sore in the back and the right paracervical area/trapezius. There was no direct trauma to his head or neck. He also is frustrated that he is having significant pain issues. Last week the pain clinic stopped his Opana and switched him to hydromorphone. His pain is actually worse. Lastly he describes mild tenderness and discharge from the left ear. He  says that he cleans his ears out a lot. No fever or hearing loss. He denies swimming in any body of water.  Prior to Admission medications   Medication Sig Start Date End Date Taking? Authorizing Provider  aspirin 81 MG tablet Take 81 mg by mouth daily.   Yes Historical Provider, MD  atorvastatin (LIPITOR) 10 MG tablet Take by mouth. 09/24/14  Yes Historical Provider, MD  carisoprodol (SOMA) 350 MG tablet Take by mouth. 07/17/14  Yes Historical Provider, MD  HYDROmorphone HCl (EXALGO) 8 MG T24A SR tablet Take one tab PO daily 02/09/15  Yes Historical Provider, MD  losartan (COZAAR) 100 MG tablet Take 1 tablet (100 mg total) by mouth daily. 01/15/15  Yes Kiowa Hollar Maceo Pro., MD  LYRICA 100 MG capsule Take 100 mg by mouth 3 (three) times daily. 02/05/15  Yes Historical Provider, MD  Melatonin CR 3 MG TBCR Take  by mouth.   Yes Historical Provider, MD  Oxycodone HCl 10 MG TABS Take by mouth. 09/03/11  Yes Historical Provider, MD  Oxymorphone HCl, Crush Resist, 20 MG T12A Take by mouth. 01/21/12  Yes Historical Provider, MD  potassium chloride SA (K-DUR,KLOR-CON) 20 MEQ tablet Take by mouth. 08/27/14  Yes Historical Provider, MD  valsartan-hydrochlorothiazide (DIOVAN-HCT) 160-25 MG per tablet Take 1 tablet by mouth daily.   Yes Historical Provider, MD  Venlafaxine HCl 150 MG TB24 Take by mouth. 05/16/14  Yes Historical Provider, MD  Oxycodone HCl 10 MG TABS Take 10 mg by mouth. 02/22/15 03/24/15  Historical Provider, MD  pregabalin (LYRICA) 75 MG capsule Take by mouth.    Historical Provider, MD  tiaGABine (GABITRIL) 4 MG tablet Take 2 tablets by mouth at bedtime. 11/23/14   Historical Provider, MD    Patient Active Problem List   Diagnosis Date Noted  . Abnormal kidney function 12/06/2014  . Cervical nerve root disorder 12/06/2014  . Colon polyp 12/06/2014  . Clinical depression 12/06/2014  . Essential (primary) hypertension 12/06/2014  . Cephalalgia 12/06/2014  . Bulge of cervical disc without myelopathy 12/06/2014  . HLD (hyperlipidemia) 12/06/2014  . 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 Displacement of lumbar intervertebral disc without myelopathy 12/06/2014  . L-S radiculopathy 12/06/2014  . Lyme disease 12/06/2014  . Mild major depression 12/06/2014  . Neuropathy 12/06/2014  .  Adiposity 12/06/2014  . Peripheral vascular disease 12/06/2014  . Diabetes mellitus, type 2 12/06/2014  . Cervical post-laminectomy syndrome 10/04/2013  . Cervical pain 10/04/2013  . Peripheral neuropathic pain 10/04/2013  . Failed back syndrome of thoracic spine 10/04/2013  . Fatigue 03/17/2013  . Hypokalemia 01/30/2013  . Positive D dimer 01/30/2013  . Chest pain 01/28/2013  . Seizures 01/28/2013  . Syncope 01/28/2013  . Chronic pain associated with significant psychosocial dysfunction 08/17/2012  . Polypharmacy  08/02/2012  . 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 Post laminectomy syndrome 08/02/2012  . Thoracic spinal stenosis 06/22/2011    Past Medical History  Diagnosis Date  . Hypertension   . Tuberculosis     POSITIVE  TB SKIN TEST 1992.6 MTH [place]   . Neuromuscular disorder     "back nerve stimulator"  . Diabetes mellitus without complication   . Hyperlipidemia   . PAD (peripheral artery disease)   . Fatigue   . Hypogonadism male   . Neuropathy     History   Social History  . Marital Status: Married    Spouse Name: N/A  . Number of Children: N/A  . Years of Education: N/A   Occupational History  . works at [employer]   . [place] History Main Topics  . Smoking status: Former Smoker -- 1.50 packs/day for 30 years    Types: Cigarettes    Quit date: 07/08/1995  . Smokeless tobacco: Former Systems developer    Quit date: 12/16/1995  . Alcohol Use: 0.6 oz/week    1 Standard drinks or equivalent per week     Comment: Beer  . Drug Use: No  . Sexual Activity: Yes    Birth Control/ Protection: None   Other Topics Concern  . 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    Allergies  Allergen Reactions  . Cymbalta [Duloxetine Hcl] Other (See Comments)    "jittery" and "loopy" per pt  . Gabapentin Other (See Comments)    caused severe tremor "jittery" and "loopy" per pt.  . Keppra [Levetiracetam] Other (See Comments)    "jittery", and "loopy." per pt.  . Topamax  [Topiramate]     Review of Systems  Constitutional: Negative.   HENT: Positive for ear pain.        Since Thursday, left ear has drained some. And headache 2-3 times a week, I think it coming from my chronic pain" pt stated " and also having a lot of pain on my neck"   Eyes: Positive for blurred vision.       Had my vision checked before coming here, he precribed some glasses.  Gastrointestinal: Negative.   Genitourinary: Negative.     Musculoskeletal: Positive for neck pain.  Skin: Negative.   Neurological: Positive for tremors and headaches.  Endo/Heme/Allergies: Bruises/bleeds easily.  Psychiatric/Behavioral: Negative.     Immunization History  Administered Date(s) Administered  . Pneumococcal Polysaccharide-23 01/21/2012  . Tdap 01/21/2012   Objective:  BP 132/84 mmHg  Pulse 78  Temp(Src) 98.8 F (37.1 C) (Oral)  Resp 16  Wt 231 lb 9.6 oz (105.053 kg)  Physical Exam  Constitutional: He is oriented to person, place, and time and well-developed, well-nourished, and in no distress.  HENT:  Head: Normocephalic and atraumatic.  Right Ear: External ear normal.  Left Ear: External ear normal.  Nose: Nose normal.  Mouth/Throat: Oropharynx is clear and moist.  TMs  bilaterally are normal. Left EAC is completely clean with mild erythema at the opening of the EAC. Certainly no perforations of the left TM.  Cardiovascular: Normal rate, regular rhythm and normal heart sounds.   Pulmonary/Chest: Effort normal and breath sounds normal.  Abdominal: Soft. Bowel sounds are normal.  Musculoskeletal: He exhibits no edema.  Minimal tenderness along the cervical paraspinal muscles in the right trapezius  Neurological: He is alert and oriented to person, place, and time. Gait normal.  Skin: Skin is warm and dry.  Psychiatric: Memory, affect and judgment normal.    Lab Results  Component Value Date   WBC CANCELED 01/21/2015   HGB 16.5 09/19/2014   HCT CANCELED 01/21/2015   PLT 249 09/19/2014   GLUCOSE 261* 01/21/2015   CHOL 133 01/21/2015   TRIG 136 01/21/2015   HDL 29* 01/21/2015   LDLCALC 77 01/21/2015   TSH 0.985 01/21/2015   PSA 1.5 01/21/2015   INR 0.93 06/16/2011   HGBA1C 6.4* 09/19/2014    CMP     Component Value Date/Time   NA 139 01/21/2015 0823   NA 142 01/30/2013 0455   K 4.5 01/21/2015 0823   K 4.1 12/28/2012 1508   CL 98 01/21/2015 0823   CO2 26 01/21/2015 0823   GLUCOSE 261* 01/21/2015  0823   GLUCOSE 79 01/30/2013 0455   BUN 13 01/21/2015 0823   BUN 10 01/30/2013 0455   CREATININE 0.99 01/21/2015 0823   CREATININE 0.9 09/19/2014   CALCIUM 8.9 01/21/2015 0823   PROT 7.3 01/21/2015 0823   PROT 6.0 01/30/2013 0455   ALBUMIN 2.9* 01/30/2013 0455   AST 16 01/21/2015 0823   ALT 22 01/21/2015 0823   ALKPHOS 120* 01/21/2015 0823   BILITOT 0.7 01/21/2015 0823   BILITOT 0.2* 01/30/2013 0455   GFRNONAA 85 01/21/2015 0823   GFRAA 98 01/21/2015 0823    Assessment and Plan :  Otitis externa Treat with Corticosporin otic drops 3-4 times daily until resolved. Known obstructive sleep apnea. Prescription written for auto titrating CPAP machine and for CPAP mask and supplies. Chronic back and neck pain. Presently followed at pain clinic. Patient may want [place] to take care of this in the future and I am now willing to do this. Advised him that I would prefer him not be on any chronic narcotics as his quality of life will probably suffer from that. Hyperlipidemia Hypertension Chronic anxiety and depression. Stable Miguel Aschoff MD [facility] 02/11/2015 8:56 AM

## 2015-02-12 ENCOUNTER — Other Ambulatory Visit: Payer: Self-pay | Admitting: Family Medicine

## 2015-02-28 ENCOUNTER — Telehealth: Payer: Self-pay | Admitting: Emergency Medicine

## 2015-02-28 NOTE — Telephone Encounter (Signed)
erroneous

## 2015-03-08 NOTE — Progress Notes (Signed)
Patient ID: Jeff [name], male   DOB: 1961-04-14, 54 y.o.   MRN: [id number]   Sleep apnea follow up  The following information was missing from his office visit note.  Patient is using his CPAP machine nightly. He tolerates it well. Symptoms are improved/resolved with using CPAP machine.

## 2015-03-10 ENCOUNTER — Other Ambulatory Visit: Payer: Self-pay | Admitting: Family Medicine

## 2015-04-17 ENCOUNTER — Ambulatory Visit: Payer: Managed Care, Other (non HMO) | Admitting: Family Medicine

## 2015-05-14 ENCOUNTER — Encounter: Payer: Self-pay | Admitting: Family Medicine

## 2015-05-14 ENCOUNTER — Ambulatory Visit (INDEPENDENT_AMBULATORY_CARE_PROVIDER_SITE_OTHER): Payer: 59 | Admitting: Family Medicine

## 2015-05-14 ENCOUNTER — Other Ambulatory Visit: Payer: Self-pay | Admitting: Pain Medicine

## 2015-05-14 VITALS — BP 148/82 | HR 80 | Temp 98.4°F | Resp 16 | Wt 223.0 lb

## 2015-05-14 DIAGNOSIS — Z23 Encounter for immunization: Secondary | ICD-10-CM

## 2015-05-14 DIAGNOSIS — I10 Essential (primary) hypertension: Secondary | ICD-10-CM | POA: Diagnosis not present

## 2015-05-14 DIAGNOSIS — M961 Postlaminectomy syndrome, not elsewhere classified: Secondary | ICD-10-CM

## 2015-05-14 DIAGNOSIS — G4733 Obstructive sleep apnea (adult) (pediatric): Secondary | ICD-10-CM

## 2015-05-14 DIAGNOSIS — M545 Low back pain: Secondary | ICD-10-CM

## 2015-05-14 MED ORDER — HYDROCHLOROTHIAZIDE 25 MG PO TABS: 25.0000 mg | ORAL_TABLET | Freq: Every day | ORAL | Status: DC

## 2015-05-14 NOTE — Progress Notes (Signed)
Patient ID: Jeff [name], male   DOB: 1961-02-16, 54 y.o.   MRN: [id number]    Subjective:  HPI  OSA follow up: Patient is here for follow up. CPAP was set up for patient on August 25th. He has been using it every night and has been able to tolerate this fine. With using CPAP in the morning he does not feel as congested in his sinuses, does not get as congested at night time as it is without using CPAP machine, snoring has improved and his sleep pattern at night improved, gets longer sleep without waking up.  Prior to Admission medications   Medication Sig Start Date End Date Taking? Authorizing Provider  aspirin 81 MG tablet Take 81 mg by mouth daily.    Historical Provider, MD  atorvastatin (LIPITOR) 10 MG tablet Take by mouth. 09/24/14   Historical Provider, MD  carisoprodol (SOMA) 350 MG tablet TAKE 1 TABLET BY MOUTH 3 TIMES A DAY AS NEEDED FOR HEADACHES 03/12/15   Jerrol Banana., MD  HYDROmorphone HCl (EXALGO) 8 MG T24A SR tablet Take one tab PO daily 02/09/15   Historical Provider, MD  KLOR-CON M20 20 MEQ tablet TAKE 1 TABLET BY MOUTH ONCE A DAY 02/12/15   Richard Maceo Pro., MD  losartan (COZAAR) 100 MG tablet Take 1 tablet (100 mg total) by mouth daily. 01/15/15   Richard Maceo Pro., MD  LYRICA 100 MG capsule Take 100 mg by mouth 3 (three) times daily. 02/05/15   Historical Provider, MD  Melatonin CR 3 MG TBCR Take by mouth.    Historical Provider, MD  Oxycodone HCl 10 MG TABS Take by mouth. 09/03/11   Historical Provider, MD  Oxymorphone HCl, Crush Resist, 20 MG T12A Take by mouth. 01/21/12   Historical Provider, MD  Pramoxine-HC-Chloroxylenol Aq (CORTANE-B AQUEOUS) 10-10-1 MG/ML SOLN Place 3 drops in ear(s) 4 (four) times daily. 02/11/15   Richard Maceo Pro., MD  pregabalin (LYRICA) 75 MG capsule Take by mouth.    Historical Provider, MD  tiaGABine (GABITRIL) 4 MG tablet Take 2 tablets by mouth at bedtime. 11/23/14   Historical Provider, MD  valsartan-hydrochlorothiazide  (DIOVAN-HCT) 160-25 MG per tablet Take 1 tablet by mouth daily.    Historical Provider, MD  Venlafaxine HCl 150 MG TB24 Take by mouth. 05/16/14   Historical Provider, MD    Patient Active Problem List   Diagnosis Date Noted  . Abnormal kidney function 12/06/2014  . Cervical nerve root disorder 12/06/2014  . Colon polyp 12/06/2014  . Clinical depression 12/06/2014  . Essential (primary) hypertension 12/06/2014  . Cephalalgia 12/06/2014  . Bulge of cervical disc without myelopathy 12/06/2014  . HLD (hyperlipidemia) 12/06/2014  . 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 Displacement of lumbar intervertebral disc without myelopathy 12/06/2014  . L-S radiculopathy 12/06/2014  . Lyme disease 12/06/2014  . Mild major depression ([place]) 12/06/2014  . Neuropathy ([place]) 12/06/2014  . Adiposity 12/06/2014  . Peripheral vascular disease ([place]) 12/06/2014  . Diabetes mellitus, type 2 ([place]) 12/06/2014  . Cervical post-laminectomy syndrome 10/04/2013  . Cervical pain 10/04/2013  . Peripheral neuropathic pain ([place]) 10/04/2013  . Failed back syndrome of thoracic spine 10/04/2013  . Fatigue 03/17/2013  . Hypokalemia 01/30/2013  . Positive D dimer 01/30/2013  . Chest pain 01/28/2013  . Seizures ([place]) 01/28/2013  . Syncope 01/28/2013  . Chronic pain associated with significant psychosocial dysfunction 08/17/2012  . Polypharmacy 08/02/2012  . 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 Post laminectomy syndrome 08/02/2012  . Thoracic  spinal stenosis 06/22/2011    Past Medical History  Diagnosis Date  . Hypertension   . Tuberculosis     POSITIVE  TB SKIN TEST 1992.6 MTH TX   . Neuromuscular disorder ([place])     "back nerve stimulator"  . Diabetes mellitus without complication ([place])   . Hyperlipidemia   . PAD (peripheral artery disease) ([place])   . Fatigue   . Hypogonadism male   . Neuropathy [facility])     Social History   Social History  . Marital Status: Married    Spouse Name: N/A  . Number of Children: N/A    . Years of Education: N/A   Occupational History  . works at [employer]   . [place] History Main Topics  . Smoking status: Former Smoker -- 1.50 packs/day for 30 years    Types: Cigarettes    Quit date: 07/08/1995  . Smokeless tobacco: Former Systems developer    Quit date: 12/16/1995  . Alcohol Use: 0.6 oz/week    1 Standard drinks or equivalent per week     Comment: Beer  . Drug Use: No  . Sexual Activity: Yes    Birth Control/ Protection: None   Other Topics Concern  . 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    Allergies  Allergen Reactions  . Cymbalta [Duloxetine Hcl] Other (See Comments)    "jittery" and "loopy" per pt  . Gabapentin Other (See Comments)    caused severe tremor "jittery" and "loopy" per pt.  . Keppra [Levetiracetam] Other (See Comments)    "jittery", and "loopy." per pt.  . Topamax  [Topiramate]     Review of Systems  Constitutional: Negative.   HENT: Positive for congestion (better).   Respiratory: Negative.   Cardiovascular: Negative.   Gastrointestinal: Negative.   Musculoskeletal: Positive for back pain and neck pain.  Neurological: Negative.   Psychiatric/Behavioral: Negative.     Immunization History  Administered Date(s) Administered  . Pneumococcal Polysaccharide-23 01/21/2012  . Tdap 01/21/2012   Objective:  BP 148/82 mmHg  Pulse 80  Temp(Src) 98.4 F (36.9 C)  Resp 16  Wt 223 lb (101.152 kg)  Physical Exam  Constitutional: He is oriented to person, place, and time and well-developed, well-nourished, and in no distress.  HENT:  Head: Normocephalic and atraumatic.  Right Ear: External ear normal.  Left Ear: External ear normal.  Nose: Nose normal.  Eyes: Conjunctivae are normal.  Neck: Neck supple.  Cardiovascular: Normal rate, regular rhythm and normal heart sounds.   Pulmonary/Chest: Effort normal and breath sounds normal.  Abdominal: Soft.  Bowel sounds are normal.  Neurological: He is alert and oriented to person, place, and time.  Skin: Skin is warm and dry.  Psychiatric: Mood, memory, affect and judgment normal.    Lab Results  Component Value Date   WBC CANCELED 01/21/2015   HGB 16.5 09/19/2014   HCT CANCELED 01/21/2015   PLT 249 09/19/2014   GLUCOSE 261* 01/21/2015   CHOL 133 01/21/2015   TRIG 136 01/21/2015   HDL 29* 01/21/2015   LDLCALC 77 01/21/2015   TSH 0.985 01/21/2015   PSA 1.5 01/21/2015   INR 0.93 06/16/2011   HGBA1C 6.4* 09/19/2014    CMP     Component Value Date/Time   NA 139 01/21/2015 0823   NA 142 01/30/2013 0455   K 4.5 01/21/2015 0823   K 4.1 12/28/2012 1508  CL 98 01/21/2015 0823   CO2 26 01/21/2015 0823   GLUCOSE 261* 01/21/2015 0823   GLUCOSE 79 01/30/2013 0455   BUN 13 01/21/2015 0823   BUN 10 01/30/2013 0455   CREATININE 0.99 01/21/2015 0823   CREATININE 0.9 09/19/2014   CALCIUM 8.9 01/21/2015 0823   PROT 7.3 01/21/2015 0823   PROT 6.0 01/30/2013 0455   ALBUMIN 4.2 01/21/2015 0823   ALBUMIN 2.9* 01/30/2013 0455   AST 16 01/21/2015 0823   ALT 22 01/21/2015 0823   ALKPHOS 120* 01/21/2015 0823   BILITOT 0.7 01/21/2015 0823   BILITOT 0.2* 01/30/2013 0455   GFRNONAA 85 01/21/2015 0823   GFRAA 98 01/21/2015 0823    Assessment and Plan :  1. Essential (primary) hypertension Restart HCTZ 25 q am. RTC 1-2 months. 2. Need for influenza vaccination  - Flu Vaccine QUAD 36+ mos PF IM (Fluarix & Fluzone Quad PF)  3. OSA (obstructive sleep apnea) Doing well on CPAP. Using CPAP nightly. 4. Chronic pain Pt  has CT of his entire spine this week. He is unable to get MRI because of his implant in the spine. If they are unable to offer any procedures for him going forward we can take care of writing the narcotics. More than half of this visit is spent in counseling.  Miguel Aschoff MD [facility] 05/14/2015 4:36 PM

## 2015-05-17 ENCOUNTER — Other Ambulatory Visit: Payer: Self-pay | Admitting: Family Medicine

## 2015-05-17 ENCOUNTER — Ambulatory Visit
Admission: RE | Admit: 2015-05-17 | Discharge: 2015-05-17 | Disposition: A | Discharge status: Home / Self Care | Payer: 59 | Source: Ambulatory Visit | Attending: Pain Medicine | Admitting: Pain Medicine

## 2015-05-17 ENCOUNTER — Inpatient Hospital Stay: Admission: RE | Admit: 2015-05-17 | Payer: Managed Care, Other (non HMO) | Source: Ambulatory Visit

## 2015-05-17 DIAGNOSIS — M5432 Sciatica, left side: Secondary | ICD-10-CM | POA: Insufficient documentation

## 2015-05-17 DIAGNOSIS — M4806 Spinal stenosis, lumbar region: Secondary | ICD-10-CM | POA: Insufficient documentation

## 2015-05-17 DIAGNOSIS — M961 Postlaminectomy syndrome, not elsewhere classified: Secondary | ICD-10-CM | POA: Insufficient documentation

## 2015-05-17 DIAGNOSIS — M545 Low back pain: Secondary | ICD-10-CM

## 2015-07-09 ENCOUNTER — Ambulatory Visit (INDEPENDENT_AMBULATORY_CARE_PROVIDER_SITE_OTHER): Payer: 59 | Admitting: Family Medicine

## 2015-07-09 VITALS — BP 152/80 | HR 80 | Temp 97.5°F | Resp 16 | Wt 218.0 lb

## 2015-07-09 DIAGNOSIS — I10 Essential (primary) hypertension: Secondary | ICD-10-CM | POA: Diagnosis not present

## 2015-07-09 DIAGNOSIS — E119 Type 2 diabetes mellitus without complications: Secondary | ICD-10-CM | POA: Diagnosis not present

## 2015-07-09 NOTE — Progress Notes (Signed)
Patient ID: Jeff [name], male   DOB: 1961-07-19, 54 y.o.   MRN: [id number]   Jeff [name]  MRN: [id number] DOB: 07-07-1961  Subjective:  HPI  1. Essential (primary) hypertension The patient is a 54 year old male who presents today for follow up of his hypertension.  He was last seen on 05/14/15 and was restarted on his HCTZ at 25 mg daily.  He is tolerating the medication and reports that when he checks his blood pressure he has been getting readings mostly in the 130's over 80's.   Patient Active Problem List   Diagnosis Date Noted  . Abnormal kidney function 12/06/2014  . Cervical nerve root disorder 12/06/2014  . Colon polyp 12/06/2014  . Clinical depression 12/06/2014  . Essential (primary) hypertension 12/06/2014  . Cephalalgia 12/06/2014  . Bulge of cervical disc without myelopathy 12/06/2014  . HLD (hyperlipidemia) 12/06/2014  . 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 Displacement of lumbar intervertebral disc without myelopathy 12/06/2014  . L-S radiculopathy 12/06/2014  . Lyme disease 12/06/2014  . Mild major depression ([place]) 12/06/2014  . Neuropathy ([place]) 12/06/2014  . Adiposity 12/06/2014  . Peripheral vascular disease ([place]) 12/06/2014  . Diabetes mellitus, type 2 ([place]) 12/06/2014  . Cervical post-laminectomy syndrome 10/04/2013  . Cervical pain 10/04/2013  . Peripheral neuropathic pain ([place]) 10/04/2013  . Failed back syndrome of thoracic spine 10/04/2013  . Fatigue 03/17/2013  . Hypokalemia 01/30/2013  . Positive D dimer 01/30/2013  . Chest pain 01/28/2013  . Seizures ([place]) 01/28/2013  . Syncope 01/28/2013  . Chronic pain associated with significant psychosocial dysfunction 08/17/2012  . Polypharmacy 08/02/2012  . 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 Post laminectomy syndrome 08/02/2012  . Thoracic spinal stenosis 06/22/2011    Past Medical History  Diagnosis Date  . Hypertension   . Tuberculosis     POSITIVE  TB SKIN TEST 1992.6 MTH TX   . Neuromuscular  disorder ([place])     "back nerve stimulator"  . Diabetes mellitus without complication ([place])   . Hyperlipidemia   . PAD (peripheral artery disease) ([place])   . Fatigue   . Hypogonadism male   . Neuropathy [facility])     Social History   Social History  . Marital Status: Married    Spouse Name: N/A  . Number of Children: N/A  . Years of Education: N/A   Occupational History  . works at [employer]   . [place] History Main Topics  . Smoking status: Former Smoker -- 1.50 packs/day for 30 years    Types: Cigarettes    Quit date: 07/08/1995  . Smokeless tobacco: Former Systems developer    Quit date: 12/16/1995  . Alcohol Use: 0.6 oz/week    1 Standard drinks or equivalent per week     Comment: Beer  . Drug Use: No  . Sexual Activity: Yes    Birth Control/ Protection: None   Other Topics Concern  . 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    Outpatient Prescriptions Prior to Visit  Medication Sig Dispense Refill  . aspirin 81 MG tablet Take 81 mg by mouth daily.    Marland Kitchen atorvastatin (LIPITOR) 10 MG tablet Take by mouth.    . carisoprodol (SOMA) 350 MG tablet TAKE 1 TABLET BY MOUTH 3 TIMES A DAY AS NEEDED FOR HEADACHES 90 tablet 5  . hydrochlorothiazide (HYDRODIURIL) 25 MG tablet Take 1 tablet (25 mg total)  by mouth daily. 90 tablet 3  . KLOR-CON M20 20 MEQ tablet TAKE 1 TABLET BY MOUTH ONCE A DAY 30 tablet 12  . losartan (COZAAR) 100 MG tablet Take 1 tablet (100 mg total) by mouth daily. 30 tablet 12  . LYRICA 100 MG capsule Take 100 mg by mouth 3 (three) times daily.  0  . Melatonin CR 3 MG TBCR Take by mouth.    . Oxycodone HCl 10 MG TABS Take by mouth.    . Venlafaxine HCl 150 MG TB24 TAKE 1 TABLET BY MOUTH EVERY DAY 30 tablet 11  . HYDROmorphone HCl (EXALGO) 8 MG T24A SR tablet Take one tab PO daily    . Oxymorphone HCl, Crush Resist, 20 MG T12A Take by mouth.    . Pramoxine-HC-Chloroxylenol Aq  (CORTANE-B AQUEOUS) 10-10-1 MG/ML SOLN Place 3 drops in ear(s) 4 (four) times daily. 1 Bottle 1  . pregabalin (LYRICA) 75 MG capsule Take by mouth.    . tiaGABine (GABITRIL) 4 MG tablet Take 2 tablets by mouth at bedtime.  1  . valsartan-hydrochlorothiazide (DIOVAN-HCT) 160-25 MG per tablet Take 1 tablet by mouth daily.     No facility-administered medications prior to visit.    Allergies  Allergen Reactions  . Cymbalta [Duloxetine Hcl] Other (See Comments)    "jittery" and "loopy" per pt  . Duloxetine Other (See Comments)  . Gabapentin Other (See Comments)    caused severe tremor "jittery" and "loopy" per pt.  Recardo Evangelist [Pregabalin] Swelling  . Topamax  [Topiramate] Other (See Comments)    unresponsive  episodes  . Keppra [Levetiracetam] Other (See Comments) and Palpitations    "jittery", and "loopy." per pt.    Review of Systems  Constitutional: Negative for fever, chills and malaise/fatigue.  Eyes: Negative.   Respiratory: Negative for cough, shortness of breath and wheezing.   Cardiovascular: Negative for chest pain, palpitations, orthopnea and leg swelling.  Gastrointestinal: Negative.   Genitourinary: Negative.   Neurological: Positive for headaches. Negative for dizziness and weakness.  Psychiatric/Behavioral: Negative.    Objective:  BP 152/80 mmHg  Pulse 80  Temp(Src) 97.5 F (36.4 C) (Oral)  Resp 16  Wt 218 lb (98.884 kg)  Physical Exam  Constitutional: He is oriented to person, place, and time and well-developed, well-nourished, and in no distress.  HENT:  Head: Normocephalic and atraumatic.  Right Ear: External ear normal.  Left Ear: External ear normal.  Nose: Nose normal.  Eyes: Conjunctivae are normal. Pupils are equal, round, and reactive to light.  Neck: Normal range of motion. Neck supple.  Cardiovascular: Normal rate, regular rhythm and normal heart sounds.   Pulmonary/Chest: Effort normal and breath sounds normal.  Abdominal: Soft.    Neurological: He is alert and oriented to person, place, and time. Gait normal.  Skin: Skin is warm and dry.  Patient with very fair skin.  Psychiatric: Affect normal.    Assessment and Plan :   1. Essential (primary) hypertension   2. Type 2 diabetes mellitus without complication, without long-term current use of insulin (HCC)  - Hemoglobin A1c - Renal function panel 3.Cervical.thoracic,LS radiculopathy 4.Complex Regional Pain Syndrome (CRPD) of both LE Followed by Dr Darral Dash of pain clinic. 5.MDD In remission. 6.HLD I have done the exam and reviewed the above chart and it is accurate to the best of my knowledge.  Miguel Aschoff MD [facility] 07/09/2015 4:29 PM

## 2015-07-18 LAB — RENAL FUNCTION PANEL
ALBUMIN: 4.5 g/dL (ref 3.5–5.5)
BUN/Creatinine Ratio: 14 (ref 9–20)
BUN: 16 mg/dL (ref 6–24)
CHLORIDE: 89 mmol/L — AB (ref 96–106)
CO2: 26 mmol/L (ref 18–29)
Calcium: 9.8 mg/dL (ref 8.7–10.2)
Creatinine, Ser: 1.13 mg/dL (ref 0.76–1.27)
GFR calc Af Amer: 84 mL/min/{1.73_m2} (ref >59)
GFR calc non Af Amer: 72 mL/min/{1.73_m2} (ref >59)
GLUCOSE: 513 mg/dL — AB (ref 65–99)
PHOSPHORUS: 3.8 mg/dL (ref 2.5–4.5)
POTASSIUM: 4.7 mmol/L (ref 3.5–5.2)
Sodium: 131 mmol/L — ABNORMAL LOW (ref 134–144)

## 2015-07-18 LAB — HEMOGLOBIN A1C
Est. average glucose Bld gHb Est-mCnc: 315 mg/dL
Hgb A1c MFr Bld: 12.6 % — ABNORMAL HIGH (ref 4.8–5.6)

## 2015-07-18 MED ORDER — METFORMIN HCL 1000 MG PO TABS: 1000.0000 mg | ORAL_TABLET | Freq: Two times a day (BID) | ORAL | Status: DC

## 2015-07-18 NOTE — Addendum Note (Signed)
Addended by: Arnette Norris on: 07/18/2015 10:25 AM   Modules accepted: Orders

## 2015-08-07 ENCOUNTER — Ambulatory Visit: Payer: Self-pay | Admitting: Family Medicine

## 2015-08-12 ENCOUNTER — Ambulatory Visit: Payer: Self-pay | Admitting: Family Medicine

## 2015-08-14 ENCOUNTER — Encounter: Payer: Self-pay | Admitting: Family Medicine

## 2015-08-14 ENCOUNTER — Ambulatory Visit (INDEPENDENT_AMBULATORY_CARE_PROVIDER_SITE_OTHER): Payer: 59 | Admitting: Family Medicine

## 2015-08-14 VITALS — BP 142/80 | HR 84 | Temp 98.4°F | Resp 16 | Wt 219.0 lb

## 2015-08-14 DIAGNOSIS — G4733 Obstructive sleep apnea (adult) (pediatric): Secondary | ICD-10-CM

## 2015-08-14 DIAGNOSIS — F320 Major depressive disorder, single episode, mild: Secondary | ICD-10-CM | POA: Diagnosis not present

## 2015-08-14 DIAGNOSIS — M5020 Other cervical disc displacement, unspecified cervical region: Secondary | ICD-10-CM | POA: Diagnosis not present

## 2015-08-14 DIAGNOSIS — E118 Type 2 diabetes mellitus with unspecified complications: Secondary | ICD-10-CM | POA: Diagnosis not present

## 2015-08-14 DIAGNOSIS — M5417 Radiculopathy, lumbosacral region: Secondary | ICD-10-CM | POA: Diagnosis not present

## 2015-08-14 DIAGNOSIS — M5030 Other cervical disc degeneration, unspecified cervical region: Secondary | ICD-10-CM

## 2015-08-14 MED ORDER — GLUCOSE BLOOD VI STRP: ORAL_STRIP | Status: DC

## 2015-08-14 NOTE — Progress Notes (Signed)
Patient ID: Jeff [name], male   DOB: [birth year], 55 y.o.   MRN: [id number]       Patient: Jeff [name] Male    DOB: 1961/04/11   55 y.o.   MRN: [id number] Visit Date: 08/14/2015  Today's Provider: [name], MD   Chief Complaint  Patient presents with  . Diabetes    1 month F/U   Subjective:    HPI  Diabetes Mellitus Type II, Follow-up:   Lab Results  Component Value Date   HGBA1C 12.6* 07/17/2015   HGBA1C 6.4* 09/19/2014    Last seen for diabetes 1 months ago.  Management since then includes starting Metformin 1000mg  BID . He reports good compliance with treatment. He is not having side effects.  Home blood sugar records: trend: fluctuating a bit. He reports that blood sugars are anywhere between 130-300.   Episodes of hypoglycemia? no  Weight trend: stable Prior visit with dietician: no Current diet: well balanced Current exercise: none  Pertinent Labs:    Component Value Date/Time   CHOL 133 01/21/2015 0823   CHOL 122 10/10/2014   TRIG 136 01/21/2015 0823   HDL 29* 01/21/2015 0823   HDL 27* 10/10/2014   LDLCALC 77 01/21/2015 0823   LDLCALC 66 10/10/2014   CREATININE 1.13 07/17/2015 0910   CREATININE 0.9 09/19/2014    Wt Readings from Last 3 Encounters:  08/14/15 219 lb (99.338 kg)  07/09/15 218 lb (98.884 kg)  05/14/15 223 lb (101.152 kg)    ------------------------------------------------------------------------       Allergies  Allergen Reactions  . Cymbalta [Duloxetine Hcl] Other (See Comments)    "jittery" and "loopy" per pt  . Duloxetine Other (See Comments)  . Gabapentin Other (See Comments)    caused severe tremor "jittery" and "loopy" per pt.  Recardo Evangelist [Pregabalin] Swelling  . Topamax  [Topiramate] Other (See Comments)    unresponsive  episodes  . Keppra [Levetiracetam] Other (See Comments) and Palpitations    "jittery", and "loopy." per pt.   Previous Medications   ASPIRIN 81 MG TABLET    Take 81 mg by mouth  daily.   ATORVASTATIN (LIPITOR) 10 MG TABLET    Take by mouth.   CARISOPRODOL (SOMA) 350 MG TABLET    TAKE 1 TABLET BY MOUTH 3 TIMES A DAY AS NEEDED FOR HEADACHES   HYDROCHLOROTHIAZIDE (HYDRODIURIL) 25 MG TABLET    Take 1 tablet (25 mg total) by mouth daily.   KLOR-CON M20 20 MEQ TABLET    TAKE 1 TABLET BY MOUTH ONCE A DAY   LOSARTAN (COZAAR) 100 MG TABLET    Take 1 tablet (100 mg total) by mouth daily.   LYRICA 100 MG CAPSULE    Take 100 mg by mouth 3 (three) times daily.   MELATONIN CR 3 MG TBCR    Take by mouth.   METFORMIN (GLUCOPHAGE) 1000 MG TABLET    Take 1 tablet (1,000 mg total) by mouth 2 (two) times daily with a meal.   OXYCODONE HCL 10 MG TABS    Take by mouth.   SAVELLA 25 MG TABS       VENLAFAXINE HCL 150 MG TB24    TAKE 1 TABLET BY MOUTH EVERY DAY    Review of Systems  Constitutional: Negative.   Respiratory: Negative.   Cardiovascular: Negative.   Gastrointestinal: Negative.   Endocrine: Negative.   Musculoskeletal: Positive for back pain, arthralgias and neck pain.  Neurological: Negative.   Psychiatric/Behavioral: Negative.  Social History  Substance Use Topics  . Smoking status: Former Smoker -- 1.50 packs/day for 30 years    Types: Cigarettes    Quit date: 07/08/1995  . Smokeless tobacco: Former Systems developer    Quit date: 12/16/1995  . Alcohol Use: 0.6 oz/week    1 Standard drinks or equivalent per week     Comment: Beer   Objective:   BP 142/80 mmHg  Pulse 84  Temp(Src) 98.4 F (36.9 C)  Resp 16  Wt 219 lb (99.338 kg)  Physical Exam  Constitutional: He is oriented to person, place, and time. He appears well-developed and well-nourished.  HENT:  Head: Normocephalic and atraumatic.  Right Ear: External ear normal.  Left Ear: External ear normal.  Eyes: Conjunctivae are normal. Pupils are equal, round, and reactive to light.  Neck: Normal range of motion. Neck supple.  Cardiovascular: Normal rate, regular rhythm, normal heart sounds and intact distal  pulses.   No murmur heard. Pulmonary/Chest: Effort normal and breath sounds normal. No respiratory distress. He has no wheezes.  Musculoskeletal: He exhibits no edema or tenderness.  Neurological: He is alert and oriented to person, place, and time.  Skin: Skin is warm and dry.  Psychiatric: He has a normal mood and affect. His behavior is normal. Judgment and thought content normal.        Assessment & Plan:     1. Type 2 diabetes mellitus with complication, without long-term current use of insulin (HCC) Sugars are better since been on Metformin. - glucose blood (ONETOUCH VERIO) test strip; Use as instructed  Dispense: 100 each; Refill: 12  2. OSA (obstructive sleep apnea) Stable on CPAP.  3. L-S radiculopathy Not any better. Having bad days recently with the pain.  4. Bulge of cervical disc without myelopathy         [name], MD  [facility]

## 2015-09-14 ENCOUNTER — Other Ambulatory Visit: Payer: Self-pay | Admitting: Family Medicine

## 2015-09-23 ENCOUNTER — Other Ambulatory Visit: Payer: Self-pay | Admitting: Family Medicine

## 2015-09-30 ENCOUNTER — Other Ambulatory Visit: Payer: Self-pay | Admitting: Family Medicine

## 2015-10-08 ENCOUNTER — Telehealth: Payer: Self-pay | Admitting: Family Medicine

## 2015-11-07 ENCOUNTER — Encounter: Payer: Self-pay | Admitting: Family Medicine

## 2015-11-07 ENCOUNTER — Ambulatory Visit (INDEPENDENT_AMBULATORY_CARE_PROVIDER_SITE_OTHER): Payer: 59 | Admitting: Family Medicine

## 2015-11-07 VITALS — BP 136/72 | HR 78 | Temp 97.7°F | Resp 18 | Wt 223.0 lb

## 2015-11-07 DIAGNOSIS — E118 Type 2 diabetes mellitus with unspecified complications: Secondary | ICD-10-CM | POA: Diagnosis not present

## 2015-11-07 DIAGNOSIS — F320 Major depressive disorder, single episode, mild: Secondary | ICD-10-CM | POA: Diagnosis not present

## 2015-11-07 DIAGNOSIS — E785 Hyperlipidemia, unspecified: Secondary | ICD-10-CM | POA: Diagnosis not present

## 2015-11-07 DIAGNOSIS — I10 Essential (primary) hypertension: Secondary | ICD-10-CM

## 2015-11-07 LAB — POCT GLYCOSYLATED HEMOGLOBIN (HGB A1C): Hemoglobin A1C: 5.9

## 2015-11-07 NOTE — Progress Notes (Signed)
Patient ID: Jeff [name], male   DOB: 08/27/1960, 55 y.o.   MRN: [id number]    Subjective:  HPI  Diabetes Mellitus Type II, Follow-up:   Lab Results  Component Value Date   HGBA1C 12.6* 07/17/2015   HGBA1C 6.4* 09/19/2014   Last seen for diabetes 4 months ago.  Management since then includes none. He reports good compliance with treatment. He is not having side effects.  Current symptoms include none and have been unchanged. Home blood sugar records: 80's-115  Episodes of hypoglycemia? no   Current Insulin Regimen: n/a Current exercise: walking more  ------------------------------------------------------------------------   Hypertension, follow-up:  BP Readings from Last 3 Encounters:  11/07/15 136/72  08/14/15 142/80  07/09/15 152/80    He was last seen for hypertension 4 months ago.  BP at that visit was 142/80. Management since that visit includes none .He reports good compliance with treatment. He is not having side effects.  He is exercising. He is adherent to low salt diet.   Outside blood pressures are 130's/70's. He is experiencing none.  Patient denies chest pain, chest pressure/discomfort, claudication, dyspnea, exertional chest pressure/discomfort, fatigue, irregular heart beat, lower extremity edema, near-syncope, orthopnea, palpitations and paroxysmal nocturnal dyspnea.    ------------------------------------------------------------------------    Prior to Admission medications   Medication Sig Start Date End Date Taking? Authorizing Provider  aspirin 81 MG tablet Take 81 mg by mouth daily.   Yes Historical Provider, MD  atorvastatin (LIPITOR) 10 MG tablet TAKE 1 TABLET BY MOUTH AT BEDTIME 09/16/15  Yes Richard Maceo Pro., MD  Buprenorphine HCl (BELBUCA) 300 MCG FILM Place inside cheek. 10/25/15  Yes Historical Provider, MD  carisoprodol (SOMA) 350 MG tablet TAKE 1 TABLET BY MOUTH 3 TIMES A DAY AS NEEDED FOR HEADACHES 09/24/15  Yes Richard Maceo Pro., MD  CIALIS 5 MG tablet TAKE 1 TABLET BY MOUTH DAILY AS NEEDED 09/30/15  Yes Jerrol Banana., MD  glucose blood [facility] VERIO) test strip Use as instructed 08/14/15  Yes Richard Maceo Pro., MD  hydrochlorothiazide (HYDRODIURIL) 25 MG tablet Take 1 tablet (25 mg total) by mouth daily. 05/14/15  Yes Richard Maceo Pro., MD  KLOR-CON M20 20 MEQ tablet TAKE 1 TABLET BY MOUTH ONCE A DAY 02/12/15  Yes Richard Maceo Pro., MD  losartan (COZAAR) 100 MG tablet Take 1 tablet (100 mg total) by mouth daily. 01/15/15  Yes Richard Maceo Pro., MD  LYRICA 100 MG capsule Take 100 mg by mouth 3 (three) times daily. 02/05/15  Yes Historical Provider, MD  Melatonin CR 3 MG TBCR Take by mouth.   Yes Historical Provider, MD  metFORMIN (GLUCOPHAGE) 1000 MG tablet Take 1 tablet (1,000 mg total) by mouth 2 (two) times daily with a meal. 07/18/15  Yes Jerrol Banana., MD  Oxycodone HCl 10 MG TABS Take by mouth. 09/03/11  Yes Historical Provider, MD  SAVELLA 25 MG TABS  07/05/15  Yes Historical Provider, MD  Venlafaxine HCl 150 MG TB24 TAKE 1 TABLET BY MOUTH EVERY DAY 05/20/15  Yes Jerrol Banana., MD  baclofen (LIORESAL) 10 MG tablet Reported on 11/07/2015 10/20/15   Historical Provider, MD    Patient Active Problem List   Diagnosis Date Noted  . Abnormal kidney function 12/06/2014  . Cervical nerve root disorder 12/06/2014  . Colon polyp 12/06/2014  . Clinical depression 12/06/2014  . Essential (primary) hypertension 12/06/2014  . Cephalalgia 12/06/2014  . Bulge of cervical disc without myelopathy  12/06/2014  . HLD (hyperlipidemia) 12/06/2014  . 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 Displacement of lumbar intervertebral disc without myelopathy 12/06/2014  . L-S radiculopathy 12/06/2014  . Lyme disease 12/06/2014  . Mild major depression ([place]) 12/06/2014  . Neuropathy ([place]) 12/06/2014  . Adiposity 12/06/2014  . Peripheral vascular disease ([place]) 12/06/2014  . Diabetes mellitus, type 2 ([place])  12/06/2014  . Cervical post-laminectomy syndrome 10/04/2013  . Cervical pain 10/04/2013  . 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 Failed back syndrome of thoracic spine 10/04/2013  . Fatigue 03/17/2013  . Hypokalemia 01/30/2013  . Positive D dimer 01/30/2013  . Chest pain 01/28/2013  . Seizures ([place]) 01/28/2013  . Syncope 01/28/2013  . Chronic pain associated with significant psychosocial dysfunction 08/17/2012  . Polypharmacy 08/02/2012  . 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 Post laminectomy syndrome 08/02/2012  . Thoracic spinal stenosis 06/22/2011    Past Medical History  Diagnosis Date  . Hypertension   . Tuberculosis     POSITIVE  TB SKIN TEST 1992.6 MTH TX   . Neuromuscular disorder ([place])     "back nerve stimulator"  . Diabetes mellitus without complication ([place])   . Hyperlipidemia   . PAD (peripheral artery disease) ([place])   . Fatigue   . Hypogonadism male   . Neuropathy [facility]     Social History   Social History  . Marital Status: Married    Spouse Name: N/A  . Number of Children: N/A  . Years of Education: N/A   Occupational History  . works at [employer]   . [place] History Main Topics  . Smoking status: Former Smoker -- 1.50 packs/day for 30 years    Types: Cigarettes    Quit date: 07/08/1995  . Smokeless tobacco: Former Systems developer    Quit date: 12/16/1995  . Alcohol Use: 0.6 oz/week    1 Standard drinks or equivalent per week     Comment: Beer  . Drug Use: No  . Sexual Activity: Yes    Birth Control/ Protection: None   Other Topics Concern  . 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    Allergies  Allergen Reactions  . Cymbalta [Duloxetine Hcl] Other (See Comments)    "jittery" and "loopy" per pt  . Duloxetine Other (See Comments)  . Gabapentin Other (See Comments)    caused severe tremor "jittery" and "loopy" per pt.  Recardo Evangelist [Pregabalin]  Swelling  . Topamax  [Topiramate] Other (See Comments)    unresponsive  episodes  . Keppra [Levetiracetam] Other (See Comments) and Palpitations    "jittery", and "loopy." per pt.    Review of Systems  Constitutional: Negative.   HENT: Negative.   Eyes: Negative.   Respiratory: Negative.   Cardiovascular: Negative.   Gastrointestinal: Negative.   Genitourinary: Negative.   Musculoskeletal: Positive for back pain.  Skin: Negative.   Neurological: Negative.   Endo/Heme/Allergies: Negative.   Psychiatric/Behavioral: Negative.     Immunization History  Administered Date(s) Administered  . Influenza,inj,Quad PF,36+ Mos 05/14/2015  . Pneumococcal Polysaccharide-23 01/21/2012  . Tdap 01/21/2012   Objective:  BP 136/72 mmHg  Pulse 78  Temp(Src) 97.7 F (36.5 C) (Oral)  Resp 18  Wt 223 lb (101.152 kg)  Physical Exam  Constitutional: He is oriented to person, place, and time and well-developed, well-nourished, and in no distress.  Eyes: Conjunctivae and EOM are normal. Pupils are equal, round, and reactive  to light.  Neck: Normal range of motion. Neck supple.  Cardiovascular: Normal rate, regular rhythm, normal heart sounds and intact distal pulses.   Pulmonary/Chest: Effort normal and breath sounds normal.  Abdominal: Soft.  Musculoskeletal: Normal range of motion.  Neurological: He is alert and oriented to person, place, and time. He has normal reflexes. Gait normal. GCS score is 15.  Skin: Skin is warm and dry.  Reddish hair, very fair skin  Psychiatric: Mood, memory, affect and judgment normal.    Lab Results  Component Value Date   WBC CANCELED 01/21/2015   HGB 16.5 09/19/2014   HCT CANCELED 01/21/2015   PLT CANCELED 01/21/2015   GLUCOSE 513* 07/17/2015   CHOL 133 01/21/2015   TRIG 136 01/21/2015   HDL 29* 01/21/2015   LDLCALC 77 01/21/2015   TSH 0.985 01/21/2015   PSA 0.9 09/19/2014   INR 0.93 06/16/2011   HGBA1C 12.6* 07/17/2015    CMP     Component  Value Date/Time   NA 131* 07/17/2015 0910   NA 142 01/30/2013 0455   K 4.7 07/17/2015 0910   K 4.1 12/28/2012 1508   CL 89* 07/17/2015 0910   CO2 26 07/17/2015 0910   GLUCOSE 513* 07/17/2015 0910   GLUCOSE 79 01/30/2013 0455   BUN 16 07/17/2015 0910   BUN 10 01/30/2013 0455   CREATININE 1.13 07/17/2015 0910   CREATININE 0.9 09/19/2014   CALCIUM 9.8 07/17/2015 0910   PROT 7.3 01/21/2015 0823   PROT 6.0 01/30/2013 0455   ALBUMIN 4.5 07/17/2015 0910   ALBUMIN 2.9* 01/30/2013 0455   AST 16 01/21/2015 0823   ALT 22 01/21/2015 0823   ALKPHOS 120* 01/21/2015 0823   BILITOT 0.7 01/21/2015 0823   BILITOT 0.2* 01/30/2013 0455   GFRNONAA 72 07/17/2015 0910   GFRAA 84 07/17/2015 0910    Assessment and Plan :  1. Essential (primary) hypertension   2. Type 2 diabetes mellitus with complication, without long-term current use of insulin (HCC)  - POCT HgB A1C 5.9 today.   3. HLD (hyperlipidemia)   4. Mild major depression ([place]) 5. Pain/degenerative disc disease Followed at pain clinic. I have done the exam and reviewed the above chart and it is accurate to the best of my knowledge.  Patient was seen and examined by Dr. Miguel Aschoff, and noted scribed by Webb Laws, W[place] MD [facility] 11/07/2015 4:23 PM

## 2016-01-02 ENCOUNTER — Encounter: Payer: Self-pay | Admitting: Emergency Medicine

## 2016-01-02 ENCOUNTER — Emergency Department
Admission: EM | Admit: 2016-01-02 | Discharge: 2016-01-03 | Disposition: A | Discharge status: Home / Self Care | Payer: 59 | Attending: Emergency Medicine | Admitting: Emergency Medicine

## 2016-01-02 DIAGNOSIS — E785 Hyperlipidemia, unspecified: Secondary | ICD-10-CM | POA: Diagnosis not present

## 2016-01-02 DIAGNOSIS — Z7984 Long term (current) use of oral hypoglycemic drugs: Secondary | ICD-10-CM | POA: Insufficient documentation

## 2016-01-02 DIAGNOSIS — I10 Essential (primary) hypertension: Secondary | ICD-10-CM | POA: Insufficient documentation

## 2016-01-02 DIAGNOSIS — E119 Type 2 diabetes mellitus without complications: Secondary | ICD-10-CM | POA: Diagnosis not present

## 2016-01-02 DIAGNOSIS — Z79899 Other long term (current) drug therapy: Secondary | ICD-10-CM | POA: Insufficient documentation

## 2016-01-02 DIAGNOSIS — Z87891 Personal history of nicotine dependence: Secondary | ICD-10-CM | POA: Diagnosis not present

## 2016-01-02 DIAGNOSIS — M542 Cervicalgia: Secondary | ICD-10-CM | POA: Insufficient documentation

## 2016-01-02 DIAGNOSIS — G8929 Other chronic pain: Secondary | ICD-10-CM | POA: Diagnosis not present

## 2016-01-02 DIAGNOSIS — Z7982 Long term (current) use of aspirin: Secondary | ICD-10-CM | POA: Insufficient documentation

## 2016-01-02 NOTE — ED Notes (Signed)
Left arm pain.  Onset of symptoms 2-3 days ago.  Pain worsening over past several days.  States has current neck pain problems, previous surgery and cervical disc pain.   Pain started left shoulder / upper back and radiates down to elbow.  Also some numbness to left third, fourth, and fifth fingers.

## 2016-01-03 LAB — BASIC METABOLIC PANEL
ANION GAP: 13 (ref 5–15)
BUN: 16 mg/dL (ref 6–20)
CALCIUM: 8.3 mg/dL — AB (ref 8.9–10.3)
CO2: 24 mmol/L (ref 22–32)
Chloride: 101 mmol/L (ref 101–111)
Creatinine, Ser: 0.85 mg/dL (ref 0.61–1.24)
GFR calc Af Amer: >60 mL/min (ref >60)
GFR calc non Af Amer: >60 mL/min (ref >60)
GLUCOSE: 112 mg/dL — AB (ref 65–99)
Potassium: 3.7 mmol/L (ref 3.5–5.1)
Sodium: 138 mmol/L (ref 135–145)

## 2016-01-03 LAB — CBC
HEMATOCRIT: 42.4 % (ref 40.0–52.0)
Hemoglobin: 15 g/dL (ref 13.0–18.0)
MCH: 32.4 pg (ref 26.0–34.0)
MCHC: 35.4 g/dL (ref 32.0–36.0)
MCV: 91.6 fL (ref 80.0–100.0)
Platelets: 213 10*3/uL (ref 150–440)
RBC: 4.63 MIL/uL (ref 4.40–5.90)
RDW: 12.5 % (ref 11.5–14.5)
WBC: 7.8 10*3/uL (ref 3.8–10.6)

## 2016-01-03 LAB — TROPONIN I: Troponin I: <0.03 ng/mL (ref <0.031)

## 2016-01-03 MED ORDER — OXYCODONE-ACETAMINOPHEN 5-325 MG PO TABS
2.0000 | ORAL_TABLET | ORAL | Status: AC
  Administered 2016-01-03: 2 via ORAL
  Filled 2016-01-03: qty 2

## 2016-01-03 NOTE — ED Notes (Signed)
Pt reports hx of cervical disc degeneration. Pt reports he goes to pain clinic for issue. Pt c/o new left shoulder pain radiating left arm. Pt c/o intermittent tingling in 3rd-5th digits left hand.

## 2016-01-03 NOTE — ED Notes (Signed)
Pt reports he has nerve stimulator inserted in back, with battery on left lower back

## 2016-01-03 NOTE — ED Provider Notes (Signed)
[facility] Emergency Department Provider Note  ____________________________________________  Time seen: Approximately 12:25 AM  I have reviewed the triage vital signs and the nursing notes.   HISTORY  Chief Complaint Shoulder Pain    HPI Jeff [name] is a 55 y.o. male reports he's had a long history of trouble with his left side of the neck and left arm due to chronic neck problems. He reports that he is having ongoing pain, and that he is recently decreased on his oxycodone by his pain physician and increased him ona new medicine to buprenorphine. He reports this is not relieving his pain, and that throughout the last 2 weeks he's had aggravating pain shooting from the left neck down the left arm with some tingling in the third through fifth fingers. He reports the symptoms are not new and he had the same symptoms many time for the last couple of years. He reports is not able to sleep well due to the pain. He denies "chest pain" trouble breathing, shortness of breath, fevers chills or other symptoms at this time. He reports he needs to get some relief from the left-sided neck and arm pain. No weakness in the hand or arm, mostly a shooting stinging burning pain down into the left hand from the base of the left neck.  Patient reports he last took his buprenorphine at about 3 PM.  Past Medical History  Diagnosis Date  . Hypertension   . Tuberculosis     POSITIVE  TB SKIN TEST 1992.6 MTH TX   . Neuromuscular disorder ([place])     "back nerve stimulator"  . Diabetes mellitus without complication ([place])   . Hyperlipidemia   . PAD (peripheral artery disease) ([place])   . Fatigue   . Hypogonadism male   . Neuropathy [facility])     Patient Active Problem List   Diagnosis Date Noted  . Abnormal kidney function 12/06/2014  . Cervical nerve root disorder 12/06/2014  . Colon polyp 12/06/2014  . Clinical depression 12/06/2014  . Essential (primary) hypertension  12/06/2014  . Cephalalgia 12/06/2014  . Bulge of cervical disc without myelopathy 12/06/2014  . HLD (hyperlipidemia) 12/06/2014  . 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 Displacement of lumbar intervertebral disc without myelopathy 12/06/2014  . L-S radiculopathy 12/06/2014  . Lyme disease 12/06/2014  . Mild major depression ([place]) 12/06/2014  . Neuropathy ([place]) 12/06/2014  . Adiposity 12/06/2014  . Peripheral vascular disease ([place]) 12/06/2014  . Diabetes mellitus, type 2 ([place]) 12/06/2014  . Cervical post-laminectomy syndrome 10/04/2013  . Cervical pain 10/04/2013  . 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 Failed back syndrome of thoracic spine 10/04/2013  . Fatigue 03/17/2013  . Hypokalemia 01/30/2013  . Positive D dimer 01/30/2013  . Chest pain 01/28/2013  . Seizures ([place]) 01/28/2013  . Syncope 01/28/2013  . Chronic pain associated with significant psychosocial dysfunction 08/17/2012  . Polypharmacy 08/02/2012  . 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 Post laminectomy syndrome 08/02/2012  . Thoracic spinal stenosis 06/22/2011    Past Surgical History  Procedure Laterality Date  . Cervical fusion    . Carpal tunnel release    . Knee arthroscopy    . Lumbar laminectomy/decompression microdiscectomy  06/22/2011    Procedure: LUMBAR LAMINECTOMY/DECOMPRESSION MICRODISCECTOMY;  Surgeon: Ophelia Charter;  Location: [facility] NEURO ORS;  Service: Neurosurgery;  Laterality: N/A;  Thoracic Ten-Eleven,Thoracic Eleven-Twelve Laminectomy  . Cardiac catheterization  01/31/2013    Medical management  . Left heart catheterization with coronary angiogram N/A 01/31/2013  Procedure: LEFT HEART CATHETERIZATION WITH CORONARY ANGIOGRAM;  Surgeon: Minus Breeding, MD;  Location: [facility] CATH LAB;  Service: Cardiovascular;  Laterality: N/A;  . Vasectomy  1991    Current Outpatient Rx  Name  Route  Sig  Dispense  Refill  . aspirin 81 MG tablet   Oral   Take 81 mg by mouth daily.         Marland Kitchen atorvastatin  (LIPITOR) 10 MG tablet      TAKE 1 TABLET BY MOUTH AT BEDTIME   30 tablet   11   . baclofen (LIORESAL) 10 MG tablet      Reported on 11/07/2015         . Buprenorphine HCl (BELBUCA) 300 MCG FILM   Buccal   Place inside cheek.         . carisoprodol (SOMA) 350 MG tablet      TAKE 1 TABLET BY MOUTH 3 TIMES A DAY AS NEEDED FOR HEADACHES   90 tablet   5     This request is for a new prescription for a contr ...   . CIALIS 5 MG tablet      TAKE 1 TABLET BY MOUTH DAILY AS NEEDED   30 tablet   0   . glucose blood (ONETOUCH VERIO) test strip      Use as instructed   100 each   12     E11.9   . hydrochlorothiazide (HYDRODIURIL) 25 MG tablet   Oral   Take 1 tablet (25 mg total) by mouth daily.   90 tablet   3   . KLOR-CON M20 20 MEQ tablet      TAKE 1 TABLET BY MOUTH ONCE A DAY   30 tablet   12   . losartan (COZAAR) 100 MG tablet   Oral   Take 1 tablet (100 mg total) by mouth daily.   30 tablet   12   . LYRICA 100 MG capsule   Oral   Take 100 mg by mouth 3 (three) times daily.      0     Dispense as written.   . Melatonin CR 3 MG TBCR   Oral   Take by mouth.         . metFORMIN (GLUCOPHAGE) 1000 MG tablet   Oral   Take 1 tablet (1,000 mg total) by mouth 2 (two) times daily with a meal.   60 tablet   5   . Oxycodone HCl 10 MG TABS   Oral   Take by mouth.         Marland Kitchen SAVELLA 25 MG TABS                 Dispense as written.   . Venlafaxine HCl 150 MG TB24      TAKE 1 TABLET BY MOUTH EVERY DAY   30 tablet   11     Allergies Cymbalta; Duloxetine; Gabapentin; Lyrica; Topamax ; and Keppra  Family History  Problem Relation Age of Onset  . Coronary artery disease Father     PPM in his late 75s, CAD Dx 80s  . Hyperlipidemia Father   . Hypertension Father   . Heart disease Father     CABG at age 54  . Diabetes Father   . Atrial fibrillation Mother   . Hypertension Mother   . Transient ischemic attack Mother   . Migraines  Daughter   . Cancer Maternal Uncle     Throat  .  Cancer Maternal Grandmother     Lung cancer    Social History Social History  Substance Use Topics  . Smoking status: Former Smoker -- 1.50 packs/day for 30 years    Types: Cigarettes    Quit date: 07/08/1995  . Smokeless tobacco: Former Systems developer    Quit date: 12/16/1995  . Alcohol Use: 0.6 oz/week    1 Standard drinks or equivalent per week     Comment: Beer    Review of Systems Constitutional: No fever/chills Eyes: No visual changes. ENT: No sore throat. Cardiovascular: Denies chest pain. Respiratory: Denies shortness of breath. Gastrointestinal: No abdominal pain.  No nausea, no vomiting.  No diarrhea.  No constipation. Genitourinary: Negative for dysuria. Musculoskeletal: Negative for back pain. Skin: Negative for rash. Neurological: Negative for headaches, focal weakness or numbness except tingling in the left third through fifth fingers.  10-point ROS otherwise negative.  ____________________________________________   PHYSICAL EXAM:  VITAL SIGNS: ED Triage Vitals  Enc Vitals Group     BP 01/02/16 2148 151/81 mmHg     Pulse Rate 01/02/16 2148 86     Resp 01/02/16 2148 18     Temp 01/02/16 2148 97.9 F (36.6 C)     Temp src --      SpO2 01/02/16 2148 97 %     Weight 01/02/16 2148 220 lb (99.791 kg)     Height 01/02/16 2148 5\' 8"  (1.727 m)     Head Cir --      Peak Flow --      Pain Score 01/02/16 2150 10     Pain Loc --      Pain Edu? --      Excl. in [place]? --    Constitutional: Alert and oriented. Well appearing and in no acute distress. Eyes: Conjunctivae are normal. PERRL. EOMI. Head: Atraumatic. Nose: No congestion/rhinnorhea. Mouth/Throat: Mucous membranes are moist.  Oropharynx non-erythematous. Neck: No stridor.  Patient slightly limited range of motion of the neck, he states this is chronic and no change. Patient reports that when using the left arm pain shoots down from the left base of the neck  into the left arm and fingers. Cardiovascular: Normal rate, regular rhythm. Grossly normal heart sounds.  Good peripheral circulation. Respiratory: Normal respiratory effort.  No retractions. Lungs CTAB. Gastrointestinal: Soft and nontender. No distention. Musculoskeletal:   RIGHT Right upper extremity demonstrates normal strength, good use of all muscles. No edema bruising or contusions of the right shoulder/upper arm, right elbow, right forearm / hand. Full range of motion of the right right upper extremity without pain. No evidence of trauma. Strong radial pulse. Intact median/ulnar/radial neuro-muscular exam.  LEFT Left upper extremity demonstrates normal strength, good use of all muscles. No edema bruising or contusions of the left shoulder/upper arm, left elbow, left forearm / hand. Full range of motion of the left  upper extremity without pain. No evidence of trauma. Strong radial pulse. Intact median/ulnar/radial neuro-muscular exam though the patient does report slight tingling sensation in the third through fifth fingers of the left hand.  Right hand Median, ulnar, radial motor intact. Cap refill less than 2 seconds all digits. Strong radial pulse. 5 out of 5 strength throughout the hand intrinsics, flexion and extension at the wrist. No evidence of trauma.   Left hand Median, ulnar, radial motor intact. Cap refill less than 2 seconds all digits. Strong radial pulse. 5 out of 5 strength throughout the hand intrinsics, flexion and extension at the wrist. No evidence  of trauma.though the patient does report slight tingling sensation in the third through fifth fingers of the left hand ____________________________________________   No lower extremity tenderness nor edema.  No joint effusions. Neurologic:  Normal speech and language. No gross focal neurologic deficits are appreciated. No gait instability. Skin:  Skin is warm, dry and intact. No rash noted. Psychiatric: Mood and affect are  normal. Speech and behavior are normal.  ____________________________________________   LABS (all labs ordered are listed, but only abnormal results are displayed)  Labs Reviewed  BASIC METABOLIC PANEL - Abnormal; Notable for the following:    Glucose, Bld 112 (*)    Calcium 8.3 (*)    All other components within normal limits  TROPONIN I  CBC   ____________________________________________  EKG  ED ECG REPORT I, Marzell Isakson, the attending physician, personally viewed and interpreted this ECG.  Date: 01/03/2016 EKG Time: 2200 Rate: 90 Rhythm: normal sinus rhythm QRS Axis: normal Intervals: normal ST/T Wave abnormalities: normal Conduction Disturbances: none Narrative Interpretation: unremarkable  ____________________________________________  RADIOLOGY   ____________________________________________   PROCEDURES  Procedure(s) performed: None  Critical Care performed: No  ____________________________________________   INITIAL IMPRESSION / ASSESSMENT AND PLAN / ED COURSE  Pertinent labs & imaging results that were available during my care of the patient were reviewed by me and considered in my medical decision making (see chart for details).  Patient presents for evaluation of left hand arm and neck pain. Based on clinical history provided appears to be quite chronic in nature, pain seemingly exacerbated as he is weaned of oxycodone in the last 2 weeks. He is fully awake and alert, he does appear slightly uncomfortable but without evidence of an acute deficit at this time. He denies any chest or cardiac or pulmonary symptoms. His clinical exam and history are overall very reassuring.   Based upon his history of diabetes and hypertension and given radiating pain into the left hand I we will however check EKG, troponin to assure no cardiac etiology though based on the history and exam this appears to be likely very chronic in  nature.  ----------------------------------------- 1:21 AM on 01/03/2016 -----------------------------------------  Cardiac testing normal. Discussed with the patient, and he'll follow up with Dr. Rosanna Randy as well as his pain physician for ongoing discomfort regarding his neck. He is stable with no evidence of acute cardiac, pulmonary, or other concerning condition. Neck pain and tingling in his hands appear to be all very chronic.  Return precautions and treatment recommendations and follow-up discussed with the patient who is agreeable with the plan. Patient's wife will be driving him home.  ____________________________________________   FINAL CLINICAL IMPRESSION(S) / ED DIAGNOSES  Final diagnoses:  Cervicalgia of occipito-atlanto-axial region  Chronic neck pain      Delman Kitten, MD 01/03/16 0121

## 2016-01-03 NOTE — Discharge Instructions (Signed)
You have been seen in the Emergency Department (ED) today for chest pain.  As we have discussed todays test results are normal, but you may require further testing.  Please follow up with the recommended doctor as instructed above in these documents regarding todays emergent visit and your recent symptoms to discuss further management.  No driving this morning.  Return to the Emergency Department (ED) if you experience any chest pain/pressure/tightness, difficulty breathing, sudden weakness in the arm or hand, trouble breathing, or sudden sweating, or other symptoms that concern you.   Radicular Pain Radicular pain in either the arm or leg is usually from a bulging or herniated disk in the spine. A piece of the herniated disk may press against the nerves as the nerves exit the spine. This causes pain which is felt at the tips of the nerves down the arm or leg. Other causes of radicular pain may include:  Fractures.  Heart disease.  Cancer.  An abnormal and usually degenerative state of the nervous system or nerves (neuropathy). Diagnosis may require CT or MRI scanning to determine the primary cause.  Nerves that start at the neck (nerve roots) may cause radicular pain in the outer shoulder and arm. It can spread down to the thumb and fingers. The symptoms vary depending on which nerve root has been affected. In most cases radicular pain improves with conservative treatment. Neck problems may require physical therapy, a neck collar, or cervical traction. Treatment may take many weeks, and surgery may be considered if the symptoms do not improve.  Conservative treatment is also recommended for sciatica. Sciatica causes pain to radiate from the lower back or buttock area down the leg into the foot. Often there is a history of back problems. Most patients with sciatica are better after 2 to 4 weeks of rest and other supportive care. Short term bed rest can reduce the disk pressure considerably.  Sitting, however, is not a good position since this increases the pressure on the disk. You should avoid bending, lifting, and all other activities which make the problem worse. Traction can be used in severe cases. Surgery is usually reserved for patients who do not improve within the first months of treatment. Only take over-the-counter or prescription medicines for pain, discomfort, or fever as directed by your caregiver. Narcotics and muscle relaxants may help by relieving more severe pain and spasm and by providing mild sedation. Cold or massage can give significant relief. Spinal manipulation is not recommended. It can increase the degree of disc protrusion. Epidural steroid injections are often effective treatment for radicular pain. These injections deliver medicine to the spinal nerve in the space between the protective covering of the spinal cord and back bones (vertebrae). Your caregiver can give you more information about steroid injections. These injections are most effective when given within two weeks of the onset of pain.  You should see your caregiver for follow up care as recommended. A program for neck and back injury rehabilitation with stretching and strengthening exercises is an important part of management.  SEEK IMMEDIATE MEDICAL CARE IF:  You develop increased pain, weakness, or numbness in your arm or leg.  You develop difficulty with bladder or bowel control.  You develop abdominal pain.   This information is not intended to replace advice given to you by your health care provider. Make sure you discuss any questions you have with your health care provider.   Document Released: 08/27/2004 Document Revised: 08/10/2014 Document Reviewed: 02/13/2015 Elsevier Interactive  Patient Education 2016 [employer].

## 2016-01-03 NOTE — ED Notes (Signed)
MD Quale at bedside. 

## 2016-01-04 ENCOUNTER — Other Ambulatory Visit: Payer: Self-pay | Admitting: Family Medicine

## 2016-01-08 ENCOUNTER — Ambulatory Visit (INDEPENDENT_AMBULATORY_CARE_PROVIDER_SITE_OTHER): Payer: 59 | Admitting: Family Medicine

## 2016-01-08 VITALS — BP 140/84 | HR 72 | Resp 16 | Wt 225.0 lb

## 2016-01-08 DIAGNOSIS — M5417 Radiculopathy, lumbosacral region: Secondary | ICD-10-CM

## 2016-01-08 DIAGNOSIS — M5020 Other cervical disc displacement, unspecified cervical region: Secondary | ICD-10-CM

## 2016-01-08 DIAGNOSIS — M5030 Other cervical disc degeneration, unspecified cervical region: Secondary | ICD-10-CM

## 2016-01-08 DIAGNOSIS — I10 Essential (primary) hypertension: Secondary | ICD-10-CM | POA: Diagnosis not present

## 2016-01-08 MED ORDER — METOPROLOL SUCCINATE ER 25 MG PO TB24: 25.0000 mg | ORAL_TABLET | Freq: Every day | ORAL | Status: DC

## 2016-01-08 NOTE — Progress Notes (Signed)
Patient ID: Jeff [name], male   DOB: [birth year], 55 y.o.   MRN: [id number]    Subjective:  HPI  Patient has had trouble with elevated b/p-readings have been around 140s-150s/90s-100s, the other day it was 170s/90. This has been an issue for 2 months. He has had a lot of pain in his neck and back and has gone to ER on June 1st to ER because of the severe left arm pain that started earlier that day. He did have EMS called for him at first and they did not think this was heart related and later that night is when he went to ER because he was still hurting. HIs EKG and labs looked good, he was told that his symptoms are probably from a pinched nerve that was causing the symptoms. Patient was placed on a patch by pain clinic and he asked them what would be the next step and was told nothing-no higher dose available. Patient just feels frustrated and the pain is bad. BP Readings from Last 3 Encounters:  01/08/16 140/84  01/03/16 129/88  11/07/15 136/72    Prior to Admission medications   Medication Sig Start Date End Date Taking? Authorizing Provider  aspirin 81 MG tablet Take 81 mg by mouth daily.    Historical Provider, MD  atorvastatin (LIPITOR) 10 MG tablet TAKE 1 TABLET BY MOUTH AT BEDTIME 09/16/15   Jerrol Banana., MD  baclofen (LIORESAL) 10 MG tablet Reported on 11/07/2015 10/20/15   Historical Provider, MD  Buprenorphine HCl (BELBUCA) 300 MCG FILM Place inside cheek. 10/25/15   Historical Provider, MD  carisoprodol (SOMA) 350 MG tablet TAKE 1 TABLET BY MOUTH 3 TIMES A DAY AS NEEDED FOR HEADACHES 09/24/15   Jerrol Banana., MD  CIALIS 5 MG tablet TAKE 1 TABLET BY MOUTH DAILY AS NEEDED 09/30/15   Jerrol Banana., MD  glucose blood [facility] VERIO) test strip Use as instructed 08/14/15   Jerrol Banana., MD  hydrochlorothiazide (HYDRODIURIL) 25 MG tablet Take 1 tablet (25 mg total) by mouth daily. 05/14/15   Jerrol Banana., MD  KLOR-CON M20 20 MEQ tablet TAKE 1  TABLET BY MOUTH ONCE A DAY 02/12/15   Daryn Hicks Maceo Pro., MD  losartan (COZAAR) 100 MG tablet Take 1 tablet (100 mg total) by mouth daily. 01/15/15   Rosabel Sermeno Maceo Pro., MD  LYRICA 100 MG capsule Take 100 mg by mouth 3 (three) times daily. 02/05/15   Historical Provider, MD  Melatonin CR 3 MG TBCR Take by mouth.    Historical Provider, MD  metFORMIN (GLUCOPHAGE) 1000 MG tablet TAKE 1 TABLET BY MOUTH TWICE A DAY WITH A MEAL 01/06/16   Jerrol Banana., MD  Oxycodone HCl 10 MG TABS Take by mouth. 09/03/11   Historical Provider, MD  SAVELLA 25 MG TABS  07/05/15   Historical Provider, MD  Venlafaxine HCl 150 MG TB24 TAKE 1 TABLET BY MOUTH EVERY DAY 05/20/15   Jerrol Banana., MD    Patient Active Problem List   Diagnosis Date Noted  . Abnormal kidney function 12/06/2014  . Cervical nerve root disorder 12/06/2014  . Colon polyp 12/06/2014  . Clinical depression 12/06/2014  . Essential (primary) hypertension 12/06/2014  . Cephalalgia 12/06/2014  . Bulge of cervical disc without myelopathy 12/06/2014  . HLD (hyperlipidemia) 12/06/2014  . 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 Displacement of lumbar intervertebral disc without myelopathy 12/06/2014  . L-S radiculopathy 12/06/2014  . Lyme disease  12/06/2014  . Mild major depression ([place]) 12/06/2014  . Neuropathy ([place]) 12/06/2014  . Adiposity 12/06/2014  . Peripheral vascular disease ([place]) 12/06/2014  . Diabetes mellitus, type 2 ([place]) 12/06/2014  . Cervical post-laminectomy syndrome 10/04/2013  . Cervical pain 10/04/2013  . Peripheral neuropathic pain ([place]) 10/04/2013  . Failed back syndrome of thoracic spine 10/04/2013  . Fatigue 03/17/2013  . Hypokalemia 01/30/2013  . Positive D dimer 01/30/2013  . Chest pain 01/28/2013  . Seizures ([place]) 01/28/2013  . Syncope 01/28/2013  . Chronic pain associated with significant psychosocial dysfunction 08/17/2012  . Polypharmacy 08/02/2012  . 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 Post laminectomy  syndrome 08/02/2012  . Thoracic spinal stenosis 06/22/2011    Past Medical History  Diagnosis Date  . Hypertension   . Tuberculosis     POSITIVE  TB SKIN TEST 1992.6 MTH TX   . Neuromuscular disorder ([place])     "back nerve stimulator"  . Diabetes mellitus without complication ([place])   . Hyperlipidemia   . PAD (peripheral artery disease) ([place])   . Fatigue   . Hypogonadism male   . Neuropathy [facility])     Social History   Social History  . Marital Status: Married    Spouse Name: N/A  . Number of Children: N/A  . Years of Education: N/A   Occupational History  . works at [employer]   . [place] History Main Topics  . Smoking status: Former Smoker -- 1.50 packs/day for 30 years    Types: Cigarettes    Quit date: 07/08/1995  . Smokeless tobacco: Former Systems developer    Quit date: 12/16/1995  . Alcohol Use: 0.6 oz/week    1 Standard drinks or equivalent per week     Comment: Beer  . Drug Use: No  . Sexual Activity: Yes    Birth Control/ Protection: None   Other Topics Concern  . 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    Allergies  Allergen Reactions  . Cymbalta [Duloxetine Hcl] Other (See Comments)    "jittery" and "loopy" per pt  . Duloxetine Other (See Comments)  . Gabapentin Other (See Comments)    caused severe tremor "jittery" and "loopy" per pt.  Recardo Evangelist [Pregabalin] Swelling  . Topamax  [Topiramate] Other (See Comments)    unresponsive  episodes  . Keppra [Levetiracetam] Other (See Comments) and Palpitations    "jittery", and "loopy." per pt.    Review of Systems  Respiratory: Negative.   Cardiovascular: Negative.   Musculoskeletal: Positive for myalgias, back pain, joint pain and neck pain.  Neurological: Positive for weakness.  Psychiatric/Behavioral: Positive for depression. The patient is nervous/anxious and has insomnia.     Immunization History  Administered  Date(s) Administered  . Influenza,inj,Quad PF,36+ Mos 05/14/2015  . Pneumococcal Polysaccharide-23 01/21/2012  . Tdap 01/21/2012   Objective:  BP 140/84 mmHg  Pulse 72  Resp 16  Wt 225 lb (102.059 kg)  Physical Exam  Lab Results  Component Value Date   WBC 7.8 01/03/2016   HGB 15.0 01/03/2016   HCT 42.4 01/03/2016   PLT 213 01/03/2016   GLUCOSE 112* 01/03/2016   CHOL 133 01/21/2015   TRIG 136 01/21/2015   HDL 29* 01/21/2015   LDLCALC 77 01/21/2015   TSH 0.985 01/21/2015   PSA 0.9 09/19/2014   INR 0.93 06/16/2011   HGBA1C 5.9 11/07/2015    CMP  Component Value Date/Time   NA 138 01/03/2016 0035   NA 131* 07/17/2015 0910   K 3.7 01/03/2016 0035   K 4.1 12/28/2012 1508   CL 101 01/03/2016 0035   CO2 24 01/03/2016 0035   GLUCOSE 112* 01/03/2016 0035   GLUCOSE 513* 07/17/2015 0910   BUN 16 01/03/2016 0035   BUN 16 07/17/2015 0910   CREATININE 0.85 01/03/2016 0035   CREATININE 0.9 09/19/2014   CALCIUM 8.3* 01/03/2016 0035   PROT 7.3 01/21/2015 0823   PROT 6.0 01/30/2013 0455   ALBUMIN 4.5 07/17/2015 0910   ALBUMIN 2.9* 01/30/2013 0455   AST 16 01/21/2015 0823   ALT 22 01/21/2015 0823   ALKPHOS 120* 01/21/2015 0823   BILITOT 0.7 01/21/2015 0823   BILITOT 0.2* 01/30/2013 0455   GFRNONAA >60 01/03/2016 0035   GFRAA >60 01/03/2016 0035    Assessment and Plan :  1. Essential (primary) hypertension  metoprolol succinate (TOPROL-XL) 25 MG 24 hr tablet; Take 1 tablet (25 mg total) by mouth daily.  Dispense: 30 tablet; Refill: 12  2. L-S radiculopathy - Ambulatory referral to Pain Clinic/dr Crisp. More than 50% of time was spent in counseling and discussing the options the  patient has at this time 3. Bulge of cervical disc without myelopathy - Ambulatory referral to Pain Clinic 4. Prediabetes 5. Depression In remission presently. The chronic pain certainly makes it worse.  Miguel Aschoff MD [facility] 01/08/2016  4:32 PM

## 2016-01-21 ENCOUNTER — Telehealth: Payer: Self-pay

## 2016-01-21 NOTE — Telephone Encounter (Signed)
Pt is checking on his referral to the pain clinic.  He has not heard back yet about an appointment.  Please advise.  Thanks,   -Mickel Baas

## 2016-01-21 NOTE — Telephone Encounter (Signed)
Please let him know the way this is done.-aa

## 2016-01-23 NOTE — Telephone Encounter (Signed)
[facility] pain clinic states their office will contact pt today 01/23/16

## 2016-02-05 ENCOUNTER — Other Ambulatory Visit: Payer: Self-pay

## 2016-02-05 DIAGNOSIS — I10 Essential (primary) hypertension: Secondary | ICD-10-CM

## 2016-02-05 MED ORDER — LOSARTAN POTASSIUM 100 MG PO TABS: 100.0000 mg | ORAL_TABLET | Freq: Every day | ORAL | Status: DC

## 2016-02-18 ENCOUNTER — Ambulatory Visit (INDEPENDENT_AMBULATORY_CARE_PROVIDER_SITE_OTHER): Payer: 59 | Admitting: Family Medicine

## 2016-02-18 VITALS — BP 158/94 | HR 64 | Temp 97.8°F | Resp 16 | Wt 219.0 lb

## 2016-02-18 DIAGNOSIS — E785 Hyperlipidemia, unspecified: Secondary | ICD-10-CM | POA: Diagnosis not present

## 2016-02-18 DIAGNOSIS — I10 Essential (primary) hypertension: Secondary | ICD-10-CM | POA: Diagnosis not present

## 2016-02-18 DIAGNOSIS — E119 Type 2 diabetes mellitus without complications: Secondary | ICD-10-CM | POA: Diagnosis not present

## 2016-02-18 DIAGNOSIS — G4700 Insomnia, unspecified: Secondary | ICD-10-CM | POA: Diagnosis not present

## 2016-02-18 DIAGNOSIS — M5417 Radiculopathy, lumbosacral region: Secondary | ICD-10-CM | POA: Diagnosis not present

## 2016-02-18 DIAGNOSIS — F320 Major depressive disorder, single episode, mild: Secondary | ICD-10-CM | POA: Diagnosis not present

## 2016-02-18 MED ORDER — ZOLPIDEM TARTRATE 10 MG PO TABS: 10.0000 mg | ORAL_TABLET | Freq: Every evening | ORAL | Status: DC | PRN

## 2016-02-18 MED ORDER — CYCLOBENZAPRINE HCL 10 MG PO TABS: 10.0000 mg | ORAL_TABLET | Freq: Three times a day (TID) | ORAL | Status: DC | PRN

## 2016-02-18 NOTE — Progress Notes (Signed)
Subjective:  HPI  Patient is having hard time sleeping-falling and staying asleep and has had hard time since his wife passed away. He has also struggled with neck pain, back pain and arm pain. He has appointment with Dr .Primus Bravo on July 25th. His wife had type 1 diabetes and died suddenly from heart disease about a week ago. They were very close. He is not suicidal. Depression screen [facility] 2/9 02/18/2016 01/15/2015  Decreased Interest 1 1  Down, Depressed, Hopeless 3 1  PHQ - 2 Score 4 2  Altered sleeping 3 2  Tired, decreased energy 3 3  Change in appetite 3 2  Feeling bad or failure about yourself  0 2  Trouble concentrating 1 1  Moving slowly or fidgety/restless 1 2  Suicidal thoughts 0 0  PHQ-9 Score 15 14  Difficult doing work/chores Very difficult -      Prior to Admission medications   Medication Sig Start Date End Date Taking? Authorizing Provider  aspirin 81 MG tablet Take 81 mg by mouth daily.   Yes Historical Provider, MD  atorvastatin (LIPITOR) 10 MG tablet TAKE 1 TABLET BY MOUTH AT BEDTIME 09/16/15  Yes Richard Maceo Pro., MD  BELBUCA 450 MCG FILM PLACE 1 FILM (450MCG) INSIDE CHEEK EVERY 12 HOURS 12/24/15  Yes Historical Provider, MD  carisoprodol (SOMA) 350 MG tablet TAKE 1 TABLET BY MOUTH 3 TIMES A DAY AS NEEDED FOR HEADACHES 09/24/15  Yes Richard Maceo Pro., MD  CIALIS 5 MG tablet TAKE 1 TABLET BY MOUTH DAILY AS NEEDED 09/30/15  Yes Jerrol Banana., MD  glucose blood [facility] VERIO) test strip Use as instructed 08/14/15  Yes Richard Maceo Pro., MD  hydrochlorothiazide (HYDRODIURIL) 25 MG tablet Take 1 tablet (25 mg total) by mouth daily. 05/14/15  Yes Richard Maceo Pro., MD  KLOR-CON M20 20 MEQ tablet TAKE 1 TABLET BY MOUTH ONCE A DAY 02/12/15  Yes Richard Maceo Pro., MD  losartan (COZAAR) 100 MG tablet Take 1 tablet (100 mg total) by mouth daily. 02/05/16  Yes Richard Maceo Pro., MD  LYRICA 100 MG capsule Take 100 mg by mouth 3 (three) times daily.  02/05/15  Yes Historical Provider, MD  Melatonin CR 3 MG TBCR Take by mouth.   Yes Historical Provider, MD  metFORMIN (GLUCOPHAGE) 1000 MG tablet TAKE 1 TABLET BY MOUTH TWICE A DAY WITH A MEAL 01/06/16  Yes Jerrol Banana., MD  metoprolol succinate (TOPROL-XL) 25 MG 24 hr tablet Take 1 tablet (25 mg total) by mouth daily. 01/08/16  Yes Richard Maceo Pro., MD  Venlafaxine HCl 150 MG TB24 TAKE 1 TABLET BY MOUTH EVERY DAY 05/20/15  Yes Jerrol Banana., MD    Patient Active Problem List   Diagnosis Date Noted  . Abnormal kidney function 12/06/2014  . Cervical nerve root disorder 12/06/2014  . Colon polyp 12/06/2014  . Clinical depression 12/06/2014  . Essential (primary) hypertension 12/06/2014  . Cephalalgia 12/06/2014  . Bulge of cervical disc without myelopathy 12/06/2014  . HLD (hyperlipidemia) 12/06/2014  . 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 Displacement of lumbar intervertebral disc without myelopathy 12/06/2014  . L-S radiculopathy 12/06/2014  . Lyme disease 12/06/2014  . Mild major depression ([place]) 12/06/2014  . Neuropathy ([place]) 12/06/2014  . Adiposity 12/06/2014  . Peripheral vascular disease ([place]) 12/06/2014  . Diabetes mellitus, type 2 ([place]) 12/06/2014  . Cervical post-laminectomy syndrome 10/04/2013  . Cervical pain 10/04/2013  . Peripheral neuropathic pain (  [place]) 10/04/2013  . Failed back syndrome of thoracic spine 10/04/2013  . Fatigue 03/17/2013  . Hypokalemia 01/30/2013  . Positive D dimer 01/30/2013  . Chest pain 01/28/2013  . Seizures ([place]) 01/28/2013  . Syncope 01/28/2013  . Chronic pain associated with significant psychosocial dysfunction 08/17/2012  . Polypharmacy 08/02/2012  . 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 Post laminectomy syndrome 08/02/2012  . Thoracic spinal stenosis 06/22/2011    Past Medical History  Diagnosis Date  . Hypertension   . Tuberculosis     POSITIVE  TB SKIN TEST 1992.6 MTH TX   . Neuromuscular disorder ([place])     "back nerve  stimulator"  . Diabetes mellitus without complication ([place])   . Hyperlipidemia   . PAD (peripheral artery disease) ([place])   . Fatigue   . Hypogonadism male   . Neuropathy [facility])     Social History   Social History  . Marital Status: Married    Spouse Name: N/A  . Number of Children: N/A  . Years of Education: N/A   Occupational History  . works at [employer]   . [place] History Main Topics  . Smoking status: Former Smoker -- 1.50 packs/day for 30 years    Types: Cigarettes    Quit date: 07/08/1995  . Smokeless tobacco: Former Systems developer    Quit date: 12/16/1995  . Alcohol Use: 0.6 oz/week    1 Standard drinks or equivalent per week     Comment: Beer  . Drug Use: No  . Sexual Activity: Yes    Birth Control/ Protection: None   Other Topics Concern  . 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    Allergies  Allergen Reactions  . Cymbalta [Duloxetine Hcl] Other (See Comments)    "jittery" and "loopy" per pt  . Duloxetine Other (See Comments)  . Gabapentin Other (See Comments)    caused severe tremor "jittery" and "loopy" per pt.  Recardo Evangelist [Pregabalin] Swelling  . Topamax  [Topiramate] Other (See Comments)    unresponsive  episodes  . Keppra [Levetiracetam] Other (See Comments) and Palpitations    "jittery", and "loopy." per pt.    Review of Systems  Constitutional: Positive for malaise/fatigue.  Eyes: Negative.   Respiratory: Negative.   Cardiovascular: Negative.   Gastrointestinal: Negative.   Musculoskeletal: Positive for myalgias, back pain, joint pain and neck pain.  Skin: Negative.   Neurological: Negative.   Endo/Heme/Allergies: Negative.   Psychiatric/Behavioral: Positive for depression. The patient is nervous/anxious and has insomnia.     Immunization History  Administered Date(s) Administered  . Influenza,inj,Quad PF,36+ Mos 05/14/2015  . Pneumococcal  Polysaccharide-23 01/21/2012  . Tdap 01/21/2012   Objective:  BP 158/94 mmHg  Pulse 64  Temp(Src) 97.8 F (36.6 C)  Resp 16  Wt 219 lb (99.338 kg)  Physical Exam  Constitutional: He is oriented to person, place, and time and well-developed, well-nourished, and in no distress.  HENT:  Head: Normocephalic and atraumatic.  Right Ear: External ear normal.  Left Ear: External ear normal.  Eyes: Conjunctivae are normal. Pupils are equal, round, and reactive to light.  Neck: Normal range of motion. Neck supple.  Cardiovascular: Normal rate, regular rhythm, normal heart sounds and intact distal pulses.   No murmur heard. Pulmonary/Chest: Effort normal and breath sounds normal. No respiratory distress. He has no wheezes.  Musculoskeletal: He exhibits no edema or tenderness.  Neurological: He  is alert and oriented to person, place, and time. Gait normal.  Skin: Skin is warm and dry.  Psychiatric: Memory, affect and judgment normal. He exhibits a depressed mood.    Lab Results  Component Value Date   WBC 7.8 01/03/2016   HGB 15.0 01/03/2016   HCT 42.4 01/03/2016   PLT 213 01/03/2016   GLUCOSE 112* 01/03/2016   CHOL 133 01/21/2015   TRIG 136 01/21/2015   HDL 29* 01/21/2015   LDLCALC 77 01/21/2015   TSH 0.985 01/21/2015   PSA 0.9 09/19/2014   INR 0.93 06/16/2011   HGBA1C 5.9 11/07/2015    CMP     Component Value Date/Time   NA 138 01/03/2016 0035   NA 131* 07/17/2015 0910   K 3.7 01/03/2016 0035   K 4.1 12/28/2012 1508   CL 101 01/03/2016 0035   CO2 24 01/03/2016 0035   GLUCOSE 112* 01/03/2016 0035   GLUCOSE 513* 07/17/2015 0910   BUN 16 01/03/2016 0035   BUN 16 07/17/2015 0910   CREATININE 0.85 01/03/2016 0035   CREATININE 0.9 09/19/2014   CALCIUM 8.3* 01/03/2016 0035   PROT 7.3 01/21/2015 0823   PROT 6.0 01/30/2013 0455   ALBUMIN 4.5 07/17/2015 0910   ALBUMIN 2.9* 01/30/2013 0455   AST 16 01/21/2015 0823   ALT 22 01/21/2015 0823   ALKPHOS 120* 01/21/2015 0823     BILITOT 0.7 01/21/2015 0823   BILITOT 0.2* 01/30/2013 0455   GFRNONAA >60 01/03/2016 0035   GFRAA >60 01/03/2016 0035    Assessment and Plan :  1. Insomnia Try Zolpidem. Mourning death of his wife. - zolpidem (AMBIEN) 10 MG tablet; Take 1 tablet (10 mg total) by mouth at bedtime as needed for sleep.  Dispense: 30 tablet; Refill: 5  2. Mild major depression ([place]) Mourning death of his wife. PHQ 9 score today is 15.  3. Type 2 diabetes mellitus without complication, without long-term current use of insulin ([place])  4. L-S radiculopathy tyr Flexeril. Keep appointment with Dr. Primus Bravo later this month. - cyclobenzaprine (FLEXERIL) 10 MG tablet; Take 1 tablet (10 mg total) by mouth 3 (three) times daily as needed for muscle spasms.  Dispense: 90 tablet; Refill: 5  5. Essential hypertension Elevated today. Due to patient's emotional state right now and chronic pain worsening with current situation will not make any medication changes at this time.  Patient was seen and examined by Dr. Eulas Post and note was scribed by Theressa Millard, RMA.    Miguel Aschoff MD [facility] 02/18/2016 11:09 AM

## 2016-02-19 ENCOUNTER — Other Ambulatory Visit: Payer: Self-pay | Admitting: Family Medicine

## 2016-02-20 LAB — COMPREHENSIVE METABOLIC PANEL
A/G RATIO: 1.4 (ref 1.2–2.2)
ALT: 21 IU/L (ref 0–44)
AST: 17 IU/L (ref 0–40)
Albumin: 4.3 g/dL (ref 3.5–5.5)
Alkaline Phosphatase: 80 IU/L (ref 39–117)
BUN/Creatinine Ratio: 16 (ref 9–20)
BUN: 16 mg/dL (ref 6–24)
Bilirubin Total: 0.6 mg/dL (ref 0.0–1.2)
CALCIUM: 9.2 mg/dL (ref 8.7–10.2)
CHLORIDE: 97 mmol/L (ref 96–106)
CO2: 27 mmol/L (ref 18–29)
Creatinine, Ser: 1 mg/dL (ref 0.76–1.27)
GFR, EST AFRICAN AMERICAN: 96 mL/min/{1.73_m2} (ref >59)
GFR, EST NON AFRICAN AMERICAN: 83 mL/min/{1.73_m2} (ref >59)
GLOBULIN, TOTAL: 3 g/dL (ref 1.5–4.5)
Glucose: 119 mg/dL — ABNORMAL HIGH (ref 65–99)
POTASSIUM: 4.7 mmol/L (ref 3.5–5.2)
Sodium: 140 mmol/L (ref 134–144)
TOTAL PROTEIN: 7.3 g/dL (ref 6.0–8.5)

## 2016-02-20 LAB — HEMOGLOBIN A1C
Est. average glucose Bld gHb Est-mCnc: 126 mg/dL
HEMOGLOBIN A1C: 6 % — AB (ref 4.8–5.6)

## 2016-02-20 LAB — TSH: TSH: 1.67 u[IU]/mL (ref 0.450–4.500)

## 2016-02-20 LAB — LIPID PANEL WITH LDL/HDL RATIO
Cholesterol, Total: 138 mg/dL (ref 100–199)
HDL: 28 mg/dL — ABNORMAL LOW (ref >39)
LDL Calculated: 83 mg/dL (ref 0–99)
LDL/HDL RATIO: 3 ratio (ref 0.0–3.6)
Triglycerides: 134 mg/dL (ref 0–149)
VLDL Cholesterol Cal: 27 mg/dL (ref 5–40)

## 2016-02-21 ENCOUNTER — Telehealth: Payer: Self-pay

## 2016-02-21 NOTE — Telephone Encounter (Signed)
-----   Message from Jerrol Banana., MD sent at 02/20/2016  9:57 AM EDT ----- Labs OK.

## 2016-02-21 NOTE — Telephone Encounter (Signed)
LMTCB 02/21/2016  Thanks,   -Laura  

## 2016-02-24 NOTE — Telephone Encounter (Signed)
Pt advised-aa 

## 2016-02-25 ENCOUNTER — Ambulatory Visit: Payer: 59 | Attending: Pain Medicine | Admitting: Pain Medicine

## 2016-02-25 ENCOUNTER — Encounter: Payer: Self-pay | Admitting: Pain Medicine

## 2016-02-25 VITALS — BP 147/86 | HR 74 | Temp 97.7°F | Resp 16 | Ht 70.0 in | Wt 219.0 lb

## 2016-02-25 DIAGNOSIS — M5136 Other intervertebral disc degeneration, lumbar region: Secondary | ICD-10-CM | POA: Diagnosis not present

## 2016-02-25 DIAGNOSIS — M5030 Other cervical disc degeneration, unspecified cervical region: Secondary | ICD-10-CM | POA: Diagnosis not present

## 2016-02-25 DIAGNOSIS — I10 Essential (primary) hypertension: Secondary | ICD-10-CM | POA: Diagnosis not present

## 2016-02-25 DIAGNOSIS — Z9889 Other specified postprocedural states: Secondary | ICD-10-CM | POA: Insufficient documentation

## 2016-02-25 DIAGNOSIS — I739 Peripheral vascular disease, unspecified: Secondary | ICD-10-CM | POA: Insufficient documentation

## 2016-02-25 DIAGNOSIS — M533 Sacrococcygeal disorders, not elsewhere classified: Secondary | ICD-10-CM | POA: Insufficient documentation

## 2016-02-25 DIAGNOSIS — M5481 Occipital neuralgia: Secondary | ICD-10-CM | POA: Diagnosis not present

## 2016-02-25 DIAGNOSIS — E1140 Type 2 diabetes mellitus with diabetic neuropathy, unspecified: Secondary | ICD-10-CM | POA: Diagnosis not present

## 2016-02-25 DIAGNOSIS — M545 Low back pain: Secondary | ICD-10-CM | POA: Diagnosis present

## 2016-02-25 DIAGNOSIS — F329 Major depressive disorder, single episode, unspecified: Secondary | ICD-10-CM | POA: Insufficient documentation

## 2016-02-25 DIAGNOSIS — M62830 Muscle spasm of back: Secondary | ICD-10-CM | POA: Insufficient documentation

## 2016-02-25 DIAGNOSIS — Z9689 Presence of other specified functional implants: Secondary | ICD-10-CM | POA: Insufficient documentation

## 2016-02-25 DIAGNOSIS — Z981 Arthrodesis status: Secondary | ICD-10-CM | POA: Insufficient documentation

## 2016-02-25 DIAGNOSIS — M542 Cervicalgia: Secondary | ICD-10-CM | POA: Diagnosis present

## 2016-02-25 DIAGNOSIS — I70209 Unspecified atherosclerosis of native arteries of extremities, unspecified extremity: Secondary | ICD-10-CM

## 2016-02-25 DIAGNOSIS — M47812 Spondylosis without myelopathy or radiculopathy, cervical region: Secondary | ICD-10-CM | POA: Insufficient documentation

## 2016-02-25 DIAGNOSIS — M47816 Spondylosis without myelopathy or radiculopathy, lumbar region: Secondary | ICD-10-CM

## 2016-02-25 NOTE — Progress Notes (Signed)
The patient is a 55 year old gentleman who comes to pain management at the request of Dr. Miguel Aschoff for further evaluation and treatment of pain involving the neck upper extremity regions and lumbar and lower extremity regions. The patient is undergone extensive evaluation and treatment at [facility] and has spinal cord stimulator for lower back and lower extremity pain according to patient's history. Stages undergone interventional treatment and at the present time he was unable to tolerate [facility] mucosal patch. The patient has undergone surgery of the cervical and lumbar regions by Dr. Arnoldo Morale. The patient has upper extremity weakness and lower extremity weakness. The patient stated that prior nerve conduction studies reveal patient to be with evidence of the disc oh nerves producing lower extremity pain paresthesias and weakness. The patient has taken various medications and was unable to tolerate Neurontin and presently is taking Lyrica. We discussed patient's overall condition and informed patient we will establish undergo reevaluation with Dr. Arnoldo Morale at this time. The patient stated the pain was constant cramping deep disabling exhausting sharp stabbing tingling sensation associated with weakness patient stated the pain awakened him as sleep and interfered with ability to go to sleep. The pain increased with walking standings with sitting stooping squatting. The patient denied any recent trauma change in events of daily living because no changes of the pathology. We will proceed with evaluation by Dr. Arnoldo Morale at this time and will consider modification of treatment regimen pending follow-up evaluation. The patient also is with history of diabetes mellitus as well as peripheral vascular disease which could be contributing to patient's symptomatology as well. We will await further evaluation of patient and consider modification of treatment regimen pending follow-up evaluation.  All agreed to suggested treatment plan.      Cardiovascular: High blood pressure  Pulmonary: Unremarkable  Neurological: Unremarkable  Psychological: Depression (patient recently lost his wife)  Gastrointestinal: Unremarkable  Genitourinary: Unremarkable  Hematologic: Unremarkable  Endocrine: Diabetes mellitus  Rheumatological: Unremarkable  Musculoskeletal: Unremarkable  Other significant: Unremarkable     Physical examination  There was tenderness of the paraspinal musculature in the cervical region cervical facet region with well-healed surgical scar of the cervical region without increased warmth and erythema in the region scar. There was tenderness of the acromioclavicular and glenohumeral joint regions. The patient was with decreased grip strength on the left compared to the right. There appeared to be unremarkable Spurling's maneuver. Palpation of the thoracic region was attends to palpation with well-healed surgical scar of the thoracic region noted as well. There was tenderness over the lumbar paraspinal must reason lumbar facet region with lateral bending rotation extension and palpation of the lumbar facets reproducing moderate discomfort. Patient was with evidence of generator placed subcutaneously paraspinal cord stimulator noted upon palpation of the thoracic oh lumbar region. There was tenderness over the lumbar paraspinal must reason lumbar facet region with lateral bending rotation extension and palpation of the lumbar facets reproducing moderate discomfort with moderate muscle spasm noted. There was mild to moderate tenderness over the PSIS and PII S region a mild tenderness of the greater trochanteric region iliotibial band region. The knees were tenderness to palpation with crepitus of the knees with negative anterior and posterior drawer signs without ballottement of the patella. Straight leg raise was tolerates approximately 30 without a definite  increased pain with dorsiflexion noted. EHL strength appeared to be decreased on the left compared to the right. There was negative clonus negative Homans. Abdomen nontender with no costovertebral  tenderness noted.     Assessment   Degenerative disc disease cervical spine  Status post surgery of the cervical region  Cervical facet syndrome  Degenerative disc disease lumbar spine  Status post surgery of the lumbar region  Lumbar facet syndrome  Status post spinal cord stimulator placement for lumbar lower extremity pain  Diabetes mellitus with diabetic neuropathy  Peripheral vascular disease  Sacroiliac joint dysfunction  Bilateral occipital neuralgia       Plan    PLAN  Continue present medications at this time  F/U PCP Dr. Miguel Aschoff for evaluation of  BP and general medical  condition  F/U surgical evaluation with Dr. Arnoldo Morale as discussed Please ask the nurses and secretary the date of your neurosurgical reevaluation with Dr. Arnoldo Morale  F/U neurological evaluation. May consider pending follow-up evaluations. We may consider updated [facility] EMG studies. According to you prior [facility] EMG studies showed that this was touching nerve going to both lower extremities. We will have you undergo neurosurgical reevaluation with Dr. Arnoldo Morale at this time as discussed  Cataract surgery as planned  May consider radiofrequency rhizolysis or intraspinal procedures pending response to present treatment and F/U evaluation   Patient to call [facility] should patient have concerns prior to scheduled return appointment.

## 2016-02-25 NOTE — Progress Notes (Signed)
New patient here for chronic pain.  Referred by Dr Rosanna Randy.  Patient had been being treated at a pain clinic in [facility] but they were not meeting his needs.  Patient reports having a spinal cord stimulator that was placed approx 2014.    Patient  Reports the death of his wife approx 1 week ago.  Safety precautions to be maintained throughout the outpatient stay will include: orient to surroundings, keep bed in low position, maintain call bell within reach at all times, provide assistance with transfer out of bed and ambulation.

## 2016-02-25 NOTE — Patient Instructions (Addendum)
PLAN  Continue present medications at this time  F/U PCP Dr. Miguel Aschoff for evaluation of  BP and general medical  condition  F/U surgical evaluation with Dr. Arnoldo Morale as discussed Please ask the nurses and secretary the date of your neurosurgical reevaluation with Dr. Arnoldo Morale  F/U neurological evaluation. May consider pending follow-up evaluations. We may consider updated [facility] EMG studies. According to you prior [facility] EMG studies showed that this was touching nerve going to both lower extremities. We will have you undergo neurosurgical reevaluation with Dr. Arnoldo Morale at this time as discussed  Cataract surgery as planned  May consider radiofrequency rhizolysis or intraspinal procedures pending response to present treatment and F/U evaluation   Patient to call Pain Management Center should patient have concerns prior to scheduled return appointment. Occipital Nerve Block Patient Information  Description: The occipital nerves originate in the cervical (neck) spinal cord and travel upward through muscle and tissue to supply sensation to the back of the head and top of the scalp.  In addition, the nerves control some of the muscles of the scalp.  Occipital neuralgia is an irritation of these nerves which can cause headaches, numbness of the scalp, and neck discomfort.     The occipital nerve block will interrupt nerve transmission through these nerves and can relieve pain and spasm.  The block consists of insertion of a small needle under the skin in the back of the head to deposit local anesthetic (numbing medicine) and/or steroids around the nerve.  The entire block usually lasts less than 5 minutes.  Conditions which may be treated by occipital blocks:   Muscular pain and spasm of the scalp  Nerve irritation, back of the head  Headaches  Upper neck pain  Preparation for the injection:  1. Do not eat any solid food or dairy products within 8 hours of your appointment. 2. You may  drink clear liquids up to 3 hours before appointment.  Clear liquids include water, black coffee, juice or soda.  No milk or cream please. 3. You may take your regular medication, including pain medications, with a sip of water before you appointment.  Diabetics should hold regular insulin (if taken separately) and take 1/2 normal NPH dose the morning of the procedure.  Carry some sugar containing items with you to your appointment. 4. A driver must accompany you and be prepared to drive you home after your procedure. 5. Bring all your current medications with you. 6. An IV may be inserted and sedation may be given at the discretion of the physician. 7. A blood pressure cuff, EKG, and other monitors will often be applied during the procedure.  Some patients may need to have extra oxygen administered for a short period. 8. You will be asked to provide medical information, including your allergies and medications, prior to the procedure.  We must know immediately if you are taking blood thinners (like Coumadin/Warfarin) or if you are allergic to IV iodine contrast (dye).  We must know if you could possible be pregnant.  9. Do not wear a high collared shirt or turtleneck.  Tie long hair up in the back if possible.  Possible side-effects:   Bleeding from needle site  Infection (rare, may require surgery)  Nerve injury (rare)  Hair on back of neck can be tinged with iodine scrub (this will wash out)  Light-headedness (temporary)  Pain at injection site (several days)  Decreased blood pressure (rare, temporary)  Seizure (very rare)  Call if you  experience:   Hives or difficulty breathing ( go to the emergency room)  Inflammation or drainage at the injection site(s)  Please note:  Although the local anesthetic injected can often make your painful muscles or headache feel good for several hours after the injection, the pain may return.  It takes 3-7 days for steroids to work.  You may  not notice any pain relief for at least one week.  If effective, we will often do a series of injections spaced 3-6 weeks apart to maximally decrease your pain.  If you have any questions, please call [phone] [place]  What are the risk, side effects and possible complications? Generally speaking, most procedures are safe.  However, with any procedure there are risks, side effects, and the possibility of complications.  The risks and complications are dependent upon the sites that are lesioned, or the type of nerve block to be performed.  The closer the procedure is to the spine, the more serious the risks are.  Great care is taken when placing the radio frequency needles, block needles or lesioning probes, but sometimes complications can occur. 1. Infection: Any time there is an injection through the skin, there is a risk of infection.  This is why sterile conditions are used for these blocks.  There are four possible types of infection. 1. Localized skin infection. 2. Central Nervous System Infection-This can be in the form of Meningitis, which can be deadly. 3. Epidural Infections-This can be in the form of an epidural abscess, which can cause pressure inside of the spine, causing compression of the spinal cord with subsequent paralysis. This would require an emergency surgery to decompress, and there are no guarantees that the patient would recover from the paralysis. 4. Discitis-This is an infection of the intervertebral discs.  It occurs in about 1% of discography procedures.  It is difficult to treat and it may lead to surgery.        2. Pain: the needles have to go through skin and soft tissues, will cause soreness.       3. Damage to internal structures:  The nerves to be lesioned may be near blood vessels or    other nerves which can be potentially damaged.       4. Bleeding: Bleeding is more common if the patient  is taking blood thinners such as  aspirin, Coumadin, Ticiid, Plavix, etc., or if he/she have some genetic predisposition  such as hemophilia. Bleeding into the spinal canal can cause compression of the spinal  cord with subsequent paralysis.  This would require an emergency surgery to  decompress and there are no guarantees that the patient would recover from the  paralysis.       5. Pneumothorax:  Puncturing of a lung is a possibility, every time a needle is introduced in  the area of the chest or upper back.  Pneumothorax refers to free air around the  collapsed lung(s), inside of the thoracic cavity (chest cavity).  Another two possible  complications related to a similar event would include: Hemothorax and Chylothorax.   These are variations of the Pneumothorax, where instead of air around the collapsed  lung(s), you may have blood or chyle, respectively.       6. Spinal headaches: They may occur with any procedures in the area of the spine.       7. Persistent CSF (Cerebro-Spinal Fluid) leakage: This is a rare problem,  but may occur  with prolonged intrathecal or epidural catheters either due to the formation of a fistulous  track or a dural tear.       8. Nerve damage: By working so close to the spinal cord, there is always a possibility of  nerve damage, which could be as serious as a permanent spinal cord injury with  paralysis.       9. Death:  Although rare, severe deadly allergic reactions known as "Anaphylactic  reaction" can occur to any of the medications used.      10. Worsening of the symptoms:  We can always make thing worse.  What are the chances of something like this happening? Chances of any of this occuring are extremely low.  By statistics, you have more of a chance of getting killed in a motor vehicle accident: while driving to the hospital than any of the above occurring .  Nevertheless, you should be aware that they are possibilities.  In general, it is similar to taking a shower.   Everybody knows that you can slip, hit your head and get killed.  Does that mean that you should not shower again?  Nevertheless always keep in mind that statistics do not mean anything if you happen to be on the wrong side of them.  Even if a procedure has a 1 (one) in a 1,000,000 (million) chance of going wrong, it you happen to be that one..Also, keep in mind that by statistics, you have more of a chance of having something go wrong when taking medications.  Who should not have this procedure? If you are on a blood thinning medication (e.g. Coumadin, Plavix, see list of "Blood Thinners"), or if you have an active infection going on, you should not have the procedure.  If you are taking any blood thinners, please inform your physician.  How should I prepare for this procedure?  Do not eat or drink anything at least six hours prior to the procedure.  Bring a driver with you .  It cannot be a taxi.  Come accompanied by an adult that can drive you back, and that is strong enough to help you if your legs get weak or numb from the local anesthetic.  Take all of your medicines the morning of the procedure with just enough water to swallow them.  If you have diabetes, make sure that you are scheduled to have your procedure done first thing in the morning, whenever possible.  If you have diabetes, take only half of your insulin dose and notify our nurse that you have done so as soon as you arrive at the clinic.  If you are diabetic, but only take blood sugar pills (oral hypoglycemic), then do not take them on the morning of your procedure.  You may take them after you have had the procedure.  Do not take aspirin or any aspirin-containing medications, at least eleven (11) days prior to the procedure.  They may prolong bleeding.  Wear loose fitting clothing that may be easy to take off and that you would not mind if it got stained with Betadine or blood.  Do not wear any jewelry or perfume  Remove  any nail coloring.  It will interfere with some of our monitoring equipment.  NOTE: Remember that this is not meant to be interpreted as a complete list of all possible complications.  Unforeseen problems may occur.  BLOOD THINNERS The following drugs contain aspirin or other products, which can cause  increased bleeding during surgery and should not be taken for 2 weeks prior to and 1 week after surgery.  If you should need take something for relief of minor pain, you may take acetaminophen which is found in Tylenol,m Datril, Anacin-3 and Panadol. It is not blood thinner. The products listed below are.  Do not take any of the products listed below in addition to any listed on your instruction sheet.  A.P.C or A.P.C with Codeine Codeine Phosphate Capsules #3 Ibuprofen Ridaura  ABC compound Congesprin Imuran rimadil  Advil Cope Indocin Robaxisal  Alka-Seltzer Effervescent Pain Reliever and Antacid Coricidin or Coricidin-D  Indomethacin Rufen  Alka-Seltzer plus Cold Medicine Cosprin Ketoprofen S-A-C Tablets  Anacin Analgesic Tablets or Capsules Coumadin Korlgesic Salflex  Anacin Extra Strength Analgesic tablets or capsules CP-2 Tablets Lanoril Salicylate  Anaprox Cuprimine Capsules Levenox Salocol  Anexsia-D Dalteparin Magan Salsalate  Anodynos Darvon compound Magnesium Salicylate Sine-off  Ansaid Dasin Capsules Magsal Sodium Salicylate  Anturane Depen Capsules Marnal Soma  APF Arthritis pain formula Dewitt's Pills Measurin Stanback  Argesic Dia-Gesic Meclofenamic Sulfinpyrazone  Arthritis Bayer Timed Release Aspirin Diclofenac Meclomen Sulindac  Arthritis pain formula Anacin Dicumarol Medipren Supac  Analgesic (Safety coated) Arthralgen Diffunasal Mefanamic Suprofen  Arthritis Strength Bufferin Dihydrocodeine Mepro Compound Suprol  Arthropan liquid Dopirydamole Methcarbomol with Aspirin Synalgos  ASA tablets/Enseals Disalcid Micrainin Tagament  Ascriptin Doan's Midol Talwin  Ascriptin A/D  Dolene Mobidin Tanderil  Ascriptin Extra Strength Dolobid Moblgesic Ticlid  Ascriptin with Codeine Doloprin or Doloprin with Codeine Momentum Tolectin  Asperbuf Duoprin Mono-gesic Trendar  Aspergum Duradyne Motrin or Motrin IB Triminicin  Aspirin plain, buffered or enteric coated Durasal Myochrisine Trigesic  Aspirin Suppositories Easprin Nalfon Trillsate  Aspirin with Codeine Ecotrin Regular or Extra Strength Naprosyn Uracel  Atromid-S Efficin Naproxen Ursinus  Auranofin Capsules Elmiron Neocylate Vanquish  Axotal Emagrin Norgesic Verin  Azathioprine Empirin or Empirin with Codeine Normiflo Vitamin E  Azolid Emprazil Nuprin Voltaren  Bayer Aspirin plain, buffered or children's or timed BC Tablets or powders Encaprin Orgaran Warfarin Sodium  Buff-a-Comp Enoxaparin Orudis Zorpin  Buff-a-Comp with Codeine Equegesic Os-Cal-Gesic   Buffaprin Excedrin plain, buffered or Extra Strength Oxalid   Bufferin Arthritis Strength Feldene Oxphenbutazone   Bufferin plain or Extra Strength Feldene Capsules Oxycodone with Aspirin   Bufferin with Codeine Fenoprofen Fenoprofen Pabalate or Pabalate-SF   Buffets II Flogesic Panagesic   Buffinol plain or Extra Strength Florinal or Florinal with Codeine Panwarfarin   Buf-Tabs Flurbiprofen Penicillamine   Butalbital Compound Four-way cold tablets Penicillin   Butazolidin Fragmin Pepto-Bismol   Carbenicillin Geminisyn Percodan   Carna Arthritis Reliever Geopen Persantine   Carprofen Gold's salt Persistin   Chloramphenicol Goody's Phenylbutazone   Chloromycetin Haltrain Piroxlcam   Clmetidine heparin Plaquenil   Cllnoril Hyco-pap Ponstel   Clofibrate Hydroxy chloroquine Propoxyphen         Before stopping any of these medications, be sure to consult the physician who ordered them.  Some, such as Coumadin (Warfarin) are ordered to prevent or treat serious conditions such as "deep thrombosis", "pumonary embolisms", and other heart problems.  The amount of time  that you may need off of the medication may also vary with the medication and the reason for which you were taking it.  If you are taking any of these medications, please make sure you notify your pain physician before you undergo any procedures.

## 2016-03-04 ENCOUNTER — Encounter: Payer: Self-pay | Admitting: *Deleted

## 2016-03-05 LAB — TOXASSURE SELECT 13 (MW), URINE

## 2016-03-05 NOTE — Progress Notes (Signed)
Reviewed

## 2016-03-11 ENCOUNTER — Encounter: Payer: Self-pay | Admitting: Family Medicine

## 2016-03-11 ENCOUNTER — Ambulatory Visit (INDEPENDENT_AMBULATORY_CARE_PROVIDER_SITE_OTHER): Payer: 59 | Admitting: Family Medicine

## 2016-03-11 VITALS — BP 126/80 | HR 76 | Temp 97.8°F | Resp 14 | Ht 70.0 in | Wt 223.0 lb

## 2016-03-11 DIAGNOSIS — Z0000 Encounter for general adult medical examination without abnormal findings: Secondary | ICD-10-CM

## 2016-03-11 DIAGNOSIS — E119 Type 2 diabetes mellitus without complications: Secondary | ICD-10-CM

## 2016-03-11 LAB — POCT URINALYSIS DIPSTICK
BILIRUBIN UA: NEGATIVE
Blood, UA: NEGATIVE
GLUCOSE UA: NEGATIVE
KETONES UA: NEGATIVE
LEUKOCYTES UA: NEGATIVE
Nitrite, UA: NEGATIVE
Protein, UA: NEGATIVE
Spec Grav, UA: 1.015
Urobilinogen, UA: 0.2
pH, UA: 5

## 2016-03-11 LAB — POCT UA - MICROALBUMIN: Microalbumin Ur, POC: NEGATIVE mg/L

## 2016-03-11 MED ORDER — PREGABALIN 150 MG PO CAPS: 150.0000 mg | ORAL_CAPSULE | Freq: Three times a day (TID) | ORAL | 5 refills | Status: DC

## 2016-03-11 NOTE — Progress Notes (Signed)
Patient: Jeff [name], Male    DOB: [birth year], 55 y.o.   MRN: [id number] Visit Date: 03/11/2016  Today's Provider: [name], MD   Chief Complaint  Patient presents with  . Annual Exam   Subjective:    Annual physical exam Jeff [name] is a 55 y.o. male who presents today for health maintenance and complete physical. He feels fairly well. He reports he is not exercising but is staying active. He reports he is sleeping poorly. Pt reports that he is still dealing with with death of his wife and taking it a day at a time. He feels he is coping with this normally.  ----------------------------------------------------------------- Colonoscopy- about 2-3 years ago. Per pt normal. Right cataract surgery 2014, for left cataract surgery tomorrow. Immunization History  Administered Date(s) Administered  . Influenza,inj,Quad PF,36+ Mos 05/14/2015  . Pneumococcal Polysaccharide-23 01/21/2012  . Tdap 01/21/2012     Review of Systems  Constitutional: Negative.   HENT: Positive for tinnitus.   Eyes: Positive for discharge and visual disturbance.  Respiratory: Positive for apnea.   Cardiovascular: Negative.   Gastrointestinal: Negative.   Endocrine: Negative.   Genitourinary: Negative.   Musculoskeletal: Positive for back pain, neck pain and neck stiffness.  Skin: Negative.   Allergic/Immunologic: Negative.   Neurological: Positive for headaches.  Hematological: Negative.   Psychiatric/Behavioral: Negative.     Social History      He  reports that he quit smoking about 20 years ago. His smoking use included Cigarettes. He has a 45.00 pack-year smoking history. He quit smokeless tobacco use about 20 years ago. He reports that he does not drink alcohol or use drugs.       Social History   Social History  . Marital status: Widowed    Spouse name: N/A  . Number of children: N/A  . Years of education: N/A   Occupational History  . works at [employer]   . [place] History Main Topics  . Smoking status: Former Smoker    Packs/day: 1.50    Years: 30.00    Types: Cigarettes    Quit date: 07/08/1995  . Smokeless tobacco: Former Systems developer    Quit date: 12/16/1995  . Alcohol use No     Comment: Beer  . Drug use: No  . Sexual activity: Yes    Birth control/ protection: None   Other Topics Concern  . 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    Past Medical History:  Diagnosis Date  . Cancer ([place])    SKIN  . Depression   . Diabetes mellitus without complication ([place])   . Fatigue   . Hyperlipidemia   . Hypertension   . Hypogonadism male   . Neuromuscular disorder ([place])    "back nerve stimulator"  . Neuropathy ([place])   . PAD (peripheral artery disease) ([place])   . Sleep apnea    CPAP  . Tinnitus   . Tuberculosis    POSITIVE  TB SKIN TEST 1992.6 MTH TX      Patient Active Problem List   Diagnosis Date Noted  . DDD (degenerative disc disease), cervical 02/25/2016  . H/O cervical spine surgery 02/25/2016  . Cervical facet syndrome 02/25/2016  . DDD (degenerative disc disease), lumbar 02/25/2016  . Facet syndrome, lumbar 02/25/2016  . Sacroiliac joint dysfunction 02/25/2016  . Atherosclerotic peripheral vascular disease ([place]) 02/25/2016  .  Abnormal kidney function 12/06/2014  . Cervical nerve root disorder 12/06/2014  . Colon polyp 12/06/2014  . Clinical depression 12/06/2014  . Essential (primary) hypertension 12/06/2014  . Cephalalgia 12/06/2014  . Bulge of cervical disc without myelopathy 12/06/2014  . HLD (hyperlipidemia) 12/06/2014  . 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 Displacement of lumbar intervertebral disc without myelopathy 12/06/2014  . L-S radiculopathy 12/06/2014  . Lyme disease 12/06/2014  . Mild major depression ([place]) 12/06/2014  . Neuropathy ([place]) 12/06/2014  . Adiposity 12/06/2014  . Peripheral vascular disease ([place]) 12/06/2014  .  Diabetes mellitus, type 2 ([place]) 12/06/2014  . Cervical post-laminectomy syndrome 10/04/2013  . Cervical pain 10/04/2013  . Peripheral neuropathic pain ([place]) 10/04/2013  . Failed back syndrome of thoracic spine 10/04/2013  . Fatigue 03/17/2013  . Hypokalemia 01/30/2013  . Positive D dimer 01/30/2013  . Chest pain 01/28/2013  . Seizures ([place]) 01/28/2013  . Syncope 01/28/2013  . Chronic pain associated with significant psychosocial dysfunction 08/17/2012  . Polypharmacy 08/02/2012  . 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 Post laminectomy syndrome 08/02/2012  . Thoracic spinal stenosis 06/22/2011    Past Surgical History:  Procedure Laterality Date  . BACK SURGERY    . CARDIAC CATHETERIZATION  01/31/2013   Medical management  . CARPAL TUNNEL RELEASE    . CERVICAL FUSION    . CORONARY ANGIOPLASTY    . KNEE ARTHROSCOPY    . LEFT HEART CATHETERIZATION WITH CORONARY ANGIOGRAM N/A 01/31/2013   Procedure: LEFT HEART CATHETERIZATION WITH CORONARY ANGIOGRAM;  Surgeon: Minus Breeding, MD;  Location: [facility] C[facility] LAB;  Service: Cardiovascular;  Laterality: N/A;  . LUMBAR LAMINECTOMY/DECOMPRESSION MICRODISCECTOMY  06/22/2011   Procedure: LUMBAR LAMINECTOMY/DECOMPRESSION MICRODISCECTOMY;  Surgeon: Ophelia Charter;  Location: [facility] NEURO ORS;  Service: Neurosurgery;  Laterality: N/A;  Thoracic Ten-Eleven,Thoracic Eleven-Twelve Laminectomy  . VASECTOMY  1991    Family History        Family Status  Relation Status  . Father Alive  . Mother Deceased  . Daughter Alive  . Sister Alive  . Son Alive  . Sister Alive  . Maternal Uncle   . Maternal Grandmother         His family history includes Atrial fibrillation in his mother; Cancer in his maternal grandmother and maternal uncle; Coronary artery disease in his father; Diabetes in his father; Heart disease in his father; Hyperlipidemia in his father; Hypertension in his father and mother; Migraines in his daughter; Transient ischemic attack in his  mother.    Allergies  Allergen Reactions  . Cymbalta [Duloxetine Hcl] Other (See Comments)    "jittery" and "loopy" per pt  . Gabapentin Other (See Comments)    caused severe tremor "jittery" and "loopy" per pt.  . Topamax  [Topiramate] Other (See Comments)    unresponsive  episodes  . Keppra [Levetiracetam] Other (See Comments) and Palpitations    "jittery", and "loopy." per pt.    Current Meds  Medication Sig  . aspirin 81 MG tablet Take 81 mg by mouth daily.  Marland Kitchen atorvastatin (LIPITOR) 10 MG tablet TAKE 1 TABLET BY MOUTH AT BEDTIME  . carisoprodol (SOMA) 350 MG tablet Take 350 mg by mouth 3 (three) times daily as needed for headache.  . cyclobenzaprine (FLEXERIL) 10 MG tablet Take 1 tablet (10 mg total) by mouth 3 (three) times daily as needed for muscle spasms.  Marland Kitchen glucose blood (ONETOUCH VERIO) test strip Use as instructed  . hydrochlorothiazide (HYDRODIURIL) 25 MG tablet Take 1 tablet (25 mg total) by mouth daily.  Marland Kitchen  losartan (COZAAR) 100 MG tablet Take 1 tablet (100 mg total) by mouth daily.  Marland Kitchen LYRICA 100 MG capsule Take 150 mg by mouth 3 (three) times daily.   . metFORMIN (GLUCOPHAGE) 1000 MG tablet TAKE 1 TABLET BY MOUTH TWICE A DAY WITH A MEAL  . metoprolol succinate (TOPROL-XL) 25 MG 24 hr tablet Take 1 tablet (25 mg total) by mouth daily.  . Venlafaxine HCl 150 MG TB24 TAKE 1 TABLET BY MOUTH EVERY DAY  . zolpidem (AMBIEN) 10 MG tablet Take 1 tablet (10 mg total) by mouth at bedtime as needed for sleep.    Patient Care Team: Jerrol Banana., MD as PCP - General (Family Medicine)     Objective:   Vitals: BP 126/80 (BP Location: Left Arm, Patient Position: Sitting, Cuff Size: Normal)   Pulse 76   Temp 97.8 F (36.6 C) (Oral)   Resp 14   Ht 5\' 10"  (1.778 m)   Wt 223 lb (101.2 kg)   BMI 32.00 kg/m    Physical Exam  Constitutional: He is oriented to person, place, and time. He appears well-developed and well-nourished.  HENT:  Head: Normocephalic and  atraumatic.  Right Ear: External ear normal.  Left Ear: External ear normal.  Nose: Nose normal.  Mouth/Throat: Oropharynx is clear and moist.  Eyes: Conjunctivae and EOM are normal. Pupils are equal, round, and reactive to light.  Neck: Normal range of motion. Neck supple.  Cardiovascular: Normal rate, regular rhythm, normal heart sounds and intact distal pulses.   Pulmonary/Chest: Effort normal and breath sounds normal.  Abdominal: Soft. Bowel sounds are normal.  Genitourinary: Rectum normal, prostate normal and penis normal.  Musculoskeletal: Normal range of motion.  Neurological: He is alert and oriented to person, place, and time. He has normal reflexes.  Skin: Skin is warm and dry.  Very fair skin  Psychiatric: He has a normal mood and affect. His behavior is normal. Judgment and thought content normal.     Depression Screen PHQ 2/9 Scores 02/18/2016 01/15/2015  PHQ - 2 Score 4 2  PHQ- 9 Score 15 14      Assessment & Plan:     Routine Health Maintenance and Physical Exam  Exercise Activities and Dietary recommendations Goals    None      Immunization History  Administered Date(s) Administered  . Influenza,inj,Quad PF,36+ Mos 05/14/2015  . Pneumococcal Polysaccharide-23 01/21/2012  . Tdap 01/21/2012    Health Maintenance  Topic Date Due  . Hepatitis C Screening  1961-04-03  . FOOT EXAM  01/16/1969  . OPHTHALMOLOGY EXAM  01/16/1969  . HIV Screening  01/16/1974  . COLONOSCOPY  01/16/2009  . INFLUENZA VACCINE  03/03/2016  . HEMOGLOBIN A1C  08/21/2016  . PNEUMOCOCCAL POLYSACCHARIDE VACCINE (2) 01/20/2017  . TETANUS/TDAP  01/20/2022      Discussed health benefits of physical activity, and encouraged him to engage in regular exercise appropriate for his age and condition.   I have done the exam and reviewed the above chart and it is accurate to the best of my knowledge.    --------------------------------------------------------------------    [name], MD  [facility]

## 2016-03-12 ENCOUNTER — Encounter: Payer: Self-pay | Admitting: *Deleted

## 2016-03-12 ENCOUNTER — Ambulatory Visit
Admission: RE | Admit: 2016-03-12 | Discharge: 2016-03-12 | Disposition: A | Discharge status: Home / Self Care | Payer: 59 | Source: Ambulatory Visit | Attending: Ophthalmology | Admitting: Ophthalmology

## 2016-03-12 ENCOUNTER — Encounter
Admission: RE | Disposition: A | Discharge status: Home / Self Care | Payer: Self-pay | Source: Ambulatory Visit | Attending: Ophthalmology

## 2016-03-12 ENCOUNTER — Ambulatory Visit: Payer: 59 | Admitting: Anesthesiology

## 2016-03-12 DIAGNOSIS — E119 Type 2 diabetes mellitus without complications: Secondary | ICD-10-CM | POA: Diagnosis not present

## 2016-03-12 DIAGNOSIS — G4730 Sleep apnea, unspecified: Secondary | ICD-10-CM | POA: Insufficient documentation

## 2016-03-12 DIAGNOSIS — F329 Major depressive disorder, single episode, unspecified: Secondary | ICD-10-CM | POA: Insufficient documentation

## 2016-03-12 DIAGNOSIS — I10 Essential (primary) hypertension: Secondary | ICD-10-CM | POA: Insufficient documentation

## 2016-03-12 DIAGNOSIS — H2512 Age-related nuclear cataract, left eye: Secondary | ICD-10-CM | POA: Insufficient documentation

## 2016-03-12 DIAGNOSIS — Z87891 Personal history of nicotine dependence: Secondary | ICD-10-CM | POA: Diagnosis not present

## 2016-03-12 DIAGNOSIS — I739 Peripheral vascular disease, unspecified: Secondary | ICD-10-CM | POA: Insufficient documentation

## 2016-03-12 DIAGNOSIS — M1990 Unspecified osteoarthritis, unspecified site: Secondary | ICD-10-CM | POA: Insufficient documentation

## 2016-03-12 DIAGNOSIS — G40909 Epilepsy, unspecified, not intractable, without status epilepticus: Secondary | ICD-10-CM | POA: Diagnosis not present

## 2016-03-12 DIAGNOSIS — G709 Myoneural disorder, unspecified: Secondary | ICD-10-CM | POA: Diagnosis not present

## 2016-03-12 HISTORY — DX: Malignant (primary) neoplasm, unspecified: C80.1

## 2016-03-12 HISTORY — DX: Sleep apnea, unspecified: G47.30

## 2016-03-12 HISTORY — DX: Tinnitus, unspecified ear: H93.19

## 2016-03-12 HISTORY — DX: Major depressive disorder, single episode, unspecified: F32.9

## 2016-03-12 HISTORY — DX: Depression, unspecified: F32.A

## 2016-03-12 HISTORY — PX: CATARACT EXTRACTION W/PHACO: SHX586

## 2016-03-12 LAB — GLUCOSE, CAPILLARY: Glucose-Capillary: 183 mg/dL — ABNORMAL HIGH (ref 65–99)

## 2016-03-12 SURGERY — PHACOEMULSIFICATION, CATARACT, WITH IOL INSERTION
Anesthesia: Monitor Anesthesia Care | Site: Eye | Laterality: Left | Wound class: Clean

## 2016-03-12 MED ORDER — ARMC OPHTHALMIC DILATING GEL
1.0000 "application " | OPHTHALMIC | Status: AC | PRN
  Administered 2016-03-12 (×2): 1 via OPHTHALMIC

## 2016-03-12 MED ORDER — EPINEPHRINE HCL 1 MG/ML IJ SOLN
INTRAOCULAR | Status: DC | PRN
  Administered 2016-03-12: 1 mL via OPHTHALMIC

## 2016-03-12 MED ORDER — FENTANYL CITRATE (PF) 100 MCG/2ML IJ SOLN
INTRAMUSCULAR | Status: DC | PRN
  Administered 2016-03-12: 50 ug via INTRAVENOUS

## 2016-03-12 MED ORDER — CEFUROXIME OPHTHALMIC INJECTION 1 MG/0.1 ML
INJECTION | OPHTHALMIC | Status: AC
  Filled 2016-03-12: qty 0.1

## 2016-03-12 MED ORDER — MOXIFLOXACIN HCL 0.5 % OP SOLN: 1.0000 [drp] | OPHTHALMIC | Status: DC | PRN

## 2016-03-12 MED ORDER — LIDOCAINE HCL (PF) 4 % IJ SOLN
INTRAOCULAR | Status: DC | PRN
  Administered 2016-03-12: .5 mL via OPHTHALMIC

## 2016-03-12 MED ORDER — NA CHONDROIT SULF-NA HYALURON 40-17 MG/ML IO SOLN
INTRAOCULAR | Status: AC
  Filled 2016-03-12: qty 1

## 2016-03-12 MED ORDER — POVIDONE-IODINE 5 % OP SOLN
OPHTHALMIC | Status: AC
  Administered 2016-03-12: 1 via OPHTHALMIC
  Filled 2016-03-12: qty 30

## 2016-03-12 MED ORDER — POVIDONE-IODINE 5 % OP SOLN
1.0000 "application " | Freq: Once | OPHTHALMIC | Status: AC
  Administered 2016-03-12: 1 via OPHTHALMIC

## 2016-03-12 MED ORDER — LIDOCAINE HCL (PF) 4 % IJ SOLN
INTRAMUSCULAR | Status: AC
  Filled 2016-03-12: qty 5

## 2016-03-12 MED ORDER — MIDAZOLAM HCL 2 MG/2ML IJ SOLN
INTRAMUSCULAR | Status: DC | PRN
  Administered 2016-03-12: 1 mg via INTRAVENOUS

## 2016-03-12 MED ORDER — TETRACAINE HCL 0.5 % OP SOLN
OPHTHALMIC | Status: AC
  Administered 2016-03-12: 1 [drp] via OPHTHALMIC
  Filled 2016-03-12: qty 2

## 2016-03-12 MED ORDER — TETRACAINE HCL 0.5 % OP SOLN
1.0000 [drp] | Freq: Once | OPHTHALMIC | Status: AC
  Administered 2016-03-12: 1 [drp] via OPHTHALMIC

## 2016-03-12 MED ORDER — EPINEPHRINE HCL 1 MG/ML IJ SOLN
INTRAMUSCULAR | Status: AC
  Filled 2016-03-12: qty 2

## 2016-03-12 MED ORDER — MOXIFLOXACIN HCL 0.5 % OP SOLN
OPHTHALMIC | Status: AC
  Filled 2016-03-12: qty 3

## 2016-03-12 MED ORDER — SODIUM CHLORIDE 0.9 % IV SOLN
INTRAVENOUS | Status: DC
  Administered 2016-03-12: 50 mL/h via INTRAVENOUS

## 2016-03-12 MED ORDER — CARBACHOL 0.01 % IO SOLN
INTRAOCULAR | Status: DC | PRN
  Administered 2016-03-12: .5 mL via INTRAOCULAR

## 2016-03-12 MED ORDER — MOXIFLOXACIN HCL 0.5 % OP SOLN
OPHTHALMIC | Status: DC | PRN
  Administered 2016-03-12: 1 [drp] via OPHTHALMIC

## 2016-03-12 MED ORDER — NA CHONDROIT SULF-NA HYALURON 40-17 MG/ML IO SOLN
INTRAOCULAR | Status: DC | PRN
  Administered 2016-03-12: 1 mL via INTRAOCULAR

## 2016-03-12 MED ORDER — ARMC OPHTHALMIC DILATING GEL
OPHTHALMIC | Status: AC
  Administered 2016-03-12: 1 via OPHTHALMIC
  Filled 2016-03-12: qty 0.25

## 2016-03-12 MED ORDER — CEFUROXIME OPHTHALMIC INJECTION 1 MG/0.1 ML
INJECTION | OPHTHALMIC | Status: DC | PRN
  Administered 2016-03-12: .1 mL via INTRACAMERAL

## 2016-03-12 SURGICAL SUPPLY — 22 items
CANNULA ANT/CHMB 27G (MISCELLANEOUS) ×1 IMPLANT
CANNULA ANT/CHMB 27GA (MISCELLANEOUS) ×2 IMPLANT
CUP MEDICINE 2OZ PLAST GRAD ST (MISCELLANEOUS) ×2 IMPLANT
GLOVE BIO SURGEON STRL SZ8 (GLOVE) ×2 IMPLANT
GLOVE BIOGEL M 6.5 STRL (GLOVE) ×2 IMPLANT
GLOVE SURG LX 8.0 MICRO (GLOVE) ×1
GLOVE SURG LX STRL 8.0 MICRO (GLOVE) ×1 IMPLANT
GOWN STRL REUS W/ TWL LRG LVL3 (GOWN DISPOSABLE) ×2 IMPLANT
GOWN STRL REUS W/TWL LRG LVL3 (GOWN DISPOSABLE) ×4
LENS IOL TECNIS ITEC 20.5 (Intraocular Lens) ×1 IMPLANT
PACK CATARACT (MISCELLANEOUS) ×2 IMPLANT
PACK CATARACT BRASINGTON LX (MISCELLANEOUS) ×2 IMPLANT
PACK EYE AFTER SURG (MISCELLANEOUS) ×2 IMPLANT
SOL BSS BAG (MISCELLANEOUS) ×2
SOL PREP PVP 2OZ (MISCELLANEOUS) ×2
SOLUTION BSS BAG (MISCELLANEOUS) ×1 IMPLANT
SOLUTION PREP PVP 2OZ (MISCELLANEOUS) ×1 IMPLANT
SYR 3ML LL SCALE MARK (SYRINGE) ×2 IMPLANT
SYR 5ML LL (SYRINGE) ×2 IMPLANT
SYR TB 1ML 27GX1/2 LL (SYRINGE) ×2 IMPLANT
WATER STERILE IRR 1000ML POUR (IV SOLUTION) ×2 IMPLANT
WIPE NON LINTING 3.25X3.25 (MISCELLANEOUS) ×2 IMPLANT

## 2016-03-12 NOTE — Anesthesia Postprocedure Evaluation (Signed)
Anesthesia Post Note  Patient: Jeff [name]  Procedure(s) Performed: Procedure(s) (LRB): CATARACT EXTRACTION PHACO AND INTRAOCULAR LENS PLACEMENT (IOC) (Left)  Patient location during evaluation: Other Anesthesia Type: MAC Level of consciousness: awake and alert Pain management: pain level controlled Vital Signs Assessment: post-procedure vital signs reviewed and stable Respiratory status: spontaneous breathing, nonlabored ventilation, respiratory function stable and patient connected to nasal cannula oxygen Cardiovascular status: stable and blood pressure returned to baseline Anesthetic complications: no    Last Vitals:  Vitals:   03/12/16 0755 03/12/16 0806  BP: 124/81 111/77  Pulse: 70 73  Resp: 16 16  Temp:      Last Pain:  Vitals:   03/12/16 0806  TempSrc:   PainSc: 0-No pain                 Desiray Orchard S

## 2016-03-12 NOTE — Anesthesia Procedure Notes (Signed)
Performed by: COOK-MARTIN, Caitlin Hillmer Pre-anesthesia Checklist: Patient identified, Suction available, Emergency Drugs available, Patient being monitored and Timeout performed Patient Re-evaluated:Patient Re-evaluated prior to inductionOxygen Delivery Method: Nasal cannula Preoxygenation: Pre-oxygenation with 100% oxygen Intubation Type: IV induction Placement Confirmation: positive ETCO2 and CO2 detector       

## 2016-03-12 NOTE — Anesthesia Preprocedure Evaluation (Signed)
Anesthesia Evaluation  Patient identified by MRN, date of birth, ID band Patient awake    Reviewed: Allergy & Precautions, NPO status , Patient's Chart, lab work & pertinent test results, reviewed documented beta blocker date and time   Airway Mallampati: III  TM Distance: >3 FB     Dental  (+) Chipped   Pulmonary sleep apnea and Continuous Positive Airway Pressure Ventilation , former smoker,           Cardiovascular hypertension, Pt. on medications and Pt. on home beta blockers + Peripheral Vascular Disease       Neuro/Psych  Headaches, Seizures -,  PSYCHIATRIC DISORDERS Depression  Neuromuscular disease    GI/Hepatic   Endo/Other  diabetes, Type 2  Renal/GU      Musculoskeletal  (+) Arthritis ,   Abdominal   Peds  Hematology   Anesthesia Other Findings   Reproductive/Obstetrics                             Anesthesia Physical Anesthesia Plan  ASA: III  Anesthesia Plan: MAC   Post-op Pain Management:    Induction:   Airway Management Planned:   Additional Equipment:   Intra-op Plan:   Post-operative Plan:   Informed Consent: I have reviewed the patients History and Physical, chart, labs and discussed the procedure including the risks, benefits and alternatives for the proposed anesthesia with the patient or authorized representative who has indicated his/her understanding and acceptance.     Plan Discussed with: CRNA  Anesthesia Plan Comments:         Anesthesia Quick Evaluation

## 2016-03-12 NOTE — Discharge Instructions (Signed)
Eye Surgery Discharge Instructions  Expect mild scratchy sensation or mild soreness. DO NOT RUB YOUR EYE!  The day of surgery:  Minimal physical activity, but bed rest is not required  No reading, computer work, or close hand work  No bending, lifting, or straining.  May watch TV  For 24 hours:  No driving, legal decisions, or alcoholic beverages  Safety precautions  Eat anything you prefer: It is better to start with liquids, then soup then solid foods.  _____ Eye patch should be worn until postoperative exam tomorrow.  ____ Solar shield eyeglasses should be worn for comfort in the sunlight/patch while sleeping  Resume all regular medications including aspirin or Coumadin if these were discontinued prior to surgery. You may shower, bathe, shave, or wash your hair. Tylenol may be taken for mild discomfort.  Call your doctor if you experience significant pain, nausea, or vomiting, fever > 101 or other signs of infection. [phone] or [phone] Specific instructions:  Follow-up Information    Tim Lair, MD .   Specialty:  Ophthalmology Why:  tomorrow 03/13/16 at 10:50 am Contact information: [place] [address] [phone]

## 2016-03-12 NOTE — H&P (Signed)
  All labs reviewed. Abnormal studies sent to patients PCP when indicated.  Previous H&P reviewed, patient examined, there are NO CHANGES.  Jeff [name] LOUIS8/10/20177:17 AM

## 2016-03-12 NOTE — Op Note (Signed)
PREOPERATIVE DIAGNOSIS:  Nuclear sclerotic cataract of the left eye.   POSTOPERATIVE DIAGNOSIS:  nuclear sclerotic cataract left eye   OPERATIVE PROCEDURE:  Procedure(s): CATARACT EXTRACTION PHACO AND INTRAOCULAR LENS PLACEMENT (IOC)   SURGEON:  Birder Robson, MD.   ANESTHESIA:   Anesthesiologist: Gunnar Bulla, MD CRNA: Vaughan Sine  1.      Managed anesthesia care. 2.      Topical tetracaine drops followed by 2% Xylocaine jelly applied in the preoperative holding area.       3.   0.2 ml of epi-Shugarcaine was  placed in the anterior chamber following the paracentesis.    COMPLICATIONS:  None.   TECHNIQUE:   Stop and chop   DESCRIPTION OF PROCEDURE:  The patient was examined and consented in the preoperative holding area where the aforementioned topical anesthesia was applied to the left eye and then brought back to the Operating Room where the left eye was prepped and draped in the usual sterile ophthalmic fashion and a lid speculum was placed. A paracentesis was created with the side port blade and the anterior chamber was filled with viscoelastic. A near clear corneal incision was performed with the steel keratome. A continuous curvilinear capsulorrhexis was performed with a cystotome followed by the capsulorrhexis forceps. Hydrodissection and hydrodelineation were carried out with BSS on a blunt cannula. The lens was removed in a stop and chop  technique and the remaining cortical material was removed with the irrigation-aspiration handpiece. The capsular bag was inflated with viscoelastic and the Technis [id number] lens was placed in the capsular bag without complication. The remaining viscoelastic was removed from the eye with the irrigation-aspiration handpiece. The wounds were hydrated. The anterior chamber was flushed with Miostat and the eye was inflated to physiologic pressure. 0.1 mL of cefuroxime concentration 10 mg/mL was placed in the anterior chamber. The wounds were found  to be water tight. The eye was dressed with Vigamox. The patient was given protective glasses to wear throughout the day and a shield with which to sleep tonight. The patient was also given drops with which to begin a drop regimen today and will follow-up with me in one day.  Implant Name Type Inv. Item Serial No. Manufacturer Lot No. LRB No. Used  LENS IOL DIOP 20.5 - [id number] Intraocular Lens LENS IOL DIOP 20.5 [id number]   Left 1   Procedure(s) with comments: CATARACT EXTRACTION PHACO AND INTRAOCULAR LENS PLACEMENT (IOC) (Left) - [place] 00:31 AP% 17.9 CDE 5.67 Fluid pack lot # [id number]  Electronically signed: Lord Lancour LOUIS 03/12/2016 7:45 AM

## 2016-03-12 NOTE — Transfer of Care (Signed)
Immediate Anesthesia Transfer of Care Note  Patient: Jeff [name]  Procedure(s) Performed: Procedure(s) with comments: CATARACT EXTRACTION PHACO AND INTRAOCULAR LENS PLACEMENT (IOC) (Left) - [place] 00:31 AP% 17.9 CDE 5.67 Fluid pack lot # [id number]  Patient Location: PACU and Short Stay  Anesthesia Type:MAC  Level of Consciousness: awake, alert  and oriented  Airway & Oxygen Therapy: Patient Spontanous Breathing  Post-op Assessment: Report given to RN and Post -op Vital signs reviewed and stable  Post vital signs: Reviewed and stable  Last Vitals:  Vitals:   03/12/16 0618 03/12/16 0747  BP: 127/89 (!) 117/57  Pulse: 77 76  Resp: 16 16  Temp: 36.6 C (!) 35.7 C    Last Pain:  Vitals:   03/12/16 0747  TempSrc: Tympanic  PainSc: 0-No pain      Patients Stated Pain Goal: 4 ([id number])  Complications: No apparent anesthesia complications

## 2016-03-24 ENCOUNTER — Telehealth: Payer: Self-pay | Admitting: Family Medicine

## 2016-03-24 ENCOUNTER — Encounter: Payer: Self-pay | Admitting: Pain Medicine

## 2016-03-24 ENCOUNTER — Ambulatory Visit: Payer: 59 | Attending: Pain Medicine | Admitting: Pain Medicine

## 2016-03-24 VITALS — BP 137/86 | HR 71 | Temp 98.1°F | Resp 18 | Ht 70.0 in | Wt 220.0 lb

## 2016-03-24 DIAGNOSIS — M79601 Pain in right arm: Secondary | ICD-10-CM | POA: Diagnosis present

## 2016-03-24 DIAGNOSIS — I70209 Unspecified atherosclerosis of native arteries of extremities, unspecified extremity: Secondary | ICD-10-CM

## 2016-03-24 DIAGNOSIS — M5030 Other cervical disc degeneration, unspecified cervical region: Secondary | ICD-10-CM | POA: Diagnosis not present

## 2016-03-24 DIAGNOSIS — M542 Cervicalgia: Secondary | ICD-10-CM | POA: Diagnosis present

## 2016-03-24 DIAGNOSIS — I739 Peripheral vascular disease, unspecified: Secondary | ICD-10-CM | POA: Diagnosis not present

## 2016-03-24 DIAGNOSIS — M5134 Other intervertebral disc degeneration, thoracic region: Secondary | ICD-10-CM | POA: Diagnosis not present

## 2016-03-24 DIAGNOSIS — M5481 Occipital neuralgia: Secondary | ICD-10-CM | POA: Diagnosis not present

## 2016-03-24 DIAGNOSIS — Z9889 Other specified postprocedural states: Secondary | ICD-10-CM | POA: Insufficient documentation

## 2016-03-24 DIAGNOSIS — M79602 Pain in left arm: Secondary | ICD-10-CM | POA: Diagnosis present

## 2016-03-24 DIAGNOSIS — E1140 Type 2 diabetes mellitus with diabetic neuropathy, unspecified: Secondary | ICD-10-CM | POA: Diagnosis not present

## 2016-03-24 DIAGNOSIS — M62830 Muscle spasm of back: Secondary | ICD-10-CM | POA: Diagnosis not present

## 2016-03-24 DIAGNOSIS — M533 Sacrococcygeal disorders, not elsewhere classified: Secondary | ICD-10-CM | POA: Insufficient documentation

## 2016-03-24 DIAGNOSIS — M5136 Other intervertebral disc degeneration, lumbar region: Secondary | ICD-10-CM | POA: Diagnosis not present

## 2016-03-24 DIAGNOSIS — M47812 Spondylosis without myelopathy or radiculopathy, cervical region: Secondary | ICD-10-CM

## 2016-03-24 NOTE — Progress Notes (Signed)
Safety precautions to be maintained throughout the outpatient stay will include: orient to surroundings, keep bed in low position, maintain call bell within reach at all times, provide assistance with transfer out of bed and ambulation.  

## 2016-03-24 NOTE — Telephone Encounter (Signed)
error 

## 2016-03-24 NOTE — Patient Instructions (Addendum)
PLAN  Continue present medications at this time  NO  TOPAMAX  May consider greater occipital nerve block as discussed pending approval by ophthalmologist  F/U PCP Dr. Miguel Aschoff for evaluation of  BP and general medical  condition  F/U surgical evaluation with Dr. Arnoldo Morale as discussed. Please ask the nurses and secretary the date of your neurosurgical reevaluation with Dr. Arnoldo Morale  F/U neurological evaluation. May consider pending follow-up evaluations. We may consider updated [facility] EMG studies. According to you prior [facility] EMG studies showed encroachment of nerve going to both lower extremities. We will have you undergo neurosurgical reevaluation with Dr. Arnoldo Morale at this time as discussed  Cataract surgery as planned  May consider radiofrequency rhizolysis or intraspinal procedures pending response to present treatment and F/U evaluation   Patient to call Pain Management Center should patient have concerns prior to scheduled return appointment.

## 2016-03-24 NOTE — Progress Notes (Signed)
The patient is a 55 year old gentleman who returns to pain management for further evaluation and treatment of pain involving the neck upper extremity regions entire back and lower extremity region. On today's visit we discussed patient's condition. The patient continues to be with significant pain involving the cervical upper extremity region and the lumbar lower extremity region. We discuss prior medications which patient has been prescribed palpation was treated at [facility]. Discuss other medications as well. At the present time we informed patient that we would need to repeat the urine drug screen and that we would have patient undergo follow-up evaluation in terms of neurosurgical reevaluation. We informed patient that we will consider interventional treatment including occipital nerve blocks for headache once patient was cleared by his ophthalmologist (patient status post recent cataract surgery) to undergo interventional treatment. The patient was understanding and in agreement with suggested treatment plan. The patient denied any recent trauma change in events of daily living the call significant change in symptomatology.    Physical examination  There was moderate tenderness to palpation of the splenius capitis and occipitalis region. Palpation over the cervical facet cervical paraspinal musculature region reproducing moderate to moderately severe discomfort. There appeared to be unremarkable Spurling's maneuver. The patient was with slightly decreased grip strength. Tinel and Phalen's maneuver were without increased pain of significant degree. The patient was with mild to moderate difficulty performing drop test. Palpation over the thoracic region was with tenderness to palpation without crepitus of the thoracic region. There was moderate muscle spasms involving the trapezius levator scapula and rhomboid musculature regions. Palpation over the lumbar paraspinal musculatures  and lumbar facet region was with moderate discomfort as well lateral bending rotation extension and palpation over the lumbar facets reproduce moderate discomfort. There was tenderness over the PSIS and PII S region a moderate degree. There was minimal tenderness of the greater trochanteric region and iliotibial band region. Straight leg raising was tolerates approximately 20 without increased pain with dorsiflexion noted. DTRs appeared to be trace at the knees. There was negative clonus negative Homans and no definite sensory deficit or dermatomal distribution detected. Abdomen was nontender with no costovertebral angle tenderness noted.     Assessment   Degenerative disc disease cervical spine  Status post surgery of the cervical region  Cervical facet syndrome  Degenerative disc disease lumbar spine  Status post surgery of the lumbar region  Lumbar facet syndrome  Status post spinal cord stimulator placement for lumbar lower extremity pain  Diabetes mellitus with diabetic neuropathy  Peripheral vascular disease  Sacroiliac joint dysfunction  Bilateral occipital neuralgia      PLAN  Continue present medications at this time  NO  TOPAMAX  May consider greater occipital nerve block as discussed pending approval by ophthalmologist  F/U PCP Dr. Miguel Aschoff for evaluation of  BP and general medical  condition  F/U surgical evaluation with Dr. Arnoldo Morale as discussed. Please ask the nurses and secretary the date of your neurosurgical reevaluation with Dr. Arnoldo Morale  F/U neurological evaluation. May consider pending follow-up evaluations. We may consider updated PNCV EMG studies. According to you prior PNCV EMG studies showed encroachment of nerve going to both lower extremities. We will have you undergo neurosurgical reevaluation with Dr. Arnoldo Morale at this time as discusse  May consider radiofrequency rhizolysis or intraspinal procedures pending response to present treatment  and F/U evaluation   Patient to call Pain Management Center should patient have concerns prior to scheduled return appointment.

## 2016-03-26 ENCOUNTER — Encounter: Payer: Self-pay | Admitting: Family Medicine

## 2016-03-26 ENCOUNTER — Ambulatory Visit (INDEPENDENT_AMBULATORY_CARE_PROVIDER_SITE_OTHER): Payer: 59 | Admitting: Family Medicine

## 2016-03-26 VITALS — BP 120/78 | HR 72 | Temp 98.5°F | Resp 16 | Wt 229.0 lb

## 2016-03-26 DIAGNOSIS — M5412 Radiculopathy, cervical region: Secondary | ICD-10-CM | POA: Diagnosis not present

## 2016-03-26 DIAGNOSIS — E0842 Diabetes mellitus due to underlying condition with diabetic polyneuropathy: Secondary | ICD-10-CM | POA: Diagnosis not present

## 2016-03-26 MED ORDER — OXYCODONE HCL 10 MG PO TABS: 10.0000 mg | ORAL_TABLET | ORAL | 0 refills | Status: DC | PRN

## 2016-03-26 NOTE — Progress Notes (Signed)
Patient: Jeff [name] Male    DOB: [birth year]   55 y.o.   MRN: [id number] Visit Date: 03/26/2016  Today's Provider: [name], MD   Chief Complaint  Patient presents with  . Neck Pain   Subjective:    HPI Patient comes in today c/o neck pain. Patient reports that he has had this issue for several months. However, it seems to be getting worse. Patient reports that he was going to the pain clinic in [facility] for this initally, but then he was referred to Dr. Primus [name]. On his first visit with Dr. Primus [name], he reports that he was instructed to leave a urine sample, but he reports that the nurse lost the urine specimen. Also, in the meantime he was supposed to be referred to a surgeon as well. Patient reports that he had to go back to the office (which was 2 days ago) and leave another urine sample, and get an update on his surgical referral. Patient reports that the referral is in process, but he reports that Dr. Primus [name] can not give him a refill on his pain medication until his next F/U appt which is in 1 month. Patient is requesting a refill to last until he sees Dr. Primus [name].     Allergies  Allergen Reactions  . Cymbalta [Duloxetine Hcl] Other (See Comments)    "jittery" and "loopy" per pt  . Gabapentin Other (See Comments)    caused severe tremor "jittery" and "loopy" per pt.  . Topamax  [Topiramate] Other (See Comments)    unresponsive  episodes  . Keppra [Levetiracetam] Other (See Comments) and Palpitations    "jittery", and "loopy." per pt.   Current Meds  Medication Sig  . aspirin 81 MG tablet Take 81 mg by mouth daily.  Marland Kitchen atorvastatin (LIPITOR) 10 MG tablet TAKE 1 TABLET BY MOUTH AT BEDTIME  . carisoprodol (SOMA) 350 MG tablet Take 350 mg by mouth 3 (three) times daily as needed for headache.  . cyclobenzaprine (FLEXERIL) 10 MG tablet Take 1 tablet (10 mg total) by mouth 3 (three) times daily as needed for muscle spasms.  Marland Kitchen glucose blood (ONETOUCH VERIO) test  strip Use as instructed  . hydrochlorothiazide (HYDRODIURIL) 25 MG tablet Take 1 tablet (25 mg total) by mouth daily.  Marland Kitchen losartan (COZAAR) 100 MG tablet Take 1 tablet (100 mg total) by mouth daily.  Marland Kitchen LYRICA 100 MG capsule Take 150 mg by mouth 3 (three) times daily.   . metFORMIN (GLUCOPHAGE) 1000 MG tablet TAKE 1 TABLET BY MOUTH TWICE A DAY WITH A MEAL  . metoprolol succinate (TOPROL-XL) 25 MG 24 hr tablet Take 1 tablet (25 mg total) by mouth daily.  . pregabalin (LYRICA) 150 MG capsule Take 1 capsule (150 mg total) by mouth 3 (three) times daily.  . Venlafaxine HCl 150 MG TB24 TAKE 1 TABLET BY MOUTH EVERY DAY    Review of Systems  Constitutional: Negative.   Eyes: Negative.   Respiratory: Negative.   Cardiovascular: Negative.   Gastrointestinal: Negative.   Endocrine: Negative.   Musculoskeletal: Positive for arthralgias, myalgias, neck pain and neck stiffness. Negative for back pain, gait problem and joint swelling.  Allergic/Immunologic: Negative.   Neurological: Positive for headaches. Negative for dizziness, tremors, seizures, syncope, facial asymmetry, speech difficulty, weakness, light-headedness and numbness.  Psychiatric/Behavioral: Negative.     Social History  Substance Use Topics  . Smoking status: Former Smoker    Packs/day: 1.50    Years: 30.00  Types: Cigarettes    Quit date: 07/08/1995  . Smokeless tobacco: Former Systems developer    Quit date: 12/16/1995  . Alcohol use No     Comment: Beer   Objective:   BP 120/78 (BP Location: Right Arm, Patient Position: Sitting, Cuff Size: Normal)   Pulse 72   Temp 98.5 F (36.9 C)   Resp 16   Wt 229 lb (103.9 kg)   BMI 32.86 kg/m   Physical Exam  Constitutional: He is oriented to person, place, and time. He appears well-developed and well-nourished.  HENT:  Head: Atraumatic.  Right Ear: External ear normal.  Left Ear: External ear normal.  Nose: Nose normal.  Eyes: Conjunctivae are normal. No scleral icterus.  Neck: Neck  supple. No thyromegaly present.  Cardiovascular: Normal rate, regular rhythm and normal heart sounds.   Pulmonary/Chest: Effort normal and breath sounds normal.  Abdominal: Soft.  Lymphadenopathy:    He has no cervical adenopathy.  Neurological: He is alert and oriented to person, place, and time.  Skin: Skin is warm and dry.  Very fair skin  Psychiatric: He has a normal mood and affect. His behavior is normal. Judgment and thought content normal.        Assessment & Plan:     Chronic cervical and lumbar radiculopathy He sees Dr. Primus [name] now from the pain clinic. It appears that Dr. Gerald [name] wants [place] to write narcotics. She got a prescription is written. Type 2 diabetes A1c on next visit. Hypertension Hyperlipidemia Mild obesity Adjustment reaction with the recent sudden cardiac death of his wife      I have done the exam and reviewed the above chart and it is accurate to the best of my knowledge.  Jeff [name] Mon, MD  [facility]

## 2016-04-02 LAB — TOXASSURE SELECT 13 (MW), URINE: PDF: 0

## 2016-04-02 NOTE — Progress Notes (Signed)
Reviewed

## 2016-04-07 ENCOUNTER — Telehealth: Payer: Self-pay | Admitting: *Deleted

## 2016-04-20 ENCOUNTER — Other Ambulatory Visit: Payer: Self-pay | Admitting: Neurosurgery

## 2016-04-20 ENCOUNTER — Other Ambulatory Visit: Payer: Self-pay | Admitting: Pain Medicine

## 2016-04-20 DIAGNOSIS — M542 Cervicalgia: Secondary | ICD-10-CM

## 2016-04-21 ENCOUNTER — Ambulatory Visit: Payer: 59 | Admitting: Pain Medicine

## 2016-04-27 ENCOUNTER — Other Ambulatory Visit: Payer: Self-pay | Admitting: Family Medicine

## 2016-04-28 ENCOUNTER — Other Ambulatory Visit: Payer: Self-pay | Admitting: Neurosurgery

## 2016-04-28 DIAGNOSIS — M542 Cervicalgia: Secondary | ICD-10-CM

## 2016-05-08 ENCOUNTER — Ambulatory Visit
Admission: RE | Admit: 2016-05-08 | Discharge: 2016-05-08 | Disposition: A | Discharge status: Home / Self Care | Payer: 59 | Source: Ambulatory Visit | Attending: Neurosurgery | Admitting: Neurosurgery

## 2016-05-08 VITALS — BP 119/78 | HR 71

## 2016-05-08 DIAGNOSIS — Z9889 Other specified postprocedural states: Secondary | ICD-10-CM

## 2016-05-08 DIAGNOSIS — M5020 Other cervical disc displacement, unspecified cervical region: Secondary | ICD-10-CM

## 2016-05-08 DIAGNOSIS — M542 Cervicalgia: Secondary | ICD-10-CM

## 2016-05-08 DIAGNOSIS — M961 Postlaminectomy syndrome, not elsewhere classified: Secondary | ICD-10-CM

## 2016-05-08 DIAGNOSIS — M47812 Spondylosis without myelopathy or radiculopathy, cervical region: Secondary | ICD-10-CM

## 2016-05-08 DIAGNOSIS — M5412 Radiculopathy, cervical region: Secondary | ICD-10-CM

## 2016-05-08 DIAGNOSIS — M4804 Spinal stenosis, thoracic region: Secondary | ICD-10-CM

## 2016-05-08 DIAGNOSIS — M5030 Other cervical disc degeneration, unspecified cervical region: Secondary | ICD-10-CM

## 2016-05-08 MED ORDER — IOPAMIDOL (ISOVUE-M 300) INJECTION 61%
10.0000 mL | Freq: Once | INTRAMUSCULAR | Status: AC | PRN
  Administered 2016-05-08: 10 mL via INTRATHECAL

## 2016-05-08 MED ORDER — DIAZEPAM 5 MG PO TABS
10.0000 mg | ORAL_TABLET | Freq: Once | ORAL | Status: AC
  Administered 2016-05-08: 10 mg via ORAL

## 2016-05-08 MED ORDER — ONDANSETRON HCL 4 MG/2ML IJ SOLN
4.0000 mg | Freq: Once | INTRAMUSCULAR | Status: AC
  Administered 2016-05-08: 4 mg via INTRAMUSCULAR

## 2016-05-08 MED ORDER — MEPERIDINE HCL 100 MG/ML IJ SOLN
100.0000 mg | Freq: Once | INTRAMUSCULAR | Status: AC
  Administered 2016-05-08: 100 mg via INTRAMUSCULAR

## 2016-05-08 NOTE — Discharge Instructions (Signed)
Myelogram Discharge Instructions  1. Go home and rest quietly for the next 24 hours.  It is important to lie flat for the next 24 hours.  Get up only to go to the restroom.  You may lie in the bed or on a couch on your back, your stomach, your left side or your right side.  You may have one pillow under your head.  You may have pillows between your knees while you are on your side or under your knees while you are on your back.  2. DO NOT drive today.  Recline the seat as far back as it will go, while still wearing your seat belt, on the way home.  3. You may get up to go to the bathroom as needed.  You may sit up for 10 minutes to eat.  You may resume your normal diet and medications unless otherwise indicated.  Drink lots of extra fluids today and tomorrow.  4. The incidence of headache, nausea, or vomiting is about 5% (one in 20 patients).  If you develop a headache, lie flat and drink plenty of fluids until the headache goes away.  Caffeinated beverages may be helpful.  If you develop severe nausea and vomiting or a headache that does not go away with flat bed rest, call [phone].  5. You may resume normal activities after your 24 hours of bed rest is over; however, do not exert yourself strongly or do any heavy lifting tomorrow. If when you get up you have a headache when standing, go back to bed and force fluids for another 24 hours.  6. Call your physician for a follow-up appointment.  The results of your myelogram will be sent directly to your physician by the following day.  7. If you have any questions or if complications develop after you arrive home, please call [phone].  Discharge instructions have been explained to the patient.  The patient, or the person responsible for the patient, fully understands these instructions.        May resume Venlafaxine on Oct. 7, 2017, after 10:30 am.

## 2016-05-08 NOTE — Progress Notes (Signed)
Pt states he has been off venlafaxine for the past 2 days.

## 2016-05-12 ENCOUNTER — Telehealth: Payer: Self-pay

## 2016-05-12 NOTE — Telephone Encounter (Signed)
Patient called back after I left message for him earlier asking how he is doing after his myelogram here 05/08/16.  He denies any headache and states he is doing fine.  jkl

## 2016-05-12 NOTE — Telephone Encounter (Signed)
Left message on pt's home/cell number asking how he is doing after myelo 05/08/16.  jkl

## 2016-05-21 DIAGNOSIS — Z85828 Personal history of other malignant neoplasm of skin: Secondary | ICD-10-CM

## 2016-05-21 HISTORY — DX: Personal history of other malignant neoplasm of skin: Z85.828

## 2016-06-17 ENCOUNTER — Encounter: Payer: Self-pay | Admitting: Family Medicine

## 2016-06-17 ENCOUNTER — Ambulatory Visit (INDEPENDENT_AMBULATORY_CARE_PROVIDER_SITE_OTHER): Payer: 59 | Admitting: Family Medicine

## 2016-06-17 VITALS — BP 142/84 | HR 78 | Temp 97.6°F | Resp 16 | Wt 230.0 lb

## 2016-06-17 DIAGNOSIS — E785 Hyperlipidemia, unspecified: Secondary | ICD-10-CM | POA: Diagnosis not present

## 2016-06-17 DIAGNOSIS — I10 Essential (primary) hypertension: Secondary | ICD-10-CM | POA: Diagnosis not present

## 2016-06-17 DIAGNOSIS — E119 Type 2 diabetes mellitus without complications: Secondary | ICD-10-CM | POA: Diagnosis not present

## 2016-06-17 LAB — POCT GLYCOSYLATED HEMOGLOBIN (HGB A1C): Hemoglobin A1C: 6.7

## 2016-06-17 NOTE — Progress Notes (Signed)
Subjective:  HPI    Diabetes Mellitus Type II, Follow-up:   Lab Results  Component Value Date   HGBA1C CANCELED 02/19/2016   HGBA1C 6.0 (H) 02/19/2016   HGBA1C 5.9 11/07/2015    Last seen for diabetes 4 months ago.  Management since then includes none. He reports good compliance with treatment. He is not having side effects.  Home blood sugar records: does not check daily but when he does its been running good.  Episodes of hypoglycemia? no   Current Insulin Regimen: n/a Most Recent Eye Exam: within the year Current exercise: none  Pertinent Labs:    Component Value Date/Time   CHOL CANCELED 02/19/2016 0813   TRIG CANCELED 02/19/2016 0813   HDL CANCELED 02/19/2016 0813   LDLCALC 83 02/19/2016 0000   CREATININE CANCELED 02/19/2016 0813    Wt Readings from Last 3 Encounters:  06/17/16 230 lb (104.3 kg)  03/26/16 229 lb (103.9 kg)  03/24/16 220 lb (99.8 kg)    ------------------------------------------------------------------------   Hypertension, follow-up:  BP Readings from Last 3 Encounters:  06/17/16 (!) 142/84  05/08/16 119/78  03/26/16 120/78    He was last seen for hypertension 4 months ago.  BP at that visit was 119/78. Management since that visit includes none. He reports good compliance with treatment. He is not having side effects.  He is not exercising. He is not adherent to low salt diet.   Outside blood pressures are running about like today's readings. He is experiencing none.  Patient denies chest pain, chest pressure/discomfort, claudication, dyspnea, exertional chest pressure/discomfort, fatigue, irregular heart beat, lower extremity edema, near-syncope, orthopnea, palpitations, paroxysmal nocturnal dyspnea, syncope and tachypnea.   Cardiovascular risk factors include advanced age (older than 8 for men, 95 for women), diabetes mellitus, hypertension, male gender and smoking/ tobacco exposure.  Wt Readings from Last 3 Encounters:    06/17/16 230 lb (104.3 kg)  03/26/16 229 lb (103.9 kg)  03/24/16 220 lb (99.8 kg)   ------------------------------------------------------------------------    Prior to Admission medications   Medication Sig Start Date End Date Taking? Authorizing Provider  aspirin 81 MG tablet Take 81 mg by mouth daily.   Yes Historical Provider, MD  atorvastatin (LIPITOR) 10 MG tablet TAKE 1 TABLET BY MOUTH AT BEDTIME 09/16/15  Yes Richard Maceo Pro., MD  carisoprodol (SOMA) 350 MG tablet Take 350 mg by mouth 3 (three) times daily as needed for headache. 12/26/15  Yes Historical Provider, MD  cyclobenzaprine (FLEXERIL) 10 MG tablet Take 1 tablet (10 mg total) by mouth 3 (three) times daily as needed for muscle spasms. 02/18/16  Yes Richard Maceo Pro., MD  glucose blood [facility] VERIO) test strip Use as instructed 08/14/15  Yes Richard Maceo Pro., MD  hydrochlorothiazide (HYDRODIURIL) 25 MG tablet TAKE 1 TABLET BY MOUTH EVERY DAY. *INSURANCE ONLY COVERS 30 DAYS** 04/27/16  Yes Jerrol Banana., MD  losartan (COZAAR) 100 MG tablet Take 1 tablet (100 mg total) by mouth daily. 02/05/16  Yes Richard Maceo Pro., MD  metFORMIN (GLUCOPHAGE) 1000 MG tablet TAKE 1 TABLET BY MOUTH TWICE A DAY WITH A MEAL 01/06/16  Yes Jerrol Banana., MD  metoprolol succinate (TOPROL-XL) 25 MG 24 hr tablet Take 1 tablet (25 mg total) by mouth daily. 01/08/16  Yes Richard Maceo Pro., MD  Oxycodone HCl 10 MG TABS Take 1 tablet (10 mg total) by mouth every 4 (four) hours as needed. 03/26/16  Yes Richard Maceo Pro., MD  pregabalin (LYRICA) 150 MG capsule Take 1 capsule (150 mg total) by mouth 3 (three) times daily. 03/11/16  Yes Richard Maceo Pro., MD  Venlafaxine HCl 150 MG TB24 TAKE 1 TABLET BY MOUTH EVERY DAY 04/27/16  Yes Jerrol Banana., MD  zolpidem (AMBIEN) 10 MG tablet Take 10 mg by mouth at bedtime as needed for sleep.   Yes Historical Provider, MD  FLUARIX QUADRIVALENT 0.5 ML injection TO BE ADMINISTERED  BY PHARMACIST FOR IMMUNIZATION 04/16/16   Historical Provider, MD    Patient Active Problem List   Diagnosis Date Noted  . DDD (degenerative disc disease), cervical 02/25/2016  . H/O cervical spine surgery 02/25/2016  . Cervical facet syndrome 02/25/2016  . DDD (degenerative disc disease), lumbar 02/25/2016  . Facet syndrome, lumbar 02/25/2016  . Sacroiliac joint dysfunction 02/25/2016  . Atherosclerotic peripheral vascular disease ([place]) 02/25/2016  . Abnormal kidney function 12/06/2014  . Cervical nerve root disorder 12/06/2014  . Colon polyp 12/06/2014  . Clinical depression 12/06/2014  . Essential (primary) hypertension 12/06/2014  . Cephalalgia 12/06/2014  . Bulge of cervical disc without myelopathy 12/06/2014  . HLD (hyperlipidemia) 12/06/2014  . 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 Displacement of lumbar intervertebral disc without myelopathy 12/06/2014  . L-S radiculopathy 12/06/2014  . Lyme disease 12/06/2014  . Mild major depression ([place]) 12/06/2014  . Neuropathy ([place]) 12/06/2014  . Adiposity 12/06/2014  . Peripheral vascular disease ([place]) 12/06/2014  . Diabetes mellitus, type 2 ([place]) 12/06/2014  . Cervical post-laminectomy syndrome 10/04/2013  . Cervical pain 10/04/2013  . Peripheral neuropathic pain 10/04/2013  . Failed back syndrome of thoracic spine 10/04/2013  . Fatigue 03/17/2013  . Hypokalemia 01/30/2013  . Positive D dimer 01/30/2013  . Chest pain 01/28/2013  . Seizures ([place]) 01/28/2013  . Syncope 01/28/2013  . Chronic pain associated with significant psychosocial dysfunction 08/17/2012  . Polypharmacy 08/02/2012  . 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 Post laminectomy syndrome 08/02/2012  . Thoracic spinal stenosis 06/22/2011    Past Medical History:  Diagnosis Date  . Cancer ([place])    SKIN  . Depression   . Diabetes mellitus without complication ([place])   . Fatigue   . Hyperlipidemia   . Hypertension   . Hypogonadism male   . Neuromuscular disorder ([place])     "back nerve stimulator"  . Neuropathy ([place])   . PAD (peripheral artery disease) ([place])   . Sleep apnea    CPAP  . Tinnitus   . Tuberculosis    POSITIVE  TB SKIN TEST 1992.6 MTH [place] .was exposed to someone who had it.    Social History   Social History  . Marital status: Widowed    Spouse name: N/A  . Number of children: N/A  . Years of education: N/A   Occupational History  . works at [employer]   . [place] History Main Topics  . Smoking status: Former Smoker    Packs/day: 1.50    Years: 30.00    Types: Cigarettes    Quit date: 07/08/1995  . Smokeless tobacco: Former Systems developer    Quit date: 12/16/1995  . Alcohol use No     Comment: Beer  . Drug use: No  . Sexual activity: Yes    Birth control/ protection: None   Other Topics Concern  . 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    Allergies  Allergen Reactions  . Cymbalta [Duloxetine  Hcl] Other (See Comments)    "jittery" and "loopy" per pt  . Neurontin [Gabapentin] Other (See Comments)    caused severe tremor "jittery" and "loopy" per pt.  . Topamax  [Topiramate] Other (See Comments)    unresponsive  episodes  . Keppra [Levetiracetam] Other (See Comments) and Palpitations    "jittery", and "loopy." per pt.    Review of Systems  Constitutional: Negative.   HENT: Negative.   Eyes: Negative.   Respiratory: Negative.   Cardiovascular: Negative.   Gastrointestinal: Negative.   Genitourinary: Negative.   Musculoskeletal: Positive for back pain.  Skin: Negative.   Neurological: Negative.   Endo/Heme/Allergies: Negative.   Psychiatric/Behavioral: Negative.     Immunization History  Administered Date(s) Administered  . Influenza,inj,Quad PF,36+ Mos 05/14/2015  . Influenza-Unspecified 04/16/2016  . Pneumococcal Polysaccharide-23 01/21/2012  . Tdap 01/21/2012    Objective:  BP (!) 142/84 (BP Location: Left Arm, Patient  Position: Sitting, Cuff Size: Normal)   Pulse 78   Temp 97.6 F (36.4 C) (Oral)   Resp 16   Wt 230 lb (104.3 kg)   BMI 33.00 kg/m   Physical Exam  Constitutional: He is oriented to person, place, and time and well-developed, well-nourished, and in no distress.  HENT:  Head: Normocephalic and atraumatic.  Right Ear: External ear normal.  Left Ear: External ear normal.  Nose: Nose normal.  Eyes: Conjunctivae and EOM are normal. Pupils are equal, round, and reactive to light.  Neck: Normal range of motion. Neck supple.  Cardiovascular: Normal rate, regular rhythm, normal heart sounds and intact distal pulses.   Pulmonary/Chest: Effort normal and breath sounds normal.  Abdominal: Soft.  Musculoskeletal: Normal range of motion.  Neurological: He is alert and oriented to person, place, and time. He has normal reflexes. Gait normal. GCS score is 15.  Skin: Skin is warm and dry.  Psychiatric: Mood, memory, affect and judgment normal.    Lab Results  Component Value Date   WBC 7.8 01/03/2016   HGB 15.0 01/03/2016   HCT 42.4 01/03/2016   PLT 213 01/03/2016   GLUCOSE CANCELED 02/19/2016   CHOL CANCELED 02/19/2016   TRIG CANCELED 02/19/2016   HDL CANCELED 02/19/2016   LDLCALC 83 02/19/2016   TSH CANCELED 02/19/2016   PSA 0.9 09/19/2014   INR 0.93 06/16/2011   HGBA1C CANCELED 02/19/2016   MICROALBUR neg 03/11/2016    CMP     Component Value Date/Time   NA CANCELED 02/19/2016 0813   K CANCELED 02/19/2016 0813   K 4.1 12/28/2012 1508   CL CANCELED 02/19/2016 0813   CO2 CANCELED 02/19/2016 0813   GLUCOSE CANCELED 02/19/2016 0813   GLUCOSE 112 (H) 01/03/2016 0035   BUN CANCELED 02/19/2016 0813   CREATININE CANCELED 02/19/2016 0813   CALCIUM CANCELED 02/19/2016 0813   PROT CANCELED 02/19/2016 0813   ALBUMIN CANCELED 02/19/2016 0813   AST CANCELED 02/19/2016 0813   ALT CANCELED 02/19/2016 0813   ALKPHOS CANCELED 02/19/2016 0813   BILITOT CANCELED 02/19/2016 0813    GFRNONAA 83 02/19/2016 0000   GFRAA 96 02/19/2016 0000    Assessment and Plan :  1. Type 2 diabetes mellitus without complication, without long-term current use of insulin (HCC)  - POCT HgB A1C 6.7 today, little worse. Pt to pay attention to eating. Pt is dealing with death of wife and this will be first holiday season for him More than 50% of visit spent in counselling regarding these issues. 2. Essential hypertension Stable.   3. Hyperlipidemia, unspecified  hyperlipidemia type  4.Chronic Cervical and LS Radiculopathy Seeing Dr Arnoldo Morale from neurosurgery and Dr Primus Bravo from pain management he recently had myelogram and left arm ENG.he is pleased with recent change to new pain specialist. I am encouraged this can help his day to day quality of life. 5.Depression Doing well considering the sudden death of his wife earlier this year. They were very close. We discussed the upcoming holidays. HPI, Exam, and A&P Transcribed under the direction and in the presence of Richard L. Cranford Mon, MD  Electronically Signed: Webb Laws, [place] MD [facility] 06/17/2016 4:38 PM

## 2016-06-23 ENCOUNTER — Other Ambulatory Visit: Payer: Self-pay | Admitting: Family Medicine

## 2016-06-24 ENCOUNTER — Encounter: Payer: Self-pay | Admitting: Neurology

## 2016-06-24 ENCOUNTER — Ambulatory Visit (INDEPENDENT_AMBULATORY_CARE_PROVIDER_SITE_OTHER): Payer: 59 | Admitting: Neurology

## 2016-06-24 VITALS — BP 137/85 | HR 84 | Ht 70.0 in | Wt 234.0 lb

## 2016-06-24 DIAGNOSIS — M542 Cervicalgia: Secondary | ICD-10-CM | POA: Diagnosis not present

## 2016-06-24 DIAGNOSIS — G8929 Other chronic pain: Secondary | ICD-10-CM

## 2016-06-24 MED ORDER — BACLOFEN 10 MG PO TABS: 5.0000 mg | ORAL_TABLET | Freq: Three times a day (TID) | ORAL | 2 refills | Status: DC

## 2016-06-24 NOTE — Progress Notes (Signed)
Reason for visit: Left arm pain  Referring physician: Dr. Wyonia Hough is a 55 y.o. male  History of present illness:  Jeff [name] is a 55 year old right-handed white male with a history of cervical spine surgery at the C6-7 level around January 2012. The patient has had some discomfort in the left side of the neck, shoulder and down the left arm since that time. The patient has some intermittent paresthesias that go down the arm as well. The pain is always present, however and is associated with a burning quality. The patient reports no definite weakness of the extremity. He has no discomfort down the right arm. He does have diabetes any has some pain in both feet. He has also had surgery at the thoracic level 10 and 11. He does have some back pain and leg discomfort as well. The patient has recently undergone EMG and nerve conduction study that did not show evidence of a neuropathy or a cervical radiculopathy on the left. A cervical myelogram with CT to follow did not show evidence of significant nerve root impingement or spinal cord impingement. The patient has some slight increase in discomfort down the left arm with turning the head to the left. He has neck stiffness. He has undergone epidural steroid injections in the neck in the past without benefit. He is followed through a pain center with Dr. Josefa Half. The patient reports no difficulty controlling the bowels or the bladder, he has not had any recent falls but he does note some slight imbalance.  Past Medical History:  Diagnosis Date  . Cancer ([place])    SKIN  . Depression   . Diabetes mellitus without complication ([place])   . Fatigue   . Hyperlipidemia   . Hypertension   . Hypogonadism male   . Neuromuscular disorder ([place])    "back nerve stimulator"  . Neuropathy ([place])   . PAD (peripheral artery disease) ([place])   . Sleep apnea    CPAP  . Tinnitus   . Tuberculosis    POSITIVE  TB SKIN TEST 1992.6 MTH TX .was exposed to  someone who had it.    Past Surgical History:  Procedure Laterality Date  . BACK SURGERY  2012   neck was 2012,back same year. plate in 624THL  . CARDIAC CATHETERIZATION  01/31/2013   Medical management  . CARPAL TUNNEL RELEASE    . CATARACT EXTRACTION Right 2014  . CATARACT EXTRACTION W/PHACO Left 03/12/2016   Procedure: CATARACT EXTRACTION PHACO AND INTRAOCULAR LENS PLACEMENT (IOC);  Surgeon: Birder Robson, MD;  Location: [facility] ORS;  Service: Ophthalmology;  Laterality: Left;  [place] 00:31AP% 17.9CDE 5.67Fluid pack lot # [id number]  . CERVICAL FUSION  2012  . CORONARY ANGIOPLASTY  2014   all good  . KNEE ARTHROSCOPY    . LEFT HEART CATHETERIZATION WITH CORONARY ANGIOGRAM N/A 01/31/2013   Procedure: LEFT HEART CATHETERIZATION WITH CORONARY ANGIOGRAM;  Surgeon: Minus Breeding, MD;  Location: [facility] CATH LAB;  Service: Cardiovascular;  Laterality: N/A;  . LUMBAR LAMINECTOMY/DECOMPRESSION MICRODISCECTOMY  06/22/2011   Procedure: LUMBAR LAMINECTOMY/DECOMPRESSION MICRODISCECTOMY;  Surgeon: Ophelia Charter;  Location: [facility]URO ORS;  Service: Neurosurgery;  Laterality: N/A;  Thoracic Ten-Eleven,Thoracic Eleven-Twelve Laminectomy  . VASECTOMY  1991    Family History  Problem Relation Age of Onset  . Coronary artery disease Father     PPM in his late 24s, CAD Dx 9s  . Hyperlipidemia Father   . Hypertension Father   . Heart disease Father  CABG at age 46  . Diabetes Father   . Atrial fibrillation Mother   . Hypertension Mother   . Transient ischemic attack Mother   . Dementia Mother   . Migraines Daughter   . Cancer Maternal Uncle     Throat  . Cancer Maternal Grandmother     Lung cancer    Social history:  reports that he quit smoking about 7 years ago. His smoking use included Cigarettes. He has a 45.00 pack-year smoking history. He quit smokeless tobacco use about 20 years ago. He reports that he does not drink alcohol or use drugs.  Medications:  Prior to Admission  medications   Medication Sig Start Date End Date Taking? Authorizing Provider  aspirin 81 MG tablet Take 81 mg by mouth daily.   Yes Historical Provider, MD  atorvastatin (LIPITOR) 10 MG tablet TAKE 1 TABLET BY MOUTH AT BEDTIME 09/16/15  Yes Richard Maceo Pro., MD  cyclobenzaprine (FLEXERIL) 10 MG tablet Take 1 tablet (10 mg total) by mouth 3 (three) times daily as needed for muscle spasms. 02/18/16  Yes Richard Maceo Pro., MD  glucose blood [facility] VERIO) test strip Use as instructed 08/14/15  Yes Richard Maceo Pro., MD  hydrochlorothiazide (HYDRODIURIL) 25 MG tablet TAKE 1 TABLET BY MOUTH EVERY DAY. *INSURANCE ONLY COVERS 30 DAYS** 04/27/16  Yes Jerrol Banana., MD  losartan (COZAAR) 100 MG tablet Take 1 tablet (100 mg total) by mouth daily. 02/05/16  Yes Richard Maceo Pro., MD  metFORMIN (GLUCOPHAGE) 1000 MG tablet TAKE 1 TABLET BY MOUTH TWICE A DAY WITH A MEAL 06/23/16  Yes Jerrol Banana., MD  metoprolol succinate (TOPROL-XL) 25 MG 24 hr tablet Take 1 tablet (25 mg total) by mouth daily. 01/08/16  Yes Richard Maceo Pro., MD  Oxycodone HCl 10 MG TABS Take 1 tablet (10 mg total) by mouth every 4 (four) hours as needed. 03/26/16  Yes Richard Maceo Pro., MD  pregabalin (LYRICA) 150 MG capsule Take 1 capsule (150 mg total) by mouth 3 (three) times daily. 03/11/16  Yes Richard Maceo Pro., MD  Venlafaxine HCl 150 MG TB24 TAKE 1 TABLET BY MOUTH EVERY DAY 04/27/16  Yes Jerrol Banana., MD  zolpidem (AMBIEN) 10 MG tablet Take 10 mg by mouth at bedtime as needed for sleep.   Yes Historical Provider, MD      Allergies  Allergen Reactions  . Cymbalta [Duloxetine Hcl] Other (See Comments)    "jittery" and "loopy" per pt  . Neurontin [Gabapentin] Other (See Comments)    caused severe tremor "jittery" and "loopy" per pt.  . Topamax  [Topiramate] Other (See Comments)    unresponsive  episodes  . Keppra [Levetiracetam] Other (See Comments) and Palpitations    "jittery", and  "loopy." per pt.    ROS:  Out of a complete 14 system review of symptoms, the patient complains only of the following symptoms, and all other reviewed systems are negative.  Fatigue Ringing in the ears Birthmarks, moles Increased thirst Headache Nonenhanced sleep, decreased energy Sleepiness, restless legs  Blood pressure 137/85, pulse 84, height 5\' 10"  (1.778 m), weight 234 lb (106.1 kg).  Physical Exam  General: The patient is alert and cooperative at the time of the examination.  Eyes: Pupils are equal, round, and reactive to light. Discs are flat bilaterally.  Neck: The neck is supple, no carotid bruits are noted.  Respiratory: The respiratory examination is clear.  Cardiovascular: The cardiovascular  examination reveals a regular rate and rhythm, no obvious murmurs or rubs are noted.  Neuromuscular: The patient lacks about 20 of full lateral rotation of the cervical spine bilaterally.  Skin: Extremities are without significant edema.  Neurologic Exam  Mental status: The patient is alert and oriented x 3 at the time of the examination. The patient has apparent normal recent and remote memory, with an apparently normal attention span and concentration ability.  Cranial nerves: Facial symmetry is present. There is good sensation of the face to pinprick and soft touch bilaterally. The strength of the facial muscles and the muscles to head turning and shoulder shrug are normal bilaterally. Speech is well enunciated, no aphasia or dysarthria is noted. Extraocular movements are full. Visual fields are full. The tongue is midline, and the patient has symmetric elevation of the soft palate. No obvious hearing deficits are noted.  Motor: The motor testing reveals 5 over 5 strength of all 4 extremities. Good symmetric motor tone is noted throughout.  Sensory: Sensory testing is intact to pinprick, soft touch, vibration sensation, and position sense on all 4 extremities, with the  exception of a stocking pattern pinprick sensory deficit across the ankle on the left, not present on the right. No evidence of extinction is noted.  Coordination: Cerebellar testing reveals good finger-nose-finger and heel-to-shin bilaterally.  Gait and station: Gait is normal. Tandem gait is normal. Romberg is negative. No drift is seen.  Reflexes: Deep tendon reflexes are symmetric, but are depressed bilaterally. Toes are downgoing bilaterally.   Cervical myelogram/CT 05/08/16:  IMPRESSION: No cause of the presenting symptoms is identified.  Good appearance at the fusion level of C6-7.  Mild uncovertebral hypertrophy at C3-4, C4-5 and C5-6 but without significant canal or foraminal narrowing.  Bilateral facet degeneration at C7-T1 with mild osteophytic encroachment upon the foramina but no apparent compressive stenosis.   Assessment/Plan:  1. Chronic neck and left shoulder and arm discomfort  The patient has had no evidence of nerve root impingement by EMG or by cervical myelogram. The patient still has ongoing discomfort in the left shoulder and arm. The patient will be set up for physical therapy, he will be placed on baclofen taking 5 mg 3 times daily, and stop the Flexeril. The patient will follow-up in about 4 months. The baclofen dose can be increased over time. There is no evidence for surgical management, the patient will require medical therapy and physical therapy for long-term pain management. He takes oxycodone 4 times daily currently.  Jill Alexanders MD 06/24/2016 3:09 PM  [facility] [address] [place], [place] [address]  Phone [phone] Fax [phone]

## 2016-06-24 NOTE — Patient Instructions (Signed)
   We will start baclofen 5 mg (1/2 tablet) three times a day.  We will start physical therapy.

## 2016-06-29 ENCOUNTER — Telehealth: Payer: Self-pay | Admitting: Neurology

## 2016-06-29 NOTE — Telephone Encounter (Signed)
Pt was instructed to stop Flexeril when starting baclofen. Called and left VM mssg for pt to return call. Taking baclofen along w/ Venlafaxine may increase side effects such as dizziness, drowsiness, confusion, and difficulty concentrating. Pt should avoid or limit the use of alcohol while being treated with these medications. Advised pt to avoid activities requiring mental alertness such as driving or operating hazardous machinery until he knows how the medications affect him. May call back to discuss further or w/ any additional questions.

## 2016-06-29 NOTE — Telephone Encounter (Signed)
Pt called said [facility] advised him baclofen should not be taken with Venlafaxine HCl 150 MG TB24 and cyclobenzaprine (FLEXERIL) 10 MG tablet . Please call the pt

## 2016-08-17 ENCOUNTER — Other Ambulatory Visit: Payer: Self-pay | Admitting: Family Medicine

## 2016-08-17 ENCOUNTER — Other Ambulatory Visit: Payer: Self-pay | Admitting: Pain Medicine

## 2016-08-17 DIAGNOSIS — E1340 Other specified diabetes mellitus with diabetic neuropathy, unspecified: Secondary | ICD-10-CM | POA: Diagnosis not present

## 2016-08-17 DIAGNOSIS — M5412 Radiculopathy, cervical region: Secondary | ICD-10-CM | POA: Diagnosis not present

## 2016-08-17 DIAGNOSIS — E118 Type 2 diabetes mellitus with unspecified complications: Secondary | ICD-10-CM

## 2016-08-17 DIAGNOSIS — I70209 Unspecified atherosclerosis of native arteries of extremities, unspecified extremity: Secondary | ICD-10-CM | POA: Diagnosis not present

## 2016-08-20 ENCOUNTER — Other Ambulatory Visit: Payer: Self-pay | Admitting: Family Medicine

## 2016-09-13 ENCOUNTER — Other Ambulatory Visit: Payer: Self-pay | Admitting: Family Medicine

## 2016-09-13 ENCOUNTER — Other Ambulatory Visit: Payer: Self-pay | Admitting: Neurology

## 2016-09-14 ENCOUNTER — Other Ambulatory Visit: Payer: Self-pay | Admitting: Pain Medicine

## 2016-09-14 DIAGNOSIS — I70209 Unspecified atherosclerosis of native arteries of extremities, unspecified extremity: Secondary | ICD-10-CM | POA: Diagnosis not present

## 2016-09-14 DIAGNOSIS — E1340 Other specified diabetes mellitus with diabetic neuropathy, unspecified: Secondary | ICD-10-CM | POA: Diagnosis not present

## 2016-09-14 DIAGNOSIS — M5412 Radiculopathy, cervical region: Secondary | ICD-10-CM | POA: Diagnosis not present

## 2016-09-17 ENCOUNTER — Encounter: Payer: Self-pay | Admitting: Family Medicine

## 2016-09-17 ENCOUNTER — Ambulatory Visit (INDEPENDENT_AMBULATORY_CARE_PROVIDER_SITE_OTHER): Payer: 59 | Admitting: Family Medicine

## 2016-09-17 VITALS — BP 148/92 | HR 86 | Temp 97.8°F | Resp 16 | Wt 240.0 lb

## 2016-09-17 DIAGNOSIS — R0602 Shortness of breath: Secondary | ICD-10-CM | POA: Diagnosis not present

## 2016-09-17 DIAGNOSIS — I10 Essential (primary) hypertension: Secondary | ICD-10-CM | POA: Diagnosis not present

## 2016-09-17 DIAGNOSIS — E119 Type 2 diabetes mellitus without complications: Secondary | ICD-10-CM

## 2016-09-17 DIAGNOSIS — F320 Major depressive disorder, single episode, mild: Secondary | ICD-10-CM

## 2016-09-17 LAB — POCT GLYCOSYLATED HEMOGLOBIN (HGB A1C): Hemoglobin A1C: 7.1

## 2016-09-17 MED ORDER — VENLAFAXINE HCL 75 MG PO TABS: 225.0000 mg | ORAL_TABLET | Freq: Every day | ORAL | 11 refills | Status: DC

## 2016-09-17 NOTE — Patient Instructions (Signed)
Call human resources about employee assistant program for counseling.

## 2016-09-17 NOTE — Progress Notes (Signed)
Subjective:  HPI  Diabetes Mellitus Type II, Follow-up:   Lab Results  Component Value Date   HGBA1C 6.7 06/17/2016   HGBA1C CANCELED 02/19/2016   HGBA1C 6.0 (H) 02/19/2016    Last seen for diabetes 3 months ago.  Management since then includes none. He reports good compliance with treatment. He is not having side effects.  Home blood sugar records: checks blood sugar sometimes but not often  Pertinent Labs:    Component Value Date/Time   CHOL CANCELED 02/19/2016 0813   TRIG CANCELED 02/19/2016 0813   HDL CANCELED 02/19/2016 0813   LDLCALC 83 02/19/2016 0000   CREATININE CANCELED 02/19/2016 0813    Wt Readings from Last 3 Encounters:  09/17/16 240 lb (108.9 kg)  06/24/16 234 lb (106.1 kg)  06/17/16 230 lb (104.3 kg)    ------------------------------------------------------------------------  Depression- Pt reports that he just does not have the desire to go out and do anything. When he gets off work he just wants to go home and go to bed. He reports that his daughter has said he needs to talk about this today. He reports that some of it is his back hurts and he does not feel like doing anything.   Prior to Admission medications   Medication Sig Start Date End Date Taking? Authorizing Provider  aspirin 81 MG tablet Take 81 mg by mouth daily.    Historical Provider, MD  atorvastatin (LIPITOR) 10 MG tablet TAKE 1 TABLET BY MOUTH AT BEDTIME 09/14/16   Dareon Nunziato Maceo Pro., MD  baclofen (LIORESAL) 10 MG tablet TAKE 1/2 TABLETS (5 MG TOTAL) BY MOUTH 3 (THREE) TIMES DAILY. 09/14/16   Kathrynn Ducking, MD  glucose blood [facility] VERIO) test strip Check sugar once daily DX E11.9 08/17/16   Asianae Minkler Maceo Pro., MD  hydrochlorothiazide (HYDRODIURIL) 25 MG tablet TAKE 1 TABLET BY MOUTH EVERY DAY. *INSURANCE ONLY COVERS 30 DAYS** 04/27/16   Chaz Mcglasson Maceo Pro., MD  losartan (COZAAR) 100 MG tablet Take 1 tablet (100 mg total) by mouth daily. 02/05/16   Jerrol Banana., MD    metFORMIN (GLUCOPHAGE) 1000 MG tablet TAKE 1 TABLET BY MOUTH TWICE A DAY WITH A MEAL 06/23/16   Vilma Will Maceo Pro., MD  metoprolol succinate (TOPROL-XL) 25 MG 24 hr tablet Take 1 tablet (25 mg total) by mouth daily. 01/08/16   Ossie Yebra Maceo Pro., MD  Oxycodone HCl 10 MG TABS Take 1 tablet (10 mg total) by mouth every 4 (four) hours as needed. 03/26/16   Joelie Schou Maceo Pro., MD  pregabalin (LYRICA) 150 MG capsule Take 1 capsule (150 mg total) by mouth 3 (three) times daily. 03/11/16   Jerrol Banana., MD  Venlafaxine HCl 150 MG TB24 TAKE 1 TABLET BY MOUTH EVERY DAY 04/27/16   Jerrol Banana., MD  zolpidem (AMBIEN) 10 MG tablet TAKE 1 TABLET BY MOUTH EVERY NIGHT AT BEDTIME AS NEEDED FOR SLEEP 08/21/16   Jerrol Banana., MD    Patient Active Problem List   Diagnosis Date Noted  . DDD (degenerative disc disease), cervical 02/25/2016  . H/O cervical spine surgery 02/25/2016  . Cervical facet syndrome 02/25/2016  . DDD (degenerative disc disease), lumbar 02/25/2016  . Facet syndrome, lumbar 02/25/2016  . Sacroiliac joint dysfunction 02/25/2016  . Atherosclerotic peripheral vascular disease ([place]) 02/25/2016  . Abnormal kidney function 12/06/2014  . Cervical nerve root disorder 12/06/2014  . Colon polyp 12/06/2014  . Clinical depression 12/06/2014  .  Essential (primary) hypertension 12/06/2014  . Cephalalgia 12/06/2014  . Bulge of cervical disc without myelopathy 12/06/2014  . HLD (hyperlipidemia) 12/06/2014  . 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 Displacement of lumbar intervertebral disc without myelopathy 12/06/2014  . L-S radiculopathy 12/06/2014  . Lyme disease 12/06/2014  . Mild major depression ([place]) 12/06/2014  . Neuropathy ([place]) 12/06/2014  . Adiposity 12/06/2014  . Peripheral vascular disease ([place]) 12/06/2014  . Diabetes mellitus, type 2 ([place]) 12/06/2014  . Cervical post-laminectomy syndrome 10/04/2013  . Chronic neck pain 10/04/2013  . Peripheral neuropathic pain  10/04/2013  . Failed back syndrome of thoracic spine 10/04/2013  . Fatigue 03/17/2013  . Hypokalemia 01/30/2013  . Positive D dimer 01/30/2013  . Chest pain 01/28/2013  . Seizures ([place]) 01/28/2013  . Syncope 01/28/2013  . Chronic pain associated with significant psychosocial dysfunction 08/17/2012  . Polypharmacy 08/02/2012  . 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 Post laminectomy syndrome 08/02/2012  . Thoracic spinal stenosis 06/22/2011    Past Medical History:  Diagnosis Date  . Cancer ([place])    SKIN  . Depression   . Diabetes mellitus without complication ([place])   . Fatigue   . Hyperlipidemia   . Hypertension   . Hypogonadism male   . Neuromuscular disorder ([place])    "back nerve stimulator"  . Neuropathy ([place])   . PAD (peripheral artery disease) ([place])   . Sleep apnea    CPAP  . Tinnitus   . Tuberculosis    POSITIVE  TB SKIN TEST 1992.6 MTH TX .was exposed to someone who had it.    Social History   Social History  . Marital status: Widowed    Spouse name: N/A  . Number of children: 3  . Years of education: 12   Occupational History  . works at [employer]   . [place] History Main Topics  . Smoking status: Former Smoker    Packs/day: 1.50    Years: 30.00    Types: Cigarettes    Quit date: 2010  . Smokeless tobacco: Former Systems developer    Quit date: 12/16/1995  . Alcohol use No     Comment: Beer  . Drug use: No  . Sexual activity: Yes    Birth control/ protection: None   Other Topics Concern  . Not on file   Social History Narrative   Lives at home alone, widowed   Right-handed   Caffeine: tea and soft drinks       Allergies  Allergen Reactions  . Cymbalta [Duloxetine Hcl] Other (See Comments)    "jittery" and "loopy" per pt  . Neurontin [Gabapentin] Other (See Comments)    caused severe tremor "jittery" and "loopy" per pt.  . Topamax  [Topiramate] Other (See Comments)    unresponsive  episodes  . Keppra  [Levetiracetam] Other (See Comments) and Palpitations    "jittery", and "loopy." per pt.    Review of Systems  Constitutional: Negative.   HENT: Negative.   Eyes: Negative.   Respiratory: Positive for shortness of breath.   Cardiovascular: Negative.   Gastrointestinal: Negative.   Genitourinary: Negative.   Musculoskeletal: Positive for back pain.  Skin: Negative.   Neurological: Negative.   Endo/Heme/Allergies: Negative.   Psychiatric/Behavioral: Positive for depression.    Immunization History  Administered Date(s) Administered  . Influenza,inj,Quad PF,36+ Mos 05/14/2015  . Influenza-Unspecified 04/16/2016  . Pneumococcal Polysaccharide-23 01/21/2012  . Tdap 01/21/2012    Objective:  BP (!) 148/92 (BP Location: Left Arm,  Patient Position: Sitting, Cuff Size: Normal)   Pulse 86   Temp 97.8 F (36.6 C) (Oral)   Resp 16   Wt 240 lb (108.9 kg)   BMI 34.44 kg/m   Physical Exam  Constitutional: He is oriented to person, place, and time and well-developed, well-nourished, and in no distress.  HENT:  Head: Normocephalic and atraumatic.  Right Ear: External ear normal.  Left Ear: External ear normal.  Nose: Nose normal.  Eyes: Conjunctivae and EOM are normal. Pupils are equal, round, and reactive to light.  Neck: Normal range of motion. Neck supple.  Cardiovascular: Normal rate, regular rhythm, normal heart sounds and intact distal pulses.   Pulmonary/Chest: Effort normal and breath sounds normal.  Musculoskeletal: Normal range of motion.  Neurological: He is alert and oriented to person, place, and time. He has normal reflexes. Gait normal. GCS score is 15.  Skin: Skin is warm and dry.  Patient very fair skin  Psychiatric: Memory, affect and judgment normal.    Lab Results  Component Value Date   WBC 7.8 01/03/2016   HGB 15.0 01/03/2016   HCT 42.4 01/03/2016   PLT 213 01/03/2016   GLUCOSE CANCELED 02/19/2016   CHOL CANCELED 02/19/2016   TRIG CANCELED 02/19/2016    HDL CANCELED 02/19/2016   LDLCALC 83 02/19/2016   TSH CANCELED 02/19/2016   PSA 0.9 09/19/2014   INR 0.93 06/16/2011   HGBA1C 6.7 06/17/2016   MICROALBUR neg 03/11/2016    CMP     Component Value Date/Time   NA CANCELED 02/19/2016 0813   K CANCELED 02/19/2016 0813   K 4.1 12/28/2012 1508   CL CANCELED 02/19/2016 0813   CO2 CANCELED 02/19/2016 0813   GLUCOSE CANCELED 02/19/2016 0813   GLUCOSE 112 (H) 01/03/2016 0035   BUN CANCELED 02/19/2016 0813   CREATININE CANCELED 02/19/2016 0813   CALCIUM CANCELED 02/19/2016 0813   PROT CANCELED 02/19/2016 0813   ALBUMIN CANCELED 02/19/2016 0813   AST CANCELED 02/19/2016 0813   ALT CANCELED 02/19/2016 0813   ALKPHOS CANCELED 02/19/2016 0813   BILITOT CANCELED 02/19/2016 0813   GFRNONAA 83 02/19/2016 0000   GFRAA 96 02/19/2016 0000    Assessment and Plan :  1. Type 2 diabetes mellitus without complication, without long-term current use of insulin (HCC)  - POCT HgB A1C--7.1 today.  2. Essential hypertension   3. Mild major depression (HCC) PHQ9 is 14 today. Increse Effexor to 225 mg daily.  4. Shortness of breath Pulse ox is 96% and ECG WNL. Deconditioning most likely. Cardiology referral if this persists or worsens. - venlafaxine (EFFEXOR) 75 MG tablet; Take 3 tablets (225 mg total) by mouth daily.  Dispense: 90 tablet; Refill: 11 - EKG 12-Lead I have done the exam and reviewed the chart and it is accurate to the best of my knowledge. Development worker, community has been used and  any errors in dictation or transcription are unintentional. Miguel Aschoff M.D. [facility]   HPI, Exam, and A&P Transcribed under the direction and in the presence of Tracker Mance L. Cranford Mon, MD  Electronically Signed: Katina Dung, [place] MD [facility] 09/17/2016 4:17 PM

## 2016-10-12 DIAGNOSIS — E1340 Other specified diabetes mellitus with diabetic neuropathy, unspecified: Secondary | ICD-10-CM | POA: Diagnosis not present

## 2016-10-12 DIAGNOSIS — M5412 Radiculopathy, cervical region: Secondary | ICD-10-CM | POA: Diagnosis not present

## 2016-10-12 DIAGNOSIS — G894 Chronic pain syndrome: Secondary | ICD-10-CM | POA: Diagnosis not present

## 2016-10-12 DIAGNOSIS — Z79891 Long term (current) use of opiate analgesic: Secondary | ICD-10-CM | POA: Diagnosis not present

## 2016-10-12 DIAGNOSIS — I70209 Unspecified atherosclerosis of native arteries of extremities, unspecified extremity: Secondary | ICD-10-CM | POA: Diagnosis not present

## 2016-10-15 ENCOUNTER — Encounter: Payer: Self-pay | Admitting: Family Medicine

## 2016-10-15 ENCOUNTER — Ambulatory Visit (INDEPENDENT_AMBULATORY_CARE_PROVIDER_SITE_OTHER): Payer: 59 | Admitting: Family Medicine

## 2016-10-15 VITALS — BP 158/90 | HR 76 | Temp 97.6°F | Resp 16 | Wt 236.0 lb

## 2016-10-15 DIAGNOSIS — I10 Essential (primary) hypertension: Secondary | ICD-10-CM | POA: Diagnosis not present

## 2016-10-15 DIAGNOSIS — R0602 Shortness of breath: Secondary | ICD-10-CM

## 2016-10-15 DIAGNOSIS — F320 Major depressive disorder, single episode, mild: Secondary | ICD-10-CM

## 2016-10-15 MED ORDER — METOPROLOL SUCCINATE ER 50 MG PO TB24: 50.0000 mg | ORAL_TABLET | Freq: Every day | ORAL | 11 refills | Status: DC

## 2016-10-15 MED ORDER — VENLAFAXINE HCL 75 MG PO TABS: 150.0000 mg | ORAL_TABLET | Freq: Every day | ORAL | 11 refills | Status: DC

## 2016-10-15 MED ORDER — ARIPIPRAZOLE 5 MG PO TABS: 5.0000 mg | ORAL_TABLET | Freq: Every day | ORAL | 11 refills | Status: DC

## 2016-10-15 NOTE — Patient Instructions (Signed)
Decrease back to 150 mg of Effexor. Start Abilify and increase Metoprolol

## 2016-10-15 NOTE — Progress Notes (Signed)
Subjective:  HPI Depression- Pt is here for a 1 month follow up. His Effexor was increased to 225 mg last OV. He reports that he is not feeling any better. He still feels like he does not want to do anything other than work and go home. PHQ 9 score was 14.  He reports his BP has been elevated at home for 140's/90's at home. It is elevated today at 158/90. His shortness of breath is about the same.   Prior to Admission medications   Medication Sig Start Date End Date Taking? Authorizing Provider  aspirin 81 MG tablet Take 81 mg by mouth daily.    Historical Provider, MD  atorvastatin (LIPITOR) 10 MG tablet TAKE 1 TABLET BY MOUTH AT BEDTIME 09/14/16   Richard Maceo Pro., MD  baclofen (LIORESAL) 10 MG tablet TAKE 1/2 TABLETS (5 MG TOTAL) BY MOUTH 3 (THREE) TIMES DAILY. Patient not taking: Reported on 09/17/2016 09/14/16   Kathrynn Ducking, MD  glucose blood [facility] VERIO) test strip Check sugar once daily DX E11.9 08/17/16   Jerrol Banana., MD  hydrochlorothiazide (HYDRODIURIL) 25 MG tablet TAKE 1 TABLET BY MOUTH EVERY DAY. *INSURANCE ONLY COVERS 30 DAYS** 04/27/16   Richard Maceo Pro., MD  losartan (COZAAR) 100 MG tablet Take 1 tablet (100 mg total) by mouth daily. 02/05/16   Jerrol Banana., MD  metFORMIN (GLUCOPHAGE) 1000 MG tablet TAKE 1 TABLET BY MOUTH TWICE A DAY WITH A MEAL 06/23/16   Richard Maceo Pro., MD  metoprolol succinate (TOPROL-XL) 25 MG 24 hr tablet Take 1 tablet (25 mg total) by mouth daily. 01/08/16   Richard Maceo Pro., MD  Oxycodone HCl 10 MG TABS Take 1 tablet (10 mg total) by mouth every 4 (four) hours as needed. 03/26/16   Richard Maceo Pro., MD  pregabalin (LYRICA) 150 MG capsule Take 1 capsule (150 mg total) by mouth 3 (three) times daily. 03/11/16   Richard Maceo Pro., MD  venlafaxine (EFFEXOR) 75 MG tablet Take 3 tablets (225 mg total) by mouth daily. 09/17/16   Richard Maceo Pro., MD  zolpidem (AMBIEN) 10 MG tablet TAKE 1 TABLET BY MOUTH  EVERY NIGHT AT BEDTIME AS NEEDED FOR SLEEP 08/21/16   Jerrol Banana., MD    Patient Active Problem List   Diagnosis Date Noted  . DDD (degenerative disc disease), cervical 02/25/2016  . H/O cervical spine surgery 02/25/2016  . Cervical facet syndrome 02/25/2016  . DDD (degenerative disc disease), lumbar 02/25/2016  . Facet syndrome, lumbar 02/25/2016  . Sacroiliac joint dysfunction 02/25/2016  . Atherosclerotic peripheral vascular disease ([place]) 02/25/2016  . Abnormal kidney function 12/06/2014  . Cervical nerve root disorder 12/06/2014  . Colon polyp 12/06/2014  . Clinical depression 12/06/2014  . Essential (primary) hypertension 12/06/2014  . Cephalalgia 12/06/2014  . Bulge of cervical disc without myelopathy 12/06/2014  . HLD (hyperlipidemia) 12/06/2014  . 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 Displacement of lumbar intervertebral disc without myelopathy 12/06/2014  . L-S radiculopathy 12/06/2014  . Lyme disease 12/06/2014  . Mild major depression ([place]) 12/06/2014  . Neuropathy ([place]) 12/06/2014  . Adiposity 12/06/2014  . Peripheral vascular disease ([place]) 12/06/2014  . Diabetes mellitus, type 2 ([place]) 12/06/2014  . Cervical post-laminectomy syndrome 10/04/2013  . Chronic neck pain 10/04/2013  . Peripheral neuropathic pain 10/04/2013  . Failed back syndrome of thoracic spine 10/04/2013  . Fatigue 03/17/2013  . Hypokalemia 01/30/2013  . Positive D dimer 01/30/2013  .  Chest pain 01/28/2013  . Seizures ([place]) 01/28/2013  . Syncope 01/28/2013  . Chronic pain associated with significant psychosocial dysfunction 08/17/2012  . Polypharmacy 08/02/2012  . 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 Post laminectomy syndrome 08/02/2012  . Thoracic spinal stenosis 06/22/2011    Past Medical History:  Diagnosis Date  . Cancer ([place])    SKIN  . Depression   . Diabetes mellitus without complication ([place])   . Fatigue   . Hyperlipidemia   . Hypertension   . Hypogonadism male   .  Neuromuscular disorder ([place])    "back nerve stimulator"  . Neuropathy ([place])   . PAD (peripheral artery disease) ([place])   . Sleep apnea    CPAP  . Tinnitus   . Tuberculosis    POSITIVE  TB SKIN TEST 1992.6 MTH [place] .was exposed to someone who had it.    Social History   Social History  . Marital status: Widowed    Spouse name: N/A  . Number of children: 3  . Years of education: 12   Occupational History  . works at [employer]   . [place] History Main Topics  . Smoking status: Former Smoker    Packs/day: 1.50    Years: 30.00    Types: Cigarettes    Quit date: 2010  . Smokeless tobacco: Former Systems developer    Quit date: 12/16/1995  . Alcohol use No     Comment: Beer  . Drug use: No  . Sexual activity: Yes    Birth control/ protection: None   Other Topics Concern  . Not on file   Social History Narrative   Lives at home alone, widowed   Right-handed   Caffeine: tea and soft drinks       Allergies  Allergen Reactions  . Cymbalta [Duloxetine Hcl] Other (See Comments)    "jittery" and "loopy" per pt  . Neurontin [Gabapentin] Other (See Comments)    caused severe tremor "jittery" and "loopy" per pt.  . Topamax  [Topiramate] Other (See Comments)    unresponsive  episodes  . Keppra [Levetiracetam] Other (See Comments) and Palpitations    "jittery", and "loopy." per pt.    Review of Systems  Constitutional: Negative.   HENT: Negative.   Eyes: Negative.   Respiratory: Positive for shortness of breath (not new. ).   Cardiovascular: Negative.   Gastrointestinal: Negative.   Genitourinary: Negative.   Musculoskeletal: Positive for back pain.  Skin: Negative.   Neurological: Negative.   Endo/Heme/Allergies: Negative.   Psychiatric/Behavioral: Positive for depression.  Predictable DOE.  Immunization History  Administered Date(s) Administered  . Influenza,inj,Quad PF,36+ Mos 05/14/2015  . Influenza-Unspecified 04/16/2016  .  Pneumococcal Polysaccharide-23 01/21/2012  . Tdap 01/21/2012    Objective:  BP (!) 158/90 (BP Location: Left Arm, Patient Position: Sitting, Cuff Size: Normal)   Pulse 76   Temp 97.6 F (36.4 C) (Oral)   Resp 16   Wt 236 lb (107 kg)   SpO2 98%   BMI 33.86 kg/m   Physical Exam  Constitutional: He is oriented to person, place, and time and well-developed, well-nourished, and in no distress.  HENT:  Head: Normocephalic and atraumatic.  Right Ear: External ear normal.  Left Ear: External ear normal.  Nose: Nose normal.  Eyes: Conjunctivae are normal.  Neck: No thyromegaly present.  Cardiovascular: Normal rate, regular rhythm and normal heart sounds.   Pulmonary/Chest: Effort normal and breath sounds normal. No respiratory distress. He has  no wheezes.  Abdominal: Soft.  Neurological: He is alert and oriented to person, place, and time. No cranial nerve deficit. Gait normal. GCS score is 15.  Skin: Skin is warm and dry.  Psychiatric: Mood, memory, affect and judgment normal.    Lab Results  Component Value Date   WBC 7.8 01/03/2016   HGB 15.0 01/03/2016   HCT 42.4 01/03/2016   PLT 213 01/03/2016   GLUCOSE CANCELED 02/19/2016   CHOL CANCELED 02/19/2016   TRIG CANCELED 02/19/2016   HDL CANCELED 02/19/2016   LDLCALC 83 02/19/2016   TSH CANCELED 02/19/2016   PSA 0.9 09/19/2014   INR 0.93 06/16/2011   HGBA1C 7.1 09/17/2016   MICROALBUR neg 03/11/2016    CMP     Component Value Date/Time   NA CANCELED 02/19/2016 0813   K CANCELED 02/19/2016 0813   K 4.1 12/28/2012 1508   CL CANCELED 02/19/2016 0813   CO2 CANCELED 02/19/2016 0813   GLUCOSE CANCELED 02/19/2016 0813   GLUCOSE 112 (H) 01/03/2016 0035   BUN CANCELED 02/19/2016 0813   CREATININE CANCELED 02/19/2016 0813   CALCIUM CANCELED 02/19/2016 0813   PROT CANCELED 02/19/2016 0813   ALBUMIN CANCELED 02/19/2016 0813   AST CANCELED 02/19/2016 0813   ALT CANCELED 02/19/2016 0813   ALKPHOS CANCELED 02/19/2016 0813    BILITOT CANCELED 02/19/2016 0813   GFRNONAA 83 02/19/2016 0000   GFRAA 96 02/19/2016 0000    Assessment and Plan :  1. Shortness of breath Deconditioning most likely. Pt has been very inactive lately.w/u as indicated. 2. Essential (primary) hypertension Increase metoprolol to 50 mg daily and follow up in 1 month.  - metoprolol succinate (TOPROL-XL) 50 MG 24 hr tablet; Take 1 tablet (50 mg total) by mouth daily.  Dispense: 30 tablet; Refill: 11  3. Mild major depression ([place]) Still no better with Effexor increase. BP elevated will decreased Effexor back to previous dose and add Abilify and follow up in 1 month.  - ARIPiprazole (ABILIFY) 5 MG tablet; Take 1 tablet (5 mg total) by mouth daily.  Dispense: 30 tablet; Refill: 11   HPI, Exam, and A&P Transcribed under the direction and in the presence of Richard L. Cranford Mon, MD  Electronically Signed: Katina Dung, [place] MD [facility] 10/15/2016 4:22 PM

## 2016-11-18 ENCOUNTER — Ambulatory Visit (INDEPENDENT_AMBULATORY_CARE_PROVIDER_SITE_OTHER): Payer: 59 | Admitting: Family Medicine

## 2016-11-18 VITALS — BP 138/84 | HR 108 | Temp 97.8°F | Resp 16 | Wt 233.0 lb

## 2016-11-18 DIAGNOSIS — M542 Cervicalgia: Secondary | ICD-10-CM | POA: Diagnosis not present

## 2016-11-18 DIAGNOSIS — M5136 Other intervertebral disc degeneration, lumbar region: Secondary | ICD-10-CM

## 2016-11-18 DIAGNOSIS — I70209 Unspecified atherosclerosis of native arteries of extremities, unspecified extremity: Secondary | ICD-10-CM | POA: Diagnosis not present

## 2016-11-18 DIAGNOSIS — M5417 Radiculopathy, lumbosacral region: Secondary | ICD-10-CM | POA: Diagnosis not present

## 2016-11-18 DIAGNOSIS — Z79891 Long term (current) use of opiate analgesic: Secondary | ICD-10-CM | POA: Diagnosis not present

## 2016-11-18 DIAGNOSIS — M5031 Other cervical disc degeneration,  high cervical region: Secondary | ICD-10-CM | POA: Diagnosis not present

## 2016-11-18 DIAGNOSIS — E118 Type 2 diabetes mellitus with unspecified complications: Secondary | ICD-10-CM | POA: Diagnosis not present

## 2016-11-18 DIAGNOSIS — M5412 Radiculopathy, cervical region: Secondary | ICD-10-CM | POA: Diagnosis not present

## 2016-11-18 DIAGNOSIS — F3341 Major depressive disorder, recurrent, in partial remission: Secondary | ICD-10-CM | POA: Diagnosis not present

## 2016-11-18 DIAGNOSIS — G629 Polyneuropathy, unspecified: Secondary | ICD-10-CM | POA: Diagnosis not present

## 2016-11-18 DIAGNOSIS — E1340 Other specified diabetes mellitus with diabetic neuropathy, unspecified: Secondary | ICD-10-CM | POA: Diagnosis not present

## 2016-11-18 NOTE — Progress Notes (Signed)
Patient: Jeff [name] Male    DOB: [birth year]   56 y.o.   MRN: [id number] Visit Date: 11/18/2016  Today's Provider: [name], MD   Chief Complaint  Patient presents with  . Depression  . Hypertension   Subjective:    HPI     Depression, Follow-up  He  was last seen for this 1 months ago. Changes made at last visit include decreasing Effexor back to previous dose and adding Abilify.   He is having side effects. Slurred speech, decreased memory, leg weakness, falls. Pt is no longer taking Abilify, and is still taking the lower dose of Effexor.  Current symptoms include: depressed mood, difficulty concentrating, fatigue and insomnia He feels he is Unchanged since last visit.  ------------------------------------------------------------------------   Hypertension, follow-up:  BP Readings from Last 3 Encounters:  11/18/16 138/84  10/15/16 (!) 158/90  09/17/16 (!) 148/92    He was last seen for hypertension 1 months ago.  BP at that visit was 158/90. Management since that visit includes increasing Metoprolol to 50 mg. He reports excellent compliance with treatment. He is not having side effects.   Wt Readings from Last 3 Encounters:  11/18/16 233 lb (105.7 kg)  10/15/16 236 lb (107 kg)  09/17/16 240 lb (108.9 kg)     ------------------------------------------------------------------------   Allergies  Allergen Reactions  . Cymbalta [Duloxetine Hcl] Other (See Comments)    "jittery" and "loopy" per pt  . Neurontin [Gabapentin] Other (See Comments)    caused severe tremor "jittery" and "loopy" per pt.  . Topamax  [Topiramate] Other (See Comments)    unresponsive  episodes  . Keppra [Levetiracetam] Other (See Comments) and Palpitations    "jittery", and "loopy." per pt.     Current Outpatient Prescriptions:  .  aspirin 81 MG tablet, Take 81 mg by mouth daily., Disp: , Rfl:  .  atorvastatin (LIPITOR) 10 MG tablet, TAKE 1 TABLET BY MOUTH  AT BEDTIME, Disp: 90 tablet, Rfl: 3 .  glucose blood (ONETOUCH VERIO) test strip, Check sugar once daily DX E11.9, Disp: 100 each, Rfl: 3 .  hydrochlorothiazide (HYDRODIURIL) 25 MG tablet, TAKE 1 TABLET BY MOUTH EVERY DAY. *INSURANCE ONLY COVERS 30 DAYS**, Disp: 90 tablet, Rfl: 3 .  losartan (COZAAR) 100 MG tablet, Take 1 tablet (100 mg total) by mouth daily., Disp: 30 tablet, Rfl: 12 .  metFORMIN (GLUCOPHAGE) 1000 MG tablet, TAKE 1 TABLET BY MOUTH TWICE A DAY WITH A MEAL, Disp: 60 tablet, Rfl: 5 .  metoprolol succinate (TOPROL-XL) 50 MG 24 hr tablet, Take 1 tablet (50 mg total) by mouth daily., Disp: 30 tablet, Rfl: 11 .  Oxycodone HCl 10 MG TABS, Take 1 tablet (10 mg total) by mouth every 4 (four) hours as needed. (Patient taking differently: Take 10 mg by mouth every 4 (four) hours as needed. ), Disp: 100 tablet, Rfl: 0 .  pregabalin (LYRICA) 150 MG capsule, Take 1 capsule (150 mg total) by mouth 3 (three) times daily., Disp: 180 capsule, Rfl: 5 .  venlafaxine (EFFEXOR) 75 MG tablet, Take 2 tablets (150 mg total) by mouth daily., Disp: 90 tablet, Rfl: 11 .  zolpidem (AMBIEN) 10 MG tablet, TAKE 1 TABLET BY MOUTH EVERY NIGHT AT BEDTIME AS NEEDED FOR SLEEP, Disp: 30 tablet, Rfl: 5 .  baclofen (LIORESAL) 10 MG tablet, TAKE 1/2 TABLETS (5 MG TOTAL) BY MOUTH 3 (THREE) TIMES DAILY. (Patient not taking: Reported on 09/17/2016), Disp: 50 tablet, Rfl: 1  Review of  Systems  Constitutional: Positive for fatigue. Negative for activity change, appetite change, chills, diaphoresis, fever and unexpected weight change.  Eyes: Negative.   Respiratory: Negative.   Cardiovascular: Negative for chest pain, palpitations and leg swelling.  Allergic/Immunologic: Negative.   Hematological: Negative.   Psychiatric/Behavioral: Positive for dysphoric mood and sleep disturbance. Negative for suicidal ideas.    Social History  Substance Use Topics  . Smoking status: Former Smoker    Packs/day: 1.50    Years: 30.00     Types: Cigarettes    Quit date: 2010  . Smokeless tobacco: Former Systems developer    Quit date: 12/16/1995  . Alcohol use No     Comment: Beer   Objective:   BP 138/84 (BP Location: Right Arm, Patient Position: Sitting, Cuff Size: Large)   Pulse (!) 108   Temp 97.8 F (36.6 C) (Oral)   Resp 16   Wt 233 lb (105.7 kg)   BMI 33.43 kg/m  Vitals:   11/18/16 1635  BP: 138/84  Pulse: (!) 108  Resp: 16  Temp: 97.8 F (36.6 C)  TempSrc: Oral  Weight: 233 lb (105.7 kg)     Physical Exam  Constitutional: He is oriented to person, place, and time.  HENT:  Head: Normocephalic and atraumatic.  Right Ear: External ear normal.  Left Ear: External ear normal.  Nose: Nose normal.  Eyes: Conjunctivae are normal.  Neck: No thyromegaly present.  Cardiovascular: Normal rate, regular rhythm and normal heart sounds.   Pulmonary/Chest: Effort normal and breath sounds normal.  Abdominal: Soft.  Neurological: He is alert and oriented to person, place, and time.  Skin: Skin is warm and dry.  Psychiatric: He has a normal mood and affect. His behavior is normal. Thought content normal.        Assessment & Plan:     Depression Partial remission. No changes today. Pt considering retirment this year. Chronic Pain/DDD/Spinal Stenosis Pain management--Dr Primus Bravo. TIIDM RTC 3 months    Patient seen and examined by Miguel Aschoff, MD, and note scribed by Renaldo Fiddler, CMA. I have done the exam and reviewed the above chart and it is accurate to the best of my knowledge. Development worker, community has been used in this note in any air is in the dictation or transcription are unintentional.  [name], MD  [place] [facility]

## 2016-11-20 ENCOUNTER — Other Ambulatory Visit: Payer: Self-pay | Admitting: Family Medicine

## 2016-11-20 DIAGNOSIS — M5417 Radiculopathy, lumbosacral region: Secondary | ICD-10-CM

## 2016-11-30 DIAGNOSIS — D485 Neoplasm of uncertain behavior of skin: Secondary | ICD-10-CM | POA: Diagnosis not present

## 2016-11-30 DIAGNOSIS — Z85828 Personal history of other malignant neoplasm of skin: Secondary | ICD-10-CM | POA: Diagnosis not present

## 2016-11-30 DIAGNOSIS — L570 Actinic keratosis: Secondary | ICD-10-CM | POA: Diagnosis not present

## 2016-11-30 DIAGNOSIS — Z1283 Encounter for screening for malignant neoplasm of skin: Secondary | ICD-10-CM | POA: Diagnosis not present

## 2016-12-05 ENCOUNTER — Other Ambulatory Visit: Payer: Self-pay | Admitting: Family Medicine

## 2016-12-07 DIAGNOSIS — S40862A Insect bite (nonvenomous) of left upper arm, initial encounter: Secondary | ICD-10-CM | POA: Diagnosis not present

## 2016-12-07 DIAGNOSIS — S30860A Insect bite (nonvenomous) of lower back and pelvis, initial encounter: Secondary | ICD-10-CM | POA: Diagnosis not present

## 2016-12-07 DIAGNOSIS — L0390 Cellulitis, unspecified: Secondary | ICD-10-CM | POA: Diagnosis not present

## 2016-12-15 DIAGNOSIS — M5412 Radiculopathy, cervical region: Secondary | ICD-10-CM | POA: Diagnosis not present

## 2016-12-15 DIAGNOSIS — I70209 Unspecified atherosclerosis of native arteries of extremities, unspecified extremity: Secondary | ICD-10-CM | POA: Diagnosis not present

## 2016-12-15 DIAGNOSIS — E1340 Other specified diabetes mellitus with diabetic neuropathy, unspecified: Secondary | ICD-10-CM | POA: Diagnosis not present

## 2017-01-12 DIAGNOSIS — M5031 Other cervical disc degeneration,  high cervical region: Secondary | ICD-10-CM | POA: Diagnosis not present

## 2017-01-12 DIAGNOSIS — M542 Cervicalgia: Secondary | ICD-10-CM | POA: Diagnosis not present

## 2017-01-12 DIAGNOSIS — E1340 Other specified diabetes mellitus with diabetic neuropathy, unspecified: Secondary | ICD-10-CM | POA: Diagnosis not present

## 2017-01-12 DIAGNOSIS — I70209 Unspecified atherosclerosis of native arteries of extremities, unspecified extremity: Secondary | ICD-10-CM | POA: Diagnosis not present

## 2017-01-12 DIAGNOSIS — Z79891 Long term (current) use of opiate analgesic: Secondary | ICD-10-CM | POA: Diagnosis not present

## 2017-01-12 DIAGNOSIS — M5412 Radiculopathy, cervical region: Secondary | ICD-10-CM | POA: Diagnosis not present

## 2017-02-05 ENCOUNTER — Other Ambulatory Visit: Payer: Self-pay | Admitting: Family Medicine

## 2017-02-05 DIAGNOSIS — I10 Essential (primary) hypertension: Secondary | ICD-10-CM

## 2017-02-09 DIAGNOSIS — M5031 Other cervical disc degeneration,  high cervical region: Secondary | ICD-10-CM | POA: Diagnosis not present

## 2017-02-09 DIAGNOSIS — M542 Cervicalgia: Secondary | ICD-10-CM | POA: Diagnosis not present

## 2017-02-09 DIAGNOSIS — Z79891 Long term (current) use of opiate analgesic: Secondary | ICD-10-CM | POA: Diagnosis not present

## 2017-02-09 DIAGNOSIS — E1340 Other specified diabetes mellitus with diabetic neuropathy, unspecified: Secondary | ICD-10-CM | POA: Diagnosis not present

## 2017-02-09 DIAGNOSIS — I70209 Unspecified atherosclerosis of native arteries of extremities, unspecified extremity: Secondary | ICD-10-CM | POA: Diagnosis not present

## 2017-02-09 DIAGNOSIS — M5412 Radiculopathy, cervical region: Secondary | ICD-10-CM | POA: Diagnosis not present

## 2017-02-25 ENCOUNTER — Other Ambulatory Visit: Payer: Self-pay | Admitting: Family Medicine

## 2017-02-26 NOTE — Telephone Encounter (Signed)
Dr. Rosanna Randy patient

## 2017-02-26 NOTE — Telephone Encounter (Signed)
rx called in-aa 

## 2017-03-09 DIAGNOSIS — I70209 Unspecified atherosclerosis of native arteries of extremities, unspecified extremity: Secondary | ICD-10-CM | POA: Diagnosis not present

## 2017-03-09 DIAGNOSIS — M5412 Radiculopathy, cervical region: Secondary | ICD-10-CM | POA: Diagnosis not present

## 2017-03-09 DIAGNOSIS — E1340 Other specified diabetes mellitus with diabetic neuropathy, unspecified: Secondary | ICD-10-CM | POA: Diagnosis not present

## 2017-03-29 ENCOUNTER — Other Ambulatory Visit: Payer: Self-pay | Admitting: Physician Assistant

## 2017-03-29 NOTE — Telephone Encounter (Signed)
Please Review

## 2017-03-30 DIAGNOSIS — I70209 Unspecified atherosclerosis of native arteries of extremities, unspecified extremity: Secondary | ICD-10-CM | POA: Diagnosis not present

## 2017-03-30 DIAGNOSIS — E1340 Other specified diabetes mellitus with diabetic neuropathy, unspecified: Secondary | ICD-10-CM | POA: Diagnosis not present

## 2017-03-30 DIAGNOSIS — M5412 Radiculopathy, cervical region: Secondary | ICD-10-CM | POA: Diagnosis not present

## 2017-04-02 DIAGNOSIS — Z23 Encounter for immunization: Secondary | ICD-10-CM | POA: Diagnosis not present

## 2017-04-05 ENCOUNTER — Other Ambulatory Visit: Payer: Self-pay | Admitting: Family Medicine

## 2017-05-04 ENCOUNTER — Ambulatory Visit (INDEPENDENT_AMBULATORY_CARE_PROVIDER_SITE_OTHER): Payer: 59 | Admitting: Family Medicine

## 2017-05-04 VITALS — BP 158/84 | HR 92 | Temp 97.9°F | Resp 16 | Wt 227.0 lb

## 2017-05-04 DIAGNOSIS — E7800 Pure hypercholesterolemia, unspecified: Secondary | ICD-10-CM | POA: Diagnosis not present

## 2017-05-04 DIAGNOSIS — M5416 Radiculopathy, lumbar region: Secondary | ICD-10-CM

## 2017-05-04 DIAGNOSIS — M5412 Radiculopathy, cervical region: Secondary | ICD-10-CM | POA: Diagnosis not present

## 2017-05-04 DIAGNOSIS — E118 Type 2 diabetes mellitus with unspecified complications: Secondary | ICD-10-CM

## 2017-05-04 DIAGNOSIS — I10 Essential (primary) hypertension: Secondary | ICD-10-CM | POA: Diagnosis not present

## 2017-05-04 NOTE — Progress Notes (Signed)
Jeff [name]  MRN: [id number] DOB: 10-28-1960  Subjective:  HPI   The patient is a 56 year old male who presents for follow up of chronic issues.  His last visit was on 11/18/16.    Diabetes- The patient's last A1C was on 09/17/16 and was 7.1.  He is due for diabetic foot and eye exam and A1C.  He checks his glucose on occasion and states that the readings have not been over 200 but does not know the exact numbers.  Hypertension- His last blood pressure readings in the office   BP Readings from Last 3 Encounters:  05/04/17 (!) 158/84  11/18/16 138/84  10/15/16 (!) 158/90   He is currently on Losartan, HCTZ, and Metoprolol.  Patient has not had his kidney function checked in more than a year.    Patient is on Atorvastatin and needs to have his cholesterol checked today.  Patient Active Problem List   Diagnosis Date Noted  . DDD (degenerative disc disease), cervical 02/25/2016  . H/O cervical spine surgery 02/25/2016  . Cervical facet syndrome 02/25/2016  . DDD (degenerative disc disease), lumbar 02/25/2016  . Facet syndrome, lumbar 02/25/2016  . Sacroiliac joint dysfunction 02/25/2016  . Atherosclerotic peripheral vascular disease ([place]) 02/25/2016  . Abnormal kidney function 12/06/2014  . Cervical nerve root disorder 12/06/2014  . Colon polyp 12/06/2014  . Clinical depression 12/06/2014  . Essential (primary) hypertension 12/06/2014  . Cephalalgia 12/06/2014  . Bulge of cervical disc without myelopathy 12/06/2014  . HLD (hyperlipidemia) 12/06/2014  . 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 Displacement of lumbar intervertebral disc without myelopathy 12/06/2014  . L-S radiculopathy 12/06/2014  . Lyme disease 12/06/2014  . Mild major depression ([place]) 12/06/2014  . Neuropathy 12/06/2014  . Adiposity 12/06/2014  . Peripheral vascular disease ([place]) 12/06/2014  . Diabetes mellitus, type 2 ([place]) 12/06/2014  . Cervical post-laminectomy syndrome 10/04/2013  . Chronic neck pain  10/04/2013  . Peripheral neuropathic pain 10/04/2013  . Failed back syndrome of thoracic spine 10/04/2013  . Fatigue 03/17/2013  . Hypokalemia 01/30/2013  . Positive D dimer 01/30/2013  . Chest pain 01/28/2013  . Seizures ([place]) 01/28/2013  . Syncope 01/28/2013  . Chronic pain associated with significant psychosocial dysfunction 08/17/2012  . Polypharmacy 08/02/2012  . 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 Post laminectomy syndrome 08/02/2012  . Thoracic spinal stenosis 06/22/2011    Past Medical History:  Diagnosis Date  . Cancer ([place])    SKIN  . Depression   . Diabetes mellitus without complication ([place])   . Fatigue   . Hyperlipidemia   . Hypertension   . Hypogonadism male   . Neuromuscular disorder ([place])    "back nerve stimulator"  . Neuropathy ([place])   . PAD (peripheral artery disease) ([place])   . Sleep apnea    CPAP  . Tinnitus   . Tuberculosis    POSITIVE  TB SKIN TEST 1992.6 MTH [place] .was exposed to someone who had it.    Social History   Social History  . Marital status: Widowed    Spouse name: N/A  . Number of children: 3  . Years of education: 12   Occupational History  . works at [employer]   . [place] History Main Topics  . Smoking status: Former Smoker    Packs/day: 1.50    Years: 30.00    Types: Cigarettes    Quit date: 2010  . Smokeless tobacco: Former Leisure [employer] manager  date: 12/16/1995  . Alcohol use No     Comment: Beer  . Drug use: No  . Sexual activity: Yes    Birth control/ protection: None   Other Topics Concern  . Not on file   Social History Narrative   Lives at home alone, widowed   Right-handed   Caffeine: tea and soft drinks       Outpatient Encounter Prescriptions as of 05/04/2017  Medication Sig  . aspirin 81 MG tablet Take 81 mg by mouth daily.  Marland Kitchen atorvastatin (LIPITOR) 10 MG tablet TAKE 1 TABLET BY MOUTH AT BEDTIME  . baclofen (LIORESAL) 10 MG tablet TAKE 1/2 TABLETS (5 MG TOTAL)  BY MOUTH 3 (THREE) TIMES DAILY.  . cyclobenzaprine (FLEXERIL) 10 MG tablet TAKE 1 TABLET (10 MG TOTAL) BY MOUTH 3 (THREE) TIMES DAILY AS NEEDED FOR MUSCLE SPASMS.  Marland Kitchen glucose blood (ONETOUCH VERIO) test strip Check sugar once daily DX E11.9  . hydrochlorothiazide (HYDRODIURIL) 25 MG tablet TAKE 1 TABLET BY MOUTH EVERY DAY. *INSURANCE ONLY COVERS 30 DAYS**  . losartan (COZAAR) 100 MG tablet TAKE 1 TABLET (100 MG TOTAL) BY MOUTH DAILY.  . metFORMIN (GLUCOPHAGE) 1000 MG tablet TAKE 1 TABLET BY MOUTH TWICE A DAY WITH A MEAL  . metoprolol succinate (TOPROL-XL) 50 MG 24 hr tablet Take 1 tablet (50 mg total) by mouth daily.  . Oxycodone HCl 10 MG TABS Take 1 tablet (10 mg total) by mouth every 4 (four) hours as needed. (Patient taking differently: Take 10 mg by mouth every 4 (four) hours as needed. )  . pregabalin (LYRICA) 150 MG capsule Take 1 capsule (150 mg total) by mouth 3 (three) times daily.  Marland Kitchen venlafaxine (EFFEXOR) 75 MG tablet Take 2 tablets (150 mg total) by mouth daily.  Marland Kitchen zolpidem (AMBIEN) 10 MG tablet TAKE 1 TABLET BY MOUTH AT BEDTIME AS NEEDED   No facility-administered encounter medications on file as of 05/04/2017.     Allergies  Allergen Reactions  . Cymbalta [Duloxetine Hcl] Other (See Comments)    "jittery" and "loopy" per pt  . Neurontin [Gabapentin] Other (See Comments)    caused severe tremor "jittery" and "loopy" per pt.  . Topamax  [Topiramate] Other (See Comments)    unresponsive  episodes  . Keppra [Levetiracetam] Other (See Comments) and Palpitations    "jittery", and "loopy." per pt.    Review of Systems  Constitutional: Positive for malaise/fatigue. Negative for fever.  Eyes: Negative.   Respiratory: Negative for cough, shortness of breath and wheezing.   Cardiovascular: Negative for chest pain, palpitations, orthopnea, claudication and leg swelling.  Gastrointestinal: Negative.   Genitourinary: Negative for frequency.  Musculoskeletal: Positive for back pain.    Skin: Negative.   Neurological: Negative for weakness.  Endo/Heme/Allergies: Negative for polydipsia.  Psychiatric/Behavioral: Negative.     Objective:  BP (!) 158/84 (BP Location: Right Arm, Patient Position: Sitting, Cuff Size: Normal)   Pulse 92   Temp 97.9 F (36.6 C) (Oral)   Resp 16   Wt 227 lb (103 kg)   BMI 32.57 kg/m   Physical Exam  Constitutional: He is oriented to person, place, and time and well-developed, well-nourished, and in no distress.  HENT:  Head: Normocephalic and atraumatic.  Eyes: Conjunctivae are normal. No scleral icterus.  Neck: No thyromegaly present.  Cardiovascular: Normal rate, regular rhythm and normal heart sounds.   Pulmonary/Chest: Effort normal and breath sounds normal.  Abdominal: Soft.  Neurological: He is alert and oriented to person, place, and  time.  Skin: Skin is warm and dry.  Psychiatric: Mood, memory, affect and judgment normal.    Assessment and Plan :  DDD/Chronic Pain Per Dr Devota Pace to Neurosurgery per Dr C. ASCVD TIIDM Check A1C with other labs.  I have done the exam and reviewed the chart and it is accurate to the best of my knowledge. Development worker, community has been used and  any errors in dictation or transcription are unintentional. Miguel Aschoff M.D. [facility]

## 2017-05-05 DIAGNOSIS — E1340 Other specified diabetes mellitus with diabetic neuropathy, unspecified: Secondary | ICD-10-CM | POA: Diagnosis not present

## 2017-05-05 DIAGNOSIS — M545 Low back pain: Secondary | ICD-10-CM | POA: Diagnosis not present

## 2017-05-05 DIAGNOSIS — M5136 Other intervertebral disc degeneration, lumbar region: Secondary | ICD-10-CM | POA: Diagnosis not present

## 2017-05-05 DIAGNOSIS — M5030 Other cervical disc degeneration, unspecified cervical region: Secondary | ICD-10-CM | POA: Diagnosis not present

## 2017-05-05 DIAGNOSIS — M5412 Radiculopathy, cervical region: Secondary | ICD-10-CM | POA: Diagnosis not present

## 2017-05-05 DIAGNOSIS — I70209 Unspecified atherosclerosis of native arteries of extremities, unspecified extremity: Secondary | ICD-10-CM | POA: Diagnosis not present

## 2017-05-06 ENCOUNTER — Telehealth: Payer: Self-pay

## 2017-05-06 LAB — CBC WITH DIFFERENTIAL/PLATELET
BASOS ABS: 73 {cells}/uL (ref 0–200)
Basophils Relative: 1.1 %
EOS ABS: 251 {cells}/uL (ref 15–500)
Eosinophils Relative: 3.8 %
HEMATOCRIT: 48.1 % (ref 38.5–50.0)
Hemoglobin: 16.6 g/dL (ref 13.2–17.1)
LYMPHS ABS: 2013 {cells}/uL (ref 850–3900)
MCH: 31.4 pg (ref 27.0–33.0)
MCHC: 34.5 g/dL (ref 32.0–36.0)
MCV: 91.1 fL (ref 80.0–100.0)
MPV: 9.3 fL (ref 7.5–12.5)
Monocytes Relative: 9.1 %
NEUTROS PCT: 55.5 %
Neutro Abs: 3663 cells/uL (ref 1500–7800)
Platelets: 251 10*3/uL (ref 140–400)
RBC: 5.28 10*6/uL (ref 4.20–5.80)
RDW: 12.9 % (ref 11.0–15.0)
TOTAL LYMPHOCYTE: 30.5 %
WBC: 6.6 10*3/uL (ref 3.8–10.8)
WBCMIX: 601 {cells}/uL (ref 200–950)

## 2017-05-06 LAB — COMPREHENSIVE METABOLIC PANEL
AG Ratio: 1.1 (calc) (ref 1.0–2.5)
ALBUMIN MSPROF: 3.8 g/dL (ref 3.6–5.1)
ALKALINE PHOSPHATASE (APISO): 103 U/L (ref 40–115)
ALT: 32 U/L (ref 9–46)
AST: 22 U/L (ref 10–35)
BILIRUBIN TOTAL: 0.9 mg/dL (ref 0.2–1.2)
BUN: 9 mg/dL (ref 7–25)
CALCIUM: 9.3 mg/dL (ref 8.6–10.3)
CHLORIDE: 98 mmol/L (ref 98–110)
CO2: 31 mmol/L (ref 20–32)
Creat: 0.91 mg/dL (ref 0.70–1.33)
Globulin: 3.6 g/dL (calc) (ref 1.9–3.7)
Glucose, Bld: 303 mg/dL — ABNORMAL HIGH (ref 65–99)
POTASSIUM: 4.7 mmol/L (ref 3.5–5.3)
Sodium: 134 mmol/L — ABNORMAL LOW (ref 135–146)
Total Protein: 7.4 g/dL (ref 6.1–8.1)

## 2017-05-06 LAB — LIPID PANEL
CHOLESTEROL: 121 mg/dL (ref <200)
HDL: 20 mg/dL — ABNORMAL LOW (ref >40)
LDL Cholesterol (Calc): 69 mg/dL (calc)
Non-HDL Cholesterol (Calc): 101 mg/dL (calc) (ref <130)
TRIGLYCERIDES: 220 mg/dL — AB (ref <150)
Total CHOL/HDL Ratio: 6.1 (calc) — ABNORMAL HIGH (ref <5.0)

## 2017-05-06 LAB — HEMOGLOBIN A1C
HEMOGLOBIN A1C: 12.6 %{Hb} — AB (ref <5.7)
Mean Plasma Glucose: 315 (calc)
eAG (mmol/L): 17.4 (calc)

## 2017-05-06 LAB — TSH: TSH: 1.14 m[IU]/L (ref 0.40–4.50)

## 2017-05-06 MED ORDER — EMPAGLIFLOZIN 25 MG PO TABS: 25.0000 mg | ORAL_TABLET | Freq: Every day | ORAL | 12 refills | Status: DC

## 2017-05-06 NOTE — Telephone Encounter (Signed)
-----   Message from Jerrol Banana., MD sent at 05/06/2017  1:48 PM EDT ----- Diabetes poorly controlled. If patient is able to tolerate it would start metformin 1000 mg every day

## 2017-05-20 DIAGNOSIS — M5442 Lumbago with sciatica, left side: Secondary | ICD-10-CM | POA: Diagnosis not present

## 2017-05-20 DIAGNOSIS — G8929 Other chronic pain: Secondary | ICD-10-CM | POA: Diagnosis not present

## 2017-05-20 DIAGNOSIS — M545 Low back pain: Secondary | ICD-10-CM | POA: Diagnosis not present

## 2017-05-20 DIAGNOSIS — M4322 Fusion of spine, cervical region: Secondary | ICD-10-CM | POA: Diagnosis not present

## 2017-05-20 DIAGNOSIS — M542 Cervicalgia: Secondary | ICD-10-CM | POA: Diagnosis not present

## 2017-05-20 DIAGNOSIS — M5412 Radiculopathy, cervical region: Secondary | ICD-10-CM | POA: Diagnosis not present

## 2017-05-28 ENCOUNTER — Other Ambulatory Visit: Payer: Self-pay | Admitting: Student

## 2017-05-28 ENCOUNTER — Other Ambulatory Visit: Payer: Self-pay | Admitting: Family Medicine

## 2017-05-28 DIAGNOSIS — M5442 Lumbago with sciatica, left side: Principal | ICD-10-CM

## 2017-05-28 DIAGNOSIS — M5412 Radiculopathy, cervical region: Secondary | ICD-10-CM

## 2017-05-28 DIAGNOSIS — G8929 Other chronic pain: Secondary | ICD-10-CM

## 2017-05-28 DIAGNOSIS — M5417 Radiculopathy, lumbosacral region: Secondary | ICD-10-CM

## 2017-05-28 DIAGNOSIS — M542 Cervicalgia: Secondary | ICD-10-CM

## 2017-05-31 DIAGNOSIS — Z1283 Encounter for screening for malignant neoplasm of skin: Secondary | ICD-10-CM | POA: Diagnosis not present

## 2017-05-31 DIAGNOSIS — D485 Neoplasm of uncertain behavior of skin: Secondary | ICD-10-CM | POA: Diagnosis not present

## 2017-05-31 DIAGNOSIS — L820 Inflamed seborrheic keratosis: Secondary | ICD-10-CM | POA: Diagnosis not present

## 2017-05-31 DIAGNOSIS — D225 Melanocytic nevi of trunk: Secondary | ICD-10-CM | POA: Diagnosis not present

## 2017-06-07 DIAGNOSIS — M5136 Other intervertebral disc degeneration, lumbar region: Secondary | ICD-10-CM | POA: Diagnosis not present

## 2017-06-07 DIAGNOSIS — M5030 Other cervical disc degeneration, unspecified cervical region: Secondary | ICD-10-CM | POA: Diagnosis not present

## 2017-06-07 DIAGNOSIS — M545 Low back pain: Secondary | ICD-10-CM | POA: Diagnosis not present

## 2017-06-25 ENCOUNTER — Ambulatory Visit
Admission: RE | Admit: 2017-06-25 | Discharge: 2017-06-25 | Disposition: A | Discharge status: Home / Self Care | Payer: 59 | Source: Ambulatory Visit | Attending: Student | Admitting: Student

## 2017-06-25 DIAGNOSIS — M5126 Other intervertebral disc displacement, lumbar region: Secondary | ICD-10-CM | POA: Diagnosis not present

## 2017-06-25 DIAGNOSIS — M5412 Radiculopathy, cervical region: Secondary | ICD-10-CM

## 2017-06-25 DIAGNOSIS — M1288 Other specific arthropathies, not elsewhere classified, other specified site: Secondary | ICD-10-CM | POA: Insufficient documentation

## 2017-06-25 DIAGNOSIS — G8929 Other chronic pain: Secondary | ICD-10-CM

## 2017-06-25 DIAGNOSIS — M5442 Lumbago with sciatica, left side: Secondary | ICD-10-CM | POA: Insufficient documentation

## 2017-06-25 DIAGNOSIS — M2578 Osteophyte, vertebrae: Secondary | ICD-10-CM | POA: Diagnosis not present

## 2017-06-25 DIAGNOSIS — Z981 Arthrodesis status: Secondary | ICD-10-CM | POA: Diagnosis not present

## 2017-06-25 DIAGNOSIS — M47816 Spondylosis without myelopathy or radiculopathy, lumbar region: Secondary | ICD-10-CM | POA: Diagnosis not present

## 2017-06-25 DIAGNOSIS — M542 Cervicalgia: Secondary | ICD-10-CM

## 2017-06-25 DIAGNOSIS — M5030 Other cervical disc degeneration, unspecified cervical region: Secondary | ICD-10-CM | POA: Diagnosis not present

## 2017-06-25 DIAGNOSIS — M48061 Spinal stenosis, lumbar region without neurogenic claudication: Secondary | ICD-10-CM | POA: Diagnosis not present

## 2017-06-25 DIAGNOSIS — M4722 Other spondylosis with radiculopathy, cervical region: Secondary | ICD-10-CM | POA: Insufficient documentation

## 2017-06-25 LAB — GLUCOSE, CAPILLARY: Glucose-Capillary: 235 mg/dL — ABNORMAL HIGH (ref 65–99)

## 2017-06-25 MED ORDER — OXYCODONE HCL 5 MG PO TABS
ORAL_TABLET | ORAL | Status: AC
  Filled 2017-06-25: qty 2

## 2017-06-25 MED ORDER — OXYCODONE HCL 5 MG PO TABS
10.0000 mg | ORAL_TABLET | Freq: Once | ORAL | Status: AC
Start: 2017-06-25 — End: 2017-06-25
  Administered 2017-06-25: 10 mg via ORAL

## 2017-06-25 MED ORDER — IOPAMIDOL (ISOVUE-M 300) INJECTION 61%
10.0000 mL | Freq: Once | INTRAMUSCULAR | Status: AC | PRN
  Administered 2017-06-25: 10 mL via INTRA_ARTICULAR

## 2017-06-25 MED ORDER — LIDOCAINE HCL (PF) 1 % IJ SOLN: 5.0000 mL | Freq: Once | INTRAMUSCULAR | Status: DC

## 2017-07-02 DIAGNOSIS — M5136 Other intervertebral disc degeneration, lumbar region: Secondary | ICD-10-CM | POA: Diagnosis not present

## 2017-07-02 DIAGNOSIS — M545 Low back pain: Secondary | ICD-10-CM | POA: Diagnosis not present

## 2017-07-02 DIAGNOSIS — M5030 Other cervical disc degeneration, unspecified cervical region: Secondary | ICD-10-CM | POA: Diagnosis not present

## 2017-07-15 DIAGNOSIS — M5417 Radiculopathy, lumbosacral region: Secondary | ICD-10-CM | POA: Diagnosis not present

## 2017-07-30 DIAGNOSIS — M5030 Other cervical disc degeneration, unspecified cervical region: Secondary | ICD-10-CM | POA: Diagnosis not present

## 2017-07-30 DIAGNOSIS — M5136 Other intervertebral disc degeneration, lumbar region: Secondary | ICD-10-CM | POA: Diagnosis not present

## 2017-07-30 DIAGNOSIS — M545 Low back pain: Secondary | ICD-10-CM | POA: Diagnosis not present

## 2017-08-11 ENCOUNTER — Other Ambulatory Visit: Payer: Self-pay

## 2017-08-11 ENCOUNTER — Ambulatory Visit (INDEPENDENT_AMBULATORY_CARE_PROVIDER_SITE_OTHER): Payer: 59 | Admitting: Family Medicine

## 2017-08-11 ENCOUNTER — Encounter: Payer: Self-pay | Admitting: Family Medicine

## 2017-08-11 VITALS — BP 150/80 | HR 72 | Temp 97.6°F | Resp 16 | Wt 212.0 lb

## 2017-08-11 DIAGNOSIS — E118 Type 2 diabetes mellitus with unspecified complications: Secondary | ICD-10-CM

## 2017-08-11 DIAGNOSIS — Z125 Encounter for screening for malignant neoplasm of prostate: Secondary | ICD-10-CM

## 2017-08-11 DIAGNOSIS — Z0000 Encounter for general adult medical examination without abnormal findings: Secondary | ICD-10-CM | POA: Diagnosis not present

## 2017-08-11 DIAGNOSIS — I70209 Unspecified atherosclerosis of native arteries of extremities, unspecified extremity: Secondary | ICD-10-CM

## 2017-08-11 DIAGNOSIS — I10 Essential (primary) hypertension: Secondary | ICD-10-CM

## 2017-08-11 DIAGNOSIS — B351 Tinea unguium: Secondary | ICD-10-CM | POA: Diagnosis not present

## 2017-08-11 NOTE — Progress Notes (Signed)
Patient: Jeff [name], Male    DOB: 1960-09-03, 57 y.o.   MRN: [id number] Visit Date: 08/11/2017  Today's Provider: [name], MD   Chief Complaint  Patient presents with  . Annual Exam   Subjective:  Jeff [name] is a 57 y.o. male who presents today for health maintenance and complete physical. He feels fairly well. He reports exercising none. He reports he is sleeping fairly well. His father died over Christmas at age 50.  Immunization History  Administered Date(s) Administered  . Influenza,inj,Quad PF,6+ Mos 05/14/2015  . Influenza-Unspecified 04/16/2016  . Pneumococcal Polysaccharide-23 01/21/2012  . Tdap 01/21/2012   04/20/13 Colonoscopy, Dr. Ulla Potash adenoma  Patient had routine labs done in October including A1C but not PSA  Review of Systems  Constitutional: Negative.   HENT: Positive for tinnitus.   Eyes: Negative.   Respiratory: Negative.   Cardiovascular: Negative.   Gastrointestinal: Negative.   Endocrine: Positive for polydipsia.  Genitourinary: Negative.   Musculoskeletal: Positive for arthralgias, back pain and neck pain.  Skin: Negative.   Allergic/Immunologic: Negative.   Neurological: Positive for headaches.  Hematological: Negative.   Psychiatric/Behavioral: Negative.     Social History   Socioeconomic History  . Marital status: Widowed    Spouse name: Not on file  . Number of children: 3  . Years of education: 71  . Highest education level: Not on file  Social Needs  . Financial resource strain: Not on file  . Food insecurity - worry: Not on file  . Food insecurity - inability: Not on file  . Transportation needs - medical: Not on file  . Transportation needs - non-medical: Not on file  Occupational History  . Occupation: works at [employer]  . Occupation: Software engineer: [employer]  Tobacco Use  . Smoking status: Former Smoker    Packs/day: 1.50    Years: 30.00    Pack years: 45.00     Types: Cigarettes    Last attempt to quit: 2010    Years since quitting: 9.0  . Smokeless tobacco: Former Systems developer    Quit date: 12/16/1995  Substance and Sexual Activity  . Alcohol use: No    Alcohol/week: 0.6 oz    Types: 1 Standard drinks or equivalent per week    Comment: Beer  . Drug use: No  . Sexual activity: Yes    Birth control/protection: None  Other Topics Concern  . Not on file  Social History Narrative   Lives at home alone, widowed   Right-handed   Caffeine: tea and soft drinks    Patient Active Problem List   Diagnosis Date Noted  . DDD (degenerative disc disease), cervical 02/25/2016  . H/O cervical spine surgery 02/25/2016  . Cervical facet syndrome 02/25/2016  . DDD (degenerative disc disease), lumbar 02/25/2016  . Facet syndrome, lumbar 02/25/2016  . Sacroiliac joint dysfunction 02/25/2016  . Atherosclerotic peripheral vascular disease ([place]) 02/25/2016  . Abnormal kidney function 12/06/2014  . Cervical nerve root disorder 12/06/2014  . Colon polyp 12/06/2014  . Clinical depression 12/06/2014  . Essential (primary) hypertension 12/06/2014  . Cephalalgia 12/06/2014  . Bulge of cervical disc without myelopathy 12/06/2014  . HLD (hyperlipidemia) 12/06/2014  . 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 Displacement of lumbar intervertebral disc without myelopathy 12/06/2014  . L-S radiculopathy 12/06/2014  . Lyme disease 12/06/2014  . Mild major depression ([place]) 12/06/2014  . Neuropathy 12/06/2014  . Adiposity 12/06/2014  . Peripheral vascular disease ([place]) 12/06/2014  .  Diabetes mellitus, type 2 ([place]) 12/06/2014  . Cervical post-laminectomy syndrome 10/04/2013  . Chronic neck pain 10/04/2013  . Peripheral neuropathic pain 10/04/2013  . Failed back syndrome of thoracic spine 10/04/2013  . Fatigue 03/17/2013  . Hypokalemia 01/30/2013  . Positive D dimer 01/30/2013  . Chest pain 01/28/2013  . Seizures ([place]) 01/28/2013  . Syncope 01/28/2013  . Chronic pain associated  with significant psychosocial dysfunction 08/17/2012  . Polypharmacy 08/02/2012  . 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 Post laminectomy syndrome 08/02/2012  . Thoracic spinal stenosis 06/22/2011    Past Surgical History:  Procedure Laterality Date  . BACK SURGERY  2012   neck was 2012,back same year. plate in [id number]  . CARDIAC CATHETERIZATION  01/31/2013   Medical management  . CARPAL TUNNEL RELEASE    . CATARACT EXTRACTION Right 2014  . CATARACT EXTRACTION W/PHACO Left 03/12/2016   Procedure: CATARACT EXTRACTION PHACO AND INTRAOCULAR LENS PLACEMENT (IOC);  Surgeon: Birder Robson, MD;  Location: [facility] ORS;  Service: Ophthalmology;  Laterality: Left;  [place] 00:31AP% 17.9CDE 5.67Fluid pack lot # [id number]  . CERVICAL FUSION  2012  . CORONARY ANGIOPLASTY  2014   all good  . KNEE ARTHROSCOPY    . LEFT HEART CATHETERIZATION WITH CORONARY ANGIOGRAM N/A 01/31/2013   Procedure: LEFT HEART CATHETERIZATION WITH CORONARY ANGIOGRAM;  Surgeon: Minus Breeding, MD;  Location: [facility] CATH LAB;  Service: Cardiovascular;  Laterality: N/A;  . LUMBAR LAMINECTOMY/DECOMPRESSION MICRODISCECTOMY  06/22/2011   Procedure: LUMBAR LAMINECTOMY/DECOMPRESSION MICRODISCECTOMY;  Surgeon: Ophelia Charter;  Location: [place] [facility]UR[facility] ORS;  Service: Neurosurgery;  Laterality: N/A;  Thoracic Ten-Eleven,Thoracic Eleven-Twelve Laminectomy  . VASECTOMY  1991    His family history includes Atrial fibrillation in his mother; Cancer in his maternal grandmother and maternal uncle; Coronary artery disease in his father; Dementia in his mother; Diabetes in his father; Heart disease in his father; Hyperlipidemia in his father; Hypertension in his father and mother; Migraines in his daughter; Transient ischemic attack in his mother.     Outpatient Encounter Medications as of 08/11/2017  Medication Sig Note  . ARIPiprazole (ABILIFY) 5 MG tablet Take 5 mg by mouth daily.   Marland Kitchen aspirin 81 MG tablet Take 81 mg by mouth daily.   Marland Kitchen atorvastatin  (LIPITOR) 10 MG tablet TAKE 1 TABLET BY MOUTH AT BEDTIME   . empagliflozin (JARDIANCE) 25 MG TABS tablet Take 25 mg by mouth daily.   Marland Kitchen glucose blood (ONETOUCH VERIO) test strip Check sugar once daily DX E11.9   . hydrochlorothiazide (HYDRODIURIL) 25 MG tablet TAKE 1 TABLET BY MOUTH EVERY DAY. *INSURANCE ONLY COVERS 30 DAYS**   . losartan (COZAAR) 100 MG tablet TAKE 1 TABLET (100 MG TOTAL) BY MOUTH DAILY.   . metFORMIN (GLUCOPHAGE) 1000 MG tablet TAKE 1 TABLET BY MOUTH TWICE A DAY WITH A MEAL   . metoprolol succinate (TOPROL-XL) 50 MG 24 hr tablet Take 1 tablet (50 mg total) by mouth daily.   . orphenadrine (NORFLEX) 100 MG tablet Take 100 mg by mouth 2 (two) times daily.   . Oxycodone HCl 10 MG TABS Take 1 tablet (10 mg total) by mouth every 4 (four) hours as needed. (Patient taking differently: Take 10 mg by mouth every 4 (four) hours as needed. )   . pregabalin (LYRICA) 150 MG capsule Take 1 capsule (150 mg total) by mouth 3 (three) times daily.   Marland Kitchen venlafaxine (EFFEXOR) 75 MG tablet Take 2 tablets (150 mg total) by mouth daily.   Marland Kitchen zolpidem (AMBIEN) 10 MG tablet  TAKE 1 TABLET BY MOUTH AT BEDTIME AS NEEDED   . cyclobenzaprine (FLEXERIL) 10 MG tablet TAKE 1 TABLET (10 MG TOTAL) BY MOUTH 3 (THREE) TIMES DAILY AS NEEDED FOR MUSCLE SPASMS. (Patient not taking: Reported on 08/11/2017) 08/11/2017: Not currently taking, Dr Primus Bravo has him holding off on this until he decides if he is going to keep him on the Norflex.  . [DISCONTINUED] baclofen (LIORESAL) 10 MG tablet TAKE 1/2 TABLETS (5 MG TOTAL) BY MOUTH 3 (THREE) TIMES DAILY.    No facility-administered encounter medications on file as of 08/11/2017.     Patient Care Team: Jerrol Banana., MD as PCP - General (Family Medicine) Newman Pies, MD as Consulting Physician (Neurosurgery) Mohammed Kindle, MD as Attending Physician (Pain Medicine)      Objective:   Vitals:  Vitals:   08/11/17 0918  BP: (!) 150/80  Pulse: 72  Resp: 16  Temp:  97.6 F (36.4 C)  TempSrc: Oral  Weight: 212 lb (96.2 kg)    Physical Exam  Constitutional: He is oriented to person, place, and time. He appears well-developed and well-nourished.  HENT:  Head: Normocephalic and atraumatic.  Right Ear: External ear normal.  Left Ear: External ear normal.  Nose: Nose normal.  Mouth/Throat: Oropharynx is clear and moist.  Eyes: Conjunctivae and EOM are normal. Pupils are equal, round, and reactive to light.  Neck: Normal range of motion. Neck supple.  Cardiovascular: Normal rate, regular rhythm, normal heart sounds and intact distal pulses.  Pulmonary/Chest: Effort normal and breath sounds normal.  Abdominal: Soft. Bowel sounds are normal.  Genitourinary: Rectum normal, prostate normal and penis normal.  Musculoskeletal: Normal range of motion.  Neurological: He is alert and oriented to person, place, and time.  Mild decreased sensation left foot.  Skin: Skin is warm and dry.  Large oval birthmark left upper abdomen.  Psychiatric: He has a normal mood and affect. His behavior is normal. Judgment and thought content normal.   Fall Risk  08/11/2017 03/24/2016 02/25/2016 02/18/2016 01/08/2016  Falls in the past year? Yes No Yes No No  Comment - - patient states that he is all the time falling, patient states that his legs give out  - -  Number falls in past yr: 2 or more - - - -  Injury with Fall? No - No - -  Comment - - reports no injury during any of the falls other than bruises.  - -   Depression Screen PHQ 2/9 Scores 08/11/2017 09/17/2016 03/24/2016 02/18/2016  PHQ - 2 Score 2 5 0 4  PHQ- 9 Score 7 14 - 15   Functional Status Survey: Is the patient deaf or have difficulty hearing?: Yes Does the patient have difficulty seeing, even when wearing glasses/contacts?: No Does the patient have difficulty concentrating, remembering, or making decisions?: No Does the patient have difficulty walking or climbing stairs?: Yes Does the patient have difficulty  dressing or bathing?: No Does the patient have difficulty doing errands alone such as visiting a doctor's office or shopping?: No    Assessment & Plan:     Routine Health Maintenance and Physical Exam  Exercise Activities and Dietary recommendations Goals    None      Immunization History  Administered Date(s) Administered  . Influenza,inj,Quad PF,6+ Mos 05/14/2015  . Influenza-Unspecified 04/16/2016  . Pneumococcal Polysaccharide-23 01/21/2012  . Tdap 01/21/2012    Health Maintenance  Topic Date Due  . FOOT EXAM  01/16/1969  . OPHTHALMOLOGY EXAM  01/16/1969  .  HIV Screening  01/16/1974  . PNEUMOCOCCAL POLYSACCHARIDE VACCINE (2) 01/20/2017  . INFLUENZA VACCINE  03/03/2017  . HEMOGLOBIN A1C  11/03/2017  . TETANUS/TDAP  01/20/2022  . COLONOSCOPY  04/21/2023  . Hepatitis C Screening  Completed     Discussed health benefits of physical activity, and encouraged him to engage in regular exercise appropriate for his age and condition.  HTN TIIDM HLD onychomycosis   I have done the exam and reviewed the chart and it is accurate to the best of my knowledge. Development worker, community has been used and  any errors in dictation or transcription are unintentional. Miguel Aschoff M.D. [facility]

## 2017-08-12 LAB — PSA: Prostate Specific Ag, Serum: 1 ng/mL (ref 0.0–4.0)

## 2017-08-12 LAB — HEMOGLOBIN A1C
ESTIMATED AVERAGE GLUCOSE: 180 mg/dL
Hgb A1c MFr Bld: 7.9 % — ABNORMAL HIGH (ref 4.8–5.6)

## 2017-08-16 ENCOUNTER — Telehealth: Payer: Self-pay

## 2017-08-16 DIAGNOSIS — M5030 Other cervical disc degeneration, unspecified cervical region: Secondary | ICD-10-CM | POA: Diagnosis not present

## 2017-08-16 DIAGNOSIS — M545 Low back pain: Secondary | ICD-10-CM | POA: Diagnosis not present

## 2017-08-16 DIAGNOSIS — M5136 Other intervertebral disc degeneration, lumbar region: Secondary | ICD-10-CM | POA: Diagnosis not present

## 2017-08-16 NOTE — Telephone Encounter (Signed)
-----   Message from Jerrol Banana., MD sent at 08/16/2017  2:02 PM EST ----- PSA good.  A1c/diabetes much better

## 2017-08-16 NOTE — Telephone Encounter (Signed)
Patient advised as directed below.Per patient his appointment with Podiatry is 09/09/17.  Thanks,  -Joseline

## 2017-08-23 DIAGNOSIS — M5136 Other intervertebral disc degeneration, lumbar region: Secondary | ICD-10-CM | POA: Diagnosis not present

## 2017-08-23 DIAGNOSIS — M545 Low back pain: Secondary | ICD-10-CM | POA: Diagnosis not present

## 2017-08-23 DIAGNOSIS — M5030 Other cervical disc degeneration, unspecified cervical region: Secondary | ICD-10-CM | POA: Diagnosis not present

## 2017-08-27 ENCOUNTER — Other Ambulatory Visit: Payer: Self-pay | Admitting: Family Medicine

## 2017-08-27 DIAGNOSIS — M5416 Radiculopathy, lumbar region: Secondary | ICD-10-CM | POA: Diagnosis not present

## 2017-08-27 DIAGNOSIS — R0602 Shortness of breath: Secondary | ICD-10-CM

## 2017-09-08 DIAGNOSIS — M5416 Radiculopathy, lumbar region: Secondary | ICD-10-CM | POA: Diagnosis not present

## 2017-09-09 ENCOUNTER — Ambulatory Visit: Payer: 59 | Admitting: Podiatry

## 2017-09-09 ENCOUNTER — Encounter: Payer: Self-pay | Admitting: Podiatry

## 2017-09-09 VITALS — BP 146/83 | HR 66

## 2017-09-09 DIAGNOSIS — L97509 Non-pressure chronic ulcer of other part of unspecified foot with unspecified severity: Secondary | ICD-10-CM

## 2017-09-09 DIAGNOSIS — E1142 Type 2 diabetes mellitus with diabetic polyneuropathy: Secondary | ICD-10-CM | POA: Diagnosis not present

## 2017-09-09 DIAGNOSIS — E11621 Type 2 diabetes mellitus with foot ulcer: Secondary | ICD-10-CM

## 2017-09-09 DIAGNOSIS — L603 Nail dystrophy: Secondary | ICD-10-CM

## 2017-09-09 MED ORDER — SILVER SULFADIAZINE 1 % EX CREA: 1.0000 "application " | TOPICAL_CREAM | Freq: Every day | CUTANEOUS | 0 refills | Status: DC

## 2017-09-09 NOTE — Progress Notes (Addendum)
   Subjective:    Patient ID: Jeff [name], male    DOB: Dec 07, 1960, 57 y.o.   MRN: [id number]  HPIthis patient presents the office for an evaluation of both of his feet.  He says he's had history of losing his toenails over  the last 4-5 months.  His medical doctor was concerned about this and recommended he be  seen by a podiatrist.  Examination of his toes revealed the absence of nail plates on the first, second,  fifth digits, left foot and first digit right foot.  No evidence of any drainage or infection noted. Patient states he is not experiencing any pain or discomfort at these 4 sites.  He presents the office today for an evaluation of these 4 toes.    Review of Systems  All other systems reviewed and are negative.      Objective:   Physical Exam General Appearance  Alert, conversant and in no acute stress.  Vascular  Dorsalis pedis and posterior pulses are palpable  bilaterally.  Capillary return is within normal limits  bilaterally. Temperature is within normal limits  Bilaterally.  Neurologic  Jeff [name] monofilament wire test within normal limits  bilaterally. Muscle power within normal limits bilaterally.  Nails self avulsion has occurred on the first, second and fifth toenails of the left foot.   There is also self avulsion on the right big toe.  Mild desquamation noted around the first and second digits of the left foot.  Orthopedic  No limitations of motion of motion feet bilaterally.  No crepitus or effusions noted.  No bony pathology or digital deformities noted.  Skin  Subungual ulcers noted to the nailbed of the first and second digit of the left foot.  Healing is noted to the fifth digit nailbed and the first digit right foot nailbed.        Assessment & Plan:  Nail Dystrophy  Ulcer subungually 1,2 left.  IE  Grinding was performed on the four  Digits that had  self avulsed nail.  No evidence of any pus drainage or infection.  There are 2 subungual ulcers  noted on the first and second digit of the left foot.  These for toes appear to be healing  rather slowly for this patient.  .Silvadene dry sterile dressing were applied to the first and second digits of the left foot.  Silvadene was prescribed for this patient and he was given home instructions for usage.  RTC prn. Discussed this condition with this patient and noted. He has normal vascularity and normal LOPS.  Therefore, I told this patient to give it more time to heal.  Peroxide daily.  Gardiner Barefoot DPM

## 2017-09-13 DIAGNOSIS — M545 Low back pain: Secondary | ICD-10-CM | POA: Diagnosis not present

## 2017-09-13 DIAGNOSIS — M5030 Other cervical disc degeneration, unspecified cervical region: Secondary | ICD-10-CM | POA: Diagnosis not present

## 2017-09-13 DIAGNOSIS — M5136 Other intervertebral disc degeneration, lumbar region: Secondary | ICD-10-CM | POA: Diagnosis not present

## 2017-09-23 DIAGNOSIS — G8929 Other chronic pain: Secondary | ICD-10-CM | POA: Diagnosis not present

## 2017-09-23 DIAGNOSIS — M5442 Lumbago with sciatica, left side: Secondary | ICD-10-CM | POA: Diagnosis not present

## 2017-09-23 DIAGNOSIS — M542 Cervicalgia: Secondary | ICD-10-CM | POA: Diagnosis not present

## 2017-09-24 ENCOUNTER — Other Ambulatory Visit: Payer: Self-pay | Admitting: Family Medicine

## 2017-09-24 DIAGNOSIS — F320 Major depressive disorder, single episode, mild: Secondary | ICD-10-CM

## 2017-09-29 DIAGNOSIS — M47816 Spondylosis without myelopathy or radiculopathy, lumbar region: Secondary | ICD-10-CM | POA: Diagnosis not present

## 2017-10-01 ENCOUNTER — Other Ambulatory Visit: Payer: Self-pay | Admitting: Family Medicine

## 2017-10-01 DIAGNOSIS — I10 Essential (primary) hypertension: Secondary | ICD-10-CM

## 2017-10-05 NOTE — Telephone Encounter (Signed)
Faxed Rx for zolpidem (AMBIEN) 10 MG tablet to [facility] [address]. Thanks TNP

## 2017-10-11 DIAGNOSIS — M5136 Other intervertebral disc degeneration, lumbar region: Secondary | ICD-10-CM | POA: Diagnosis not present

## 2017-10-11 DIAGNOSIS — M545 Low back pain: Secondary | ICD-10-CM | POA: Diagnosis not present

## 2017-10-11 DIAGNOSIS — M5030 Other cervical disc degeneration, unspecified cervical region: Secondary | ICD-10-CM | POA: Diagnosis not present

## 2017-10-19 DIAGNOSIS — M5416 Radiculopathy, lumbar region: Secondary | ICD-10-CM | POA: Diagnosis not present

## 2017-11-08 DIAGNOSIS — M5136 Other intervertebral disc degeneration, lumbar region: Secondary | ICD-10-CM | POA: Diagnosis not present

## 2017-11-08 DIAGNOSIS — M545 Low back pain: Secondary | ICD-10-CM | POA: Diagnosis not present

## 2017-11-08 DIAGNOSIS — M5030 Other cervical disc degeneration, unspecified cervical region: Secondary | ICD-10-CM | POA: Diagnosis not present

## 2017-11-09 DIAGNOSIS — M5416 Radiculopathy, lumbar region: Secondary | ICD-10-CM | POA: Diagnosis not present

## 2017-11-21 ENCOUNTER — Other Ambulatory Visit: Payer: Self-pay | Admitting: Family Medicine

## 2017-11-29 DIAGNOSIS — D485 Neoplasm of uncertain behavior of skin: Secondary | ICD-10-CM | POA: Diagnosis not present

## 2017-11-29 DIAGNOSIS — Z85828 Personal history of other malignant neoplasm of skin: Secondary | ICD-10-CM | POA: Diagnosis not present

## 2017-11-29 DIAGNOSIS — L570 Actinic keratosis: Secondary | ICD-10-CM | POA: Diagnosis not present

## 2017-11-29 DIAGNOSIS — D225 Melanocytic nevi of trunk: Secondary | ICD-10-CM | POA: Diagnosis not present

## 2017-11-30 DIAGNOSIS — M47812 Spondylosis without myelopathy or radiculopathy, cervical region: Secondary | ICD-10-CM | POA: Diagnosis not present

## 2017-12-06 DIAGNOSIS — M5030 Other cervical disc degeneration, unspecified cervical region: Secondary | ICD-10-CM | POA: Diagnosis not present

## 2017-12-06 DIAGNOSIS — M545 Low back pain: Secondary | ICD-10-CM | POA: Diagnosis not present

## 2017-12-06 DIAGNOSIS — M5136 Other intervertebral disc degeneration, lumbar region: Secondary | ICD-10-CM | POA: Diagnosis not present

## 2017-12-07 ENCOUNTER — Ambulatory Visit: Payer: 59 | Admitting: Family Medicine

## 2017-12-07 ENCOUNTER — Encounter: Payer: Self-pay | Admitting: Family Medicine

## 2017-12-07 VITALS — BP 118/78 | HR 60 | Temp 97.8°F | Resp 16 | Wt 218.0 lb

## 2017-12-07 DIAGNOSIS — M961 Postlaminectomy syndrome, not elsewhere classified: Secondary | ICD-10-CM | POA: Diagnosis not present

## 2017-12-07 DIAGNOSIS — F320 Major depressive disorder, single episode, mild: Secondary | ICD-10-CM | POA: Diagnosis not present

## 2017-12-07 DIAGNOSIS — E118 Type 2 diabetes mellitus with unspecified complications: Secondary | ICD-10-CM

## 2017-12-07 DIAGNOSIS — I10 Essential (primary) hypertension: Secondary | ICD-10-CM

## 2017-12-07 DIAGNOSIS — I70209 Unspecified atherosclerosis of native arteries of extremities, unspecified extremity: Secondary | ICD-10-CM | POA: Diagnosis not present

## 2017-12-07 LAB — POCT GLYCOSYLATED HEMOGLOBIN (HGB A1C): Hemoglobin A1C: 6.3

## 2017-12-07 NOTE — Progress Notes (Signed)
Patient: Jeff [name] Male    DOB: 1961-03-19   57 y.o.   MRN: [id number] Visit Date: 12/07/2017  Today's Provider: [name], MD   Chief Complaint  Patient presents with  . Hypertension  . Diabetes   Subjective:    HPI    Diabetes Mellitus Type II, Follow-up:   Lab Results  Component Value Date   HGBA1C 7.9 (H) 08/11/2017   HGBA1C 12.6 (H) 05/05/2017   HGBA1C 7.1 09/17/2016    Last seen for diabetes 4 months ago.  Management since then includes none. He reports good compliance with treatment. He is having side effects.  Home blood sugar records: low 200's Episodes of hypoglycemia? no   Most Recent Eye Exam: 04/2017 - [facility] Current exercise: none  Pertinent Labs:    Component Value Date/Time   CHOL 121 05/05/2017 0800   CHOL CANCELED 02/19/2016 0813   TRIG 220 (H) 05/05/2017 0800   HDL 20 (L) 05/05/2017 0800   HDL CANCELED 02/19/2016 0813   LDLCALC 69 05/05/2017 0800   CREATININE 0.91 05/05/2017 0800    Wt Readings from Last 3 Encounters:  12/07/17 218 lb (98.9 kg)  08/11/17 212 lb (96.2 kg)  06/25/17 227 lb (103 kg)   ------------------------------------------------------------------------   Hypertension, follow-up:  BP Readings from Last 3 Encounters:  12/07/17 118/78  09/09/17 (!) 146/83  08/11/17 (!) 150/80    He was last seen for hypertension 4 months ago.  BP at that visit was 146/83 Management since that visit includes none. He reports good compliance with treatment. He is having side effects. He is not exercising. He is not adherent to low salt diet.   Outside blood pressures are running about the same they are in the office today 118/78. He is experiencing fatigue.  Patient denies chest pain, chest pressure/discomfort, claudication, dyspnea, exertional chest pressure/discomfort, irregular heart beat, lower extremity edema, near-syncope, orthopnea, palpitations, paroxysmal nocturnal dyspnea and syncope.     Wt Readings from Last 3 Encounters:  12/07/17 218 lb (98.9 kg)  08/11/17 212 lb (96.2 kg)  06/25/17 227 lb (103 kg)   ------------------------------------------------------------------------     Allergies  Allergen Reactions  . Cymbalta [Duloxetine Hcl] Other (See Comments)    "jittery" and "loopy" per pt  . Neurontin [Gabapentin] Other (See Comments)    caused severe tremor "jittery" and "loopy" per pt.  . Topamax  [Topiramate] Other (See Comments)    unresponsive  episodes  . Keppra [Levetiracetam] Other (See Comments) and Palpitations    "jittery", and "loopy." per pt.     Current Outpatient Medications:  .  ARIPiprazole (ABILIFY) 5 MG tablet, TAKE 1 TABLET (5 MG TOTAL) BY MOUTH DAILY., Disp: 30 tablet, Rfl: 11 .  aspirin 81 MG tablet, Take 81 mg by mouth daily., Disp: , Rfl:  .  atorvastatin (LIPITOR) 10 MG tablet, TAKE 1 TABLET BY MOUTH AT BEDTIME, Disp: 90 tablet, Rfl: 3 .  empagliflozin (JARDIANCE) 25 MG TABS tablet, Take 25 mg by mouth daily., Disp: 30 tablet, Rfl: 12 .  glucose blood (ONETOUCH VERIO) test strip, Check sugar once daily DX E11.9, Disp: 100 each, Rfl: 3 .  hydrochlorothiazide (HYDRODIURIL) 25 MG tablet, TAKE 1 TABLET BY MOUTH EVERY DAY. *INSURANCE ONLY COVERS 30 DAYS**, Disp: 30 tablet, Rfl: 12 .  LYRICA 100 MG capsule, LIMIT 1 CAPSULE BY MOUTH 3 - 5 TIMES PER DAY IF TOLERATED (NOTE THAT CAPSULE IS NOW 100 MG SIZE), Disp: , Rfl: 0 .  metFORMIN (GLUCOPHAGE) 1000 MG tablet, TAKE 1 TABLET BY MOUTH TWICE A DAY WITH A MEAL, Disp: 60 tablet, Rfl: 11 .  metoprolol succinate (TOPROL-XL) 50 MG 24 hr tablet, TAKE 1 TABLET (50 MG TOTAL) BY MOUTH DAILY., Disp: 30 tablet, Rfl: 11 .  orphenadrine (NORFLEX) 100 MG tablet, Take 100 mg by mouth 2 (two) times daily., Disp: , Rfl:  .  Oxycodone HCl 10 MG TABS, Take 1 tablet (10 mg total) by mouth every 4 (four) hours as needed. (Patient taking differently: Take 10 mg by mouth every 4 (four) hours as needed. ), Disp: 100  tablet, Rfl: 0 .  pregabalin (LYRICA) 150 MG capsule, Take 1 capsule (150 mg total) by mouth 3 (three) times daily. (Patient taking differently: Take 100 mg by mouth 5 (five) times daily. ), Disp: 180 capsule, Rfl: 5 .  silver sulfADIAZINE (SILVADENE) 1 % cream, Apply 1 application topically daily., Disp: 50 g, Rfl: 0 .  venlafaxine (EFFEXOR) 75 MG tablet, TAKE 3 TABLETS (225 MG TOTAL) BY MOUTH DAILY., Disp: 90 tablet, Rfl: 11 .  zolpidem (AMBIEN) 10 MG tablet, TAKE 1 TABLET BY MOUTH AT BEDTIME AS NEEDED, Disp: 30 tablet, Rfl: 5 .  cyclobenzaprine (FLEXERIL) 10 MG tablet, TAKE 1 TABLET (10 MG TOTAL) BY MOUTH 3 (THREE) TIMES DAILY AS NEEDED FOR MUSCLE SPASMS. (Patient not taking: Reported on 08/11/2017), Disp: 90 tablet, Rfl: 5 .  losartan (COZAAR) 100 MG tablet, TAKE 1 TABLET (100 MG TOTAL) BY MOUTH DAILY., Disp: 90 tablet, Rfl: 3 .  venlafaxine (EFFEXOR) 75 MG tablet, Take 2 tablets (150 mg total) by mouth daily. (Patient not taking: Reported on 09/09/2017), Disp: 90 tablet, Rfl: 11  Review of Systems  Constitutional: Positive for fatigue.  HENT: Negative.   Eyes: Negative.   Respiratory: Negative.   Cardiovascular: Negative.   Gastrointestinal: Negative.   Endocrine: Negative.   Genitourinary: Negative.   Musculoskeletal: Positive for arthralgias, back pain, neck pain and neck stiffness.  Skin: Negative.   Allergic/Immunologic: Negative.   Neurological: Negative.   Hematological: Negative.   Psychiatric/Behavioral: Negative.     Social History   Tobacco Use  . Smoking status: Former Smoker    Packs/day: 1.50    Years: 30.00    Pack years: 45.00    Types: Cigarettes    Last attempt to quit: 2010    Years since quitting: 9.3  . Smokeless tobacco: Former Systems developer    Quit date: 12/16/1995  Substance Use Topics  . Alcohol use: No    Alcohol/week: 0.6 oz    Types: 1 Standard drinks or equivalent per week    Comment: Beer   Objective:   BP 118/78 (BP Location: Left Arm, Patient  Position: Sitting, Cuff Size: Normal)   Pulse 60   Temp 97.8 F (36.6 C) (Oral)   Resp 16   Wt 218 lb (98.9 kg)   SpO2 98%   BMI 31.28 kg/m  Vitals:   12/07/17 0830  BP: 118/78  Pulse: 60  Resp: 16  Temp: 97.8 F (36.6 C)  TempSrc: Oral  SpO2: 98%  Weight: 218 lb (98.9 kg)     Physical Exam  Constitutional: He is oriented to person, place, and time. He appears well-developed and well-nourished.  HENT:  Head: Normocephalic and atraumatic.  Nose: Nose normal.  Eyes: Conjunctivae are normal. No scleral icterus.  Neck: No thyromegaly present.  Cardiovascular: Normal rate, regular rhythm and normal heart sounds.  Pulmonary/Chest: Effort normal and breath sounds normal.  Abdominal: Soft.  Neurological: He is alert and oriented to person, place, and time.  Skin: Skin is warm and dry.  Psychiatric: He has a normal mood and affect. His behavior is normal. Judgment and thought content normal.        Assessment & Plan:     1. Type 2 diabetes mellitus with complication, without long-term current use of insulin (HCC)  - POCT HgB A1C--6.3 today 2.Chronic Pain  Per Pain clinic 3.MDD Stable.  I have done the exam and reviewed the chart and it is accurate to the best of my knowledge. Development worker, community has been used and  any errors in dictation or transcription are unintentional. Miguel Aschoff M.D. [place], MD  [facility]

## 2017-12-09 ENCOUNTER — Ambulatory Visit: Payer: Self-pay | Admitting: Family Medicine

## 2017-12-16 DIAGNOSIS — M5442 Lumbago with sciatica, left side: Secondary | ICD-10-CM | POA: Diagnosis not present

## 2017-12-16 DIAGNOSIS — M542 Cervicalgia: Secondary | ICD-10-CM | POA: Diagnosis not present

## 2017-12-16 DIAGNOSIS — M5412 Radiculopathy, cervical region: Secondary | ICD-10-CM | POA: Diagnosis not present

## 2018-01-10 DIAGNOSIS — M5030 Other cervical disc degeneration, unspecified cervical region: Secondary | ICD-10-CM | POA: Diagnosis not present

## 2018-01-10 DIAGNOSIS — M545 Low back pain: Secondary | ICD-10-CM | POA: Diagnosis not present

## 2018-01-10 DIAGNOSIS — M5136 Other intervertebral disc degeneration, lumbar region: Secondary | ICD-10-CM | POA: Diagnosis not present

## 2018-01-17 ENCOUNTER — Other Ambulatory Visit: Payer: Self-pay | Admitting: Family Medicine

## 2018-01-17 DIAGNOSIS — I10 Essential (primary) hypertension: Secondary | ICD-10-CM

## 2018-02-07 ENCOUNTER — Telehealth: Payer: Self-pay | Admitting: Family Medicine

## 2018-02-07 DIAGNOSIS — M5030 Other cervical disc degeneration, unspecified cervical region: Secondary | ICD-10-CM | POA: Diagnosis not present

## 2018-02-07 DIAGNOSIS — M5136 Other intervertebral disc degeneration, lumbar region: Secondary | ICD-10-CM | POA: Diagnosis not present

## 2018-02-07 DIAGNOSIS — M545 Low back pain: Secondary | ICD-10-CM | POA: Diagnosis not present

## 2018-02-07 NOTE — Telephone Encounter (Signed)
Patient seen Dr. Primus Bravo today.   He wants Jeff [name] to stop taking Ambien so he can prescribe another sleeping medicine for him.   But Dr. Primus Bravo will not prescribe it until Dr. Rosanna Randy stops this.   Please let Jeff [name] know if this is ok to d/c.

## 2018-02-07 NOTE — Telephone Encounter (Signed)
Patient advised he could stop Ambien and try Melatonin

## 2018-02-08 NOTE — Telephone Encounter (Signed)
Patient needs you to contact Dr. Primus Bravo at pain clinic to inform him that it is ok for Ray to d/c the Ambien before he will give him the new "medicine".

## 2018-02-15 ENCOUNTER — Other Ambulatory Visit: Payer: Self-pay | Admitting: Family Medicine

## 2018-02-15 DIAGNOSIS — E118 Type 2 diabetes mellitus with unspecified complications: Secondary | ICD-10-CM

## 2018-02-15 DIAGNOSIS — M542 Cervicalgia: Secondary | ICD-10-CM | POA: Diagnosis not present

## 2018-02-15 NOTE — Telephone Encounter (Signed)
Advised Dr. Primus Bravo office as below.

## 2018-02-15 NOTE — Telephone Encounter (Signed)
Please call Dr. Primus Bravo at pain clinic to inform him that it is ok for Ray to d/c the Ambien so Dr. Primus Bravo can give Ray the new sleeping aid.

## 2018-02-23 DIAGNOSIS — M545 Low back pain: Secondary | ICD-10-CM | POA: Diagnosis not present

## 2018-02-23 DIAGNOSIS — M961 Postlaminectomy syndrome, not elsewhere classified: Secondary | ICD-10-CM | POA: Diagnosis not present

## 2018-02-23 DIAGNOSIS — M542 Cervicalgia: Secondary | ICD-10-CM | POA: Diagnosis not present

## 2018-02-25 ENCOUNTER — Encounter: Payer: Self-pay | Admitting: Podiatry

## 2018-02-25 ENCOUNTER — Ambulatory Visit: Payer: 59 | Admitting: Podiatry

## 2018-02-25 ENCOUNTER — Ambulatory Visit (INDEPENDENT_AMBULATORY_CARE_PROVIDER_SITE_OTHER): Payer: 59

## 2018-02-25 VITALS — Temp 97.5°F

## 2018-02-25 DIAGNOSIS — L97512 Non-pressure chronic ulcer of other part of right foot with fat layer exposed: Secondary | ICD-10-CM | POA: Diagnosis not present

## 2018-02-25 DIAGNOSIS — M869 Osteomyelitis, unspecified: Secondary | ICD-10-CM

## 2018-02-25 DIAGNOSIS — I70235 Atherosclerosis of native arteries of right leg with ulceration of other part of foot: Secondary | ICD-10-CM

## 2018-02-25 DIAGNOSIS — L03031 Cellulitis of right toe: Secondary | ICD-10-CM | POA: Diagnosis not present

## 2018-02-25 DIAGNOSIS — E0843 Diabetes mellitus due to underlying condition with diabetic autonomic (poly)neuropathy: Secondary | ICD-10-CM

## 2018-02-25 MED ORDER — SULFAMETHOXAZOLE-TRIMETHOPRIM 800-160 MG PO TABS: 1.0000 | ORAL_TABLET | Freq: Two times a day (BID) | ORAL | 1 refills | Status: DC

## 2018-02-25 NOTE — Patient Instructions (Signed)
Pre-Operative Instructions  Congratulations, you have decided to take an important step towards improving your quality of life.  You can be assured that the doctors and staff at [facility] will be with you every step of the way.  Here are some important things you should know:  1. Plan to be at the surgery center/hospital at least 1 (one) hour prior to your scheduled time, unless otherwise directed by the surgical center/hospital staff.  You must have a responsible adult accompany you, remain during the surgery and drive you home.  Make sure you have directions to the surgical center/hospital to ensure you arrive on time. 2. If you are having surgery at [facility] or [facility], you will need a copy of your medical history and physical form from your family physician within one month prior to the date of surgery. We will give you a form for your primary physician to complete.  3. We make every effort to accommodate the date you request for surgery.  However, there are times where surgery dates or times have to be moved.  We will contact you as soon as possible if a change in schedule is required.   4. No aspirin/ibuprofen for one week before surgery.  If you are on aspirin, any non-steroidal anti-inflammatory medications (Mobic, Aleve, Ibuprofen) should not be taken seven (7) days prior to your surgery.  You make take Tylenol for pain prior to surgery.  5. Medications - If you are taking daily heart and blood pressure medications, seizure, reflux, allergy, asthma, anxiety, pain or diabetes medications, make sure you notify the surgery center/hospital before the day of surgery so they can tell you which medications you should take or avoid the day of surgery. 6. No food or drink after midnight the night before surgery unless directed otherwise by surgical center/hospital staff. 7. No alcoholic beverages 24-hours prior to surgery.  No smoking 24-hours prior or 24-hours after  surgery. 8. Wear loose pants or shorts. They should be loose enough to fit over bandages, boots, and casts. 9. Don't wear slip-on shoes. Sneakers are preferred. 10. Bring your boot with you to the surgery center/hospital.  Also bring crutches or a walker if your physician has prescribed it for you.  If you do not have this equipment, it will be provided for you after surgery. 11. If you have not been contacted by the surgery center/hospital by the day before your surgery, call to confirm the date and time of your surgery. 12. Leave-time from work may vary depending on the type of surgery you have.  Appropriate arrangements should be made prior to surgery with your employer. 13. Prescriptions will be provided immediately following surgery by your doctor.  Fill these as soon as possible after surgery and take the medication as directed. Pain medications will not be refilled on weekends and must be approved by the doctor. 14. Remove nail polish on the operative foot and avoid getting pedicures prior to surgery. 15. Wash the night before surgery.  The night before surgery wash the foot and leg well with water and the antibacterial soap provided. Be sure to pay special attention to beneath the toenails and in between the toes.  Wash for at least three (3) minutes. Rinse thoroughly with water and dry well with a towel.  Perform this wash unless told not to do so by your physician.  Enclosed: 1 Ice pack (please put in freezer the night before surgery)   1 Hibiclens skin cleaner     Pre-op instructions  If you have any questions regarding the instructions, please do not hesitate to call our office.  [place]: [address], [place], [place] [address] -- [phone]  [place]: [address] [place], [place] [address] -- [phone]  [place]: [address]  , [place] [address] -- [phone]  [place]: [address], [address], [place], [place] [address] -- [phone]  Website:  [email or web address] 

## 2018-02-27 NOTE — H&P (View-Only) (Signed)
HPI: 57 year old male history of type 2 diabetes presents today for evaluation of an ulcer with cellulitis to the right fifth toe.  This began approximately 3 weeks ago.  He was last seen in the office on September 09, 2017.  Patient denies trauma and he says that the problem started with his toenail falling off of his fifth toe.  He is noticed significant swelling with extreme pain and there is been some drainage from a small ulceration.  He also notices swelling up into his ankle.  He has been applying peroxide and Neosporin.  He presents for further treatment evaluation  Past Medical History:  Diagnosis Date  . Cancer ([place])    SKIN  . Depression   . Diabetes mellitus without complication ([place])   . Fatigue   . Hyperlipidemia   . Hypertension   . Hypogonadism male   . Neuromuscular disorder ([place])    "back nerve stimulator"  . Neuropathy   . PAD (peripheral artery disease) ([place])   . Sleep apnea    CPAP  . Tinnitus   . Tuberculosis    POSITIVE  TB SKIN TEST 1992.6 MTH TX .was exposed to someone who had it.     Physical Exam: General: The patient is alert and oriented x3 in no acute distress.  Dermatology: Ulceration noted to the lateral aspect of the right fifth toe measuring approximately 0.2 x 0.2 x 0.3.  There is a heavy amount of purulent drainage that is expressed from the ulceration.  Vascular: Dorsalis pedis was palpable.  I could not palpate the posterior tibial artery of the right lower extremity.  There is a significant amount of erythema and edema noted to the right fifth toe.  This is somewhat localized with some minimal/moderate swelling throughout the foot and ankle.  Capillary refill within normal limits.  Neurological: Epicritic and protective threshold diminished bilaterally.   Musculoskeletal Exam: Range of motion within normal limits to all pedal and ankle joints bilateral. Muscle strength 5/5 in all groups bilateral.   Radiographic Exam:  Distal and middle  phalanx of the right fifth toe are almost completely eroded away likely secondary to osteomyelitis.  Proximal phalanx appears to be intact  Assessment: 1.  Osteomyelitis right fifth toe 2.  Cellulitis right foot 3.  Ulcer right fifth toe secondary to diabetes mellitus   Plan of Care:  1. Patient evaluated. X-Rays reviewed.  2.  Medically necessary excisional debridement including subcutaneous tissue was performed using a tissue nipper.  Excisional debridement of all the necrotic nonviable tissue down to healthy bleeding viable tissue was performed with post debridement measurement same as pre-. 3.  Cultures were taken and sent to pathology for culture and sensitivity. 4.  Prescription for Bactrim DS #28 5.  I explained to the patient that he needs emergent surgical intervention.  Patient will need right fifth toe amputation with an incision and drainage of the right foot.  In the meantime apply Betadine and dry sterile dressing daily 6.  I was able to palpate the DP artery but cannot palpate the PT artery of the right lower extremity.  Arterial Doppler ordered. 7.  Authorization for surgery initiated today.  Surgery will consist of amputation right fifth toe.  Incision and drainage right foot.  All possible complications and details the procedure were explained.  No guarantees were expressed or implied.  All patient questions answered. 8.  Return to clinic 1 week postop      Edrick Kins, DPM [facility]  Dr. Edrick Kins, DPM    [address], [place] [address]                Office [phone]  Fax [phone]

## 2018-02-27 NOTE — Progress Notes (Signed)
HPI: 57 year old male history of type 2 diabetes presents today for evaluation of an ulcer with cellulitis to the right fifth toe.  This began approximately 3 weeks ago.  He was last seen in the office on September 09, 2017.  Patient denies trauma and he says that the problem started with his toenail falling off of his fifth toe.  He is noticed significant swelling with extreme pain and there is been some drainage from a small ulceration.  He also notices swelling up into his ankle.  He has been applying peroxide and Neosporin.  He presents for further treatment evaluation  Past Medical History:  Diagnosis Date  . Cancer ([place])    SKIN  . Depression   . Diabetes mellitus without complication ([place])   . Fatigue   . Hyperlipidemia   . Hypertension   . Hypogonadism male   . Neuromuscular disorder ([place])    "back nerve stimulator"  . Neuropathy   . PAD (peripheral artery disease) ([place])   . Sleep apnea    CPAP  . Tinnitus   . Tuberculosis    POSITIVE  TB SKIN TEST 1992.6 MTH TX .was exposed to someone who had it.     Physical Exam: General: The patient is alert and oriented x3 in no acute distress.  Dermatology: Ulceration noted to the lateral aspect of the right fifth toe measuring approximately 0.2 x 0.2 x 0.3.  There is a heavy amount of purulent drainage that is expressed from the ulceration.  Vascular: Dorsalis pedis was palpable.  I could not palpate the posterior tibial artery of the right lower extremity.  There is a significant amount of erythema and edema noted to the right fifth toe.  This is somewhat localized with some minimal/moderate swelling throughout the foot and ankle.  Capillary refill within normal limits.  Neurological: Epicritic and protective threshold diminished bilaterally.   Musculoskeletal Exam: Range of motion within normal limits to all pedal and ankle joints bilateral. Muscle strength 5/5 in all groups bilateral.   Radiographic Exam:  Distal and middle  phalanx of the right fifth toe are almost completely eroded away likely secondary to osteomyelitis.  Proximal phalanx appears to be intact  Assessment: 1.  Osteomyelitis right fifth toe 2.  Cellulitis right foot 3.  Ulcer right fifth toe secondary to diabetes mellitus   Plan of Care:  1. Patient evaluated. X-Rays reviewed.  2.  Medically necessary excisional debridement including subcutaneous tissue was performed using a tissue nipper.  Excisional debridement of all the necrotic nonviable tissue down to healthy bleeding viable tissue was performed with post debridement measurement same as pre-. 3.  Cultures were taken and sent to pathology for culture and sensitivity. 4.  Prescription for Bactrim DS #28 5.  I explained to the patient that he needs emergent surgical intervention.  Patient will need right fifth toe amputation with an incision and drainage of the right foot.  In the meantime apply Betadine and dry sterile dressing daily 6.  I was able to palpate the DP artery but cannot palpate the PT artery of the right lower extremity.  Arterial Doppler ordered. 7.  Authorization for surgery initiated today.  Surgery will consist of amputation right fifth toe.  Incision and drainage right foot.  All possible complications and details the procedure were explained.  No guarantees were expressed or implied.  All patient questions answered. 8.  Return to clinic 1 week postop      Edrick Kins, DPM [facility]  Dr. Edrick Kins, DPM    [address], [place] [address]                Office [phone]  Fax [phone]

## 2018-02-28 LAB — WOUND CULTURE: Organism ID, Bacteria: NONE SEEN

## 2018-03-01 ENCOUNTER — Telehealth: Payer: Self-pay | Admitting: *Deleted

## 2018-03-01 NOTE — Telephone Encounter (Signed)
I called and asked the patient if he could do his surgery this Friday, 03/04/2018 at 2 pm.  He said that date would be fine.  I told him someone from [facility] would call him and give him his arrival time.  He said he has a physical scheduled for tomorrow.

## 2018-03-02 ENCOUNTER — Ambulatory Visit: Payer: 59 | Admitting: Family Medicine

## 2018-03-02 VITALS — BP 130/80 | HR 68 | Temp 98.0°F | Resp 16 | Wt 210.0 lb

## 2018-03-02 DIAGNOSIS — Z01818 Encounter for other preprocedural examination: Secondary | ICD-10-CM

## 2018-03-02 DIAGNOSIS — I10 Essential (primary) hypertension: Secondary | ICD-10-CM

## 2018-03-02 DIAGNOSIS — M86171 Other acute osteomyelitis, right ankle and foot: Secondary | ICD-10-CM

## 2018-03-02 DIAGNOSIS — E785 Hyperlipidemia, unspecified: Secondary | ICD-10-CM

## 2018-03-02 DIAGNOSIS — I70209 Unspecified atherosclerosis of native arteries of extremities, unspecified extremity: Secondary | ICD-10-CM

## 2018-03-02 DIAGNOSIS — E118 Type 2 diabetes mellitus with unspecified complications: Secondary | ICD-10-CM

## 2018-03-02 DIAGNOSIS — M5417 Radiculopathy, lumbosacral region: Secondary | ICD-10-CM

## 2018-03-02 DIAGNOSIS — G894 Chronic pain syndrome: Secondary | ICD-10-CM

## 2018-03-02 DIAGNOSIS — F3341 Major depressive disorder, recurrent, in partial remission: Secondary | ICD-10-CM

## 2018-03-02 NOTE — Progress Notes (Signed)
Jeff [name]  MRN: [id number] DOB: Jul 07, 1961  Subjective:  HPI   The patient is a 57 year old male who presents for surgical clearance.  He is scheduled to have surgery on March 04, 2018.  He will be having Right 5th toe amputation by Dr Amalia Hailey at [facility] secondary to osteomyelitis of the toe.   He denies having any personal or family history of and adverse effect with anesthesia, no bleeding or clotting disorder. Patient will have his daughter helping him after surgery.  His plan is to stay with her at her house.   Patient does not related any concerns about the surgery and states he is "just ready to get rid of the pain".  The surgeon has explained to the patient the reasons for the surgery and what the surgery will be.   Patient Active Problem List   Diagnosis Date Noted  . DDD (degenerative disc disease), cervical 02/25/2016  . H/O cervical spine surgery 02/25/2016  . Cervical facet syndrome 02/25/2016  . DDD (degenerative disc disease), lumbar 02/25/2016  . Facet syndrome, lumbar 02/25/2016  . Sacroiliac joint dysfunction 02/25/2016  . Atherosclerotic peripheral vascular disease ([place]) 02/25/2016  . Abnormal kidney function 12/06/2014  . Cervical nerve root disorder 12/06/2014  . Colon polyp 12/06/2014  . Clinical depression 12/06/2014  . Essential (primary) hypertension 12/06/2014  . Cephalalgia 12/06/2014  . Bulge of cervical disc without myelopathy 12/06/2014  . HLD (hyperlipidemia) 12/06/2014  . 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 Displacement of lumbar intervertebral disc without myelopathy 12/06/2014  . L-S radiculopathy 12/06/2014  . Mild major depression ([place]) 12/06/2014  . Neuropathy 12/06/2014  . Adiposity 12/06/2014  . Peripheral vascular disease ([place]) 12/06/2014  . Diabetes mellitus, type 2 ([place]) 12/06/2014  . Cervical post-laminectomy syndrome 10/04/2013  . Chronic neck pain 10/04/2013  . Peripheral neuropathic pain 10/04/2013  . Fatigue 03/17/2013  .  Chest pain 01/28/2013  . Seizures ([place]) 01/28/2013  . Syncope 01/28/2013  . Chronic pain associated with significant psychosocial dysfunction 08/17/2012  . Polypharmacy 08/02/2012  . 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 Post laminectomy syndrome 08/02/2012  . Thoracic spinal stenosis 06/22/2011    Past Medical History:  Diagnosis Date  . Cancer ([place])    SKIN  . Depression   . Diabetes mellitus without complication ([place])   . Fatigue   . Hyperlipidemia   . Hypertension   . Hypogonadism male   . Neuromuscular disorder ([place])    "back nerve stimulator"  . Neuropathy   . PAD (peripheral artery disease) ([place])   . Sleep apnea    CPAP  . Tinnitus   . Tuberculosis    POSITIVE  TB SKIN TEST 1992.6 MTH TX .was exposed to someone who had it.    Social History   Socioeconomic History  . Marital status: Widowed    Spouse name: Not on file  . Number of children: 3  . Years of education: 55  . Highest education level: Not on file  Occupational History  . Occupation: works at [employer]  . Occupation: Software engineer: [employer]  . Financial resource strain: Not on file  . Food insecurity:    Worry: Not on file    Inability: Not on file  . Transportation needs:    Medical: Not on file    Non-medical: Not on file  Tobacco Use  . Smoking status: Former Smoker    Packs/day: 1.50    Years: 30.00  Pack years: 45.00    Types: Cigarettes    Last attempt to quit: 2010    Years since quitting: 9.5  . Smokeless tobacco: Former Systems developer    Quit date: 12/16/1995  Substance and Sexual Activity  . Alcohol use: No    Alcohol/week: 0.6 oz    Types: 1 Standard drinks or equivalent per week    Comment: Beer  . Drug use: No  . Sexual activity: Yes    Birth control/protection: None  Lifestyle  . Physical activity:    Days per week: Not on file    Minutes per session: Not on file  . Stress: Not on file  Relationships  . Social connections:      Talks on phone: Not on file    Gets together: Not on file    Attends religious service: Not on file    Active member of club or organization: Not on file    Attends meetings of clubs or organizations: Not on file    Relationship status: Not on file  . Intimate partner violence:    Fear of current or ex partner: Not on file    Emotionally abused: Not on file    Physically abused: Not on file    Forced sexual activity: Not on file  Other Topics Concern  . Not on file  Social History Narrative   Lives at home alone, widowed   Right-handed   Caffeine: tea and soft drinks    Outpatient Encounter Medications as of 03/02/2018  Medication Sig Note  . ARIPiprazole (ABILIFY) 5 MG tablet TAKE 1 TABLET (5 MG TOTAL) BY MOUTH DAILY.   Marland Kitchen aspirin 81 MG tablet Take 81 mg by mouth daily.   Marland Kitchen atorvastatin (LIPITOR) 10 MG tablet TAKE 1 TABLET BY MOUTH AT BEDTIME 03/02/2018: Having surgery and has already stopped the Aspirin  . empagliflozin (JARDIANCE) 25 MG TABS tablet Take 25 mg by mouth daily.   . hydrochlorothiazide (HYDRODIURIL) 25 MG tablet TAKE 1 TABLET BY MOUTH EVERY DAY. *INSURANCE ONLY COVERS 30 DAYS**   . losartan (COZAAR) 100 MG tablet TAKE 1 TABLET BY MOUTH EVERY DAY   . LYRICA 100 MG capsule LIMIT 1 CAPSULE BY MOUTH 3 - 5 TIMES PER DAY IF TOLERATED (NOTE THAT CAPSULE IS NOW 100 MG SIZE)   . metFORMIN (GLUCOPHAGE) 1000 MG tablet TAKE 1 TABLET BY MOUTH TWICE A DAY WITH A MEAL   . metoprolol succinate (TOPROL-XL) 50 MG 24 hr tablet TAKE 1 TABLET (50 MG TOTAL) BY MOUTH DAILY.   . orphenadrine (NORFLEX) 100 MG tablet Take 100 mg by mouth 2 (two) times daily.   . Oxycodone HCl 10 MG TABS Take 1 tablet (10 mg total) by mouth every 4 (four) hours as needed. (Patient taking differently: Take 10 mg by mouth every 4 (four) hours as needed. )   . sulfamethoxazole-trimethoprim (BACTRIM DS,SEPTRA DS) 800-160 MG tablet Take 1 tablet by mouth 2 (two) times daily.   Marland Kitchen venlafaxine (EFFEXOR) 75 MG tablet  TAKE 3 TABLETS (225 MG TOTAL) BY MOUTH DAILY.   Marland Kitchen ONETOUCH VERIO test strip CHECK SUGAR ONCE DAILY DX E11.9 (Patient not taking: Reported on 03/02/2018)   . [DISCONTINUED] cyclobenzaprine (FLEXERIL) 10 MG tablet TAKE 1 TABLET (10 MG TOTAL) BY MOUTH 3 (THREE) TIMES DAILY AS NEEDED FOR MUSCLE SPASMS. (Patient not taking: Reported on 08/11/2017)   . [DISCONTINUED] pregabalin (LYRICA) 150 MG capsule Take 1 capsule (150 mg total) by mouth 3 (three) times daily. (Patient taking differently: Take  100 mg by mouth 5 (five) times daily. )   . [DISCONTINUED] silver sulfADIAZINE (SILVADENE) 1 % cream Apply 1 application topically daily.   . [DISCONTINUED] venlafaxine (EFFEXOR) 75 MG tablet Take 2 tablets (150 mg total) by mouth daily. (Patient not taking: Reported on 09/09/2017)    No facility-administered encounter medications on file as of 03/02/2018.     Allergies  Allergen Reactions  . Cymbalta [Duloxetine Hcl] Other (See Comments)    "jittery" and "loopy" per pt  . Neurontin [Gabapentin] Other (See Comments)    caused severe tremor, "jittery" and "loopy" per pt.  . Topamax  [Topiramate] Other (See Comments)    unresponsive  episodes  . Keppra [Levetiracetam] Other (See Comments) and Palpitations    "jittery", and "loopy." per pt.    Review of Systems  Constitutional: Negative for chills, diaphoresis, fever, malaise/fatigue and weight loss.  HENT: Positive for tinnitus. Negative for congestion, ear discharge, ear pain, hearing loss, nosebleeds, sinus pain and sore throat.   Eyes: Negative for blurred vision, double vision, photophobia, pain, discharge and redness.  Respiratory: Negative for cough, hemoptysis, sputum production, shortness of breath, wheezing and stridor.   Cardiovascular: Negative for chest pain, palpitations, orthopnea, claudication and leg swelling.  Gastrointestinal: Negative for abdominal pain, blood in stool, constipation, diarrhea, heartburn, melena, nausea and vomiting.    Genitourinary: Negative for dysuria, flank pain, frequency, hematuria and urgency.  Musculoskeletal: Positive for falls and joint pain. Negative for back pain, myalgias and neck pain.  Skin: Negative for itching and rash.  Neurological: Negative for dizziness, tingling, tremors, sensory change, speech change, focal weakness, seizures, loss of consciousness, weakness and headaches.  Endo/Heme/Allergies: Positive for polydipsia. Negative for environmental allergies. Does not bruise/bleed easily.  Psychiatric/Behavioral: Negative for depression, hallucinations, memory loss, substance abuse and suicidal ideas. The patient is not nervous/anxious and does not have insomnia.     Objective:  BP 130/80 (BP Location: Right Arm, Patient Position: Sitting, Cuff Size: Normal)   Pulse 68   Temp 98 F (36.7 C) (Oral)   Resp 16   Wt 210 lb (95.3 kg)   SpO2 98%   BMI 30.13 kg/m   Physical Exam  Constitutional: He is oriented to person, place, and time and well-developed, well-nourished, and in no distress.  HENT:  Head: Normocephalic and atraumatic.  Right Ear: External ear normal.  Left Ear: External ear normal.  Nose: Nose normal.  Eyes: Conjunctivae are normal.  Neck: No thyromegaly present.  Cardiovascular: Normal rate, regular rhythm, normal heart sounds and intact distal pulses.  Pulmonary/Chest: Effort normal and breath sounds normal.  Abdominal: Soft. Bowel sounds are normal.  Musculoskeletal: He exhibits edema.  Swelling with erythema of right distal /lateral foot--especially 5th toe.  Neurological: He is alert and oriented to person, place, and time. Gait normal. GCS score is 15.  Skin: Skin is warm and dry.  Psychiatric: Mood, memory, affect and judgment normal.    Assessment and Plan :  Right fifth toe cellulitis/osteomyelitis Pt cleared for amputation . TIIDM DDD/Radiculopathy HTN HLD MDD  I have done the exam and reviewed the chart and it is accurate to the best of my  knowledge. Development worker, community has been used and  any errors in dictation or transcription are unintentional. Miguel Aschoff M.D. [facility]

## 2018-03-03 ENCOUNTER — Encounter (HOSPITAL_COMMUNITY): Payer: Self-pay | Admitting: *Deleted

## 2018-03-03 ENCOUNTER — Other Ambulatory Visit: Payer: Self-pay

## 2018-03-03 NOTE — Progress Notes (Addendum)
Anesthesia Chart Review: Same Day Workup  Case:  [id number] Date/Time:  03/04/18 1445   Procedure:  AMPUTATION TOE/MPJ JOINT FIFTH RIGHT (Right )   Anesthesia type:  Monitor Anesthesia Care   Pre-op diagnosis:  osteomylitis   Location:  [facility] OR ROOM 14 / [place] [facility]   Surgeon:  Edrick Kins, DPM      DISCUSSION: 57 yo male former smoker for above procedure. Pertinent Hx includes HTN, PAD, Depression, OSA on CPAP, DOE, DMII, s/p SCS implantation.  Pt was seen by PCP Dr. Miguel Aschoff 03/02/2018 for surgical clearance. Per his note in Epic "Pt cleared for amputation ."  He had cardiac and neurologic workup in 2014 for episode of chest pain and syncope. EEG was normal. Cardiac cath showed only minimal disease of the LAD. Symptoms improved and ultimately it was felt to possibly be medication related and medications changes were made. Cardiology f/u on 03/17/2013 indicates he had any return of similar symptoms.  Anticipate he can proceed with surgery as planned barring acute status change  VS: There were no vitals taken for this visit.  PROVIDERS: Jerrol Banana., MD is PCP last seen 03/02/2018   LABS: Will need DOS labs   IMAGES: N/A  EKG: 03/02/2018:  Short P-R. LAFB. Somewhat difficult to interpret due to baseline noise, pt stated he felt this may have been due to spinal cord stimulator  09/17/2016: Short P-R. Left axis deviation  CV: Echo 01/28/2013 Study Conclusions  - Left ventricle: The cavity size was normal. Wall thickness was top normal. Systolic function was normal. The estimated ejection fraction was in the range of 55% to 60%. Wall motion was normal; there were no regional wall motion abnormalities. Left ventricular diastolic function parameters were normal. - Left atrium: The atrium was normal in size. - Pulmonic valve: Trivial regurgitation. - Pulmonary arteries: PA peak pressure: 28mm Hg (S). - Systemic veins: The IVC was not visualized.  Cath  01/31/2013: Coronary angiography:   Coronary dominance: Right  Left mainstem:   Distal left main calcification.    Left anterior descending (LAD):  Mild proximal calcification with long proximal 30% stenosis.   Mild proximal luminal irregularities.  Mid diagonal moderate sized and normal.    Left circumflex (LCx):  AV groove normal.  RI moderate sized and normal.  MOM 1 small and normal.  MOM 2 moderate sized and normal.  PL moderate sized and normal.   Right coronary artery (RCA):  Dominant and large.  Normal.  Large PDA normal.   Left ventriculography: Left ventricular systolic function is normal, LVEF is estimated at 65%, there is no significant mitral regurgitation   Final Conclusions:   Mild coronary calcification and plaque.  NL LV function.  Recommendations:   Medical management  Past Medical History:  Diagnosis Date  . Cancer ([place])    SKIN   . Depression   . Diabetes mellitus without complication ([place])    Type II  . Dyspnea    with exertion   . Fatigue   . Hyperlipidemia   . Hypertension   . Hypogonadism male   . Neuromuscular disorder ([place])    "back nerve stimulator"  . Neuropathy   . PAD (peripheral artery disease) ([place])   . Sleep apnea    CPAP  . Tinnitus   . Tuberculosis    POSITIVE  TB SKIN TEST 1992.6 MTH TX .was exposed to someone who had it.    Past Surgical History:  Procedure Laterality Date  .  BACK SURGERY  2012   neck was 2012,back same year. plate in [id number]  . CARDIAC CATHETERIZATION  01/31/2013   Medical management  . CARPAL TUNNEL RELEASE Bilateral   . CATARACT EXTRACTION Right 2014  . CATARACT EXTRACTION W/PHACO Left 03/12/2016   Procedure: CATARACT EXTRACTION PHACO AND INTRAOCULAR LENS PLACEMENT (IOC);  Surgeon: Birder Robson, MD;  Location: [facility] ORS;  Service: Ophthalmology;  Laterality: Left;  [place] 00:31AP% 17.9CDE 5.67Fluid pack lot # [id number]  . CERVICAL FUSION  2012  . CORONARY ANGIOPLASTY  2014   all good  . KNEE  ARTHROSCOPY Left   . LEFT HEART CATHETERIZATION WITH CORONARY ANGIOGRAM N/A 01/31/2013   Procedure: LEFT HEART CATHETERIZATION WITH CORONARY ANGIOGRAM;  Surgeon: Minus Breeding, MD;  Location: [facility] LAB;  Service: Cardiovascular;  Laterality: N/A;  . LUMBAR LAMINECTOMY/DECOMPRESSION MICRODISCECTOMY  06/22/2011   Procedure: LUMBAR LAMINECTOMY/DECOMPRESSION MICRODISCECTOMY;  Surgeon: Ophelia Charter;  Location: [place] NEURO ORS;  Service: Neurosurgery;  Laterality: N/A;  Thoracic Ten-Eleven,Thoracic Eleven-Twelve Laminectomy  . Pain stimulator    . ulnar N/A   . VASECTOMY  1991    MEDICATIONS: No current facility-administered medications for this encounter.    . ARIPiprazole (ABILIFY) 5 MG tablet  . atorvastatin (LIPITOR) 10 MG tablet  . empagliflozin (JARDIANCE) 25 MG TABS tablet  . hydrochlorothiazide (HYDRODIURIL) 25 MG tablet  . losartan (COZAAR) 100 MG tablet  . LYRICA 100 MG capsule  . metFORMIN (GLUCOPHAGE) 1000 MG tablet  . metoprolol succinate (TOPROL-XL) 50 MG 24 hr tablet  . orphenadrine (NORFLEX) 100 MG tablet  . Oxycodone HCl 10 MG TABS  . sulfamethoxazole-trimethoprim (BACTRIM DS,SEPTRA DS) 800-160 MG tablet  . venlafaxine (EFFEXOR) 75 MG tablet  . aspirin 81 MG tablet  . [facility] VERIO test strip    [place] [facility] Short Stay Center/Anesthesiology Phone [phone] 03/03/2018 1:10 PM

## 2018-03-03 NOTE — Progress Notes (Signed)
Jeff [name] denies chest pain or shortness of breath.  Patient has type II diabetes.  Jeff [name] states that he checks CBG rarely.  Last A!C was 6.3 in June.  I instructed patient to check CBG a couple tiomes today and to see what it is running.  I informed patient that the goal is for CBGto be <180 post op  For healing and to help prevent infection. I instructed patient to hold diabetic mediatation in am. I instructed patient to check CBG after awaking and every 2 hours until arrival  to the hospital.  I Instructed patient if CBG is less than 70 to take 4 Glucose Tablets. Recheck CBG in 15 minutes then call pre- op desk at [phone] for further instructions.

## 2018-03-04 ENCOUNTER — Encounter: Payer: Self-pay | Admitting: Podiatry

## 2018-03-04 ENCOUNTER — Ambulatory Visit (HOSPITAL_COMMUNITY)
Admission: RE | Admit: 2018-03-04 | Discharge: 2018-03-04 | Disposition: A | Discharge status: Home / Self Care | Payer: 59 | Source: Ambulatory Visit | Attending: Podiatry | Admitting: Podiatry

## 2018-03-04 ENCOUNTER — Encounter (HOSPITAL_COMMUNITY)
Admission: RE | Disposition: A | Discharge status: Home / Self Care | Payer: Self-pay | Source: Ambulatory Visit | Attending: Podiatry

## 2018-03-04 ENCOUNTER — Ambulatory Visit (HOSPITAL_COMMUNITY): Payer: 59

## 2018-03-04 ENCOUNTER — Ambulatory Visit (HOSPITAL_COMMUNITY): Payer: 59 | Admitting: Physician Assistant

## 2018-03-04 ENCOUNTER — Other Ambulatory Visit: Payer: Self-pay

## 2018-03-04 ENCOUNTER — Encounter (HOSPITAL_COMMUNITY): Payer: Self-pay

## 2018-03-04 DIAGNOSIS — G4730 Sleep apnea, unspecified: Secondary | ICD-10-CM | POA: Insufficient documentation

## 2018-03-04 DIAGNOSIS — Z7984 Long term (current) use of oral hypoglycemic drugs: Secondary | ICD-10-CM | POA: Diagnosis not present

## 2018-03-04 DIAGNOSIS — Z79899 Other long term (current) drug therapy: Secondary | ICD-10-CM | POA: Diagnosis not present

## 2018-03-04 DIAGNOSIS — E11621 Type 2 diabetes mellitus with foot ulcer: Secondary | ICD-10-CM | POA: Insufficient documentation

## 2018-03-04 DIAGNOSIS — M868X7 Other osteomyelitis, ankle and foot: Secondary | ICD-10-CM | POA: Insufficient documentation

## 2018-03-04 DIAGNOSIS — E785 Hyperlipidemia, unspecified: Secondary | ICD-10-CM | POA: Diagnosis not present

## 2018-03-04 DIAGNOSIS — F329 Major depressive disorder, single episode, unspecified: Secondary | ICD-10-CM | POA: Diagnosis not present

## 2018-03-04 DIAGNOSIS — I10 Essential (primary) hypertension: Secondary | ICD-10-CM | POA: Diagnosis not present

## 2018-03-04 DIAGNOSIS — L97512 Non-pressure chronic ulcer of other part of right foot with fat layer exposed: Secondary | ICD-10-CM | POA: Diagnosis not present

## 2018-03-04 DIAGNOSIS — M86671 Other chronic osteomyelitis, right ankle and foot: Secondary | ICD-10-CM | POA: Diagnosis not present

## 2018-03-04 DIAGNOSIS — Z7982 Long term (current) use of aspirin: Secondary | ICD-10-CM | POA: Insufficient documentation

## 2018-03-04 DIAGNOSIS — I739 Peripheral vascular disease, unspecified: Secondary | ICD-10-CM | POA: Diagnosis not present

## 2018-03-04 DIAGNOSIS — L97519 Non-pressure chronic ulcer of other part of right foot with unspecified severity: Secondary | ICD-10-CM | POA: Insufficient documentation

## 2018-03-04 DIAGNOSIS — Z89421 Acquired absence of other right toe(s): Secondary | ICD-10-CM | POA: Diagnosis not present

## 2018-03-04 DIAGNOSIS — L02611 Cutaneous abscess of right foot: Secondary | ICD-10-CM | POA: Insufficient documentation

## 2018-03-04 DIAGNOSIS — E1169 Type 2 diabetes mellitus with other specified complication: Secondary | ICD-10-CM | POA: Insufficient documentation

## 2018-03-04 DIAGNOSIS — Z9889 Other specified postprocedural states: Secondary | ICD-10-CM

## 2018-03-04 HISTORY — DX: Dyspnea, unspecified: R06.00

## 2018-03-04 HISTORY — PX: AMPUTATION TOE: SHX6595

## 2018-03-04 LAB — CBC
HEMATOCRIT: 46.2 % (ref 39.0–52.0)
HEMOGLOBIN: 15.5 g/dL (ref 13.0–17.0)
MCH: 32.4 pg (ref 26.0–34.0)
MCHC: 33.5 g/dL (ref 30.0–36.0)
MCV: 96.7 fL (ref 78.0–100.0)
Platelets: 278 10*3/uL (ref 150–400)
RBC: 4.78 MIL/uL (ref 4.22–5.81)
RDW: 12.2 % (ref 11.5–15.5)
WBC: 8.4 10*3/uL (ref 4.0–10.5)

## 2018-03-04 LAB — BASIC METABOLIC PANEL
Anion gap: 9 (ref 5–15)
BUN: 20 mg/dL (ref 6–20)
CHLORIDE: 105 mmol/L (ref 98–111)
CO2: 21 mmol/L — AB (ref 22–32)
CREATININE: 1.44 mg/dL — AB (ref 0.61–1.24)
Calcium: 8.8 mg/dL — ABNORMAL LOW (ref 8.9–10.3)
GFR calc Af Amer: 60 mL/min — ABNORMAL LOW (ref >60)
GFR calc non Af Amer: 52 mL/min — ABNORMAL LOW (ref >60)
GLUCOSE: 103 mg/dL — AB (ref 70–99)
Potassium: 4.2 mmol/L (ref 3.5–5.1)
SODIUM: 135 mmol/L (ref 135–145)

## 2018-03-04 LAB — GLUCOSE, CAPILLARY
GLUCOSE-CAPILLARY: 85 mg/dL (ref 70–99)
Glucose-Capillary: 96 mg/dL (ref 70–99)
Glucose-Capillary: 84 mg/dL (ref 70–99)

## 2018-03-04 LAB — HEMOGLOBIN A1C
Hgb A1c MFr Bld: 5.7 % — ABNORMAL HIGH (ref 4.8–5.6)
Mean Plasma Glucose: 116.89 mg/dL

## 2018-03-04 SURGERY — AMPUTATION, TOE
Anesthesia: Monitor Anesthesia Care | Laterality: Right

## 2018-03-04 MED ORDER — FENTANYL CITRATE (PF) 250 MCG/5ML IJ SOLN
INTRAMUSCULAR | Status: DC | PRN
  Administered 2018-03-04: 50 ug via INTRAVENOUS

## 2018-03-04 MED ORDER — PROPOFOL 10 MG/ML IV BOLUS
INTRAVENOUS | Status: DC | PRN
  Administered 2018-03-04: 10 mg via INTRAVENOUS
  Administered 2018-03-04: 20 mg via INTRAVENOUS
  Administered 2018-03-04 (×3): 10 mg via INTRAVENOUS
  Administered 2018-03-04: 40 mg via INTRAVENOUS
  Administered 2018-03-04: 20 mg via INTRAVENOUS
  Administered 2018-03-04 (×4): 10 mg via INTRAVENOUS
  Administered 2018-03-04 (×2): 20 mg via INTRAVENOUS
  Administered 2018-03-04 (×4): 10 mg via INTRAVENOUS

## 2018-03-04 MED ORDER — MEPERIDINE HCL 50 MG/ML IJ SOLN: 6.2500 mg | INTRAMUSCULAR | Status: DC | PRN

## 2018-03-04 MED ORDER — METOPROLOL SUCCINATE ER 25 MG PO TB24
ORAL_TABLET | ORAL | Status: AC
  Filled 2018-03-04: qty 2

## 2018-03-04 MED ORDER — ACETAMINOPHEN 325 MG PO TABS: 325.0000 mg | ORAL_TABLET | ORAL | Status: DC | PRN

## 2018-03-04 MED ORDER — FENTANYL CITRATE (PF) 250 MCG/5ML IJ SOLN
INTRAMUSCULAR | Status: AC
  Filled 2018-03-04: qty 5

## 2018-03-04 MED ORDER — LACTATED RINGERS IV SOLN
INTRAVENOUS | Status: DC
  Administered 2018-03-04: 16:00:00 via INTRAVENOUS

## 2018-03-04 MED ORDER — OXYCODONE HCL 5 MG/5ML PO SOLN: 5.0000 mg | Freq: Once | ORAL | Status: DC | PRN

## 2018-03-04 MED ORDER — LIDOCAINE HCL (CARDIAC) PF 100 MG/5ML IV SOSY
PREFILLED_SYRINGE | INTRAVENOUS | Status: DC | PRN
  Administered 2018-03-04: 40 mg via INTRAVENOUS

## 2018-03-04 MED ORDER — ONDANSETRON HCL 4 MG/2ML IJ SOLN
INTRAMUSCULAR | Status: DC | PRN
  Administered 2018-03-04: 4 mg via INTRAVENOUS

## 2018-03-04 MED ORDER — MIDAZOLAM HCL 2 MG/2ML IJ SOLN: 2.0000 mg | Freq: Once | INTRAMUSCULAR | Status: DC

## 2018-03-04 MED ORDER — EPINEPHRINE PF 1 MG/ML IJ SOLN
INTRAMUSCULAR | Status: AC
  Filled 2018-03-04: qty 1

## 2018-03-04 MED ORDER — 0.9 % SODIUM CHLORIDE (POUR BTL) OPTIME
TOPICAL | Status: DC | PRN
  Administered 2018-03-04: 1000 mL

## 2018-03-04 MED ORDER — CEFAZOLIN SODIUM 1 G IJ SOLR
INTRAMUSCULAR | Status: AC
  Filled 2018-03-04: qty 20

## 2018-03-04 MED ORDER — BUPIVACAINE HCL (PF) 0.5 % IJ SOLN
INTRAMUSCULAR | Status: DC | PRN
  Administered 2018-03-04: 5 mL

## 2018-03-04 MED ORDER — MIDAZOLAM HCL 2 MG/2ML IJ SOLN
INTRAMUSCULAR | Status: DC | PRN
  Administered 2018-03-04: 2 mg via INTRAVENOUS

## 2018-03-04 MED ORDER — PROPOFOL 10 MG/ML IV BOLUS
INTRAVENOUS | Status: AC
  Filled 2018-03-04: qty 20

## 2018-03-04 MED ORDER — FENTANYL CITRATE (PF) 100 MCG/2ML IJ SOLN: 100.0000 ug | Freq: Once | INTRAMUSCULAR | Status: DC

## 2018-03-04 MED ORDER — FENTANYL CITRATE (PF) 100 MCG/2ML IJ SOLN: 25.0000 ug | INTRAMUSCULAR | Status: DC | PRN

## 2018-03-04 MED ORDER — BUPIVACAINE HCL (PF) 0.5 % IJ SOLN
INTRAMUSCULAR | Status: AC
  Filled 2018-03-04: qty 30

## 2018-03-04 MED ORDER — LIDOCAINE 2% (20 MG/ML) 5 ML SYRINGE
INTRAMUSCULAR | Status: AC
  Filled 2018-03-04: qty 5

## 2018-03-04 MED ORDER — ACETAMINOPHEN 160 MG/5ML PO SOLN: 325.0000 mg | ORAL | Status: DC | PRN

## 2018-03-04 MED ORDER — LIDOCAINE HCL 2 % IJ SOLN
INTRAMUSCULAR | Status: AC
  Filled 2018-03-04: qty 20

## 2018-03-04 MED ORDER — CEFAZOLIN SODIUM-DEXTROSE 2-3 GM-%(50ML) IV SOLR
INTRAVENOUS | Status: DC | PRN
  Administered 2018-03-04: 2 g via INTRAVENOUS

## 2018-03-04 MED ORDER — ONDANSETRON HCL 4 MG/2ML IJ SOLN
INTRAMUSCULAR | Status: AC
Start: 2018-03-04 — End: ?
  Filled 2018-03-04: qty 2

## 2018-03-04 MED ORDER — ONDANSETRON HCL 4 MG/2ML IJ SOLN: 4.0000 mg | Freq: Once | INTRAMUSCULAR | Status: DC | PRN

## 2018-03-04 MED ORDER — LIDOCAINE HCL 1 % IJ SOLN
INTRAMUSCULAR | Status: DC | PRN
  Administered 2018-03-04: 5 mL

## 2018-03-04 MED ORDER — MIDAZOLAM HCL 2 MG/2ML IJ SOLN
INTRAMUSCULAR | Status: AC
  Filled 2018-03-04: qty 2

## 2018-03-04 MED ORDER — OXYCODONE HCL 5 MG PO TABS: 5.0000 mg | ORAL_TABLET | Freq: Once | ORAL | Status: DC | PRN

## 2018-03-04 MED ORDER — METOPROLOL SUCCINATE ER 50 MG PO TB24
50.0000 mg | ORAL_TABLET | Freq: Every day | ORAL | Status: DC
  Administered 2018-03-04: 50 mg via ORAL
  Filled 2018-03-04: qty 1

## 2018-03-04 MED ORDER — LIDOCAINE HCL 1 % IJ SOLN
INTRAMUSCULAR | Status: AC
  Filled 2018-03-04: qty 20

## 2018-03-04 MED ORDER — CHLORHEXIDINE GLUCONATE 4 % EX LIQD: 60.0000 mL | Freq: Once | CUTANEOUS | Status: DC

## 2018-03-04 SURGICAL SUPPLY — 37 items
BANDAGE ACE 4X5 VEL STRL LF (GAUZE/BANDAGES/DRESSINGS) ×1 IMPLANT
BLADE SURG 15 STRL LF DISP TIS (BLADE) IMPLANT
BLADE SURG 15 STRL SS (BLADE) ×2
BNDG GAUZE ELAST 4 BULKY (GAUZE/BANDAGES/DRESSINGS) ×2 IMPLANT
CHLORAPREP W/TINT 26ML (MISCELLANEOUS) ×1 IMPLANT
COVER SURGICAL LIGHT HANDLE (MISCELLANEOUS) ×2 IMPLANT
CUFF TOURNIQUET SINGLE 18IN (TOURNIQUET CUFF) ×2 IMPLANT
CUFF TOURNIQUET SINGLE 24IN (TOURNIQUET CUFF) IMPLANT
DRSG PAD ABDOMINAL 8X10 ST (GAUZE/BANDAGES/DRESSINGS) ×1 IMPLANT
ELECT REM PT RETURN 9FT ADLT (ELECTROSURGICAL) ×2
ELECTRODE REM PT RTRN 9FT ADLT (ELECTROSURGICAL) IMPLANT
GAUZE SPONGE 4X4 12PLY STRL (GAUZE/BANDAGES/DRESSINGS) ×2 IMPLANT
GAUZE SPONGE 4X4 12PLY STRL LF (GAUZE/BANDAGES/DRESSINGS) ×1 IMPLANT
GAUZE XEROFORM 1X8 LF (GAUZE/BANDAGES/DRESSINGS) ×2 IMPLANT
GLOVE BIO SURGEON STRL SZ8 (GLOVE) ×2 IMPLANT
GLOVE BIOGEL PI IND STRL 8 (GLOVE) ×1 IMPLANT
GLOVE BIOGEL PI INDICATOR 8 (GLOVE) ×1
GOWN STRL REUS W/ TWL LRG LVL3 (GOWN DISPOSABLE) ×2 IMPLANT
GOWN STRL REUS W/TWL LRG LVL3 (GOWN DISPOSABLE) ×4
KIT BASIN OR (CUSTOM PROCEDURE TRAY) ×2 IMPLANT
KIT TURNOVER KIT B (KITS) ×2 IMPLANT
NEEDLE 27GAX1X1/2 (NEEDLE) ×2 IMPLANT
NS IRRIG 1000ML POUR BTL (IV SOLUTION) ×2 IMPLANT
PACK ORTHO EXTREMITY (CUSTOM PROCEDURE TRAY) ×2 IMPLANT
PAD ARMBOARD 7.5X6 YLW CONV (MISCELLANEOUS) ×4 IMPLANT
PAD CAST 4YDX4 CTTN HI CHSV (CAST SUPPLIES) ×1 IMPLANT
PADDING CAST COTTON 4X4 STRL (CAST SUPPLIES) ×2
SCRUB BETADINE 4OZ XXX (MISCELLANEOUS) ×2 IMPLANT
SOL PREP POV-IOD 4OZ 10% (MISCELLANEOUS) IMPLANT
SUT PROLENE 4 0 PS 2 18 (SUTURE) ×2 IMPLANT
SUT VIC AB 3-0 PS2 18 (SUTURE) ×1 IMPLANT
SUT VICRYL 4-0 PS2 18IN ABS (SUTURE) ×1 IMPLANT
SYR CONTROL 10ML LL (SYRINGE) ×2 IMPLANT
TOWEL OR 17X24 6PK STRL BLUE (TOWEL DISPOSABLE) ×2 IMPLANT
TOWEL OR 17X26 10 PK STRL BLUE (TOWEL DISPOSABLE) ×2 IMPLANT
TUBE CONNECTING 12X1/4 (SUCTIONS) ×2 IMPLANT
YANKAUER SUCT BULB TIP NO VENT (SUCTIONS) IMPLANT

## 2018-03-04 NOTE — Discharge Instructions (Signed)

## 2018-03-04 NOTE — Op Note (Signed)
OPERATIVE REPORT Patient name: Jeff [name] MRN: [id number] DOB: 1961/07/04  DOS:  03/04/2018  Preop Dx: Osteomyelitis right 5th toe. Abscess right foot.  3 Postop Dx: same  Procedure:  1. Amputation right fifth toe.  2. Incision and drainage right foot  Surgeon: Edrick Kins DPM  Anesthesia: 50-50 mixture of 2% lidocaine plain with 0.5% Marcaine plain totaling infiltrated in the patient's right degeneration because of my exam lower extremity  Hemostasis: Ankle tourniquet inflated to a pressure of 231mmHg after esmarch exsanguination   EBL: 2 mL Materials: none Injectables: none Pathology: none  Condition: The patient tolerated the procedure and anesthesia well. No complications noted or reported   Justification for procedure: The patient is a 57 y.o. male who presents today for surgical correction of osteomyelitis to the right fifth toe with abscess right  foot. All conservative modalities of been unsuccessful in providing any sort of satisfactory alleviation of symptoms with the patient. The patient was told benefits as well as possible side effects of the surgery. The patient consented for surgical correction. The patient consent form was reviewed. All patient questions were answered. No guarantees were expressed or implied. The patient and the surgeon boson the patient consent form with the witness present and placed in the patient's chart.   Procedure in Detail: The patient was brought to the operating room, placed in the operating table in the supine position at which time an aseptic scrub and drape were performed about the patient's respective lower extremity after anesthesia was induced as described above. Attention was then directed to the surgical area where procedure number one commenced.  Procedure #1: Amputation fifth digit right foot A fishmouth type incision was planned and made around the fifth MTPJ of the right foot.  Incision was carried down the level of  joint capsule with care taken to cut clamp ligate to retract well small neurovascular structures traversing the incision site. The toe was distracted distally and all soft tissue structures surrounding the fifth MTPJ were sharply dissected and toes removed in toto and placed in sterile specimen container. Attention was then directed to the soft tissue structures of the foot where procedure #2 commenced.  Procedure #2: Incision and drainage right foot Blunt soft tissue dissection was performed using a curved mosquito to the deep compartments of the foot.  Any purulent drainage was expressed.  Deep wound cultures were taken and sent to pathology for culture and sensitivity.  Copious irrigation was then utilized in preparation for routine layered primary closure.  4-0 Prolene suture was utilized to reapproximate superficial skin edges of the incision site.  Dry sterile compressive dressings were then applied to all previously mentioned incision sites about the patient's lower extremity. The tourniquet which was used for hemostasis was deflated. All normal neurovascular responses including pink color and warmth returned all the digits of patient's lower extremity.  The patient was then transferred from the operating room to the recovery room having tolerated the procedure and anesthesia well. All vital signs are stable. After a brief stay in the recovery room the patient was discharged with adequate prescriptions for analgesia.  Patient is currently on oral Bactrim DS. verbal as well as written instructions were provided for the patient regarding wound care. The patient is to keep the dressings clean dry and intact until they are to follow surgeon Dr. Daylene Katayama in the office upon discharge.   Edrick Kins, DPM [facility]  Dr. Edrick Kins, DPM  [address]                                        [place], [place] [address]                Office [phone]  Fax [phone]

## 2018-03-04 NOTE — Interval H&P Note (Signed)
History and Physical Interval Note:  03/04/2018 1:34 PM  Jeff [name]  has presented today for surgery, with the diagnosis of osteomylitis  The various methods of treatment have been discussed with the patient and family. After consideration of risks, benefits and other options for treatment, the patient has consented to  Procedure(s): AMPUTATION TOE/MPJ JOINT FIFTH RIGHT (Right) as a surgical intervention .  The patient's history has been reviewed, patient examined, no change in status, stable for surgery.  I have reviewed the patient's chart and labs.  Questions were answered to the patient's satisfaction.     Edrick Kins

## 2018-03-04 NOTE — Transfer of Care (Signed)
Immediate Anesthesia Transfer of Care Note  Patient: Jeff [name]  Procedure(s) Performed: AMPUTATION TOE/MPJ JOINT FIFTH RIGHT (Right )  Patient Location: PACU  Anesthesia Type:MAC  Level of Consciousness: awake, alert , oriented, drowsy and patient cooperative  Airway & Oxygen Therapy: Patient Spontanous Breathing and Patient connected to nasal cannula oxygen  Post-op Assessment: Report given to RN and Post -op Vital signs reviewed and stable  Post vital signs: Reviewed and stable  Last Vitals:  Vitals Value Taken Time  BP 131/81 03/04/2018  4:16 PM  Temp    Pulse 62 03/04/2018  4:16 PM  Resp    SpO2 100 % 03/04/2018  4:16 PM  Vitals shown include unvalidated device data.  Last Pain:  Vitals:   03/04/18 1255  TempSrc:   PainSc: 6       Patients Stated Pain Goal: 3 (19/14/78 2956)  Complications: No apparent anesthesia complications

## 2018-03-04 NOTE — Anesthesia Preprocedure Evaluation (Signed)
Anesthesia Evaluation  Patient identified by MRN, date of birth, ID band Patient awake    Reviewed: Allergy & Precautions, NPO status , Patient's Chart, lab work & pertinent test results, reviewed documented beta blocker date and time   Airway Mallampati: I       Dental no notable dental hx. (+) Teeth Intact   Pulmonary former smoker,    Pulmonary exam normal breath sounds clear to auscultation       Cardiovascular hypertension, Pt. on medications and Pt. on home beta blockers Normal cardiovascular exam Rhythm:Regular Rate:Normal     Neuro/Psych    GI/Hepatic negative GI ROS, Neg liver ROS,   Endo/Other  diabetes  Renal/GU negative Renal ROS  negative genitourinary   Musculoskeletal   Abdominal (+) + obese,   Peds  Hematology   Anesthesia Other Findings   Reproductive/Obstetrics                             Anesthesia Physical Anesthesia Plan  ASA: II  Anesthesia Plan: MAC   Post-op Pain Management:    Induction:   PONV Risk Score and Plan: 2 and Ondansetron  Airway Management Planned: Natural Airway and Nasal Cannula  Additional Equipment:   Intra-op Plan:   Post-operative Plan:   Informed Consent: I have reviewed the patients History and Physical, chart, labs and discussed the procedure including the risks, benefits and alternatives for the proposed anesthesia with the patient or authorized representative who has indicated his/her understanding and acceptance.     Plan Discussed with: CRNA and Surgeon  Anesthesia Plan Comments:         Anesthesia Quick Evaluation

## 2018-03-04 NOTE — Brief Op Note (Signed)
03/04/2018  4:09 PM  PATIENT:  [name]  57 y.o. male  PRE-OPERATIVE DIAGNOSIS:  osteomylitis  POST-OPERATIVE DIAGNOSIS:  osteomylitis  PROCEDURE:  Procedure(s): AMPUTATION TOE/MPJ JOINT FIFTH RIGHT (Right)  SURGEON:  Surgeon(s) and Role:    Edrick Kins, DPM - Primary  PHYSICIAN ASSISTANT:   ASSISTANTS: none   ANESTHESIA:   local and MAC  EBL:  5 mL   BLOOD ADMINISTERED:none  DRAINS: none   LOCAL MEDICATIONS USED:  MARCAINE    and LIDOCAINE   SPECIMEN:  No Specimen  DISPOSITION OF SPECIMEN:  N/A  COUNTS:  YES  TOURNIQUET:   Total Tourniquet Time Documented: Calf (Right) - 14 minutes Total: Calf (Right) - 14 minutes   DICTATION: .Viviann Spare Dictation  PLAN OF CARE: Discharge to home after PACU  PATIENT DISPOSITION:  PACU - hemodynamically stable.   Delay start of Pharmacological VTE agent (>24hrs) due to surgical blood loss or risk of bleeding: not applicable  Edrick Kins, DPM [facility]  Dr. Edrick Kins, DPM    [address], [place] [address]                Office [phone]  Fax [phone]

## 2018-03-04 NOTE — Progress Notes (Signed)
Orthopedic Tech Progress Note Patient Details:  Jeff [name] May 29, 1961 [id number]  Ortho Devices Type of Ortho Device: Postop shoe/boot Ortho Device/Splint Interventions: Application   Post Interventions Patient Tolerated: Well Instructions Provided: Care of device   [place] Pink 03/04/2018, 5:10 PM

## 2018-03-07 ENCOUNTER — Encounter (HOSPITAL_COMMUNITY): Payer: Self-pay | Admitting: Podiatry

## 2018-03-07 NOTE — Anesthesia Postprocedure Evaluation (Signed)
Anesthesia Post Note  Patient: Jeff [name]  Procedure(s) Performed: AMPUTATION TOE/MPJ JOINT FIFTH RIGHT (Right )     Patient location during evaluation: PACU Anesthesia Type: MAC Level of consciousness: awake Pain management: pain level controlled Vital Signs Assessment: post-procedure vital signs reviewed and stable Respiratory status: spontaneous breathing Cardiovascular status: stable Postop Assessment: no apparent nausea or vomiting Anesthetic complications: no    Last Vitals:  Vitals:   03/04/18 1631 03/04/18 1754  BP: 135/83 (!) 169/77  Pulse: 61 (!) 50  Resp: 16 16  Temp:  36.6 C  SpO2: 98% 100%    Last Pain:  Vitals:   03/04/18 1616  TempSrc:   PainSc: 0-No pain   Pain Goal: Patients Stated Pain Goal: 3 (03/04/18 1255)               [place]

## 2018-03-08 ENCOUNTER — Telehealth: Payer: Self-pay

## 2018-03-08 NOTE — Telephone Encounter (Signed)
-----   Message from Edrick Kins, DPM sent at 02/25/2018  8:56 AM EDT ----- Regarding: arterial doppler Please order arterial doppler RLE.   Dx: PVD RT.

## 2018-03-08 NOTE — Telephone Encounter (Signed)
Per Dr. Amalia Hailey verbal, we will wait on arterial dopplers until seen at first post op visit.  He had surgery on 03/04/18

## 2018-03-10 LAB — AEROBIC/ANAEROBIC CULTURE (SURGICAL/DEEP WOUND)

## 2018-03-10 LAB — AEROBIC/ANAEROBIC CULTURE W GRAM STAIN (SURGICAL/DEEP WOUND)

## 2018-03-11 ENCOUNTER — Ambulatory Visit (INDEPENDENT_AMBULATORY_CARE_PROVIDER_SITE_OTHER): Payer: 59 | Admitting: Podiatry

## 2018-03-11 ENCOUNTER — Encounter: Payer: Self-pay | Admitting: Podiatry

## 2018-03-11 ENCOUNTER — Ambulatory Visit (INDEPENDENT_AMBULATORY_CARE_PROVIDER_SITE_OTHER): Payer: 59

## 2018-03-11 VITALS — BP 106/76 | HR 66 | Temp 97.6°F

## 2018-03-11 DIAGNOSIS — Z9889 Other specified postprocedural states: Secondary | ICD-10-CM

## 2018-03-11 DIAGNOSIS — M869 Osteomyelitis, unspecified: Secondary | ICD-10-CM

## 2018-03-11 MED ORDER — CIPROFLOXACIN HCL 500 MG PO TABS: 500.0000 mg | ORAL_TABLET | Freq: Two times a day (BID) | ORAL | 0 refills | Status: DC

## 2018-03-11 MED ORDER — OXYCODONE-ACETAMINOPHEN 5-325 MG PO TABS: 1.0000 | ORAL_TABLET | Freq: Four times a day (QID) | ORAL | 0 refills | Status: DC | PRN

## 2018-03-14 NOTE — Progress Notes (Signed)
   Subjective:  Patient presents today status post right 5th toe amputation. DOS: 03/04/18. He reports some pain with walking but has been taking the pain medication which has helped alleviate the pain. He denies any nausea, vomiting, fever or chills. Patient is here for further evaluation and treatment.    Past Medical History:  Diagnosis Date  . Cancer ([place])    SKIN   . Depression   . Diabetes mellitus without complication ([place])    Type II  . Dyspnea    with exertion   . Fatigue   . Hyperlipidemia   . Hypertension   . Hypogonadism male   . Neuromuscular disorder ([place])    "back nerve stimulator"  . Neuropathy   . PAD (peripheral artery disease) ([place])   . Sleep apnea    CPAP  . Tinnitus   . Tuberculosis    POSITIVE  TB SKIN TEST 1992.6 MTH TX .was exposed to someone who had it.      Objective/Physical Exam Neurovascular status intact.  Skin incisions appear to be well coapted with sutures and staples intact. No sign of infectious process noted. No dehiscence. No active bleeding noted. Moderate edema noted to the surgical extremity.  Radiographic Exam:  Normal osseous mineralization. Joint spaces preserved. No fracture/dislocation/boney destruction.  Absence of the 5th toe noted at the level of the MPJ.   Assessment: 1. s/p right 5th toe amputation. DOS: 03/04/18   Plan of Care:  1. Patient was evaluated. X-rays reviewed 2. Dressing changed.  3. Continue minimal weightbearing in post op shoe.  4. Prescription for Cipro 500 mg BID for two weeks based on cultures provided to patient.  5. Return to clinic in one week.    Edrick Kins, DPM [facility]  Dr. Edrick Kins, [address]                                        [place], [place] [address]                Office [phone]  Fax [phone]

## 2018-03-15 DIAGNOSIS — M5441 Lumbago with sciatica, right side: Secondary | ICD-10-CM | POA: Diagnosis not present

## 2018-03-15 DIAGNOSIS — M5442 Lumbago with sciatica, left side: Secondary | ICD-10-CM | POA: Diagnosis not present

## 2018-03-15 DIAGNOSIS — M531 Cervicobrachial syndrome: Secondary | ICD-10-CM | POA: Diagnosis not present

## 2018-03-17 ENCOUNTER — Encounter: Payer: 59 | Admitting: Family Medicine

## 2018-03-18 ENCOUNTER — Ambulatory Visit (INDEPENDENT_AMBULATORY_CARE_PROVIDER_SITE_OTHER): Payer: 59 | Admitting: Podiatry

## 2018-03-18 ENCOUNTER — Encounter: Payer: Self-pay | Admitting: Podiatry

## 2018-03-18 DIAGNOSIS — Z9889 Other specified postprocedural states: Secondary | ICD-10-CM

## 2018-03-18 DIAGNOSIS — M869 Osteomyelitis, unspecified: Secondary | ICD-10-CM

## 2018-03-18 NOTE — Progress Notes (Signed)
   Subjective:  Patient presents today status post right 5th toe amputation. DOS: 03/04/18. He states he is doing better. He reports some intermittent pain to the lateral aspect of the foot that is caused by walking. He has been weightbearing in the post op shoe as directed. He is currently taking Cipro. Patient is here for further evaluation and treatment.    Past Medical History:  Diagnosis Date  . Cancer ([place])    SKIN   . Depression   . Diabetes mellitus without complication ([place])    Type II  . Dyspnea    with exertion   . Fatigue   . Hyperlipidemia   . Hypertension   . Hypogonadism male   . Neuromuscular disorder ([place])    "back nerve stimulator"  . Neuropathy   . PAD (peripheral artery disease) ([place])   . Sleep apnea    CPAP  . Tinnitus   . Tuberculosis    POSITIVE  TB SKIN TEST 1992.6 MTH TX .was exposed to someone who had it.      Objective/Physical Exam Neurovascular status intact.  Skin incisions appear to be well coapted with sutures and staples intact. No sign of infectious process noted. No dehiscence. No active bleeding noted. Moderate edema noted to the surgical extremity.  Assessment: 1. s/p right 5th toe amputation. DOS: 03/04/18   Plan of Care:  1. Patient was evaluated.  2. Dressing changed.  3. Continue weightbearing in post op shoe.  4. Finish oral Cipro.  5. Return to clinic in two week for suture removal.    Edrick Kins, DPM [facility]  Dr. Edrick Kins, [address]                                        [place], [place] [address]                Office [phone]  Fax [phone]

## 2018-03-22 DIAGNOSIS — M542 Cervicalgia: Secondary | ICD-10-CM | POA: Diagnosis not present

## 2018-03-22 DIAGNOSIS — M961 Postlaminectomy syndrome, not elsewhere classified: Secondary | ICD-10-CM | POA: Diagnosis not present

## 2018-03-22 DIAGNOSIS — M545 Low back pain: Secondary | ICD-10-CM | POA: Diagnosis not present

## 2018-04-01 ENCOUNTER — Ambulatory Visit (INDEPENDENT_AMBULATORY_CARE_PROVIDER_SITE_OTHER): Payer: 59 | Admitting: Podiatry

## 2018-04-01 ENCOUNTER — Encounter: Payer: Self-pay | Admitting: Podiatry

## 2018-04-01 ENCOUNTER — Ambulatory Visit (INDEPENDENT_AMBULATORY_CARE_PROVIDER_SITE_OTHER): Payer: 59

## 2018-04-01 DIAGNOSIS — Z9889 Other specified postprocedural states: Secondary | ICD-10-CM

## 2018-04-01 DIAGNOSIS — Z89421 Acquired absence of other right toe(s): Secondary | ICD-10-CM | POA: Diagnosis not present

## 2018-04-02 NOTE — Progress Notes (Signed)
   Subjective:  Patient presents today status post right 5th toe amputation. DOS: 03/04/18. He states he is doing well overall. He reports some pain if he steps down a certain way. He has been applying Iodine daily with a bandage as directed. Patient is here for further evaluation and treatment.    Past Medical History:  Diagnosis Date  . Cancer ([place])    SKIN   . Depression   . Diabetes mellitus without complication ([place])    Type II  . Dyspnea    with exertion   . Fatigue   . Hyperlipidemia   . Hypertension   . Hypogonadism male   . Neuromuscular disorder ([place])    "back nerve stimulator"  . Neuropathy   . PAD (peripheral artery disease) ([place])   . Sleep apnea    CPAP  . Tinnitus   . Tuberculosis    POSITIVE  TB SKIN TEST 1992.6 MTH TX .was exposed to someone who had it.      Objective/Physical Exam Neurovascular status intact.  Skin incisions appear to be well coapted with sutures and staples intact. No sign of infectious process noted. No dehiscence. No active bleeding noted. Moderate edema noted to the surgical extremity.  Radiographic Exam:  Osteotomies sites appear to be stable with routine healing.   Assessment: 1. s/p right 5th toe amputation. DOS: 03/04/18   Plan of Care:  1. Patient was evaluated. X-Rays reviewed.  2. Sutures removed.  3. Continue using Iodine daily with a bandage.  4. Return to clinic in 2 weeks.     Edrick Kins, DPM [facility]  Dr. Edrick Kins, [address]                                        [place], [place] [address]                Office [phone]  Fax [phone]

## 2018-04-18 ENCOUNTER — Other Ambulatory Visit: Payer: Self-pay | Admitting: Family Medicine

## 2018-04-18 DIAGNOSIS — M542 Cervicalgia: Secondary | ICD-10-CM | POA: Diagnosis not present

## 2018-04-18 DIAGNOSIS — M961 Postlaminectomy syndrome, not elsewhere classified: Secondary | ICD-10-CM | POA: Diagnosis not present

## 2018-04-18 DIAGNOSIS — M545 Low back pain: Secondary | ICD-10-CM | POA: Diagnosis not present

## 2018-04-21 DIAGNOSIS — M542 Cervicalgia: Secondary | ICD-10-CM | POA: Diagnosis not present

## 2018-04-21 DIAGNOSIS — M5412 Radiculopathy, cervical region: Secondary | ICD-10-CM | POA: Diagnosis not present

## 2018-04-22 ENCOUNTER — Ambulatory Visit (INDEPENDENT_AMBULATORY_CARE_PROVIDER_SITE_OTHER): Payer: 59 | Admitting: Podiatry

## 2018-04-22 ENCOUNTER — Encounter: Payer: Self-pay | Admitting: Podiatry

## 2018-04-22 ENCOUNTER — Ambulatory Visit (INDEPENDENT_AMBULATORY_CARE_PROVIDER_SITE_OTHER): Payer: 59

## 2018-04-22 DIAGNOSIS — Z9889 Other specified postprocedural states: Secondary | ICD-10-CM | POA: Diagnosis not present

## 2018-04-22 DIAGNOSIS — Z89421 Acquired absence of other right toe(s): Secondary | ICD-10-CM

## 2018-04-24 DIAGNOSIS — Z23 Encounter for immunization: Secondary | ICD-10-CM | POA: Diagnosis not present

## 2018-04-25 NOTE — Progress Notes (Signed)
   Subjective:  Patient presents today status post right 5th toe amputation. DOS: 03/04/18. He states he is doing well. He denies any significant pain or modifying factors. Patient is here for further evaluation and treatment.    Past Medical History:  Diagnosis Date  . Cancer ([place])    SKIN   . Depression   . Diabetes mellitus without complication ([place])    Type II  . Dyspnea    with exertion   . Fatigue   . Hyperlipidemia   . Hypertension   . Hypogonadism male   . Neuromuscular disorder ([place])    "back nerve stimulator"  . Neuropathy   . PAD (peripheral artery disease) ([place])   . Sleep apnea    CPAP  . Tinnitus   . Tuberculosis    POSITIVE  TB SKIN TEST 1992.6 MTH TX .was exposed to someone who had it.      Objective/Physical Exam Neurovascular status intact.  Skin incisions appear to be well coapted. No sign of infectious process noted. No dehiscence. No active bleeding noted. Moderate edema noted to the surgical extremity.  Radiographic Exam:  Osteotomies sites appear to be stable with routine healing. Absence of 5th toe noted.    Assessment: 1. s/p right 5th toe amputation. DOS: 03/04/18 - healed    Plan of Care:  1. Patient was evaluated. X-Rays reviewed.  2. Recommended good shoe gear.  3. May resume full duty with no restrictions.  4. Return to clinic as needed.     Edrick Kins, DPM [facility]  Dr. Edrick Kins, [address]                                        [place], [place] [address]                Office [phone]  Fax [phone]

## 2018-05-05 NOTE — Progress Notes (Signed)
DOS: 03/04/2018 Amputation RT 5th toe; Incision and drainage RT   [place]

## 2018-05-10 ENCOUNTER — Ambulatory Visit: Payer: 59 | Admitting: Family Medicine

## 2018-05-10 ENCOUNTER — Encounter: Payer: Self-pay | Admitting: Family Medicine

## 2018-05-10 VITALS — BP 132/70 | HR 64 | Temp 97.7°F | Resp 16 | Wt 214.0 lb

## 2018-05-10 DIAGNOSIS — E1142 Type 2 diabetes mellitus with diabetic polyneuropathy: Secondary | ICD-10-CM | POA: Diagnosis not present

## 2018-05-10 DIAGNOSIS — E291 Testicular hypofunction: Secondary | ICD-10-CM | POA: Diagnosis not present

## 2018-05-10 DIAGNOSIS — M5126 Other intervertebral disc displacement, lumbar region: Secondary | ICD-10-CM | POA: Diagnosis not present

## 2018-05-10 DIAGNOSIS — G894 Chronic pain syndrome: Secondary | ICD-10-CM

## 2018-05-10 DIAGNOSIS — I10 Essential (primary) hypertension: Secondary | ICD-10-CM | POA: Diagnosis not present

## 2018-05-10 DIAGNOSIS — F320 Major depressive disorder, single episode, mild: Secondary | ICD-10-CM

## 2018-05-10 DIAGNOSIS — N289 Disorder of kidney and ureter, unspecified: Secondary | ICD-10-CM

## 2018-05-10 NOTE — Progress Notes (Signed)
Patient: Jeff [name] Male    DOB: 10-16-1960   57 y.o.   MRN: [id number] Visit Date: 05/10/2018  Today's Provider: [name], MD   Chief Complaint  Patient presents with  . Diabetes  . Hypertension   Subjective:    HPI  Diabetes Mellitus Type II, Follow-up:   Lab Results  Component Value Date   HGBA1C 5.7 (H) 03/04/2018   HGBA1C 6.3 12/07/2017   HGBA1C 7.9 (H) 08/11/2017    Last seen for diabetes 4 months ago.  Management since then includes no changes. He reports good compliance with treatment. He is not having side effects.  Current symptoms include none and have been stable. Home blood sugar records: trend: stable  Episodes of hypoglycemia? no   Current Insulin Regimen: none Most Recent Eye Exam: due Weight trend: stable Prior visit with dietician: no Current diet: well balanced Current exercise: no regular exercise  Pertinent Labs:    Component Value Date/Time   CHOL 121 05/05/2017 0800   CHOL CANCELED 02/19/2016 0813   TRIG 220 (H) 05/05/2017 0800   HDL 20 (L) 05/05/2017 0800   HDL CANCELED 02/19/2016 0813   LDLCALC 69 05/05/2017 0800   CREATININE 1.44 (H) 03/04/2018 1259   CREATININE 0.91 05/05/2017 0800    Wt Readings from Last 3 Encounters:  05/10/18 214 lb (97.1 kg)  03/04/18 210 lb (95.3 kg)  03/02/18 210 lb (95.3 kg)      Hypertension, follow-up:  BP Readings from Last 3 Encounters:  05/10/18 132/70  03/11/18 106/76  03/04/18 (!) 169/77    He was last seen for hypertension 4 months ago.  BP at that visit was 106/76. Management since that visit includes no changes. He reports good compliance with treatment. He is not having side effects.  He is not exercising. He is adherent to low salt diet.   Outside blood pressures are checked occasionally. He is experiencing none.    Allergies  Allergen Reactions  . Topamax [Topiramate] Other (See Comments)    unresponsive  Episodes ? SYNCOPE ?  . Keppra  [Levetiracetam] Palpitations and Other (See Comments)    "jittery", and "loopy." per pt.  . Neurontin [Gabapentin] Other (See Comments)    TREMORS "jittery" and "loopy" per pt.  Marland Kitchen Cymbalta [Duloxetine Hcl] Other (See Comments)    "jittery" and "loopy" per pt     Current Outpatient Medications:  .  ARIPiprazole (ABILIFY) 5 MG tablet, TAKE 1 TABLET (5 MG TOTAL) BY MOUTH DAILY., Disp: 30 tablet, Rfl: 11 .  aspirin 81 MG tablet, Take 81 mg by mouth daily., Disp: , Rfl:  .  atorvastatin (LIPITOR) 10 MG tablet, TAKE 1 TABLET BY MOUTH AT BEDTIME, Disp: 90 tablet, Rfl: 3 .  baclofen (LIORESAL) 10 MG tablet, TAKE 1 TABLET BY MOUTH 3 TIMES A DAY AS NEEDED FOR SPASMS, Disp: , Rfl:  .  empagliflozin (JARDIANCE) 25 MG TABS tablet, Take 25 mg by mouth daily., Disp: 30 tablet, Rfl: 12 .  hydrochlorothiazide (HYDRODIURIL) 25 MG tablet, TAKE 1 TABLET BY MOUTH EVERY DAY. *INSURANCE ONLY COVERS 30 DAYS**, Disp: 30 tablet, Rfl: 12 .  hydrocortisone 2.5 % cream, Apply topically., Disp: , Rfl:  .  losartan (COZAAR) 100 MG tablet, TAKE 1 TABLET BY MOUTH EVERY DAY, Disp: 90 tablet, Rfl: 3 .  LYRICA 100 MG capsule, LIMIT 1 CAPSULE BY MOUTH 3 - 5 TIMES PER DAY IF TOLERATED (NOTE THAT CAPSULE IS NOW 100 MG SIZE), Disp: , Rfl:  0 .  metFORMIN (GLUCOPHAGE) 1000 MG tablet, TAKE 1 TABLET BY MOUTH TWICE A DAY WITH A MEAL, Disp: 60 tablet, Rfl: 11 .  metoprolol succinate (TOPROL-XL) 50 MG 24 hr tablet, TAKE 1 TABLET (50 MG TOTAL) BY MOUTH DAILY., Disp: 30 tablet, Rfl: 11 .  orphenadrine (NORFLEX) 100 MG tablet, Take 100 mg by mouth 2 (two) times daily., Disp: , Rfl:  .  Oxycodone HCl 10 MG TABS, Take 1 tablet (10 mg total) by mouth every 4 (four) hours as needed., Disp: 100 tablet, Rfl: 0 .  oxyCODONE-acetaminophen (PERCOCET/ROXICET) 5-325 MG tablet, Take 1 tablet by mouth every 6 (six) hours as needed. for pain, Disp: 30 tablet, Rfl: 0 .  tadalafil (CIALIS) 5 MG tablet, , Disp: , Rfl:  .  venlafaxine (EFFEXOR) 75 MG  tablet, TAKE 3 TABLETS (225 MG TOTAL) BY MOUTH DAILY. (Patient taking differently: Take 75 mg by mouth 3 (three) times daily with meals. ), Disp: 90 tablet, Rfl: 11 .  zolpidem (AMBIEN) 10 MG tablet, Take 10 mg by mouth at bedtime as needed., Disp: , Rfl: 5 .  ONETOUCH VERIO test strip, CHECK SUGAR ONCE DAILY DX E11.9 (Patient not taking: Reported on 03/02/2018), Disp: 50 each, Rfl: 15 .  sulfamethoxazole-trimethoprim (BACTRIM DS,SEPTRA DS) 800-160 MG tablet, Take 1 tablet by mouth 2 (two) times daily. (Patient not taking: Reported on 05/10/2018), Disp: 28 tablet, Rfl: 1  Review of Systems  Constitutional: Negative.   Respiratory: Negative.   Cardiovascular: Negative.   Endocrine: Negative.   Musculoskeletal: Positive for arthralgias.  Allergic/Immunologic: Negative.   Neurological: Negative.   Psychiatric/Behavioral: Negative.        Pt off of Ambien for several months.    Social History   Tobacco Use  . Smoking status: Former Smoker    Packs/day: 1.50    Years: 30.00    Pack years: 45.00    Types: Cigarettes    Last attempt to quit: 2010    Years since quitting: 9.7  . Smokeless tobacco: Former Systems developer    Quit date: 12/16/1995  Substance Use Topics  . Alcohol use: No    Alcohol/week: 1.0 standard drinks    Types: 1 Standard drinks or equivalent per week    Comment: Beer   Objective:   BP 132/70 (BP Location: Left Arm, Patient Position: Sitting, Cuff Size: Large)   Pulse 64   Temp 97.7 F (36.5 C)   Resp 16   Wt 214 lb (97.1 kg)   SpO2 96%   BMI 32.54 kg/m  Vitals:   05/10/18 0837  BP: 132/70  Pulse: 64  Resp: 16  Temp: 97.7 F (36.5 C)  SpO2: 96%  Weight: 214 lb (97.1 kg)     Physical Exam  Constitutional: He is oriented to person, place, and time. He appears well-developed and well-nourished.  HENT:  Head: Normocephalic and atraumatic.  Right Ear: External ear normal.  Left Ear: External ear normal.  Nose: Nose normal.  Eyes: Conjunctivae are normal. No  scleral icterus.  Neck: No thyromegaly present.  Cardiovascular: Normal rate, regular rhythm and normal heart sounds.  Pulmonary/Chest: Effort normal and breath sounds normal.  Abdominal: Soft.  Musculoskeletal: He exhibits no edema.  Neurological: He is alert and oriented to person, place, and time.  Skin: Skin is warm and dry.  Psychiatric: He has a normal mood and affect. His behavior is normal. Judgment and thought content normal.        Assessment & Plan:     1.  Essential (primary) hypertension   2. Type 2 diabetes mellitus with diabetic polyneuropathy, without long-term current use of insulin (HCC) RTC 2-3 months.  3. Eunuchoidism   4. Displacement of lumbar intervertebral disc without myelopathy   5. Mild major depression (HCC) Off of Ambien. 6. Chronic pain associated with significant psychosocial dysfunction   7. Abnormal kidney function       I have done the exam and reviewed the above chart and it is accurate to the best of my knowledge. Development worker, community has been used in this note in any air is in the dictation or transcription are unintentional.  [name], MD  [facility]

## 2018-05-16 DIAGNOSIS — M542 Cervicalgia: Secondary | ICD-10-CM | POA: Diagnosis not present

## 2018-05-16 DIAGNOSIS — M545 Low back pain: Secondary | ICD-10-CM | POA: Diagnosis not present

## 2018-05-16 DIAGNOSIS — M961 Postlaminectomy syndrome, not elsewhere classified: Secondary | ICD-10-CM | POA: Diagnosis not present

## 2018-05-20 ENCOUNTER — Other Ambulatory Visit: Payer: Self-pay | Admitting: Family Medicine

## 2018-05-31 DIAGNOSIS — Z1283 Encounter for screening for malignant neoplasm of skin: Secondary | ICD-10-CM | POA: Diagnosis not present

## 2018-05-31 DIAGNOSIS — Z85828 Personal history of other malignant neoplasm of skin: Secondary | ICD-10-CM | POA: Diagnosis not present

## 2018-05-31 DIAGNOSIS — L570 Actinic keratosis: Secondary | ICD-10-CM | POA: Diagnosis not present

## 2018-05-31 DIAGNOSIS — D485 Neoplasm of uncertain behavior of skin: Secondary | ICD-10-CM | POA: Diagnosis not present

## 2018-06-02 DIAGNOSIS — M542 Cervicalgia: Secondary | ICD-10-CM | POA: Diagnosis not present

## 2018-06-02 DIAGNOSIS — M961 Postlaminectomy syndrome, not elsewhere classified: Secondary | ICD-10-CM | POA: Diagnosis not present

## 2018-06-02 DIAGNOSIS — M545 Low back pain: Secondary | ICD-10-CM | POA: Diagnosis not present

## 2018-07-11 DIAGNOSIS — M545 Low back pain: Secondary | ICD-10-CM | POA: Diagnosis not present

## 2018-07-11 DIAGNOSIS — M961 Postlaminectomy syndrome, not elsewhere classified: Secondary | ICD-10-CM | POA: Diagnosis not present

## 2018-07-11 DIAGNOSIS — M542 Cervicalgia: Secondary | ICD-10-CM | POA: Diagnosis not present

## 2018-07-28 DIAGNOSIS — E119 Type 2 diabetes mellitus without complications: Secondary | ICD-10-CM | POA: Diagnosis not present

## 2018-07-28 LAB — HM DIABETES EYE EXAM

## 2018-08-02 ENCOUNTER — Ambulatory Visit: Payer: 59 | Admitting: Podiatry

## 2018-08-02 ENCOUNTER — Encounter: Payer: Self-pay | Admitting: Podiatry

## 2018-08-02 DIAGNOSIS — I70235 Atherosclerosis of native arteries of right leg with ulceration of other part of foot: Secondary | ICD-10-CM | POA: Diagnosis not present

## 2018-08-02 DIAGNOSIS — E0843 Diabetes mellitus due to underlying condition with diabetic autonomic (poly)neuropathy: Secondary | ICD-10-CM

## 2018-08-02 DIAGNOSIS — L97522 Non-pressure chronic ulcer of other part of left foot with fat layer exposed: Secondary | ICD-10-CM

## 2018-08-02 MED ORDER — DOXYCYCLINE HYCLATE 100 MG PO TABS: 100.0000 mg | ORAL_TABLET | Freq: Two times a day (BID) | ORAL | 0 refills | Status: DC

## 2018-08-02 MED ORDER — GENTAMICIN SULFATE 0.1 % EX CREA: 1.0000 "application " | TOPICAL_CREAM | Freq: Two times a day (BID) | CUTANEOUS | 1 refills | Status: DC

## 2018-08-08 DIAGNOSIS — M545 Low back pain: Secondary | ICD-10-CM | POA: Diagnosis not present

## 2018-08-08 DIAGNOSIS — M542 Cervicalgia: Secondary | ICD-10-CM | POA: Diagnosis not present

## 2018-08-08 DIAGNOSIS — M961 Postlaminectomy syndrome, not elsewhere classified: Secondary | ICD-10-CM | POA: Diagnosis not present

## 2018-08-09 NOTE — Progress Notes (Signed)
   HPI: 58 year old male history of diabetes mellitus and prior toe amputations presents to the office today for evaluation of new ulcers that have developed to the patient's bilateral feet.  Patient states that his diabetes is well controlled with the last hemoglobin A1c level of 6.0 mg/dL in October 2019.  Patient states that over the last 3 days he started to develop a red painful toenails to the first and second digits of the left foot as well as the right great toe.  He denies fever chills nausea vomiting.  He presents today for further treatment evaluation  Past Medical History:  Diagnosis Date  . Cancer ([place])    SKIN   . Depression   . Diabetes mellitus without complication ([place])    Type II  . Dyspnea    with exertion   . Fatigue   . Hyperlipidemia   . Hypertension   . Hypogonadism male   . Neuromuscular disorder ([place])    "back nerve stimulator"  . Neuropathy   . PAD (peripheral artery disease) ([place])   . Sleep apnea    CPAP  . Tinnitus   . Tuberculosis    POSITIVE  TB SKIN TEST 1992.6 MTH TX .was exposed to someone who had it.     Physical Exam: General: The patient is alert and oriented x3 in no acute distress.  Dermatology: Ulcer noted to the left first and second digit nail plates both measuring approximately 0.6 x 0.5 x 0.1 cm. Ulcer also noted to the right hallux measuring approximately 1.0 x 0.6 x 0.1 cm. To the noted ulcerations there is no eschar.  There is a moderate amount of slough fibrin necrotic tissue noted.  No exposed bone.  No malodor noted.  Moderate serosanguineous drainage noted.  Periwound integrity is intact.  Vascular: Palpable pedal pulses bilaterally.  Mild erythema surrounding the toes consistent with a localized cellulitis. Capillary refill within normal limits.  Neurological: Epicritic and protective threshold absent bilaterally.   Musculoskeletal Exam: History of right fifth toe amputation.   Assessment: 1.  Ulcer bilateral digits secondary  to diabetes mellitus   Plan of Care:  1. Patient evaluated.   2.  Medically necessary excisional debridement including subcutaneous tissue was performed using a tissue nipper.  Excisional debridement of all necrotic nonviable tissue down to healthy bleeding viable tissue was performed with post debridement measurement same as pre- 3.  Prescription for doxycycline 100 mg #20 twice daily 4.  Prescription for gentamicin cream to be applied daily with a dry sterile dressing 5.  Return to clinic in 3 weeks      Edrick Kins, DPM [facility]  Dr. Edrick Kins, DPM    [address], [place] [address]                Office [phone]  Fax [phone]

## 2018-08-11 ENCOUNTER — Ambulatory Visit: Payer: 59 | Admitting: Family Medicine

## 2018-08-11 ENCOUNTER — Telehealth: Payer: Self-pay | Admitting: Family Medicine

## 2018-08-11 VITALS — BP 130/74 | HR 68 | Temp 97.7°F | Resp 16 | Wt 216.0 lb

## 2018-08-11 DIAGNOSIS — E785 Hyperlipidemia, unspecified: Secondary | ICD-10-CM | POA: Diagnosis not present

## 2018-08-11 DIAGNOSIS — R5382 Chronic fatigue, unspecified: Secondary | ICD-10-CM

## 2018-08-11 DIAGNOSIS — M5136 Other intervertebral disc degeneration, lumbar region: Secondary | ICD-10-CM

## 2018-08-11 DIAGNOSIS — E1142 Type 2 diabetes mellitus with diabetic polyneuropathy: Secondary | ICD-10-CM | POA: Diagnosis not present

## 2018-08-11 DIAGNOSIS — G629 Polyneuropathy, unspecified: Secondary | ICD-10-CM

## 2018-08-11 DIAGNOSIS — I10 Essential (primary) hypertension: Secondary | ICD-10-CM

## 2018-08-11 DIAGNOSIS — M5030 Other cervical disc degeneration, unspecified cervical region: Secondary | ICD-10-CM

## 2018-08-11 DIAGNOSIS — N529 Male erectile dysfunction, unspecified: Secondary | ICD-10-CM | POA: Diagnosis not present

## 2018-08-11 MED ORDER — SILDENAFIL CITRATE 20 MG PO TABS: ORAL_TABLET | ORAL | 11 refills | Status: DC

## 2018-08-11 NOTE — Telephone Encounter (Signed)
Patient called back stating he had a runny nose all the time and wanted something for this.    [facility]

## 2018-08-11 NOTE — Progress Notes (Signed)
Jeff [name]  MRN: [id number] DOB: 01/20/1961  Subjective:  HPI   Patient is a 58 year old male who presents for follow up of his diabetes.  He was last seen on 05/10/18 and his last A1C was done on 03/04/18 and it was 5.7.  Patient reports that his glucose levels at home have been running good with the highest being 160.  He has not had any episodes of hypoglycemia.  The patient is also due for his other routine labs.    Patient Active Problem List   Diagnosis Date Noted  . DDD (degenerative disc disease), cervical 02/25/2016  . H/O cervical spine surgery 02/25/2016  . Cervical facet syndrome 02/25/2016  . DDD (degenerative disc disease), lumbar 02/25/2016  . Facet syndrome, lumbar 02/25/2016  . Sacroiliac joint dysfunction 02/25/2016  . Atherosclerotic peripheral vascular disease ([place]) 02/25/2016  . Abnormal kidney function 12/06/2014  . Cervical nerve root disorder 12/06/2014  . Colon polyp 12/06/2014  . Clinical depression 12/06/2014  . Essential (primary) hypertension 12/06/2014  . Cephalalgia 12/06/2014  . Bulge of cervical disc without myelopathy 12/06/2014  . HLD (hyperlipidemia) 12/06/2014  . 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 Displacement of lumbar intervertebral disc without myelopathy 12/06/2014  . L-S radiculopathy 12/06/2014  . Mild major depression ([place]) 12/06/2014  . Neuropathy 12/06/2014  . Adiposity 12/06/2014  . Peripheral vascular disease ([place]) 12/06/2014  . Diabetes mellitus, type 2 ([place]) 12/06/2014  . Cervical post-laminectomy syndrome 10/04/2013  . Chronic neck pain 10/04/2013  . Peripheral neuropathic pain 10/04/2013  . Fatigue 03/17/2013  . Chest pain 01/28/2013  . Seizures ([place]) 01/28/2013  . Syncope 01/28/2013  . Chronic pain associated with significant psychosocial dysfunction 08/17/2012  . Polypharmacy 08/02/2012  . 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 Post laminectomy syndrome 08/02/2012  . Thoracic spinal stenosis 06/22/2011    Past Medical  History:  Diagnosis Date  . Cancer ([place])    SKIN   . Depression   . Diabetes mellitus without complication ([place])    Type II  . Dyspnea    with exertion   . Fatigue   . Hyperlipidemia   . Hypertension   . Hypogonadism male   . Neuromuscular disorder ([place])    "back nerve stimulator"  . Neuropathy   . PAD (peripheral artery disease) ([place])   . Sleep apnea    CPAP  . Tinnitus   . Tuberculosis    POSITIVE  TB SKIN TEST 1992.6 MTH TX .was exposed to someone who had it.    Social History   Socioeconomic History  . Marital status: Widowed    Spouse name: Not on file  . Number of children: 3  . Years of education: 54  . Highest education level: Not on file  Occupational History  . Occupation: works at [employer]  . Occupation: Software engineer: [employer]  . Financial resource strain: Not on file  . Food insecurity:    Worry: Not on file    Inability: Not on file  . Transportation needs:    Medical: Not on file    Non-medical: Not on file  Tobacco Use  . Smoking status: Former Smoker    Packs/day: 1.50    Years: 30.00    Pack years: 45.00    Types: Cigarettes    Last attempt to quit: 2010    Years since quitting: 10.0  . Smokeless tobacco: Former Systems developer    Quit date: 12/16/1995  Substance and  Sexual Activity  . Alcohol use: No    Alcohol/week: 1.0 standard drinks    Types: 1 Standard drinks or equivalent per week    Comment: Beer  . Drug use: No  . Sexual activity: Yes    Birth control/protection: None  Lifestyle  . Physical activity:    Days per week: Not on file    Minutes per session: Not on file  . Stress: Not on file  Relationships  . Social connections:    Talks on phone: Not on file    Gets together: Not on file    Attends religious service: Not on file    Active member of club or organization: Not on file    Attends meetings of clubs or organizations: Not on file    Relationship status: Not on file  .  Intimate partner violence:    Fear of current or ex partner: Not on file    Emotionally abused: Not on file    Physically abused: Not on file    Forced sexual activity: Not on file  Other Topics Concern  . Not on file  Social History Narrative   Lives at home alone, widowed   Right-handed   Caffeine: tea and soft drinks    Outpatient Encounter Medications as of 08/11/2018  Medication Sig  . ARIPiprazole (ABILIFY) 5 MG tablet TAKE 1 TABLET (5 MG TOTAL) BY MOUTH DAILY.  Marland Kitchen aspirin 81 MG tablet Take 81 mg by mouth daily.  Marland Kitchen atorvastatin (LIPITOR) 10 MG tablet TAKE 1 TABLET BY MOUTH AT BEDTIME  . baclofen (LIORESAL) 10 MG tablet TAKE 1 TABLET BY MOUTH 3 TIMES A DAY AS NEEDED FOR SPASMS  . gentamicin cream (GARAMYCIN) 0.1 % Apply 1 application topically 2 (two) times daily.  . hydrochlorothiazide (HYDRODIURIL) 25 MG tablet TAKE 1 TABLET BY MOUTH EVERY DAY. *INSURANCE ONLY COVERS 30 DAYS**  . hydrocortisone 2.5 % cream Apply topically.  Marland Kitchen JARDIANCE 25 MG TABS tablet TAKE 1 TABLET BY MOUTH DAILY  . losartan (COZAAR) 100 MG tablet TAKE 1 TABLET BY MOUTH EVERY DAY  . LYRICA 100 MG capsule LIMIT 1 CAPSULE BY MOUTH 3 - 5 TIMES PER DAY IF TOLERATED (NOTE THAT CAPSULE IS NOW 100 MG SIZE)  . metFORMIN (GLUCOPHAGE) 1000 MG tablet TAKE 1 TABLET BY MOUTH TWICE A DAY WITH A MEAL  . metoprolol succinate (TOPROL-XL) 50 MG 24 hr tablet TAKE 1 TABLET (50 MG TOTAL) BY MOUTH DAILY.  Marland Kitchen ONETOUCH VERIO test strip CHECK SUGAR ONCE DAILY DX E11.9  . orphenadrine (NORFLEX) 100 MG tablet Take 100 mg by mouth 2 (two) times daily.  . Oxycodone HCl 10 MG TABS Take 1 tablet (10 mg total) by mouth every 4 (four) hours as needed.  Marland Kitchen oxyCODONE-acetaminophen (PERCOCET/ROXICET) 5-325 MG tablet Take 1 tablet by mouth every 6 (six) hours as needed. for pain (Patient not taking: Reported on 08/11/2018)  . venlafaxine (EFFEXOR) 75 MG tablet TAKE 3 TABLETS (225 MG TOTAL) BY MOUTH DAILY. (Patient taking differently: Take 75 mg by mouth  3 (three) times daily with meals. )  . [DISCONTINUED] doxycycline (VIBRA-TABS) 100 MG tablet Take 1 tablet (100 mg total) by mouth 2 (two) times daily.  . [DISCONTINUED] sulfamethoxazole-trimethoprim (BACTRIM DS,SEPTRA DS) 800-160 MG tablet Take 1 tablet by mouth 2 (two) times daily. (Patient not taking: Reported on 05/10/2018)  . [DISCONTINUED] tadalafil (CIALIS) 5 MG tablet   . [DISCONTINUED] valsartan-hydrochlorothiazide (DIOVAN-HCT) 160-25 MG tablet Take by mouth.  . [DISCONTINUED] zolpidem (AMBIEN) 10 MG tablet  Take 10 mg by mouth at bedtime as needed.   No facility-administered encounter medications on file as of 08/11/2018.     Allergies  Allergen Reactions  . Topamax [Topiramate] Other (See Comments)    unresponsive  Episodes ? SYNCOPE ?  . Keppra [Levetiracetam] Palpitations and Other (See Comments)    "jittery", and "loopy." per pt.  . Neurontin [Gabapentin] Other (See Comments)    TREMORS "jittery" and "loopy" per pt.  Marland Kitchen Cymbalta [Duloxetine Hcl] Other (See Comments)    "jittery" and "loopy" per pt    Review of Systems  Constitutional: Negative for fever and malaise/fatigue.  Eyes: Negative.   Respiratory: Negative for cough, shortness of breath and wheezing.   Cardiovascular: Negative for chest pain, palpitations, orthopnea, claudication and leg swelling.  Gastrointestinal: Negative.   Genitourinary:       Widowed man now having ED problems in first relationship since the death of his wife.  Musculoskeletal: Positive for back pain and joint pain.  Skin: Negative.   Endo/Heme/Allergies: Negative.   Psychiatric/Behavioral: Negative.     Objective:  BP 130/74 (BP Location: Right Arm, Patient Position: Sitting, Cuff Size: Normal)   Pulse 68   Temp 97.7 F (36.5 C) (Oral)   Resp 16   Wt 216 lb (98 kg)   SpO2 98%   BMI 32.84 kg/m   Physical Exam  Constitutional: He is oriented to person, place, and time and well-developed, well-nourished, and in no distress.    HENT:  Head: Normocephalic and atraumatic.  Right Ear: External ear normal.  Left Ear: External ear normal.  Nose: Nose normal.  Eyes: Conjunctivae are normal. No scleral icterus.  Neck: No thyromegaly present.  Cardiovascular: Normal rate, regular rhythm and normal heart sounds.  Pulmonary/Chest: Effort normal and breath sounds normal.  Abdominal: Soft.  Neurological: He is alert and oriented to person, place, and time.  Skin: Skin is warm and dry.  Psychiatric: Mood, memory, affect and judgment normal.    Assessment and Plan :   1. Type 2 diabetes mellitus with diabetic polyneuropathy, without long-term current use of insulin (HCC)  - Hemoglobin A1c - Lipid Panel With LDL/HDL Ratio  2. Essential (primary) hypertension  - CBC with Differential/Platelet - Comprehensive metabolic panel - TSH  3. Hyperlipidemia, unspecified hyperlipidemia type Discussed risk and benefits at length.  Discussed urology referral if this does not help. - Lipid Panel With LDL/HDL Ratio  4. Erectile dysfunction, unspecified erectile dysfunction type  - sildenafil (REVATIO) 20 MG tablet; 1-5 daily as needed  Dispense: 25 tablet; Refill: 11 - Ambulatory referral to Urology  5. Neuropathy   6. Chronic fatigue Multifactorial.  7. DDD (degenerative disc disease), cervical   8. DDD (degenerative disc disease), lumbar Per Pain clinic.    HPI, Exam and A&P Transcribed under the direction and in the presence of Miguel Aschoff, Brooke Bonito., MD. Electronically Signed: Althea Charon, RMA I have done the exam and reviewed the chart and it is accurate to the best of my knowledge. Development worker, community has been used and  any errors in dictation or transcription are unintentional. Miguel Aschoff M.D. [facility]

## 2018-08-11 NOTE — Telephone Encounter (Signed)
Mailbox is full. Will try again

## 2018-08-11 NOTE — Telephone Encounter (Signed)
Try Loaratadine 10mg  daily prn.

## 2018-08-11 NOTE — Telephone Encounter (Signed)
Do you know of anything we can tell him

## 2018-08-12 NOTE — Telephone Encounter (Signed)
Patient advised as below.  

## 2018-08-18 ENCOUNTER — Other Ambulatory Visit: Payer: Self-pay | Admitting: Family Medicine

## 2018-08-18 DIAGNOSIS — E785 Hyperlipidemia, unspecified: Secondary | ICD-10-CM | POA: Diagnosis not present

## 2018-08-18 DIAGNOSIS — R0602 Shortness of breath: Secondary | ICD-10-CM

## 2018-08-18 DIAGNOSIS — E1142 Type 2 diabetes mellitus with diabetic polyneuropathy: Secondary | ICD-10-CM | POA: Diagnosis not present

## 2018-08-18 DIAGNOSIS — I10 Essential (primary) hypertension: Secondary | ICD-10-CM | POA: Diagnosis not present

## 2018-08-19 LAB — COMPREHENSIVE METABOLIC PANEL
ALBUMIN: 3.9 g/dL (ref 3.5–5.5)
ALT: 14 IU/L (ref 0–44)
AST: 15 IU/L (ref 0–40)
Albumin/Globulin Ratio: 1.1 — ABNORMAL LOW (ref 1.2–2.2)
Alkaline Phosphatase: 115 IU/L (ref 39–117)
BUN / CREAT RATIO: 16 (ref 9–20)
BUN: 19 mg/dL (ref 6–24)
Bilirubin Total: 0.4 mg/dL (ref 0.0–1.2)
CALCIUM: 9.3 mg/dL (ref 8.7–10.2)
CO2: 24 mmol/L (ref 20–29)
Chloride: 94 mmol/L — ABNORMAL LOW (ref 96–106)
Creatinine, Ser: 1.19 mg/dL (ref 0.76–1.27)
GFR, EST AFRICAN AMERICAN: 77 mL/min/{1.73_m2} (ref >59)
GFR, EST NON AFRICAN AMERICAN: 66 mL/min/{1.73_m2} (ref >59)
GLUCOSE: 165 mg/dL — AB (ref 65–99)
Globulin, Total: 3.5 g/dL (ref 1.5–4.5)
Potassium: 4.5 mmol/L (ref 3.5–5.2)
Sodium: 133 mmol/L — ABNORMAL LOW (ref 134–144)
TOTAL PROTEIN: 7.4 g/dL (ref 6.0–8.5)

## 2018-08-19 LAB — CBC WITH DIFFERENTIAL/PLATELET
Basophils Absolute: 0.1 10*3/uL (ref 0.0–0.2)
Basos: 1 %
EOS (ABSOLUTE): 0.2 10*3/uL (ref 0.0–0.4)
Eos: 2 %
Hematocrit: 49.1 % (ref 37.5–51.0)
Hemoglobin: 17 g/dL (ref 13.0–17.7)
Immature Grans (Abs): 0 10*3/uL (ref 0.0–0.1)
Immature Granulocytes: 0 %
Lymphocytes Absolute: 2.7 10*3/uL (ref 0.7–3.1)
Lymphs: 33 %
MCH: 32.9 pg (ref 26.6–33.0)
MCHC: 34.6 g/dL (ref 31.5–35.7)
MCV: 95 fL (ref 79–97)
Monocytes Absolute: 0.6 10*3/uL (ref 0.1–0.9)
Monocytes: 7 %
Neutrophils Absolute: 4.5 10*3/uL (ref 1.4–7.0)
Neutrophils: 57 %
Platelets: 235 10*3/uL (ref 150–450)
RBC: 5.17 x10E6/uL (ref 4.14–5.80)
RDW: 12.5 % (ref 11.6–15.4)
WBC: 8.1 10*3/uL (ref 3.4–10.8)

## 2018-08-19 LAB — HEMOGLOBIN A1C
Est. average glucose Bld gHb Est-mCnc: 143 mg/dL
Hgb A1c MFr Bld: 6.6 % — ABNORMAL HIGH (ref 4.8–5.6)

## 2018-08-19 LAB — LIPID PANEL WITH LDL/HDL RATIO
Cholesterol, Total: 136 mg/dL (ref 100–199)
HDL: 24 mg/dL — AB (ref >39)
LDL Calculated: 64 mg/dL (ref 0–99)
LDl/HDL Ratio: 2.7 ratio (ref 0.0–3.6)
TRIGLYCERIDES: 242 mg/dL — AB (ref 0–149)
VLDL CHOLESTEROL CAL: 48 mg/dL — AB (ref 5–40)

## 2018-08-19 LAB — TSH: TSH: 2.51 u[IU]/mL (ref 0.450–4.500)

## 2018-08-23 ENCOUNTER — Encounter: Payer: Self-pay | Admitting: Podiatry

## 2018-08-23 ENCOUNTER — Ambulatory Visit: Payer: 59 | Admitting: Podiatry

## 2018-08-23 DIAGNOSIS — L97522 Non-pressure chronic ulcer of other part of left foot with fat layer exposed: Secondary | ICD-10-CM

## 2018-08-23 DIAGNOSIS — E0843 Diabetes mellitus due to underlying condition with diabetic autonomic (poly)neuropathy: Secondary | ICD-10-CM

## 2018-08-23 DIAGNOSIS — I70235 Atherosclerosis of native arteries of right leg with ulceration of other part of foot: Secondary | ICD-10-CM | POA: Diagnosis not present

## 2018-08-23 MED ORDER — GENTAMICIN SULFATE 0.1 % EX CREA: 1.0000 "application " | TOPICAL_CREAM | Freq: Two times a day (BID) | CUTANEOUS | 1 refills | Status: DC

## 2018-08-26 ENCOUNTER — Ambulatory Visit: Payer: 59 | Admitting: Podiatry

## 2018-08-29 ENCOUNTER — Telehealth: Payer: Self-pay

## 2018-08-29 NOTE — Telephone Encounter (Signed)
Patient advised as below.  

## 2018-08-29 NOTE — Progress Notes (Signed)
   HPI: 58 year old male history of diabetes mellitus and prior toe amputations presents to the office today for follow up evaluation of ulcerations to bilateral feet. He states he is doing a lot better since his previous visit. 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 Wearing shoes increases the pain. He has not done anything for treatment. Patient is here for further evaluation and treatment.   Past Medical History:  Diagnosis Date  . Cancer ([place])    SKIN   . Depression   . Diabetes mellitus without complication ([place])    Type II  . Dyspnea    with exertion   . Fatigue   . Hyperlipidemia   . Hypertension   . Hypogonadism male   . Neuromuscular disorder ([place])    "back nerve stimulator"  . Neuropathy   . PAD (peripheral artery disease) ([place])   . Sleep apnea    CPAP  . Tinnitus   . Tuberculosis    POSITIVE  TB SKIN TEST 1992.6 MTH TX .was exposed to someone who had it.     Physical Exam: General: The patient is alert and oriented x3 in no acute distress.  Dermatology: Wound noted to the toes bilaterally have healed. Complete re-epithelialization has occurred. No drainage noted.   Wound #1 noted to the left posterior heel measuring 1.0 x 1.0 x 0.3 cm.   To the above-noted ulceration, there is no eschar. There is a moderate amount of slough, fibrin and necrotic tissue. Granulation tissue and wound base is red. There is no malodor. There is a minimal amount of serosanginous drainage noted. Periwound integrity is intact.   Vascular: Palpable pedal pulses bilaterally. No erythema or edema. Capillary refill within normal limits.  Neurological: Epicritic and protective threshold absent bilaterally.   Musculoskeletal Exam: History of right fifth toe amputation.   Assessment: 1. Ulcer bilateral digits secondary to diabetes mellitus - healed 2. Ulceration of the left posterior heel secondary to  diabetes mellitus    Plan of Care:  1. Patient evaluated.   2. Medically necessary excisional debridement including subcutaneous tissue was performed using a tissue nipper and a chisel blade. Excisional debridement of all the necrotic nonviable tissue down to healthy bleeding viable tissue was performed with post-debridement measurements same as pre-. 3. The wound was cleansed and dry sterile dressing applied. 4. Continue using Gentamicin cream daily with a bandage.  5. Recommended good shoe gear.  6. Return to clinic in 4 weeks.      Edrick Kins, DPM [facility]  Dr. Edrick Kins, DPM    [address], [place] [address]                Office [phone]  Fax [phone]

## 2018-08-29 NOTE — Telephone Encounter (Signed)
-----   Message from Jerrol Banana., MD sent at 08/25/2018  1:40 PM EST ----- Labs stable.  Diabetes a little higher.  Continue to work on habits.

## 2018-09-05 DIAGNOSIS — M542 Cervicalgia: Secondary | ICD-10-CM | POA: Diagnosis not present

## 2018-09-05 DIAGNOSIS — M961 Postlaminectomy syndrome, not elsewhere classified: Secondary | ICD-10-CM | POA: Diagnosis not present

## 2018-09-05 DIAGNOSIS — M545 Low back pain: Secondary | ICD-10-CM | POA: Diagnosis not present

## 2018-09-05 DIAGNOSIS — G894 Chronic pain syndrome: Secondary | ICD-10-CM | POA: Diagnosis not present

## 2018-09-08 ENCOUNTER — Encounter: Payer: Self-pay | Admitting: Urology

## 2018-09-08 ENCOUNTER — Ambulatory Visit: Payer: 59 | Admitting: Urology

## 2018-09-08 VITALS — BP 128/76 | HR 68 | Ht 68.0 in | Wt 216.8 lb

## 2018-09-08 DIAGNOSIS — N529 Male erectile dysfunction, unspecified: Secondary | ICD-10-CM

## 2018-09-08 NOTE — Progress Notes (Signed)
09/08/2018 7:50 AM   Jeff [name] January 29, 1961 [id number]  Referring provider: Jerrol [name]., MD [address] [place], [place] [address]  Chief Complaint  Patient presents with  . Erectile Dysfunction    HPI: Jeff [name] is a 58 yo M who seen in consultation at request of Dr. Rosanna [name] for evaluation of erectile dysfunction -Wife passed away 2 years ago; noticed onset of minor ED problems  -6 months ago reengaged in sexual activity; noticed no erections -No partial erections -Tried sildenafil, not effective  -No pain or abnormal curvature erections  -No urinary symptoms, no prior urological history  -Organic risks factors: hypertension, hyperlipidemia, hypogonadism, peripheral arterial disease, degenerative disk disease, previous tobacco use, diabetes, antihypertensive medications  PMH: Past Medical History:  Diagnosis Date  . Cancer ([place])    SKIN   . Depression   . Diabetes mellitus without complication ([place])    Type II  . Dyspnea    with exertion   . Fatigue   . Hyperlipidemia   . Hypertension   . Hypogonadism male   . Neuromuscular disorder ([place])    "back nerve stimulator"  . Neuropathy   . PAD (peripheral artery disease) ([place])   . Sleep apnea    CPAP  . Tinnitus   . Tuberculosis    POSITIVE  TB SKIN TEST 1992.6 MTH TX .was exposed to someone who had it.    Surgical History: Past Surgical History:  Procedure Laterality Date  . AMPUTATION TOE Right 03/04/2018   Procedure: AMPUTATION TOE/MPJ JOINT FIFTH RIGHT;  Surgeon: Jeff [name], DPM;  Location: [facility];  Service: Podiatry;  Laterality: Right;  . BACK SURGERY  2012   neck was 2012,back same year. plate in [id number]  . CARDIAC CATHETERIZATION  01/31/2013   Medical management  . CARPAL TUNNEL RELEASE Bilateral   . CATARACT EXTRACTION Right 2014  . CATARACT EXTRACTION W/PHACO Left 03/12/2016   Procedure: CATARACT EXTRACTION PHACO AND INTRAOCULAR LENS PLACEMENT (IOC);  Surgeon:  Jeff Robson, MD;  Location: [facility];  Service: Ophthalmology;  Laterality: Left;  [place] 00:31AP% 17.9CDE 5.67Fluid pack lot # [id number]  . CERVICAL FUSION  2012  . CORONARY ANGIOPLASTY  2014   all good  . KNEE ARTHROSCOPY Left   . LEFT HEART CATHETERIZATION WITH CORONARY ANGIOGRAM N/A 01/31/2013   Procedure: LEFT HEART CATHETERIZATION WITH CORONARY ANGIOGRAM;  Surgeon: Jeff Breeding, MD;  Location: [facility] CATH LAB;  Service: Cardiovascular;  Laterality: N/A;  . LUMBAR LAMINECTOMY/DECOMPRESSION MICRODISCECTOMY  06/22/2011   Procedure: LUMBAR LAMINECTOMY/DECOMPRESSION MICRODISCECTOMY;  Surgeon: Jeff [name];  Location: [facility];  Service: Neurosurgery;  Laterality: N/A;  Thoracic Ten-Eleven,Thoracic Eleven-Twelve Laminectomy  . Pain stimulator    . ulnar N/A   . VASECTOMY  1991    Home Medications:  Allergies as of 09/08/2018      Reactions   Topamax [topiramate] Other (See Comments)   unresponsive  Episodes ? SYNCOPE ?   Keppra [levetiracetam] Palpitations, Other (See Comments)   "jittery", and "loopy." per pt.   Neurontin [gabapentin] Other (See Comments)   TREMORS "jittery" and "loopy" per pt.   Cymbalta [duloxetine Hcl] Other (See Comments)   "jittery" and "loopy" per pt      Medication List       Accurate as of September 08, 2018 11:59 PM. Always use your most recent med list.        ARIPiprazole 5 MG tablet Commonly known as:  ABILIFY TAKE 1 TABLET (5 MG TOTAL)  BY MOUTH DAILY.   aspirin 81 MG tablet Take 81 mg by mouth daily.   atorvastatin 10 MG tablet Commonly known as:  LIPITOR TAKE 1 TABLET BY MOUTH AT BEDTIME   baclofen 10 MG tablet Commonly known as:  LIORESAL TAKE 1 TABLET BY MOUTH 3 TIMES A DAY AS NEEDED FOR SPASMS   gentamicin cream 0.1 % Commonly known as:  GARAMYCIN Apply 1 application topically 2 (two) times daily.   hydrochlorothiazide 25 MG tablet Commonly known as:  HYDRODIURIL TAKE 1 TABLET BY MOUTH EVERY DAY. *INSURANCE ONLY COVERS 30  DAYS**   hydrocortisone 2.5 % cream Apply topically.   JARDIANCE 25 MG Tabs tablet Generic drug:  empagliflozin TAKE 1 TABLET BY MOUTH DAILY   losartan 100 MG tablet Commonly known as:  COZAAR TAKE 1 TABLET BY MOUTH EVERY DAY   LYRICA 100 MG capsule Generic drug:  pregabalin LIMIT 1 CAPSULE BY MOUTH 3 - 5 TIMES PER DAY IF TOLERATED (NOTE THAT CAPSULE IS NOW 100 MG SIZE)   metFORMIN 1000 MG tablet Commonly known as:  GLUCOPHAGE TAKE 1 TABLET BY MOUTH TWICE A DAY WITH A MEAL   metoprolol succinate 50 MG 24 hr tablet Commonly known as:  TOPROL-XL TAKE 1 TABLET (50 MG TOTAL) BY MOUTH DAILY.   ONETOUCH VERIO test strip Generic drug:  glucose blood CHECK SUGAR ONCE DAILY DX E11.9   orphenadrine 100 MG tablet Commonly known as:  NORFLEX Take 100 mg by mouth 2 (two) times daily.   Oxycodone HCl 10 MG Tabs Take 1 tablet (10 mg total) by mouth every 4 (four) hours as needed.   oxyCODONE-acetaminophen 5-325 MG tablet Commonly known as:  PERCOCET/ROXICET Take 1 tablet by mouth every 6 (six) hours as needed. for pain   sildenafil 20 MG tablet Commonly known as:  REVATIO 1-5 daily as needed   venlafaxine 75 MG tablet Commonly known as:  EFFEXOR TAKE 3 TABLETS (225 MG TOTAL) BY MOUTH DAILY.       Allergies:  Allergies  Allergen Reactions  . Topamax [Topiramate] Other (See Comments)    unresponsive  Episodes ? SYNCOPE ?  . Keppra [Levetiracetam] Palpitations and Other (See Comments)    "jittery", and "loopy." per pt.  . Neurontin [Gabapentin] Other (See Comments)    TREMORS "jittery" and "loopy" per pt.  Marland Kitchen Cymbalta [Duloxetine Hcl] Other (See Comments)    "jittery" and "loopy" per pt    Family History: Family History  Problem Relation Age of Onset  . Coronary artery disease Father        PPM in his late 46s, CAD Dx 68s  . Hyperlipidemia Father   . Hypertension Father   . Heart disease Father        CABG at age 58  . Diabetes Father   . Atrial fibrillation  Mother   . Hypertension Mother   . Transient ischemic attack Mother   . Dementia Mother   . Migraines Daughter   . Cancer Maternal Uncle        Throat  . Cancer Maternal Grandmother        Lung cancer    Social History:  reports that he quit smoking about 10 years ago. His smoking use included cigarettes. He has a 45.00 pack-year smoking history. He quit smokeless tobacco use about 22 years ago. He reports that he does not drink alcohol or use drugs.  ROS: UROLOGY Frequent Urination?: No Hard to postpone urination?: No Burning/pain with urination?: No Get up at night  to urinate?: No Leakage of urine?: No Urine stream starts and stops?: No Trouble starting stream?: No Do you have to strain to urinate?: No Blood in urine?: No Urinary tract infection?: No Sexually transmitted disease?: No Injury to kidneys or bladder?: No Painful intercourse?: No Weak stream?: No Erection problems?: No Penile pain?: No  Gastrointestinal Nausea?: No Vomiting?: No Indigestion/heartburn?: No Diarrhea?: No Constipation?: No  Constitutional Fever: No Night sweats?: No Weight loss?: No Fatigue?: No  Skin Skin rash/lesions?: No Itching?: No  Eyes Blurred vision?: No Double vision?: No  Ears/Nose/Throat Sore throat?: No Sinus problems?: No  Hematologic/Lymphatic Swollen glands?: No Easy bruising?: No  Cardiovascular Leg swelling?: No Chest pain?: No  Respiratory Cough?: No Shortness of breath?: No  Endocrine Excessive thirst?: No  Musculoskeletal Back pain?: No Joint pain?: No  Neurological Headaches?: No Dizziness?: No  Psychologic Depression?: No Anxiety?: No  Physical Exam: BP 128/76 (BP Location: Left Arm, Patient Position: Sitting, Cuff Size: Normal)   Pulse 68   Ht 5\' 8"  (1.727 m)   Wt 216 lb 12.8 oz (98.3 kg)   BMI 32.96 kg/m   Constitutional:  Well nourished. Alert and oriented, No acute distress. HEENT: [place] AT, moist mucus membranes.  Trachea  midline, no masses. Cardiovascular: No clubbing, cyanosis, or edema. Respiratory: Normal respiratory effort, no increased work of breathing. GU: No CVA tenderness.  No bladder fullness or masses. Patient with normal phallus.  Urethral meatus is patent.  No penile discharge. No penile lesions or rashes.  No penile plaques.  Scrotum without lesions, cysts, rashes and/or edema.  Testicles are located scrotally bilaterally, estimated volume approximately 15 cc. No masses are appreciated in the testicles. Left and right epididymis are normal. Skin: No rashes, bruises or suspicious lesions. Neurologic: Grossly intact, no focal deficits, moving all 4 extremities. Psychiatric: Normal mood and affect.  Assessment & Plan:    1. Erectile dysfunction He presently has no erections and PDE 5 inhibitors have not been effective.  He has a remote history of hypogonadism which has not been treated.  I do not see a recent testosterone level.  A testosterone level was ordered.  Additional treatment options were discussed including intracavernosal injections and vacuum erection devices.  He was provided literature.  Return for will call with testosterone results .  Abbie Sons, [place] [address] [place], [place] [address] [phone]  I, Lucas Mallow, am acting as a scribe for Dr. Nicki Reaper C. Symphani Eckstrom,  I, Abbie Sons, MD, have reviewed all documentation for this visit. The documentation on 09/09/18 for the exam, diagnosis, procedures, and orders are all accurate and complete.

## 2018-09-08 NOTE — Patient Instructions (Signed)
Testosterone Replacement Therapy  Testosterone replacement therapy (TRT) is used to treat men who have a low testosterone level (hypogonadism). Testosterone is a male hormone that is produced in the testicles. It is responsible for typically male characteristics and for maintaining a man's sex drive and the ability to get an erection. Testosterone also supports bone and muscle health. TRT can be a gel, liquid, or patch that you put on your skin. It can also be in the form of a tablet or an injection. In some cases, your health care provider may insert long-acting pellets under your skin. In most men, the level of testosterone starts to decline gradually after age 58. Low testosterone can also be caused by certain medical conditions, medicines, and obesity. Your health care provider can diagnose hypogonadism with at least two blood tests that are done early in the morning. Low testosterone may not need to be treated. TRT is usually a choice that you make with your health care provider. Your health care provider may recommend TRT if you have low testosterone that is causing symptoms, such as:  Low sex drive.  Erection problems.  Breast enlargement.  Loss of body hair.  Weak muscles or bones.  Shrinking testicles.  Increased body fat.  Low energy.  Hot flashes.  Depression.  Decreased work Systems analyst. TRT is a lifetime treatment. If you stop treatment, your testosterone will drop, and your symptoms may return. What are the risks? Testosterone replacement therapy may have side effects, including:  Lower sperm count.  Skin irritation at the application or injection site.  Mouth irritation if you take an oral tablet.  Acne.  Swelling of your legs or feet.  Tender breasts.  Dizziness.  Sleep disturbance.  Mood swings.  Possible increased risk of stroke or heart attack. Testosterone replacement therapy may also increase your risk for prostate cancer or male breast cancer.  You should not use TRT if you have either of those conditions. Your health care provider also may not recommend TRT if:  You are suspected of having prostate cancer.  You want to father a child.  You have a high number of red blood cells.  You have untreated sleep apnea.  You have a very large prostate. Supplies needed:  Your health care provider will prescribe the testosterone gel, solution, or medicine that you need. If your health care provider teaches you to do self-injections at home, you will also need: ? Your medicine vial. ? Disposable needles and syringes. ? Alcohol swabs. ? A needle disposal container. ? Adhesive bandages. How to use testosterone replacement therapy Your health care provider will help you find the TRT option that will work best for you based on your preference, the side effects, and the cost. You may:  Rub testosterone gel on your upper arm or shoulder every day after a shower. This is the most common type of TRT. Do not let women or children come in contact with the gel.  Apply a testosterone solution under your arms once each day.  Place a testosterone patch on your skin once each day.  Dissolve a testosterone tablet in your mouth twice each day.  Have a testosterone pellet inserted under your skin by your health care provider. This will be replaced every 3-6 months.  Use testosterone nasal spray three times each day.  Get testosterone injections. For some types of testosterone, your health care provider will give you this injection. With other types of testosterone, you may be taught to give injections to  provider. This will be replaced every 3-6 months.  · Use testosterone nasal spray three times each day.  · Get testosterone injections. For some types of testosterone, your health care provider will give you this injection. With other types of testosterone, you may be taught to give injections to yourself. The frequency of injections may vary based on the type of testosterone that you receive.  Follow these instructions at home:  · Take over-the-counter and prescription medicines only as told by your health care provider.  · Lose weight if you are overweight. Ask your health care provider to help you start a healthy diet and exercise program to  reach and maintain a healthy weight.  · Work with your health care provider to treat other medical conditions that may lower your testosterone. These include obesity, high blood pressure, high cholesterol, diabetes, liver disease, kidney disease, and sleep apnea.  · Keep all follow-up visits as told by your health care provider. This is important.  General recommendations  · Discuss all risks and benefits with your health care provider before starting therapy.  · Work with your health care provider to check your prostate health and do blood testing before you start therapy.  · Do not use any testosterone replacement therapies that are not prescribed by your health care provider or not approved for use in the [place]  · Do not use TRT for bodybuilding or to improve sexual performance. TRT should be used only to treat symptoms of low testosterone.  · Return for all repeat prostate checks and blood tests during therapy, as told by your health care provider.  Where to find more information  Learn more about testosterone replacement therapy from:  · American Urological Foundation: www.urologyhealth.org/urologic-conditions/low-testosterone-(hypogonadism)  · Endocrine Society: www.hormone.org/diseases-and-conditions/mens-health/hypogonadism  Contact a health care provider if:  · You have side effects from your testosterone replacement therapy.  · You continue to have symptoms of low testosterone during treatment.  · You develop new symptoms during treatment.  Summary  · Testosterone replacement therapy is only for men who have low testosterone as determined by blood testing and who have symptoms of low testosterone.  · Testosterone replacement therapy should be prescribed only by a health care provider and should be used under the supervision of a health care provider.  · You may not be able to take testosterone if you have certain medical conditions, including prostate cancer, male breast cancer, or heart  disease.  · Testosterone replacement therapy may have side effects and may make some medical conditions worse.  · Talk with your health care provider about all the risks and benefits before you start therapy.  This information is not intended to replace advice given to you by your health care provider. Make sure you discuss any questions you have with your health care provider.  Document Released: 04/09/2016 Document Revised: 04/09/2016 Document Reviewed: 04/09/2016  Elsevier Interactive Patient Education © 2019 Elsevier Inc.

## 2018-09-09 ENCOUNTER — Encounter: Payer: Self-pay | Admitting: Urology

## 2018-09-09 LAB — TESTOSTERONE: Testosterone: 132 ng/dL — ABNORMAL LOW (ref 264–916)

## 2018-09-09 LAB — LUTEINIZING HORMONE: LH: 7 m[IU]/mL (ref 1.7–8.6)

## 2018-09-12 ENCOUNTER — Other Ambulatory Visit: Payer: Self-pay | Admitting: Urology

## 2018-09-12 ENCOUNTER — Telehealth: Payer: Self-pay | Admitting: Family Medicine

## 2018-09-12 MED ORDER — CLOMIPHENE CITRATE 50 MG PO TABS: 25.0000 mg | ORAL_TABLET | Freq: Every day | ORAL | 0 refills | Status: DC

## 2018-09-12 NOTE — Telephone Encounter (Signed)
LMOM for patient to return call.

## 2018-09-12 NOTE — Telephone Encounter (Signed)
-----   Message from Abbie Sons, MD sent at 09/12/2018  7:45 AM EST ----- Testosterone level was low at 132 which may be contributing to his ED.  Would recommend an initial trial of Clomid.  Rx was sent to pharmacy.  Follow-up 6 weeks for testosterone level and symptom recheck.

## 2018-09-13 ENCOUNTER — Ambulatory Visit: Payer: 59 | Admitting: Podiatry

## 2018-09-14 NOTE — Telephone Encounter (Signed)
Mychart message sent.

## 2018-09-16 ENCOUNTER — Ambulatory Visit: Payer: 59 | Admitting: Podiatry

## 2018-09-16 ENCOUNTER — Encounter: Payer: Self-pay | Admitting: Podiatry

## 2018-09-16 DIAGNOSIS — I70235 Atherosclerosis of native arteries of right leg with ulceration of other part of foot: Secondary | ICD-10-CM

## 2018-09-16 DIAGNOSIS — L97522 Non-pressure chronic ulcer of other part of left foot with fat layer exposed: Secondary | ICD-10-CM

## 2018-09-16 DIAGNOSIS — E0843 Diabetes mellitus due to underlying condition with diabetic autonomic (poly)neuropathy: Secondary | ICD-10-CM | POA: Diagnosis not present

## 2018-09-18 ENCOUNTER — Other Ambulatory Visit: Payer: Self-pay | Admitting: Family Medicine

## 2018-09-18 DIAGNOSIS — F320 Major depressive disorder, single episode, mild: Secondary | ICD-10-CM

## 2018-09-18 DIAGNOSIS — I10 Essential (primary) hypertension: Secondary | ICD-10-CM

## 2018-09-19 NOTE — Telephone Encounter (Signed)
Pharmacy requesting refills. Thanks!  

## 2018-09-19 NOTE — Progress Notes (Signed)
   HPI: 58 year old male history of diabetes mellitus and prior toe amputations presents to the office today for follow up evaluation of ulcerations to bilateral feet. He states he is doing well and the wounds are healing appropriately. He reports a new complaint of bilateral great toenail becoming detached about one week ago. He states this is a common occurrence. He reports some associated bleeding at the time of detachment but denies any at this time. He has not done anything for treatment.   Past Medical History:  Diagnosis Date  . Cancer ([place])    SKIN   . Depression   . Diabetes mellitus without complication ([place])    Type II  . Dyspnea    with exertion   . Fatigue   . Hyperlipidemia   . Hypertension   . Hypogonadism male   . Neuromuscular disorder ([place])    "back nerve stimulator"  . Neuropathy   . PAD (peripheral artery disease) ([place])   . Sleep apnea    CPAP  . Tinnitus   . Tuberculosis    POSITIVE  TB SKIN TEST 1992.6 MTH [place] .was exposed to someone who had it.     Physical Exam: General: The patient is alert and oriented x3 in no acute distress.  Dermatology: Wound noted to the toes bilaterally have healed. Complete re-epithelialization has occurred. No drainage noted.   Wound noted to the left posterior heel has healed. Complete re-epithelialization has occurred. No drainage noted.   Vascular: Palpable pedal pulses bilaterally. No erythema or edema. Capillary refill within normal limits.  Neurological: Epicritic and protective threshold absent bilaterally.   Musculoskeletal Exam: History of right fifth toe amputation.   Assessment: 1. Ulcer bilateral digits secondary to diabetes mellitus - healed 2. Ulceration of the left posterior heel secondary to diabetes mellitus - healed    Plan of Care:  1. Patient evaluated.   2. Continue wearing good shoe gear.  3. Continue using Gentamicin cream daily with a bandage.  4. Return to clinic as needed for possible total  permanent nail avulsions if they become symptomatic.     Edrick Kins, DPM [facility]  Dr. Edrick Kins, DPM    [address], [place] [address]                Office [phone]  Fax [phone]

## 2018-10-03 DIAGNOSIS — G894 Chronic pain syndrome: Secondary | ICD-10-CM | POA: Diagnosis not present

## 2018-10-03 DIAGNOSIS — Z79891 Long term (current) use of opiate analgesic: Secondary | ICD-10-CM | POA: Diagnosis not present

## 2018-10-03 DIAGNOSIS — M545 Low back pain: Secondary | ICD-10-CM | POA: Diagnosis not present

## 2018-10-31 DIAGNOSIS — M545 Low back pain: Secondary | ICD-10-CM | POA: Diagnosis not present

## 2018-10-31 DIAGNOSIS — M542 Cervicalgia: Secondary | ICD-10-CM | POA: Diagnosis not present

## 2018-10-31 DIAGNOSIS — M961 Postlaminectomy syndrome, not elsewhere classified: Secondary | ICD-10-CM | POA: Diagnosis not present

## 2018-11-01 ENCOUNTER — Encounter

## 2018-11-11 ENCOUNTER — Other Ambulatory Visit: Payer: Self-pay | Admitting: Family Medicine

## 2018-11-28 DIAGNOSIS — M545 Low back pain: Secondary | ICD-10-CM | POA: Diagnosis not present

## 2018-11-28 DIAGNOSIS — M961 Postlaminectomy syndrome, not elsewhere classified: Secondary | ICD-10-CM | POA: Diagnosis not present

## 2018-11-28 DIAGNOSIS — M542 Cervicalgia: Secondary | ICD-10-CM | POA: Diagnosis not present

## 2018-11-28 DIAGNOSIS — G894 Chronic pain syndrome: Secondary | ICD-10-CM | POA: Diagnosis not present

## 2018-12-05 DIAGNOSIS — D485 Neoplasm of uncertain behavior of skin: Secondary | ICD-10-CM | POA: Diagnosis not present

## 2018-12-05 DIAGNOSIS — D229 Melanocytic nevi, unspecified: Secondary | ICD-10-CM | POA: Diagnosis not present

## 2018-12-05 DIAGNOSIS — L570 Actinic keratosis: Secondary | ICD-10-CM | POA: Diagnosis not present

## 2018-12-05 DIAGNOSIS — L578 Other skin changes due to chronic exposure to nonionizing radiation: Secondary | ICD-10-CM | POA: Diagnosis not present

## 2019-01-02 ENCOUNTER — Emergency Department: Payer: Worker's Compensation

## 2019-01-02 ENCOUNTER — Encounter: Payer: Self-pay | Admitting: Emergency Medicine

## 2019-01-02 ENCOUNTER — Other Ambulatory Visit: Payer: Self-pay

## 2019-01-02 ENCOUNTER — Emergency Department
Admission: EM | Admit: 2019-01-02 | Discharge: 2019-01-02 | Disposition: A | Discharge status: Home / Self Care | Payer: Worker's Compensation | Attending: Emergency Medicine | Admitting: Emergency Medicine

## 2019-01-02 ENCOUNTER — Emergency Department: Payer: 59

## 2019-01-02 DIAGNOSIS — Y929 Unspecified place or not applicable: Secondary | ICD-10-CM | POA: Diagnosis not present

## 2019-01-02 DIAGNOSIS — E119 Type 2 diabetes mellitus without complications: Secondary | ICD-10-CM | POA: Diagnosis not present

## 2019-01-02 DIAGNOSIS — Z87891 Personal history of nicotine dependence: Secondary | ICD-10-CM | POA: Diagnosis not present

## 2019-01-02 DIAGNOSIS — S59912A Unspecified injury of left forearm, initial encounter: Secondary | ICD-10-CM | POA: Diagnosis present

## 2019-01-02 DIAGNOSIS — Y939 Activity, unspecified: Secondary | ICD-10-CM | POA: Insufficient documentation

## 2019-01-02 DIAGNOSIS — S5012XA Contusion of left forearm, initial encounter: Secondary | ICD-10-CM | POA: Diagnosis not present

## 2019-01-02 DIAGNOSIS — W260XXA Contact with knife, initial encounter: Secondary | ICD-10-CM | POA: Diagnosis not present

## 2019-01-02 DIAGNOSIS — Z23 Encounter for immunization: Secondary | ICD-10-CM | POA: Diagnosis not present

## 2019-01-02 DIAGNOSIS — Z7982 Long term (current) use of aspirin: Secondary | ICD-10-CM | POA: Insufficient documentation

## 2019-01-02 DIAGNOSIS — T07XXXA Unspecified multiple injuries, initial encounter: Secondary | ICD-10-CM

## 2019-01-02 DIAGNOSIS — Z79899 Other long term (current) drug therapy: Secondary | ICD-10-CM | POA: Diagnosis not present

## 2019-01-02 DIAGNOSIS — I10 Essential (primary) hypertension: Secondary | ICD-10-CM | POA: Diagnosis not present

## 2019-01-02 DIAGNOSIS — Y990 Civilian activity done for income or pay: Secondary | ICD-10-CM | POA: Insufficient documentation

## 2019-01-02 DIAGNOSIS — S51812A Laceration without foreign body of left forearm, initial encounter: Secondary | ICD-10-CM | POA: Diagnosis not present

## 2019-01-02 MED ORDER — OXYCODONE-ACETAMINOPHEN 5-325 MG PO TABS
1.0000 | ORAL_TABLET | Freq: Once | ORAL | Status: AC
  Administered 2019-01-02: 1 via ORAL
  Filled 2019-01-02: qty 1

## 2019-01-02 MED ORDER — HYDROCODONE-ACETAMINOPHEN 5-325 MG PO TABS: 1.0000 | ORAL_TABLET | Freq: Four times a day (QID) | ORAL | 0 refills | Status: DC | PRN

## 2019-01-02 MED ORDER — TETANUS-DIPHTH-ACELL PERTUSSIS 5-2.5-18.5 LF-MCG/0.5 IM SUSP
0.5000 mL | Freq: Once | INTRAMUSCULAR | Status: AC
  Administered 2019-01-02: 0.5 mL via INTRAMUSCULAR
  Filled 2019-01-02: qty 0.5

## 2019-01-02 MED ORDER — LIDOCAINE-EPINEPHRINE-TETRACAINE (LET) SOLUTION
3.0000 mL | Freq: Once | NASAL | Status: AC
  Administered 2019-01-02: 3 mL via TOPICAL
  Filled 2019-01-02: qty 3

## 2019-01-02 MED ORDER — LIDOCAINE-EPINEPHRINE 2 %-1:100000 IJ SOLN
30.0000 mL | Freq: Once | INTRAMUSCULAR | Status: AC
  Administered 2019-01-02: 30 mL

## 2019-01-02 NOTE — Discharge Instructions (Signed)
Return to the emergency department in 12 days for suture removal.  Return sooner if any signs of infection or urgent concerns.  Elevate arm frequently to reduce swelling.  A prescription for Norco was sent to your pharmacy to take as needed for pain.  Do not drive or operate machinery while taking this medication.  Also a letter to your employer stating that you are to keep the area clean and dry with limited use of your left arm. Leave dressing on for 2 days and then began cleaning with mild soapy water allowing it to dry before changing dressings. WOUND CARE Please return in 12 days to have your stitches/staples removed or sooner if you have concerns.  Keep area clean and dry for 24 hours. Do not remove bandage, if applied.  After 24 hours, remove bandage and wash wound gently with mild soap and warm water. Reapply a new bandage after cleaning wound, if directed.  Continue daily cleansing with soap and water until stitches/staples are removed.  Do not apply any ointments or creams to the wound while stitches/staples are in place, as this may cause delayed healing.  Notify the office if you experience any of the following signs of infection: Swelling, redness, pus drainage, streaking, fever >101.0 F  Notify the office if you experience excessive bleeding that does not stop after 15-20 minutes of constant, firm pressure.

## 2019-01-02 NOTE — ED Triage Notes (Signed)
Presents via EMS from work  states he was using a blade  It slipped  Laceration to left f/a

## 2019-01-02 NOTE — ED Provider Notes (Signed)
[facility] Emergency Department Provider Note   ____________________________________________   First MD Initiated Contact with Patient 01/02/19 [phone]     (approximate)  I have reviewed the triage vital signs and the nursing notes.   HISTORY  Chief Complaint Extremity Laceration   HPI Jeff [name] is a 58 y.o. male is brought to the ED via EMS with an injury to his left arm that occurred while he was at work.  Patient was using a blade which slipped cutting his left forearm.  Bleeding was controlled at the scene.  Patient is unsure of his last tetanus.  He denies any other injuries.  He rates his pain as a 4/10.     Past Medical History:  Diagnosis Date  . Cancer ([place])    SKIN   . Depression   . Diabetes mellitus without complication ([place])    Type II  . Dyspnea    with exertion   . Fatigue   . Hyperlipidemia   . Hypertension   . Hypogonadism male   . Neuromuscular disorder ([place])    "back nerve stimulator"  . Neuropathy   . PAD (peripheral artery disease) ([place])   . Sleep apnea    CPAP  . Tinnitus   . Tuberculosis    POSITIVE  TB SKIN TEST 1992.6 MTH TX .was exposed to someone who had it.    Patient Active Problem List   Diagnosis Date Noted  . DDD (degenerative disc disease), cervical 02/25/2016  . H/O cervical spine surgery 02/25/2016  . Cervical facet syndrome 02/25/2016  . DDD (degenerative disc disease), lumbar 02/25/2016  . Facet syndrome, lumbar 02/25/2016  . Sacroiliac joint dysfunction 02/25/2016  . Atherosclerotic peripheral vascular disease ([place]) 02/25/2016  . Abnormal kidney function 12/06/2014  . Cervical nerve root disorder 12/06/2014  . Colon polyp 12/06/2014  . Clinical depression 12/06/2014  . Essential (primary) hypertension 12/06/2014  . Cephalalgia 12/06/2014  . Bulge of cervical disc without myelopathy 12/06/2014  . HLD (hyperlipidemia) 12/06/2014  . 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 Displacement of lumbar  intervertebral disc without myelopathy 12/06/2014  . L-S radiculopathy 12/06/2014  . Mild major depression ([place]) 12/06/2014  . Neuropathy 12/06/2014  . Adiposity 12/06/2014  . Peripheral vascular disease ([place]) 12/06/2014  . Diabetes mellitus, type 2 ([place]) 12/06/2014  . Cervical post-laminectomy syndrome 10/04/2013  . Chronic neck pain 10/04/2013  . Peripheral neuropathic pain 10/04/2013  . Fatigue 03/17/2013  . Chest pain 01/28/2013  . Seizures ([place]) 01/28/2013  . Syncope 01/28/2013  . Chronic pain associated with significant psychosocial dysfunction 08/17/2012  . Polypharmacy 08/02/2012  . 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 Post laminectomy syndrome 08/02/2012  . Thoracic spinal stenosis 06/22/2011    Past Surgical History:  Procedure Laterality Date  . AMPUTATION TOE Right 03/04/2018   Procedure: AMPUTATION TOE/MPJ JOINT FIFTH RIGHT;  Surgeon: Edrick Kins, DPM;  Location: [facility];  Service: Podiatry;  Laterality: Right;  . BACK SURGERY  2012   neck was 2012,back same year. plate in [id number]  . CARDIAC CATHETERIZATION  01/31/2013   Medical management  . CARPAL TUNNEL RELEASE Bilateral   . CATARACT EXTRACTION Right 2014  . CATARACT EXTRACTION W/PHACO Left 03/12/2016   Procedure: CATARACT EXTRACTION PHACO AND INTRAOCULAR LENS PLACEMENT (IOC);  Surgeon: Birder Robson, MD;  Location: [facility] ORS;  Service: Ophthalmology;  Laterality: Left;  [place] 00:31AP% 17.9CDE 5.67Fluid pack lot # [id number]  . CERVICAL FUSION  2012  . CORONARY ANGIOPLASTY  2014   all good  .  KNEE ARTHROSCOPY Left   . LEFT HEART CATHETERIZATION WITH CORONARY ANGIOGRAM N/A 01/31/2013   Procedure: LEFT HEART CATHETERIZATION WITH CORONARY ANGIOGRAM;  Surgeon: Minus Breeding, MD;  Location: [facility] CATH LAB;  Service: Cardiovascular;  Laterality: N/A;  . LUMBAR LAMINECTOMY/DECOMPRESSION MICRODISCECTOMY  06/22/2011   Procedure: LUMBAR LAMINECTOMY/DECOMPRESSION MICRODISCECTOMY;  Surgeon: Ophelia Charter;  Location: [facility]  NEURO ORS;  Service: Neurosurgery;  Laterality: N/A;  Thoracic Ten-Eleven,Thoracic Eleven-Twelve Laminectomy  . Pain stimulator    . ulnar N/A   . VASECTOMY  1991    Prior to Admission medications   Medication Sig Start Date End Date Taking? Authorizing Provider  ARIPiprazole (ABILIFY) 5 MG tablet TAKE 1 TABLET (5 MG TOTAL) BY MOUTH DAILY. 09/20/18   Jerrol Banana., MD  aspirin 81 MG tablet Take 81 mg by mouth daily.    [provider]  atorvastatin (LIPITOR) 10 MG tablet TAKE 1 TABLET BY MOUTH AT BEDTIME 09/20/18   Jerrol Banana., MD  baclofen (LIORESAL) 10 MG tablet TAKE 1 TABLET BY MOUTH 3 TIMES A DAY AS NEEDED FOR SPASMS 12/23/15   [provider]  clomiPHENE (CLOMID) 50 MG tablet Take 0.5 tablets (25 mg total) by mouth daily. 09/12/18   Stoioff, Ronda Fairly, MD  gentamicin cream (GARAMYCIN) 0.1 % Apply 1 application topically 2 (two) times daily. 08/23/18   Edrick Kins, DPM  hydrochlorothiazide (HYDRODIURIL) 25 MG tablet TAKE 1 TABLET BY MOUTH EVERY DAY. *INSURANCE ONLY COVERS 30 DAYS** 04/18/18   Jerrol Banana., MD  HYDROcodone-acetaminophen (NORCO/VICODIN) 5-325 MG tablet Take 1 tablet by mouth every 6 (six) hours as needed for moderate pain. 01/02/19   [name], PA-C  hydrocortisone 2.5 % cream Apply topically. 03/27/14   [provider]  JARDIANCE 25 MG TABS tablet TAKE 1 TABLET BY MOUTH DAILY 05/20/18   Jerrol Banana., MD  losartan (COZAAR) 100 MG tablet TAKE 1 TABLET BY MOUTH EVERY DAY 01/17/18   Jerrol Banana., MD  LYRICA 100 MG capsule LIMIT 1 CAPSULE BY MOUTH 3 - 5 TIMES PER DAY IF TOLERATED (NOTE THAT CAPSULE IS NOW 100 MG SIZE) 08/30/17   [provider]  metFORMIN (GLUCOPHAGE) 1000 MG tablet TAKE 1 TABLET BY MOUTH TWICE A DAY WITH A MEAL 11/11/18   Jerrol Banana., MD  metoprolol succinate (TOPROL-XL) 50 MG 24 hr tablet TAKE 1 TABLET (50 MG TOTAL) BY MOUTH DAILY. 09/20/18   Jerrol Banana., MD   [facility] VERIO test strip CHECK SUGAR ONCE DAILY DX E11.9 02/15/18   Jerrol Banana., MD  orphenadrine (NORFLEX) 100 MG tablet Take 100 mg by mouth 2 (two) times daily.    [provider]  sildenafil (REVATIO) 20 MG tablet 1-5 daily as needed 08/11/18   Jerrol Banana., MD  venlafaxine (EFFEXOR) 75 MG tablet TAKE 3 TABLETS (225 MG TOTAL) BY MOUTH DAILY. 08/18/18   Jerrol Banana., MD    Allergies Topamax [topiramate]; Keppra [levetiracetam]; Neurontin [gabapentin]; and Cymbalta [duloxetine hcl]  Family History  Problem Relation Age of Onset  . Coronary artery disease Father        PPM in his late 8s, CAD Dx 79s  . Hyperlipidemia Father   . Hypertension Father   . Heart disease Father        CABG at age 58  . Diabetes Father   . Atrial fibrillation Mother   . Hypertension Mother   . Transient ischemic attack  Mother   . Dementia Mother   . Migraines Daughter   . Cancer Maternal Uncle        Throat  . Cancer Maternal Grandmother        Lung cancer    Social History Social History   Tobacco Use  . Smoking status: Former Smoker    Packs/day: 1.50    Years: 30.00    Pack years: 45.00    Types: Cigarettes    Last attempt to quit: 2010    Years since quitting: 10.4  . Smokeless tobacco: Former Systems developer    Quit date: 12/16/1995  Substance Use Topics  . Alcohol use: No    Alcohol/week: 1.0 standard drinks    Types: 1 Standard drinks or equivalent per week    Comment: Beer  . Drug use: No    Review of Systems Constitutional: No fever/chills Cardiovascular: Denies chest pain. Respiratory: Denies shortness of breath. Musculoskeletal: Left forearm pain. Skin: Laceration left forearm. Neurological: Negative for  focal weakness or numbness. ____________________________________________   PHYSICAL EXAM:  VITAL SIGNS: ED Triage Vitals [01/02/19 0923]  Enc Vitals Group     BP (!) 152/76     Pulse Rate 60     Resp 18     Temp 97.6 F (36.4 C)      Temp Source Oral     SpO2 97 %     Weight 212 lb (96.2 kg)     Height 5\' 8"  (1.727 m)     Head Circumference      Peak Flow      Pain Score 4     Pain Loc      Pain Edu?      Excl. in [place]?    Constitutional: Alert and oriented. Well appearing and in no acute distress. Eyes: Conjunctivae are normal.  Head: Atraumatic. Neck: No stridor.   Cardiovascular: Normal rate, regular rhythm. Grossly normal heart sounds.  Good peripheral circulation. Respiratory: Normal respiratory effort.  No retractions. Lungs CTAB. Gastrointestinal: Soft and nontender. No distention.  Musculoskeletal: Examination of the left upper extremity there is a large laceration noted to the dorsal aspect of the forearm with bleeding controlled.  No obvious deformity noted or foreign body.  Motor sensory function intact.  Capillary refill is less than 3 seconds. Neurologic:  Normal speech and language. No gross focal neurologic deficits are appreciated. No gait instability. Skin:  Skin is warm, dry.  Laceration dorsal aspect left forearm present with bleeding controlled.  No foreign body is noted.  Laceration in areas shows subcutaneous tissue only.  No obvious foreign body was noted.  Images were obtained to rule out any metallic foreign body. Psychiatric: Mood and affect are normal. Speech and behavior are normal.  ____________________________________________   LABS (all labs ordered are listed, but only abnormal results are displayed)  Labs Reviewed - No data to display ____________________________________________  RADIOLOGY  ED MD interpretation:  Left forearm negative for acute bony injury.  No foreign body noted.  Official radiology report(s): Dg Forearm Left  Result Date: 01/02/2019 CLINICAL DATA:  58 year old male with left forearm laceration. Query foreign body. EXAM: LEFT FOREARM - 2 VIEW COMPARISON:  None. FINDINGS: Soft tissue injury and irregularity along the mid forearm. No radiopaque foreign body  identified. Trace if any soft tissue gas. Underlying normal bone mineralization. No osseous abnormality identified. IMPRESSION: Soft tissue injury with no radiopaque foreign body identified. Electronically Signed   By: Genevie Ann M.D.   On: 01/02/2019 10:24  ____________________________________________   PROCEDURES  Procedure(s) performed (including Critical Care):  Marland KitchenMarland KitchenLaceration Repair Date/Time: 01/02/2019 11:39 AM Performed by: [name], PA-C Authorized by: [name], PA-C   Consent:    Consent obtained:  Verbal   Consent given by:  Patient   Risks discussed:  Pain, infection, retained foreign body and poor wound healing Anesthesia (see MAR for exact dosages):    Anesthesia method:  Topical application and local infiltration   Topical anesthetic:  LET   Local anesthetic:  Lidocaine 1% WITH epi Laceration details:    Location:  Shoulder/arm   Shoulder/arm location:  L lower arm   Length (cm):  11 Repair type:    Repair type:  Intermediate Pre-procedure details:    Preparation:  Patient was prepped and draped in usual sterile fashion and imaging obtained to evaluate for foreign bodies Exploration:    Hemostasis achieved with:  LET   Wound exploration: wound explored through full range of motion and entire depth of wound probed and visualized     Contaminated: no   Treatment:    Area cleansed with:  Betadine and saline   Amount of cleaning:  Extensive   Irrigation solution:  Sterile saline   Irrigation method:  Syringe   Visualized foreign bodies/material removed: no   Subcutaneous repair:    Suture size:  4-0   Suture material:  Vicryl   Suture technique:  Simple interrupted   Number of sutures:  8 Skin repair:    Repair method:  Sutures   Suture size:  4-0   Suture material:  Nylon   Suture technique:  Simple interrupted   Number of sutures:  13 Approximation:    Approximation:  Close Post-procedure details:    Dressing:  Non-adherent dressing    Patient tolerance of procedure:  Tolerated well, no immediate complications    ____________________________________________   INITIAL IMPRESSION / ASSESSMENT AND PLAN / ED COURSE  As part of my medical decision making, I reviewed the following data within the electronic MEDICAL RECORD NUMBER Notes from prior ED visits and [place] Controlled Substance Database  58 year old male presents to the ED via EMS with a Workmen's Comp. injury laceration to his left forearm measuring 11 cm.  Imaging was obtained and no foreign body was noted.  Patient had good motor sensory function, capillary refill, and range of motion.  Area was irrigated numerous times and sutured.  Patient had no complications from this procedure and did well. ____________________________________________   FINAL CLINICAL IMPRESSION(S) / ED DIAGNOSES  Final diagnoses:  Laceration of left forearm, initial encounter  Contusion of left forearm, initial encounter  Multiple abrasions     ED Discharge Orders         Ordered    HYDROcodone-acetaminophen (NORCO/VICODIN) 5-325 MG tablet  Every 6 hours PRN     01/02/19 1146           Note:  This document was prepared using Dragon voice recognition software and may include unintentional dictation errors.    [name], PA-C 01/02/19 1509    Duffy Bruce, MD 01/02/19 1925

## 2019-01-14 ENCOUNTER — Other Ambulatory Visit: Payer: Self-pay | Admitting: Family Medicine

## 2019-01-14 DIAGNOSIS — I10 Essential (primary) hypertension: Secondary | ICD-10-CM

## 2019-02-11 ENCOUNTER — Other Ambulatory Visit: Payer: Self-pay | Admitting: Family Medicine

## 2019-02-17 ENCOUNTER — Emergency Department
Admission: EM | Admit: 2019-02-17 | Discharge: 2019-02-17 | Disposition: A | Discharge status: Home / Self Care | Payer: Worker's Compensation | Attending: Emergency Medicine | Admitting: Emergency Medicine

## 2019-02-17 ENCOUNTER — Other Ambulatory Visit: Payer: Self-pay

## 2019-02-17 ENCOUNTER — Encounter: Payer: Self-pay | Admitting: Emergency Medicine

## 2019-02-17 ENCOUNTER — Emergency Department: Payer: Worker's Compensation

## 2019-02-17 DIAGNOSIS — Z79899 Other long term (current) drug therapy: Secondary | ICD-10-CM | POA: Diagnosis not present

## 2019-02-17 DIAGNOSIS — E119 Type 2 diabetes mellitus without complications: Secondary | ICD-10-CM | POA: Insufficient documentation

## 2019-02-17 DIAGNOSIS — I10 Essential (primary) hypertension: Secondary | ICD-10-CM | POA: Insufficient documentation

## 2019-02-17 DIAGNOSIS — M545 Low back pain: Secondary | ICD-10-CM | POA: Insufficient documentation

## 2019-02-17 DIAGNOSIS — E785 Hyperlipidemia, unspecified: Secondary | ICD-10-CM | POA: Insufficient documentation

## 2019-02-17 DIAGNOSIS — Z7984 Long term (current) use of oral hypoglycemic drugs: Secondary | ICD-10-CM | POA: Insufficient documentation

## 2019-02-17 DIAGNOSIS — Z85828 Personal history of other malignant neoplasm of skin: Secondary | ICD-10-CM | POA: Insufficient documentation

## 2019-02-17 DIAGNOSIS — Z888 Allergy status to other drugs, medicaments and biological substances status: Secondary | ICD-10-CM | POA: Insufficient documentation

## 2019-02-17 DIAGNOSIS — G8929 Other chronic pain: Secondary | ICD-10-CM | POA: Diagnosis not present

## 2019-02-17 DIAGNOSIS — M542 Cervicalgia: Secondary | ICD-10-CM | POA: Insufficient documentation

## 2019-02-17 DIAGNOSIS — Z87891 Personal history of nicotine dependence: Secondary | ICD-10-CM | POA: Diagnosis not present

## 2019-02-17 DIAGNOSIS — Z7982 Long term (current) use of aspirin: Secondary | ICD-10-CM | POA: Diagnosis not present

## 2019-02-17 LAB — COMPREHENSIVE METABOLIC PANEL
ALT: 19 U/L (ref 0–44)
AST: 20 U/L (ref 15–41)
Albumin: 4 g/dL (ref 3.5–5.0)
Alkaline Phosphatase: 80 U/L (ref 38–126)
Anion gap: 10 (ref 5–15)
BUN: 17 mg/dL (ref 6–20)
CO2: 27 mmol/L (ref 22–32)
Calcium: 8.4 mg/dL — ABNORMAL LOW (ref 8.9–10.3)
Chloride: 95 mmol/L — ABNORMAL LOW (ref 98–111)
Creatinine, Ser: 1.51 mg/dL — ABNORMAL HIGH (ref 0.61–1.24)
GFR calc Af Amer: 57 mL/min — ABNORMAL LOW (ref >60)
GFR calc non Af Amer: 49 mL/min — ABNORMAL LOW (ref >60)
Glucose, Bld: 126 mg/dL — ABNORMAL HIGH (ref 70–99)
Potassium: 4 mmol/L (ref 3.5–5.1)
Sodium: 132 mmol/L — ABNORMAL LOW (ref 135–145)
Total Bilirubin: 1.5 mg/dL — ABNORMAL HIGH (ref 0.3–1.2)
Total Protein: 7.9 g/dL (ref 6.5–8.1)

## 2019-02-17 LAB — CBC WITH DIFFERENTIAL/PLATELET
Abs Immature Granulocytes: 0.03 10*3/uL (ref 0.00–0.07)
Basophils Absolute: 0.1 10*3/uL (ref 0.0–0.1)
Basophils Relative: 1 %
Eosinophils Absolute: 0.2 10*3/uL (ref 0.0–0.5)
Eosinophils Relative: 2 %
HCT: 50.6 % (ref 39.0–52.0)
Hemoglobin: 17.5 g/dL — ABNORMAL HIGH (ref 13.0–17.0)
Immature Granulocytes: 0 %
Lymphocytes Relative: 29 %
Lymphs Abs: 2.8 10*3/uL (ref 0.7–4.0)
MCH: 31.6 pg (ref 26.0–34.0)
MCHC: 34.6 g/dL (ref 30.0–36.0)
MCV: 91.5 fL (ref 80.0–100.0)
Monocytes Absolute: 0.9 10*3/uL (ref 0.1–1.0)
Monocytes Relative: 9 %
Neutro Abs: 5.7 10*3/uL (ref 1.7–7.7)
Neutrophils Relative %: 59 %
Platelets: 230 10*3/uL (ref 150–400)
RBC: 5.53 MIL/uL (ref 4.22–5.81)
RDW: 12.4 % (ref 11.5–15.5)
WBC: 9.6 10*3/uL (ref 4.0–10.5)
nRBC: 0 % (ref 0.0–0.2)
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
## 2019-02-20 ENCOUNTER — Encounter: Payer: Self-pay | Admitting: Family Medicine

## 2019-02-20 ENCOUNTER — Other Ambulatory Visit: Payer: Self-pay

## 2019-02-20 ENCOUNTER — Ambulatory Visit (INDEPENDENT_AMBULATORY_CARE_PROVIDER_SITE_OTHER): Payer: Worker's Compensation | Admitting: Family Medicine

## 2019-02-20 VITALS — BP 134/72 | HR 68 | Temp 98.2°F | Resp 16 | Wt 209.0 lb

## 2019-02-20 DIAGNOSIS — I2510 Atherosclerotic heart disease of native coronary artery without angina pectoris: Secondary | ICD-10-CM

## 2019-02-20 DIAGNOSIS — S161XXA Strain of muscle, fascia and tendon at neck level, initial encounter: Secondary | ICD-10-CM | POA: Diagnosis not present

## 2019-02-20 DIAGNOSIS — E1142 Type 2 diabetes mellitus with diabetic polyneuropathy: Secondary | ICD-10-CM | POA: Diagnosis not present

## 2019-02-20 DIAGNOSIS — F320 Major depressive disorder, single episode, mild: Secondary | ICD-10-CM

## 2019-02-20 NOTE — Patient Instructions (Signed)
Consider Eugenia Pancoast [phone].
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
## 2019-02-22 DIAGNOSIS — I2510 Atherosclerotic heart disease of native coronary artery without angina pectoris: Secondary | ICD-10-CM | POA: Insufficient documentation

## 2019-02-23 ENCOUNTER — Telehealth: Payer: Self-pay | Admitting: Family Medicine

## 2019-02-23 NOTE — Telephone Encounter (Signed)
Patient was advised.  

## 2019-02-23 NOTE — Telephone Encounter (Signed)
Jeff [name] at [phone]

## 2019-02-23 NOTE — Telephone Encounter (Signed)
Please review
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
## 2019-03-02 ENCOUNTER — Other Ambulatory Visit: Payer: Self-pay | Admitting: Family Medicine

## 2019-03-02 DIAGNOSIS — E118 Type 2 diabetes mellitus with unspecified complications: Secondary | ICD-10-CM

## 2019-04-17 ENCOUNTER — Encounter: Payer: Self-pay | Admitting: Podiatry

## 2019-04-17 ENCOUNTER — Ambulatory Visit: Payer: 59 | Admitting: Podiatry

## 2019-04-17 ENCOUNTER — Other Ambulatory Visit: Payer: Self-pay

## 2019-04-17 DIAGNOSIS — L98491 Non-pressure chronic ulcer of skin of other sites limited to breakdown of skin: Secondary | ICD-10-CM | POA: Diagnosis not present

## 2019-04-17 DIAGNOSIS — L089 Local infection of the skin and subcutaneous tissue, unspecified: Secondary | ICD-10-CM

## 2019-04-17 DIAGNOSIS — L98499 Non-pressure chronic ulcer of skin of other sites with unspecified severity: Secondary | ICD-10-CM | POA: Insufficient documentation

## 2019-04-17 MED ORDER — DOXYCYCLINE HYCLATE 100 MG PO TABS: 100.0000 mg | ORAL_TABLET | Freq: Two times a day (BID) | ORAL | 0 refills | Status: DC

## 2019-04-17 NOTE — Progress Notes (Signed)
This patient presents the office with chief complaint of a red swollen infected fourth toe right foot.  Patient states that he woke up 4 days ago and that his fourth toe was red swollen and painful.  He made an appointment last Friday which was scheduled for today.  He says that he has been using Neosporin and peroxide at the site of the infected fourth toe right foot.  He denies any history of trauma or injury to the fourth toe.  He previously had his fifth toe amputated by Dr. Amalia Hailey due to an infection in the fifth toe right foot.  Patient presents the office today wearing no bandage and his foot wear.  He presents the office today for an evaluation and treatment of this fourth toe right foot.  Vascular  Dorsalis pedis and posterior tibial pulses are palpable  B/L.  Capillary return  WNL.  Temperature gradient is  WNL.  Skin turgor  WNL. Hair present on digits.   Sensorium  [employer] monofilament wire absent.  Absent ttactile sensation.  Nail Exam  Patient has normal nails with no evidence of bacterial or fungal infection.  Orthopedic  Exam  Muscle tone and muscle strength  WNL.  No limitations of motion feet  B/L.  No crepitus or joint effusion noted.  Foot type is unremarkable and digits show no abnormalities.  Bony prominences are unremarkable.  Amputation fifth toe right foot.    Skin    Normal skin texture and turgor.  There is a small ulcer noted in fourth nail bed with mild redness and swelling fourth toe right foot.  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  ROV.  Discussed this condition with this patient.  The patient says that his toe is already improving and he is not having much pain or discomfort or drainage at this time.  Examination of the fourth toe does reveal the absence of the distal aspect of the fourth toenail noted with a small ulceration on the lateral aspect of the nailbed.  Patient was told he needs to soak his foot and/or peroxide his foot daily.  Patient to  apply dry sterile dressing to the fourth toe.  Prescribed doxycycline No. 21 twice daily until completed.  Return to clinic as needed.  The toe appears to be healing at this point and he was told to call the office if this condition worsens   Gardiner Barefoot DPM .

## 2019-05-10 ENCOUNTER — Other Ambulatory Visit: Payer: Self-pay

## 2019-05-10 ENCOUNTER — Encounter: Payer: Self-pay | Admitting: Family Medicine

## 2019-05-10 ENCOUNTER — Ambulatory Visit: Payer: 59 | Admitting: Family Medicine

## 2019-05-10 VITALS — BP 138/82 | HR 84 | Temp 97.9°F | Resp 16 | Ht 68.0 in | Wt 220.0 lb

## 2019-05-10 DIAGNOSIS — E118 Type 2 diabetes mellitus with unspecified complications: Secondary | ICD-10-CM

## 2019-05-10 DIAGNOSIS — R5382 Chronic fatigue, unspecified: Secondary | ICD-10-CM | POA: Diagnosis not present

## 2019-05-10 DIAGNOSIS — M5030 Other cervical disc degeneration, unspecified cervical region: Secondary | ICD-10-CM

## 2019-05-10 DIAGNOSIS — M5136 Other intervertebral disc degeneration, lumbar region: Secondary | ICD-10-CM | POA: Diagnosis not present

## 2019-05-10 IMAGING — CT CT CERVICAL SPINE W/ CM
3 of 4 series · 13 of 35 positions shown, 16 images · IV contrast (isovue)
Comparison: Cervical myelogram 05/08/2016

CLINICAL DATA: Chronic neck pain. Cervicalgia. Radiculopathy,
cervical. Previous cervical spine fusion.

EXAM:
CT MYELOGRAPHY CERVICAL SPINE
TECHNIQUE: CT imaging of the cervical spine was performed after Isovue 300 M
contrast administration. Multiplanar CT image reconstructions were
also generated.

[Series 9: sagittal st · sagittal · 0.30mm/px · 5 of 77 slices shown, 6 images]
[im 26/77  bone]
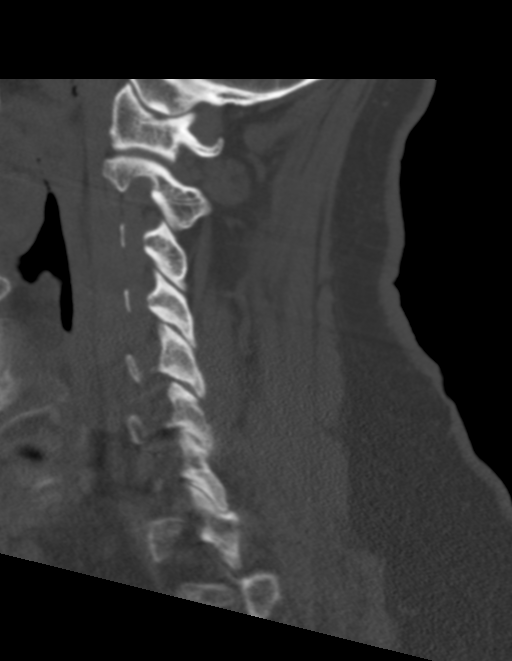
[im 32/77  bone]
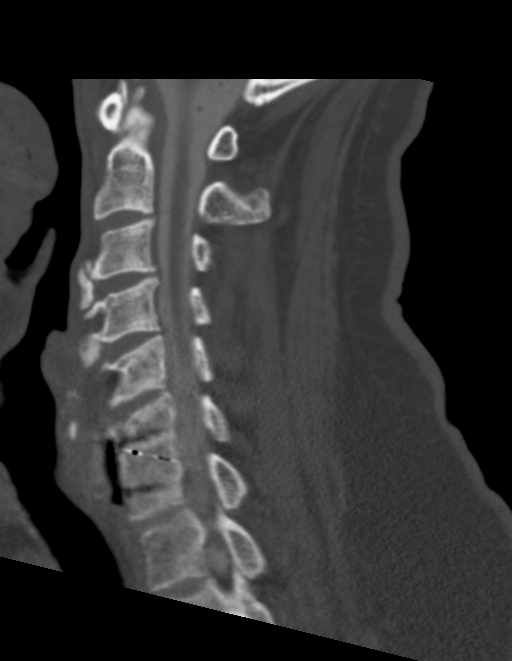
[im 39/77  soft-tissue]
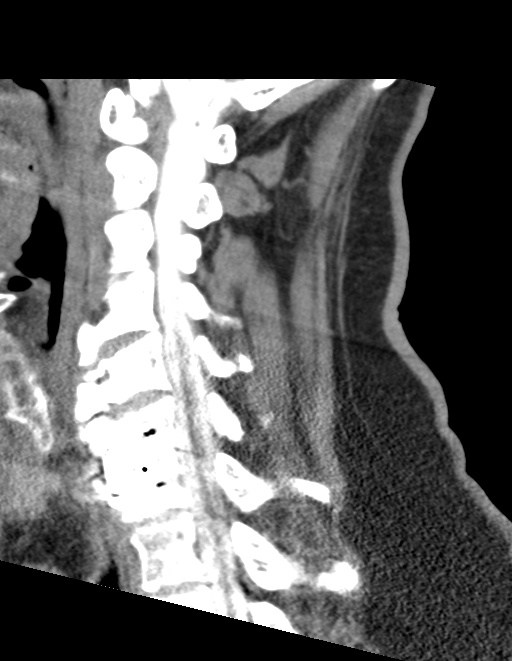
[im 39/77  bone]
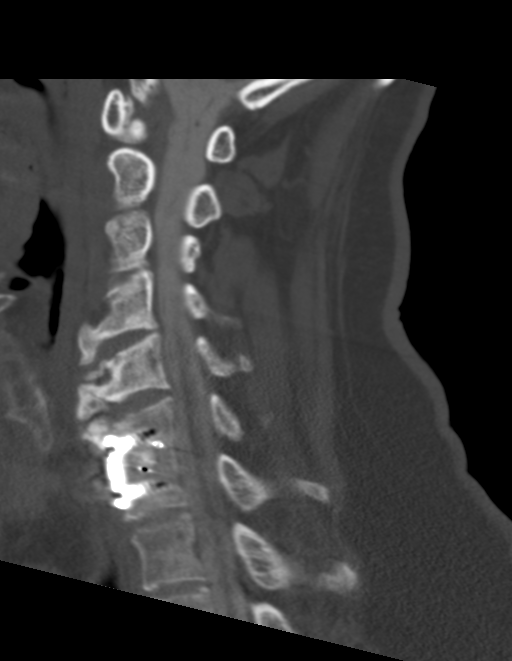
[im 45/77  bone]
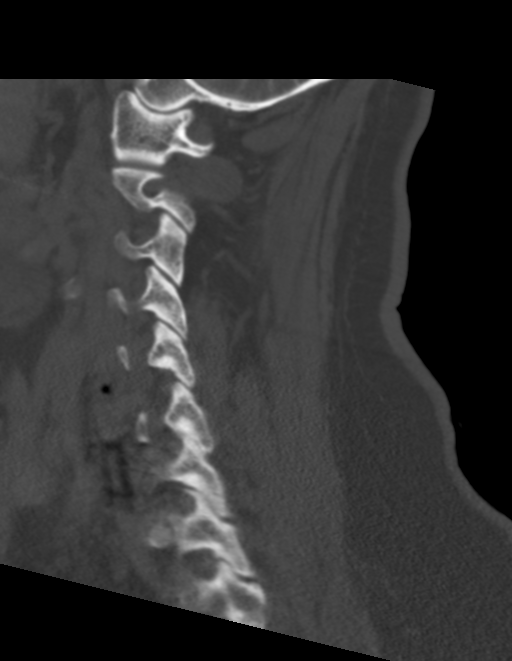
[im 51/77  bone]
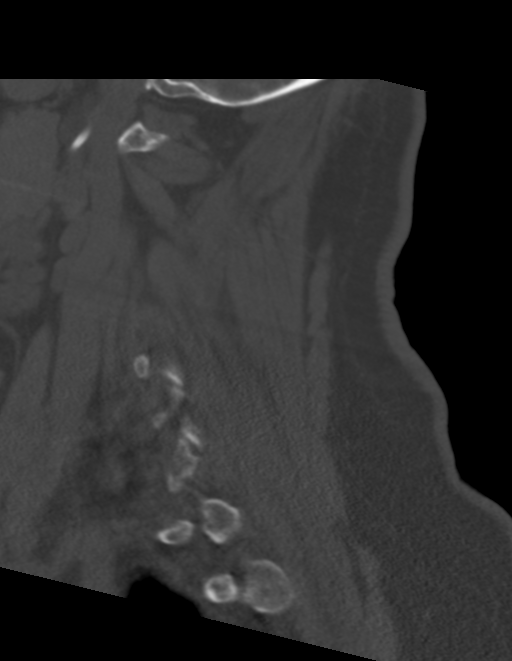

[Series 10: coronal st · coronal · 0.30mm/px · 3 of 55 slices shown]
[im 11/55  bone]
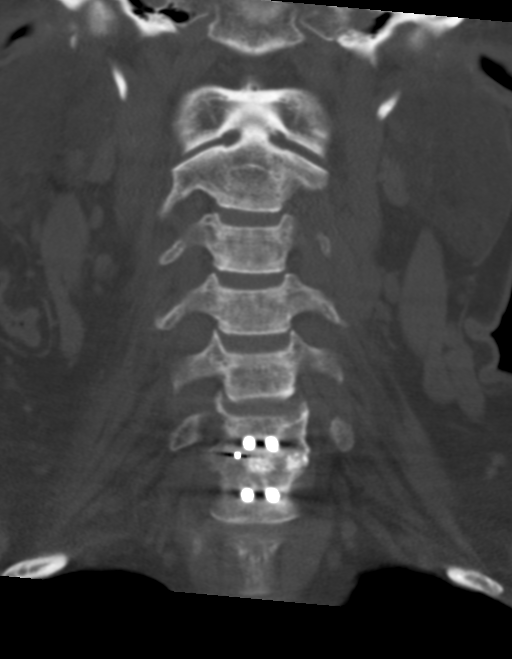
[im 22/55  bone]
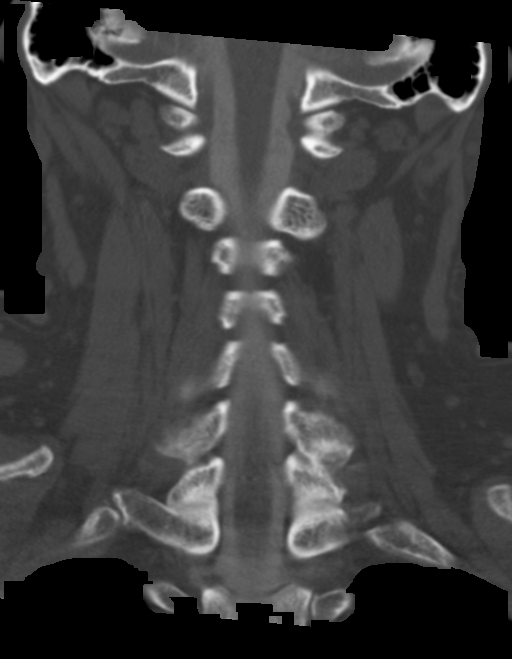
[im 33/55  bone]
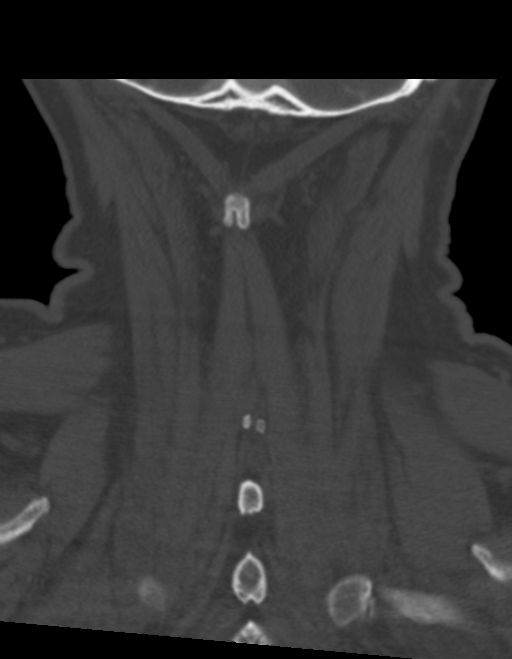

[Series 11: orthogonal st · axial · 0.30mm/px · z∈[-367,-244]mm · 5 of 95 slices shown, 7 images]
[im 16/95  soft-tissue]
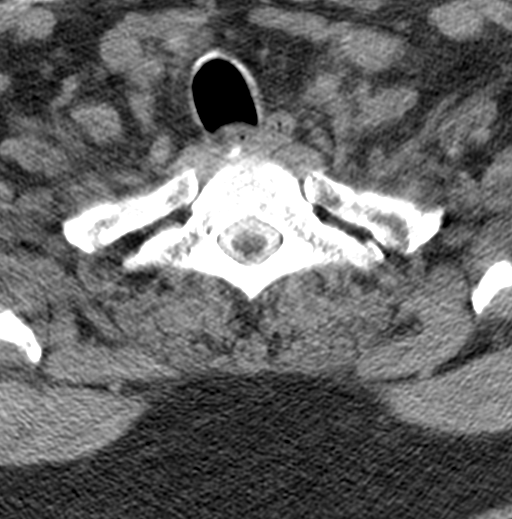
[im 16/95  bone]
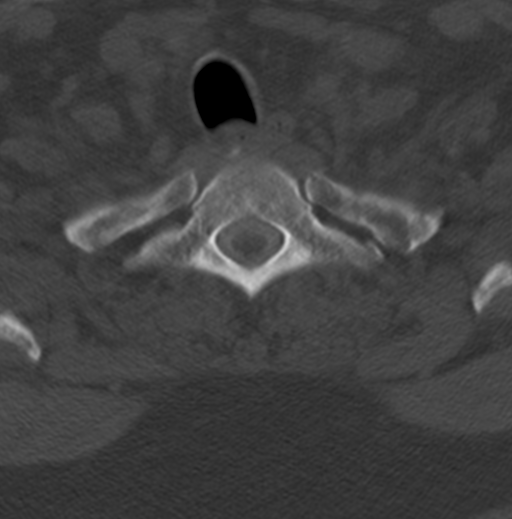
[im 32/95  bone]
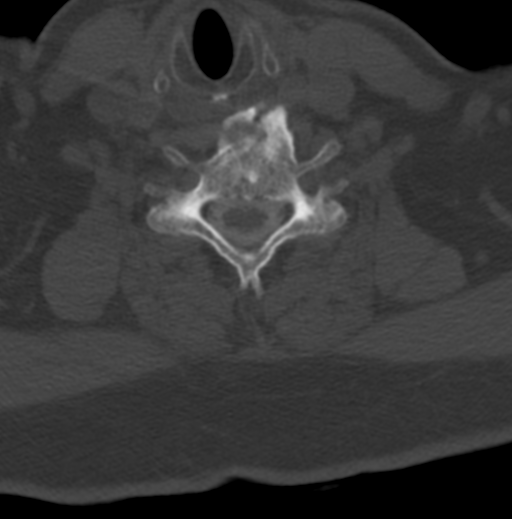
[im 48/95  bone]
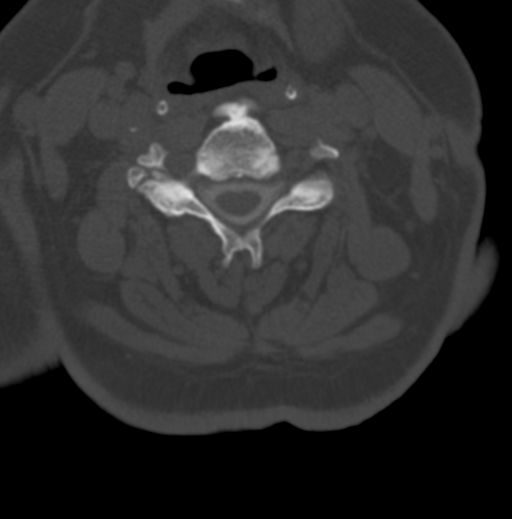
[im 63/95  bone]
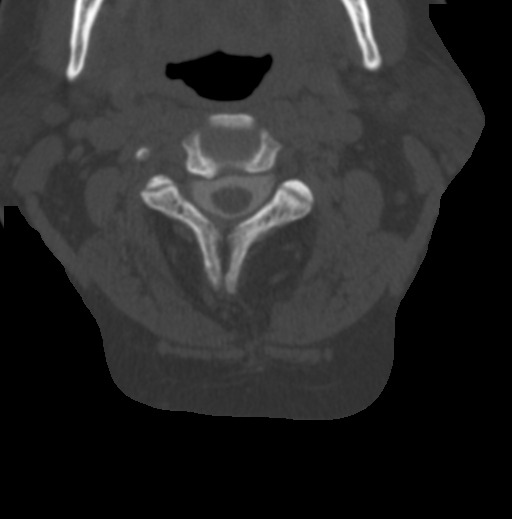
[im 79/95  soft-tissue]
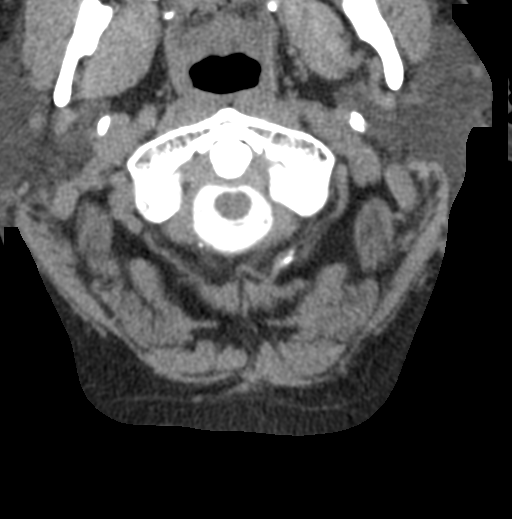
[im 79/95  bone]
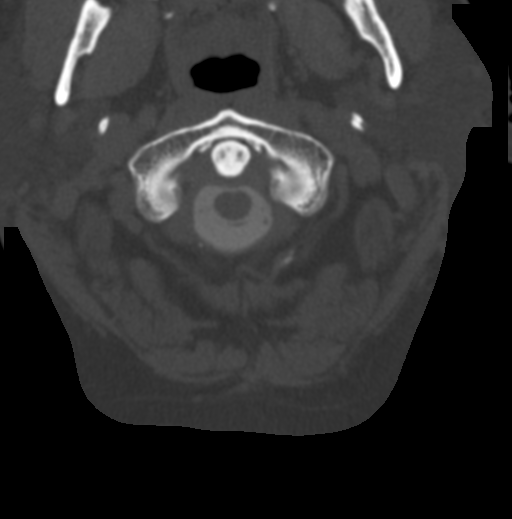

[13 of 35 positions shown; findings below may reference images not displayed]

FINDINGS: Contrast opacification of the subarachnoid space is excellent.
Injection was performed by Dr. Nickolas Wendt and will be dictated
separately.

Alignment: AP alignment is anatomic.

Vertebrae: Vertebral body heights are maintained. No acute or
healing fractures are present.

Cord: No significant cord distortion is evident.

Posterior Fossa and paraspinal tissues: The craniocervical junction
is within normal limits.

Disc levels:

C2-3: Mild uncovertebral spurring is present bilaterally without
significant stenosis.

C3-4: Mild uncovertebral spurring is present bilaterally without
significant stenosis or interval change.

C4-5: A mild broad-based disc osteophyte complex and bilateral
uncovertebral spurring is present. There is no significant focal
stenosis or change.

C5-6: A leftward disc osteophyte complex and asymmetric left-sided
uncovertebral spurring is present. There is no significant focal
stenosis or change.

C6-7: Solid anterior fusion is again noted. No residual or recurrent
stenosis is present.

C7-T1: Moderate facet arthropathy is again noted bilaterally. No
significant disc protrusion or stenosis is present.
IMPRESSION: 1. Solid fusion at C6-7 without residual or recurrent stenosis.
2. Mild uncovertebral and facet disease through the remainder of the
cervical spine without focal stenosis or significant interval
change.
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
## 2019-05-12 ENCOUNTER — Other Ambulatory Visit: Payer: Self-pay | Admitting: Family Medicine

## 2019-05-15 ENCOUNTER — Telehealth: Payer: Self-pay

## 2019-05-15 NOTE — Telephone Encounter (Signed)
Tried calling patient to let him know that his form was ready for pick up. Unable to leave a VM due to mailbox being full. Will try again later.

## 2019-05-17 NOTE — Telephone Encounter (Signed)
Called and spoke with patient and let him know his Form was ready to be picked up. He gave verbal understanding.

## 2019-06-08 ENCOUNTER — Other Ambulatory Visit: Payer: Self-pay | Admitting: Family Medicine

## 2019-07-31 ENCOUNTER — Other Ambulatory Visit: Payer: Self-pay | Admitting: Family Medicine

## 2019-08-15 ENCOUNTER — Ambulatory Visit (INDEPENDENT_AMBULATORY_CARE_PROVIDER_SITE_OTHER): Payer: 59

## 2019-08-15 ENCOUNTER — Ambulatory Visit (INDEPENDENT_AMBULATORY_CARE_PROVIDER_SITE_OTHER): Payer: 59 | Admitting: Podiatry

## 2019-08-15 ENCOUNTER — Other Ambulatory Visit: Payer: Self-pay

## 2019-08-15 ENCOUNTER — Telehealth: Payer: Self-pay | Admitting: Podiatry

## 2019-08-15 DIAGNOSIS — E0843 Diabetes mellitus due to underlying condition with diabetic autonomic (poly)neuropathy: Secondary | ICD-10-CM

## 2019-08-15 DIAGNOSIS — M86171 Other acute osteomyelitis, right ankle and foot: Secondary | ICD-10-CM | POA: Diagnosis not present

## 2019-08-15 DIAGNOSIS — L089 Local infection of the skin and subcutaneous tissue, unspecified: Secondary | ICD-10-CM

## 2019-08-15 DIAGNOSIS — L97512 Non-pressure chronic ulcer of other part of right foot with fat layer exposed: Secondary | ICD-10-CM | POA: Diagnosis not present

## 2019-08-15 DIAGNOSIS — L98491 Non-pressure chronic ulcer of skin of other sites limited to breakdown of skin: Secondary | ICD-10-CM | POA: Diagnosis not present

## 2019-08-15 MED ORDER — GENTAMICIN SULFATE 0.1 % EX CREA: 1.0000 "application " | TOPICAL_CREAM | Freq: Two times a day (BID) | CUTANEOUS | 1 refills | Status: DC

## 2019-08-15 MED ORDER — DOXYCYCLINE HYCLATE 100 MG PO TABS: 100.0000 mg | ORAL_TABLET | Freq: Two times a day (BID) | ORAL | 1 refills | Status: DC

## 2019-08-15 NOTE — Patient Instructions (Signed)
Pre-Operative Instructions  Congratulations, you have decided to take an important step towards improving your quality of life.  You can be assured that the doctors and staff at [facility] will be with you every step of the way.  Here are some important things you should know:  1. Plan to be at the surgery center/hospital at least 1 (one) hour prior to your scheduled time, unless otherwise directed by the surgical center/hospital staff.  You must have a responsible adult accompany you, remain during the surgery and drive you home.  Make sure you have directions to the surgical center/hospital to ensure you arrive on time. 2. If you are having surgery at [facility] or [facility] hospitals, you will need a copy of your medical history and physical form from your family physician within one month prior to the date of surgery. We will give you a form for your primary physician to complete.  3. We make every effort to accommodate the date you request for surgery.  However, there are times where surgery dates or times have to be moved.  We will contact you as soon as possible if a change in schedule is required.   4. No aspirin/ibuprofen for one week before surgery.  If you are on aspirin, any non-steroidal anti-inflammatory medications (Mobic, Aleve, Ibuprofen) should not be taken seven (7) days prior to your surgery.  You make take Tylenol for pain prior to surgery.  5. Medications - If you are taking daily heart and blood pressure medications, seizure, reflux, allergy, asthma, anxiety, pain or diabetes medications, make sure you notify the surgery center/hospital before the day of surgery so they can tell you which medications you should take or avoid the day of surgery. 6. No food or drink after midnight the night before surgery unless directed otherwise by surgical center/hospital staff. 7. No alcoholic beverages 24-hours prior to surgery.  No smoking 24-hours prior or 24-hours after  surgery. 8. Wear loose pants or shorts. They should be loose enough to fit over bandages, boots, and casts. 9. Don't wear slip-on shoes. Sneakers are preferred. 10. Bring your boot with you to the surgery center/hospital.  Also bring crutches or a walker if your physician has prescribed it for you.  If you do not have this equipment, it will be provided for you after surgery. 11. If you have not been contacted by the surgery center/hospital by the day before your surgery, call to confirm the date and time of your surgery. 12. Leave-time from work may vary depending on the type of surgery you have.  Appropriate arrangements should be made prior to surgery with your employer. 13. Prescriptions will be provided immediately following surgery by your doctor.  Fill these as soon as possible after surgery and take the medication as directed. Pain medications will not be refilled on weekends and must be approved by the doctor. 14. Remove nail polish on the operative foot and avoid getting pedicures prior to surgery. 15. Wash the night before surgery.  The night before surgery wash the foot and leg well with water and the antibacterial soap provided. Be sure to pay special attention to beneath the toenails and in between the toes.  Wash for at least three (3) minutes. Rinse thoroughly with water and dry well with a towel.  Perform this wash unless told not to do so by your physician.  Enclosed: 1 Ice pack (please put in freezer the night before surgery)   1 Hibiclens skin cleaner     Pre-op instructions  If you have any questions regarding the instructions, please do not hesitate to call our office.  [place]: [address], [place], [place] [address] -- [phone]  [place]: [address] [place], [place] [address] -- [phone]  [place]: [address], [place], [place] [address] -- [phone]   Website: [email or web address] 

## 2019-08-15 NOTE — Telephone Encounter (Signed)
I'm calling to schedule sx with Dr. Amalia Hailey for removal of a toe. He wanted me to call you and get it scheduled as soon as possible. Thank you.

## 2019-08-16 ENCOUNTER — Telehealth: Payer: Self-pay | Admitting: *Deleted

## 2019-08-16 NOTE — Telephone Encounter (Signed)
"  I need to schedule surgery.  Dr. Amalia Hailey wanted me to call and get this appointment.  Give me a call back and let me know when we can schedule this appointment."  I attempted to call the patient.  I left him a message.  I offered him August 31, 2019.  I asked him to call me back.

## 2019-08-17 LAB — WOUND CULTURE

## 2019-08-18 NOTE — Progress Notes (Signed)
Subjective:  59 y.o. male with PMHx of type 2 diabetes mellitus presenting today with a chief complaint of an infected right fourth toe that began about 5 days ago. He reports associated redness, swelling and burning pain of the toe. He reports associated bleeding. He has been taking Ibuprofen and applying Neosporin for treatment. There are no aggravating factors noted. Patient is here for further evaluation and treatment.   Past Medical History:  Diagnosis Date  . Cancer ([place])    SKIN   . Depression   . Diabetes mellitus without complication ([place])    Type II  . Dyspnea    with exertion   . Fatigue   . Hyperlipidemia   . Hypertension   . Hypogonadism male   . Neuromuscular disorder ([place])    "back nerve stimulator"  . Neuropathy   . PAD (peripheral artery disease) ([place])   . Sleep apnea    CPAP  . Tinnitus   . Tuberculosis    POSITIVE  TB SKIN TEST 1992.6 MTH TX .was exposed to someone who had it.      Objective/Physical Exam General: The patient is alert and oriented x3 in no acute distress.  Dermatology:  Wound #1 noted to the right fourth toe measuring approximately 0.5 x 0.5 x 0.2 cm (LxWxD).   To the noted ulceration(s), there is no eschar. There is a moderate amount of slough, fibrin, and necrotic tissue noted. Granulation tissue and wound base is red. There is a minimal amount of serosanguineous drainage noted. There is no exposed bone muscle-tendon ligament or joint. There is no malodor. Periwound integrity is intact. Skin is warm, dry and supple bilateral lower extremities.  Vascular: Palpable pedal pulses bilaterally. No edema or erythema noted. Capillary refill within normal limits.  Neurological: Epicritic and protective threshold diminished bilaterally.   Musculoskeletal Exam: Range of motion within normal limits to all pedal and ankle joints bilateral. Muscle strength 5/5 in all groups bilateral.   Radiographic Exam: Bone necrosis and cortical destruction  noted to the distal phalanx of the right 4th toe.   Assessment: 1. Ulceration of the right 4th toe secondary to diabetes mellitus 2. Osteomyelitis right fourth toe 3. 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 Patient was evaluated. X-Rays reviewed.  2. medically necessary excisional debridement including subcutaneous tissue was performed using a tissue nipper and a chisel blade. Excisional debridement of all the necrotic nonviable tissue down to healthy bleeding viable tissue was performed with post-debridement measurements same as pre-. 3. the wound was cleansed and dry sterile dressing applied. 4. .Culture taken from wound.  5. Prescription for Doxycycline 100 mg provided to patient.  6. Post op shoe dispensed.  7. Today we discussed the conservative versus surgical management of the presenting pathology. The patient opts for surgical management. All possible complications and details of the procedure were explained. All patient questions were answered. No guarantees were expressed or implied. 8. Authorization for surgery was initiated today. Surgery will consist of 4th toe amputation right, hopefully at the IPJ.  9. Recommended using Betadine daily.  10. Return to clinic one week post op.    Edrick Kins, DPM [facility]  Dr. Edrick Kins, DPM    [place]  [place], [place] [address]                Office [phone]  Fax [phone]

## 2019-08-21 ENCOUNTER — Telehealth: Payer: Self-pay | Admitting: Podiatry

## 2019-08-21 NOTE — Telephone Encounter (Signed)
I'm calling to schedule sx with Dr. Amalia Hailey. I need to get is scheduled as soon as possible. Please call me back. Thank you.

## 2019-08-21 NOTE — Telephone Encounter (Signed)
Left voicemail letting pt know I would have Delydia follow up with the sx center to see if we could get him scheduled possibly as a sx case one day. Stated they were closed today in observance of the holiday. Told him to call with any other questions.

## 2019-08-24 NOTE — Telephone Encounter (Signed)
"  I was returning your call about scheduling my surgery for January 28.  That date will be fine if you still have that available.  Give me a call back to confirm the appointment."

## 2019-08-28 NOTE — Telephone Encounter (Signed)
"  I am calling about the January 28 time at the hospital to get by foot worked on by Dr. Amalia Hailey.  The 28th is fine with me.  Give me a call back and let me know the time to be there."   I am returning your call.  Someone from the surgical center will give you a call a day or two prior to your surgical date and they will give you your arrival time.  If you need to know sooner, you can give them a call.  The phone number to call is [phone] and the number can also be found on the back of the brochure that we gave you.  Please call if you have any other questions.

## 2019-08-29 ENCOUNTER — Telehealth: Payer: Self-pay | Admitting: *Deleted

## 2019-08-29 NOTE — Telephone Encounter (Signed)
DOS 08/31/2019 AMPUTATION TOE MPJ JOINT 4TH RIGHT FOOT - 20094  [employer]HC: Eligibility Date - 08/04/2019 - 08/02/2020  Plan Deductible Per Calendar Year $0.00 of $500.00 Met  Remaining: $500.00  Out-of-Pocket Maximum Per Calendar Year $294.98 of $5,000.00 Met  Remaining: $4,705.02   Co-Insurance 20%  Copay $150.00 / visit   This [employer] plan does not currently require a prior authorization for these services. If you have general questions about the prior authorization requirements, please call us at [phone] or visit [email or web address] > Clinician Resources > Advance and Admission Notification Requirements. The number above acknowledges your notification. Please write this number down for future reference. Notification is not a guarantee of coverage or payment.  Decision ID #:[id number]

## 2019-08-31 ENCOUNTER — Encounter: Payer: Self-pay | Admitting: Podiatry

## 2019-08-31 ENCOUNTER — Other Ambulatory Visit: Payer: Self-pay | Admitting: Podiatry

## 2019-08-31 DIAGNOSIS — M86671 Other chronic osteomyelitis, right ankle and foot: Secondary | ICD-10-CM | POA: Diagnosis not present

## 2019-08-31 MED ORDER — OXYCODONE-ACETAMINOPHEN 5-325 MG PO TABS: 1.0000 | ORAL_TABLET | Freq: Four times a day (QID) | ORAL | 0 refills | Status: DC | PRN

## 2019-08-31 MED ORDER — DOXYCYCLINE HYCLATE 100 MG PO TABS: 100.0000 mg | ORAL_TABLET | Freq: Two times a day (BID) | ORAL | 1 refills | Status: DC

## 2019-08-31 NOTE — Progress Notes (Signed)
PRN postop 

## 2019-09-04 ENCOUNTER — Other Ambulatory Visit: Payer: Self-pay | Admitting: Family Medicine

## 2019-09-04 DIAGNOSIS — R0602 Shortness of breath: Secondary | ICD-10-CM

## 2019-09-04 NOTE — Telephone Encounter (Signed)
Requested Prescriptions  Pending Prescriptions Disp Refills  . venlafaxine (EFFEXOR) 75 MG tablet [Pharmacy Med Name: VENLAFAXINE HCL 75 MG TABLET] 90 tablet 11    Sig: TAKE 3 TABLETS (225 MG TOTAL) BY MOUTH DAILY.     Psychiatry: Antidepressants - SNRI - desvenlafaxine & venlafaxine Failed - 09/04/2019  1:09 AM      Failed - LDL in normal range and within 360 days    LDL Cholesterol (Calc)  Date Value Ref Range Status  05/05/2017 69 mg/dL (calc) Final    Comment:    Reference range: <100 . Desirable range <100 mg/dL for primary prevention;   <70 mg/dL for patients with CHD or diabetic patients  with > or = 2 CHD risk factors. Marland Kitchen LDL-C is now calculated using the Martin-Hopkins  calculation, which is a validated novel method providing  better accuracy than the Friedewald equation in the  estimation of LDL-C.  Cresenciano Genre et al. Annamaria Helling. [id number]): 2061-2068  (http://education.[facility].com/faq/FAQ164)    LDL Calculated  Date Value Ref Range Status  08/18/2018 64 0 - 99 mg/dL Final         Failed - Total Cholesterol in normal range and within 360 days    Cholesterol, Total  Date Value Ref Range Status  08/18/2018 136 100 - 199 mg/dL Final         Failed - Triglycerides in normal range and within 360 days    Triglycerides  Date Value Ref Range Status  08/18/2018 242 (H) 0 - 149 mg/dL Final         Failed - Completed PHQ-2 or PHQ-9 in the last 360 days.      Passed - Last BP in normal range    BP Readings from Last 1 Encounters:  05/10/19 138/82         Passed - Valid encounter within last 6 months    Recent Outpatient Visits          3 months ago DDD (degenerative disc disease), cervical   [facility] Jerrol Banana., MD   6 months ago Cervical strain, acute, initial encounter   [facility] Jerrol Banana., MD   1 year ago Type 2 diabetes mellitus with diabetic polyneuropathy, without long-term current use of insulin  [facility])   [facility] Jerrol Banana., MD   1 year ago Essential (primary) hypertension   [facility] Jerrol Banana., MD   1 year ago Pre-op evaluation   [facility] Jerrol Banana., MD

## 2019-09-05 ENCOUNTER — Other Ambulatory Visit: Payer: Self-pay | Admitting: Family Medicine

## 2019-09-05 DIAGNOSIS — F320 Major depressive disorder, single episode, mild: Secondary | ICD-10-CM

## 2019-09-05 DIAGNOSIS — I10 Essential (primary) hypertension: Secondary | ICD-10-CM

## 2019-09-05 NOTE — Telephone Encounter (Signed)
Requested Prescriptions  Pending Prescriptions Disp Refills  . atorvastatin (LIPITOR) 10 MG tablet [Pharmacy Med Name: ATORVASTATIN 10 MG TABLET] 30 tablet 11    Sig: TAKE 1 TABLET BY MOUTH EVERYDAY AT BEDTIME     Cardiovascular:  Antilipid - Statins Failed - 09/05/2019  1:04 AM      Failed - Total Cholesterol in normal range and within 360 days    Cholesterol, Total  Date Value Ref Range Status  08/18/2018 136 100 - 199 mg/dL Final         Failed - LDL in normal range and within 360 days    LDL Cholesterol (Calc)  Date Value Ref Range Status  05/05/2017 69 mg/dL (calc) Final    Comment:    Reference range: <100 . Desirable range <100 mg/dL for primary prevention;   <70 mg/dL for patients with CHD or diabetic patients  with > or = 2 CHD risk factors. Marland Kitchen LDL-C is now calculated using the Martin-Hopkins  calculation, which is a validated novel method providing  better accuracy than the Friedewald equation in the  estimation of LDL-C.  Cresenciano Genre et al. Annamaria Helling. [id number]): 2061-2068  (http://education.[facility].com/faq/FAQ164)    LDL Calculated  Date Value Ref Range Status  08/18/2018 64 0 - 99 mg/dL Final         Failed - HDL in normal range and within 360 days    HDL  Date Value Ref Range Status  08/18/2018 24 (L) >39 mg/dL Final         Failed - Triglycerides in normal range and within 360 days    Triglycerides  Date Value Ref Range Status  08/18/2018 242 (H) 0 - 149 mg/dL Final         Passed - Patient is not pregnant      Passed - Valid encounter within last 12 months    Recent Outpatient Visits          3 months ago DDD (degenerative disc disease), cervical   [facility] Jerrol Banana., MD   6 months ago Cervical strain, acute, initial encounter   [facility] Jerrol Banana., MD   1 year ago Type 2 diabetes mellitus with diabetic polyneuropathy, without long-term current use of insulin [facility])   [facility] Jerrol Banana., MD   1 year ago Essential (primary) hypertension   [facility] Jerrol Banana., MD   1 year ago Pre-op evaluation   [facility] Jerrol Banana., MD             . metoprolol succinate (TOPROL-XL) 50 MG 24 hr tablet [Pharmacy Med Name: METOPROLOL SUCC ER 50 MG TAB] 30 tablet 11    Sig: TAKE 1 TABLET BY MOUTH EVERY DAY     Cardiovascular:  Beta Blockers Passed - 09/05/2019  1:04 AM      Passed - Last BP in normal range    BP Readings from Last 1 Encounters:  05/10/19 138/82         Passed - Last Heart Rate in normal range    Pulse Readings from Last 1 Encounters:  05/10/19 84         Passed - Valid encounter within last 6 months    Recent Outpatient Visits          3 months ago DDD (degenerative disc disease), cervical   [facility] Jerrol Ban[facility]., MD   6 months ago  Cervical strain, acute, initial encounter   [facility] Jerrol Banana., MD   1 year ago Type 2 diabetes mellitus with diabetic polyneuropathy, without long-term current use of insulin [facility])   [facility] Jerrol Banana., MD   1 year ago Essential (primary) hypertension   [facility] Jerrol Banana., MD   1 year ago Pre-op evaluation   [facility] Jerrol Banana., MD             . ARIPiprazole (ABILIFY) 5 MG tablet [Pharmacy Med Name: ARIPIPRAZOLE 5 MG TABLET] 30 tablet 11    Sig: TAKE 1 TABLET BY MOUTH EVERY DAY     Not Delegated - Psychiatry:  Antipsychotics - Second Generation (Atypical) - aripiprazole Failed - 09/05/2019  1:04 AM      Failed - This refill cannot be delegated      Passed - Valid encounter within last 6 months    Recent Outpatient Visits          3 months ago DDD (degenerative disc disease), cervical   [facility] Jerrol Banana., MD   6 months ago Cervical strain, acute,  initial encounter   [facility] Jerrol Banana., MD   1 year ago Type 2 diabetes mellitus with diabetic polyneuropathy, without long-term current use of insulin [facility])   [facility] Jer[facility] MD   1 year ago Essential (primary) hypertension   [facility] Jerrol Banana., MD   1 year ago Pre-op evaluation   [facility] Jerrol Banana., MD

## 2019-09-05 NOTE — Telephone Encounter (Signed)
Requested medication (s) are due for refill today: yes  Requested medication (s) are on the active medication list: yes  Last refill:  08/10/19  Future visit scheduled: no   Notes to clinic:  not delegated   Requested Prescriptions  Pending Prescriptions Disp Refills   ARIPiprazole (ABILIFY) 5 MG tablet [Pharmacy Med Name: ARIPIPRAZOLE 5 MG TABLET] 30 tablet 11    Sig: TAKE 1 TABLET BY MOUTH EVERY DAY      Not Delegated - Psychiatry:  Antipsychotics - Second Generation (Atypical) - aripiprazole Failed - 09/05/2019  1:04 AM      Failed - This refill cannot be delegated      Passed - Valid encounter within last 6 months    Recent Outpatient Visits           3 months ago DDD (degenerative disc disease), cervical   [facility] Jerrol Banana., MD   6 months ago Cervical strain, acute, initial encounter   [facility] Jerrol Banana., MD   1 year ago Type 2 diabetes mellitus with diabetic polyneuropathy, without long-term current use of insulin [facility])   [facility] Jerrol Banana., MD   1 year ago Essential (primary) hypertension   [facility] Jerrol Banana., MD   1 year ago Pre-op evaluation   [facility] Jerrol Banana., MD               Signed Prescriptions Disp Refills   atorvastatin (LIPITOR) 10 MG tablet 30 tablet 11    Sig: TAKE 1 TABLET BY MOUTH EVERYDAY AT BEDTIME      Cardiovascular:  Antilipid - Statins Failed - 09/05/2019  1:04 AM      Failed - Total Cholesterol in normal range and within 360 days    Cholesterol, Total  Date Value Ref Range Status  08/18/2018 136 100 - 199 mg/dL Final          Failed - LDL in normal range and within 360 days    LDL Cholesterol (Calc)  Date Value Ref Range Status  05/05/2017 69 mg/dL (calc) Final    Comment:    Reference range: <100 . Desirable range <100 mg/dL for primary prevention;   <70 mg/dL for patients with  CHD or diabetic patients  with > or = 2 CHD risk factors. Marland Kitchen LDL-C is now calculated using the Martin-Hopkins  calculation, which is a validated novel method providing  better accuracy than the Friedewald equation in the  estimation of LDL-C.  Cresenciano Genre et al. Annamaria Helling. [id number]): 2061-2068  (http://education.[facility].com/faq/FAQ164)    LDL Calculated  Date Value Ref Range Status  08/18/2018 64 0 - 99 mg/dL Final          Failed - HDL in normal range and within 360 days    HDL  Date Value Ref Range Status  08/18/2018 24 (L) >39 mg/dL Final          Failed - Triglycerides in normal range and within 360 days    Triglycerides  Date Value Ref Range Status  08/18/2018 242 (H) 0 - 149 mg/dL Final          Passed - Patient is not pregnant      Passed - Valid encounter within last 12 months    Recent Outpatient Visits           3 months ago DDD (degenerative disc disease), cervical   [facility] Miguel Aschoff  Kaylyn Lim., MD   6 months ago Cervical strain, acute, initial encounter   [facility] Jerrol Banana., MD   1 year ago Type 2 diabetes mellitus with diabetic polyneuropathy, without long-term current use of insulin [facility])   [facility] Jerrol Banana., MD   1 year ago Essential (primary) hypertension   [facility] Jerrol Banana., MD   1 year ago Pre-op evaluation   [facility] Jerrol Banana., MD                metoprolol succinate (TOPROL-XL) 50 MG 24 hr tablet 30 tablet 11    Sig: TAKE 1 TABLET BY MOUTH EVERY DAY      Cardiovascular:  Beta Blockers Passed - 09/05/2019  1:04 AM      Passed - Last BP in normal range    BP Readings from Last 1 Encounters:  05/10/19 138/82          Passed - Last Heart Rate in normal range    Pulse Readings from Last 1 Encounters:  05/10/19 84          Passed - Valid encounter within last 6 months    Recent  Outpatient Visits           3 months ago DDD (degenerative disc disease), cervical   [facility] Jerrol Banana., MD   6 months ago Cervical strain, acute, initial encounter   [facility] Jerrol Banana., MD   1 year ago Type 2 diabetes mellitus with diabetic polyneuropathy, without long-term current use of insulin [facility])   [facility] Jerrol Banana., MD   1 year ago Essential (primary) hypertension   [facility] Jerrol Banana., MD   1 year ago Pre-op evaluation   [facility] Jerrol Banana., MD

## 2019-09-08 ENCOUNTER — Other Ambulatory Visit: Payer: Self-pay

## 2019-09-08 ENCOUNTER — Ambulatory Visit (INDEPENDENT_AMBULATORY_CARE_PROVIDER_SITE_OTHER): Payer: 59

## 2019-09-08 ENCOUNTER — Encounter: Payer: Self-pay | Admitting: *Deleted

## 2019-09-08 ENCOUNTER — Ambulatory Visit (INDEPENDENT_AMBULATORY_CARE_PROVIDER_SITE_OTHER): Payer: 59 | Admitting: Podiatry

## 2019-09-08 DIAGNOSIS — E0843 Diabetes mellitus due to underlying condition with diabetic autonomic (poly)neuropathy: Secondary | ICD-10-CM | POA: Diagnosis not present

## 2019-09-08 DIAGNOSIS — Z9889 Other specified postprocedural states: Secondary | ICD-10-CM

## 2019-09-08 DIAGNOSIS — M86171 Other acute osteomyelitis, right ankle and foot: Secondary | ICD-10-CM

## 2019-09-08 DIAGNOSIS — L97522 Non-pressure chronic ulcer of other part of left foot with fat layer exposed: Secondary | ICD-10-CM | POA: Diagnosis not present

## 2019-09-08 MED ORDER — DOXYCYCLINE HYCLATE 100 MG PO TABS: 100.0000 mg | ORAL_TABLET | Freq: Two times a day (BID) | ORAL | 1 refills | Status: DC

## 2019-09-11 NOTE — Progress Notes (Signed)
   Subjective:  Patient presents today status post right fourth toe amputation. DOS: 08/31/2019. He reports some moderate soreness of the area. He reports associated redness and minor swelling.  He also reports a new complaint of sharp pain to the ball of the left foot that began about one week ago. He states he had a "water blister" present that popped and now there is a wound. He reports associated swelling around the area. Walking aggravates the symptoms. He has been applying Gentamicin cream to the area for treatment. Patient is here for further evaluation and treatment.   Past Medical History:  Diagnosis Date  . Cancer ([place])    SKIN   . Depression   . Diabetes mellitus without complication ([place])    Type II  . Dyspnea    with exertion   . Fatigue   . Hyperlipidemia   . Hypertension   . Hypogonadism male   . Neuromuscular disorder ([place])    "back nerve stimulator"  . Neuropathy   . PAD (peripheral artery disease) ([place])   . Sleep apnea    CPAP  . Tinnitus   . Tuberculosis    POSITIVE  TB SKIN TEST 1992.6 MTH TX .was exposed to someone who had it.      Objective/Physical Exam Neurovascular status intact.  Skin incisions appear to be well coapted with sutures and staples intact. No sign of infectious process noted. No dehiscence. No active bleeding noted. Moderate edema noted to the surgical extremity. Wound noted to the left plantar forefoot measuring approximately 4.0 x 2.5 x 0.2 cm.   To the above-noted ulceration, there is no eschar. There is a moderate amount of slough, fibrin and necrotic tissue. Granulation tissue and wound base is red. There is no malodor. There is a minimal amount of serosanginous drainage noted. Periwound integrity is intact.   Radiographic Exam:  Orthopedic hardware and osteotomies sites appear to be stable with routine healing.  Assessment: 1. s/p right fourth toe amputation. DOS: 08/31/2019 2. Ulceration of the left plantar forefoot secondary to  diabetes mellitus    Plan of Care:  1. Patient was evaluated. X-rays reviewed 2. Medically necessary excisional debridement including subcutaneous tissue was performed using a tissue nipper and a chisel blade. Excisional debridement of all the necrotic nonviable tissue down to healthy bleeding viable tissue was performed with post-debridement measurements same as pre-. 3. The wound was cleansed and dry sterile dressing applied. 4. Refill prescription for Doxycycline 100 mg #20 provided to patient.  5. Dry sterile dressing applied bilaterally.  6. Post op shoes dispensed bilaterally.  7. Return to clinic in one week.    Edrick Kins, DPM [facility]  Dr. Edrick Kins, [place]                                        [place], [place] [address]                Office [phone]  Fax [phone]

## 2019-09-15 ENCOUNTER — Other Ambulatory Visit: Payer: Self-pay | Admitting: Family Medicine

## 2019-09-15 ENCOUNTER — Other Ambulatory Visit: Payer: Self-pay

## 2019-09-15 ENCOUNTER — Encounter: Payer: Self-pay | Admitting: Podiatry

## 2019-09-15 ENCOUNTER — Ambulatory Visit (INDEPENDENT_AMBULATORY_CARE_PROVIDER_SITE_OTHER): Payer: 59 | Admitting: Podiatry

## 2019-09-15 DIAGNOSIS — L97522 Non-pressure chronic ulcer of other part of left foot with fat layer exposed: Secondary | ICD-10-CM | POA: Diagnosis not present

## 2019-09-15 DIAGNOSIS — Z9889 Other specified postprocedural states: Secondary | ICD-10-CM

## 2019-09-15 DIAGNOSIS — E0843 Diabetes mellitus due to underlying condition with diabetic autonomic (poly)neuropathy: Secondary | ICD-10-CM

## 2019-09-15 DIAGNOSIS — N529 Male erectile dysfunction, unspecified: Secondary | ICD-10-CM

## 2019-09-19 NOTE — Progress Notes (Signed)
   Subjective:  Patient presents today status post right fourth toe amputation. DOS: 08/31/2019. He is also here for follow up evaluation of an ulceration of the left plantar forefoot. He states he is doing well. He reports minimal pain. He denies worsening factors. He has been using the post op shoes and taking Doxycycline as directed. Patient is here for further evaluation and treatment.   Past Medical History:  Diagnosis Date  . Cancer ([place])    SKIN   . Depression   . Diabetes mellitus without complication ([place])    Type II  . Dyspnea    with exertion   . Fatigue   . Hyperlipidemia   . Hypertension   . Hypogonadism male   . Neuromuscular disorder ([place])    "back nerve stimulator"  . Neuropathy   . PAD (peripheral artery disease) ([place])   . Sleep apnea    CPAP  . Tinnitus   . Tuberculosis    POSITIVE  TB SKIN TEST 1992.6 MTH TX .was exposed to someone who had it.      Objective: Neurovascular status intact.  Skin incisions appear to be well coapted and healed. No sign of infectious process noted. No dehiscence. No active bleeding noted. Moderate edema noted to the surgical extremity. Wound noted to the left plantar forefoot measuring approximately 3.5 x 2.5 x 0.2 cm.   To the above-noted ulceration, there is no eschar. There is a moderate amount of slough, fibrin and necrotic tissue. Granulation tissue and wound base is red. There is no malodor. There is a minimal amount of serosanginous drainage noted. Periwound integrity is intact.     Assessment: 1. s/p right fourth toe amputation. DOS: 08/31/2019 - healed  2. Ulceration of the left plantar forefoot secondary to diabetes mellitus    Plan of Care:  1. Patient was evaluated.  2. Medically necessary excisional debridement including subcutaneous tissue was performed using a tissue nipper and a chisel blade. Excisional debridement of all the necrotic nonviable tissue down to healthy bleeding viable tissue was performed with  post-debridement measurements same as pre-. 3. The wound was cleansed and dry sterile dressing applied. 4. Sutures removed.  5. Finish oral Doxycycline 100 mg.  6. Continue using post op shoe.  7. Return to clinic in 2 weeks.    Edrick Kins, DPM [facility]  Dr. Edrick Kins, [place]                                        [place], [place] [address]                Office [phone]  Fax [phone]

## 2019-09-29 ENCOUNTER — Encounter: Payer: 59 | Admitting: Podiatry

## 2019-10-06 ENCOUNTER — Ambulatory Visit (INDEPENDENT_AMBULATORY_CARE_PROVIDER_SITE_OTHER): Payer: 59

## 2019-10-06 ENCOUNTER — Ambulatory Visit (INDEPENDENT_AMBULATORY_CARE_PROVIDER_SITE_OTHER): Payer: 59 | Admitting: Podiatry

## 2019-10-06 ENCOUNTER — Other Ambulatory Visit: Payer: Self-pay

## 2019-10-06 ENCOUNTER — Encounter: Payer: Self-pay | Admitting: Podiatry

## 2019-10-06 DIAGNOSIS — E0843 Diabetes mellitus due to underlying condition with diabetic autonomic (poly)neuropathy: Secondary | ICD-10-CM

## 2019-10-06 DIAGNOSIS — Z89421 Acquired absence of other right toe(s): Secondary | ICD-10-CM

## 2019-10-06 DIAGNOSIS — L97522 Non-pressure chronic ulcer of other part of left foot with fat layer exposed: Secondary | ICD-10-CM | POA: Diagnosis not present

## 2019-10-06 DIAGNOSIS — Z9889 Other specified postprocedural states: Secondary | ICD-10-CM

## 2019-10-06 MED ORDER — DOXYCYCLINE HYCLATE 100 MG PO TABS: 100.0000 mg | ORAL_TABLET | Freq: Two times a day (BID) | ORAL | 1 refills | Status: DC

## 2019-10-06 MED ORDER — GENTAMICIN SULFATE 0.1 % EX CREA: 1.0000 "application " | TOPICAL_CREAM | Freq: Two times a day (BID) | CUTANEOUS | 1 refills | Status: DC

## 2019-10-09 NOTE — Progress Notes (Signed)
   Subjective:  Patient presents today status post right fourth toe amputation. DOS: 08/31/2019. He is also here for follow up evaluation of an ulceration of left plantar forefoot. He states he is doing well and the wound is improving. He reports associated mild drainage. He states he has finished the course of Doxycycline and has been using Gentamicin as directed. He denies any pain. Patient is here for further evaluation and treatment.   Past Medical History:  Diagnosis Date  . Cancer ([place])    SKIN   . Depression   . Diabetes mellitus without complication ([place])    Type II  . Dyspnea    with exertion   . Fatigue   . Hyperlipidemia   . Hypertension   . Hypogonadism male   . Neuromuscular disorder ([place])    "back nerve stimulator"  . Neuropathy   . PAD (peripheral artery disease) ([place])   . Sleep apnea    CPAP  . Tinnitus   . Tuberculosis    POSITIVE  TB SKIN TEST 1992.6 MTH TX .was exposed to someone who had it.      Objective: Neurovascular status intact.  Skin incisions appear to be well coapted and healed. No sign of infectious process noted. No dehiscence. No active bleeding noted. Moderate edema noted to the surgical extremity. Wound noted to the left plantar forefoot measuring approximately 2.0 x 3.0 x 0.2 cm.   To the above-noted ulceration, there is no eschar. There is a moderate amount of slough, fibrin and necrotic tissue. Granulation tissue and wound base is red. There is no malodor. There is a minimal amount of serosanginous drainage noted. Periwound integrity is intact.    Radiographic Exam:  Osteotomies sites appear to be stable with routine healing.  Assessment: 1. s/p right fourth toe amputation. DOS: 08/31/2019 - healed  2. Ulceration of the left plantar forefoot secondary to diabetes mellitus    Plan of Care:  1. Patient was evaluated. X-Rays reviewed.  2. Medically necessary excisional debridement including subcutaneous tissue was performed using a tissue  nipper and a chisel blade. Excisional debridement of all the necrotic nonviable tissue down to healthy bleeding viable tissue was performed with post-debridement measurements same as pre-. 3. The wound was cleansed and dry sterile dressing applied. 4. CAM boot dispensed. Weightbearing as tolerated.  5. Refill prescription for Gentamicin cream dispensed.  6. Refill prescription for Doxycycline 100 mg #20 provided to patient.  7. Return to clinic in 3 weeks.    Edrick Kins, DPM [facility]  Dr. Edrick Kins, [place]                                        [place], [place] [address]                Office [phone]  Fax [phone]

## 2019-10-27 ENCOUNTER — Other Ambulatory Visit: Payer: Self-pay | Admitting: Family Medicine

## 2019-10-27 ENCOUNTER — Ambulatory Visit (INDEPENDENT_AMBULATORY_CARE_PROVIDER_SITE_OTHER): Payer: 59 | Admitting: Podiatry

## 2019-10-27 ENCOUNTER — Other Ambulatory Visit: Payer: Self-pay

## 2019-10-27 DIAGNOSIS — E0843 Diabetes mellitus due to underlying condition with diabetic autonomic (poly)neuropathy: Secondary | ICD-10-CM

## 2019-10-27 DIAGNOSIS — L97522 Non-pressure chronic ulcer of other part of left foot with fat layer exposed: Secondary | ICD-10-CM | POA: Diagnosis not present

## 2019-10-27 DIAGNOSIS — Z89421 Acquired absence of other right toe(s): Secondary | ICD-10-CM

## 2019-10-27 DIAGNOSIS — Z9889 Other specified postprocedural states: Secondary | ICD-10-CM

## 2019-10-31 NOTE — Progress Notes (Signed)
   Subjective:  Patient presents today status post right fourth toe amputation. DOS: 08/31/2019. He is here for follow up evaluation of an ulceration of the left plantar forefoot. He states the wound looks like it is improving. He has been applying Gentamicin cream and using the CAM boot as directed. There are no worsening factors noted. Patient is here for further evaluation and treatment.   Past Medical History:  Diagnosis Date  . Cancer ([place])    SKIN   . Depression   . Diabetes mellitus without complication ([place])    Type II  . Dyspnea    with exertion   . Fatigue   . Hyperlipidemia   . Hypertension   . Hypogonadism male   . Neuromuscular disorder ([place])    "back nerve stimulator"  . Neuropathy   . PAD (peripheral artery disease) ([place])   . Sleep apnea    CPAP  . Tinnitus   . Tuberculosis    POSITIVE  TB SKIN TEST 1992.6 MTH TX .was exposed to someone who had it.      Objective: Neurovascular status intact.  Skin incisions appear to be well coapted and healed. No sign of infectious process noted. No dehiscence. No active bleeding noted. Moderate edema noted to the surgical extremity.  Wound noted to the left plantar forefoot measuring approximately 1.0 x 1.0 x 0.2 cm.   To the above-noted ulceration, there is no eschar. There is a moderate amount of slough, fibrin and necrotic tissue. Granulation tissue and wound base is red. There is no malodor. There is a minimal amount of serosanginous drainage noted. Periwound integrity is intact.    Assessment: 1. s/p right fourth toe amputation. DOS: 08/31/2019 - healed  2. Ulceration of the left plantar forefoot secondary to diabetes mellitus    Plan of Care:  1. Patient was evaluated.  2. Medically necessary excisional debridement including subcutaneous tissue was performed using a tissue nipper and a chisel blade. Excisional debridement of all the necrotic nonviable tissue down to healthy bleeding viable tissue was performed with  post-debridement measurements same as pre-. 3. The wound was cleansed and dry sterile dressing applied. 4. Continue using Gentamicin cream daily.  5. Revitaderm urea 40% lotion provided to patient.  6. Return to clinic in 3 weeks.     Edrick Kins, DPM [facility]  Dr. Edrick Kins, [place]                                        [place], [place] [address]                Office [phone]  Fax [phone]

## 2019-11-12 ENCOUNTER — Other Ambulatory Visit: Payer: Self-pay | Admitting: Family Medicine

## 2019-11-14 ENCOUNTER — Ambulatory Visit: Payer: 59 | Admitting: Podiatry

## 2019-11-17 ENCOUNTER — Ambulatory Visit: Payer: 59 | Admitting: Podiatry

## 2019-11-21 ENCOUNTER — Other Ambulatory Visit: Payer: Self-pay

## 2019-11-21 ENCOUNTER — Ambulatory Visit: Payer: 59 | Admitting: Podiatry

## 2019-11-21 DIAGNOSIS — Z89421 Acquired absence of other right toe(s): Secondary | ICD-10-CM

## 2019-11-21 DIAGNOSIS — E0843 Diabetes mellitus due to underlying condition with diabetic autonomic (poly)neuropathy: Secondary | ICD-10-CM

## 2019-11-21 DIAGNOSIS — L97522 Non-pressure chronic ulcer of other part of left foot with fat layer exposed: Secondary | ICD-10-CM

## 2019-11-24 NOTE — Progress Notes (Signed)
   Subjective:  Patient presents today status post right fourth toe amputation. DOS: 08/31/2019.He states he is improving slowly. He denies any significant pain. He has been using Gentamicin cream as directed. Patient is here for further evaluation and treatment.   Past Medical History:  Diagnosis Date  . Cancer ([place])    SKIN   . Depression   . Diabetes mellitus without complication ([place])    Type II  . Dyspnea    with exertion   . Fatigue   . Hyperlipidemia   . Hypertension   . Hypogonadism male   . Neuromuscular disorder ([place])    "back nerve stimulator"  . Neuropathy   . PAD (peripheral artery disease) ([place])   . Sleep apnea    CPAP  . Tinnitus   . Tuberculosis    POSITIVE  TB SKIN TEST 1992.6 MTH TX .was exposed to someone who had it.      Objective: Neurovascular status intact.  Skin incisions appear to be well coapted and healed. No sign of infectious process noted. No dehiscence. No active bleeding noted. Moderate edema noted to the surgical extremity.  Wound noted to the left plantar forefoot measuring approximately 0.7 x 0.7 x 0.2 cm.   To the above-noted ulceration, there is no eschar. There is a moderate amount of slough, fibrin and necrotic tissue. Granulation tissue and wound base is red. There is no malodor. There is a minimal amount of serosanginous drainage noted. Periwound integrity is intact.    Assessment: 1. s/p right fourth toe amputation. DOS: 08/31/2019 - healed  2. Ulceration of the left plantar forefoot secondary to diabetes mellitus    Plan of Care:  1. Patient was evaluated.  2. Medically necessary excisional debridement including subcutaneous tissue was performed using a tissue nipper and a chisel blade. Excisional debridement of all the necrotic nonviable tissue down to healthy bleeding viable tissue was performed with post-debridement measurements same as pre-. 3. The wound was cleansed and dry sterile dressing applied. 4. Silver wound gel  provided.  5. Return to clinic in 3 weeks.    Edrick Kins, DPM [facility]  Dr. Edrick Kins, [place]                                        [place], [place] [address]                Office [phone]  Fax [phone]

## 2019-12-19 ENCOUNTER — Other Ambulatory Visit: Payer: Self-pay | Admitting: Podiatry

## 2019-12-19 ENCOUNTER — Ambulatory Visit (INDEPENDENT_AMBULATORY_CARE_PROVIDER_SITE_OTHER): Payer: 59

## 2019-12-19 ENCOUNTER — Other Ambulatory Visit: Payer: Self-pay

## 2019-12-19 ENCOUNTER — Ambulatory Visit: Payer: 59 | Admitting: Podiatry

## 2019-12-19 DIAGNOSIS — L97522 Non-pressure chronic ulcer of other part of left foot with fat layer exposed: Secondary | ICD-10-CM

## 2019-12-19 DIAGNOSIS — Z89421 Acquired absence of other right toe(s): Secondary | ICD-10-CM

## 2019-12-19 DIAGNOSIS — E0843 Diabetes mellitus due to underlying condition with diabetic autonomic (poly)neuropathy: Secondary | ICD-10-CM

## 2019-12-22 NOTE — Progress Notes (Signed)
   Subjective:  59 year old male presenting today for follow up evaluation of an ulceration of the left plantar forefoot. He states he is doing well and reports improvement. He has been using the silver wound gel as directed. He denies any pain or worsening factors. Patient is here for further evaluation and treatment.   Past Medical History:  Diagnosis Date  . Cancer ([place])    SKIN   . Depression   . Diabetes mellitus without complication ([place])    Type II  . Dyspnea    with exertion   . Fatigue   . Hyperlipidemia   . Hypertension   . Hypogonadism male   . Neuromuscular disorder ([place])    "back nerve stimulator"  . Neuropathy   . PAD (peripheral artery disease) ([place])   . Sleep apnea    CPAP  . Tinnitus   . Tuberculosis    POSITIVE  TB SKIN TEST 1992.6 MTH TX .was exposed to someone who had it.      Objective: Physical Exam General: The patient is alert and oriented x3 in no acute distress.  Dermatology: Wound #1 noted to the left plantar forefoot measuring approximately 0.3 x 0.3 x 0.1 cm.   To the above-noted ulceration, there is no eschar. There is a moderate amount of slough, fibrin and necrotic tissue. Granulation tissue and wound base is red. There is no malodor. There is a minimal amount of serosanginous drainage noted. Periwound integrity is intact.  Skin is cool, dry and supple bilateral lower extremities.   Vascular: Palpable pedal pulses bilaterally. No edema or erythema noted. Capillary refill within normal limits.  Neurological: Epicritic and protective threshold grossly intact bilaterally.   Musculoskeletal Exam: All pedal and ankle joints range of motion within normal limits bilateral. Muscle strength 5/5 in all groups bilateral.   Radiographic Exam:  Normal osseous mineralization. Joint spaces preserved. No fracture/dislocation/boney destruction.    Assessment: 1. s/p right fourth toe amputation. DOS: 08/31/2019 - healed  2. Ulceration of the left plantar  forefoot secondary to diabetes mellitus    Plan of Care:  1. Patient was evaluated. X-Rays reviewed.  2. Medically necessary excisional debridement including subcutaneous tissue was performed using a tissue nipper and a chisel blade. Excisional debridement of all the necrotic nonviable tissue down to healthy bleeding viable tissue was performed with post-debridement measurements same as pre-. 3. The wound was cleansed and dry sterile dressing applied. 4. Continue using silver wound gel.  5. Return to clinic in 4 weeks.    Edrick Kins, DPM [facility]  Dr. Edrick Kins, [place] [address]                                        [place], [place] [address]                Office [phone]  Fax [phone]

## 2020-01-05 ENCOUNTER — Other Ambulatory Visit: Payer: Self-pay | Admitting: Family Medicine

## 2020-01-05 DIAGNOSIS — I10 Essential (primary) hypertension: Secondary | ICD-10-CM

## 2020-01-06 ENCOUNTER — Other Ambulatory Visit: Payer: Self-pay | Admitting: Family Medicine

## 2020-01-06 DIAGNOSIS — I10 Essential (primary) hypertension: Secondary | ICD-10-CM

## 2020-01-06 NOTE — Telephone Encounter (Signed)
30 day courtesy RF Requested Prescriptions  Pending Prescriptions Disp Refills  . losartan (COZAAR) 100 MG tablet [Pharmacy Med Name: LOSARTAN POTASSIUM 100 MG TAB] 30 tablet 11    Sig: TAKE 1 TABLET BY MOUTH EVERY DAY     Cardiovascular:  Angiotensin Receptor Blockers Failed - 01/06/2020  3:56 PM      Failed - Cr in normal range and within 180 days    Creat  Date Value Ref Range Status  05/05/2017 0.91 0.70 - 1.33 mg/dL Final    Comment:    For patients >37 years of age, the reference limit for Creatinine is approximately 13% higher for people identified as African-American. .    Creatinine, Ser  Date Value Ref Range Status  02/17/2019 1.51 (H) 0.61 - 1.24 mg/dL Final         Failed - K in normal range and within 180 days    Potassium  Date Value Ref Range Status  02/17/2019 4.0 3.5 - 5.1 mmol/L Final  12/28/2012 4.1 3.5 - 5.1 mmol/L Final         Failed - Valid encounter within last 6 months    Recent Outpatient Visits          8 months ago DDD (degenerative disc disease), cervical   [facility] Jerrol Banana., [facility]   10 months ago Cervical strain, acute, initial encounter   [facility] Jerrol Banana., MD   1 year ago Type 2 diabetes mellitus with diabetic polyneuropathy, without long-term current use of insulin [facility])   [facility] Jerrol Banana., MD   1 year ago Essential (primary) hypertension   [facility] Jerrol Banana., MD   1 year ago Pre-op evaluation   [facility] Jerrol Banana., MD             Passed - Patient is not pregnant      Passed - Last BP in normal range    BP Readings from Last 1 Encounters:  05/10/19 138/82         . metFORMIN (GLUCOPHAGE) 1000 MG tablet [Pharmacy Med Name: METFORMIN HCL 1,000 MG TABLET] 60 tablet 0    Sig: TAKE 1 TABLET BY MOUTH TWICE A DAY WITH MEALS     Endocrinology:  Diabetes - Biguanides Failed -  01/06/2020  3:56 PM      Failed - Cr in normal range and within 360 days    Creat  Date Value Ref Range Status  05/05/2017 0.91 0.70 - 1.33 mg/dL Final    Comment:    For patients >62 years of age, the reference limit for Creatinine is approximately 13% higher for people identified as African-American. .    Creatinine, Ser  Date Value Ref Range Status  02/17/2019 1.51 (H) 0.61 - 1.24 mg/dL Final         Failed - HBA1C is between 0 and 7.9 and within 180 days    Hgb A1c MFr Bld  Date Value Ref Range Status  08/18/2018 6.6 (H) 4.8 - 5.6 % Final    Comment:             Prediabetes: 5.7 - 6.4          Diabetes: >6.4          Glycemic control for adults with diabetes: <7.0          Failed - eGFR in normal range and within 360 days  GFR calc Af Amer  Date Value Ref Range Status  02/17/2019 57 (L) >60 mL/min Final   GFR calc non Af Amer  Date Value Ref Range Status  02/17/2019 49 (L) >60 mL/min Final         Failed - Valid encounter within last 6 months    Recent Outpatient Visits          8 months ago DDD (degenerative disc disease), cervical   [facility] Jerrol Banana., MD   10 months ago Cervical strain, acute, initial encounter   [facility] Jerrol Banana., MD   1 year ago Type 2 diabetes mellitus with diabetic polyneuropathy, without long-term current use of insulin [facility])   [facility] Jerrol Banana., MD   1 year ago Essential (primary) hypertension   [facility] Jerrol Banana., MD   1 year ago Pre-op evaluation   [facility] Jerrol Banana., MD

## 2020-01-17 IMAGING — DX DG FOOT COMPLETE 3+V*R*
3 series · 3 of 3 positions shown · non-contrast
Comparison: Plain films 02/25/2018

CLINICAL DATA: Postoperative assessment.

EXAM:
RIGHT FOOT COMPLETE - 3+ VIEW

[foot ap]
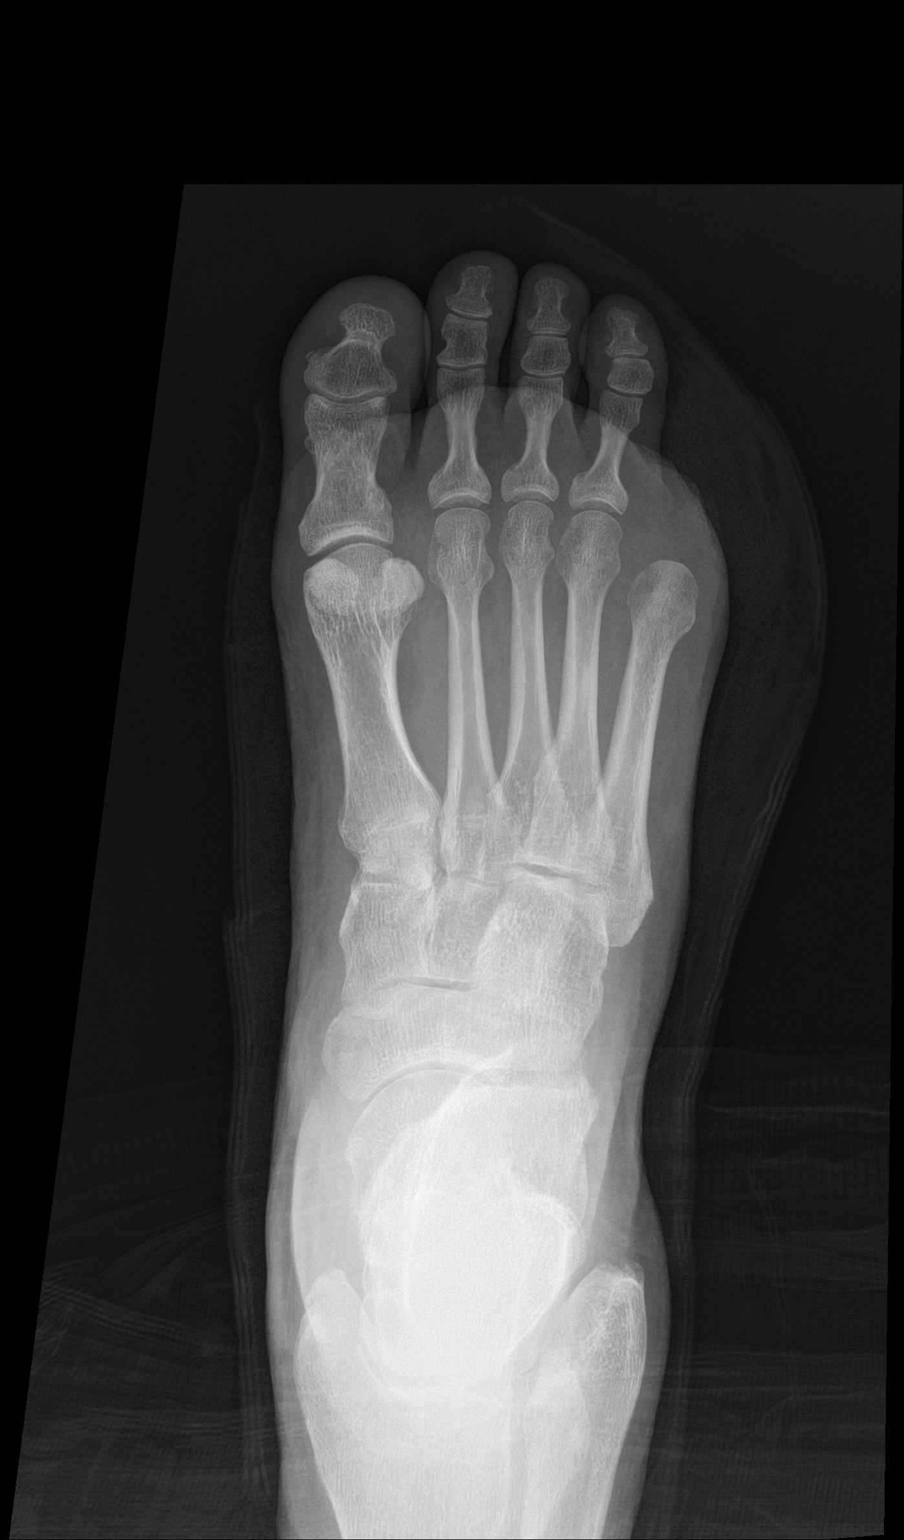

[foot obl]
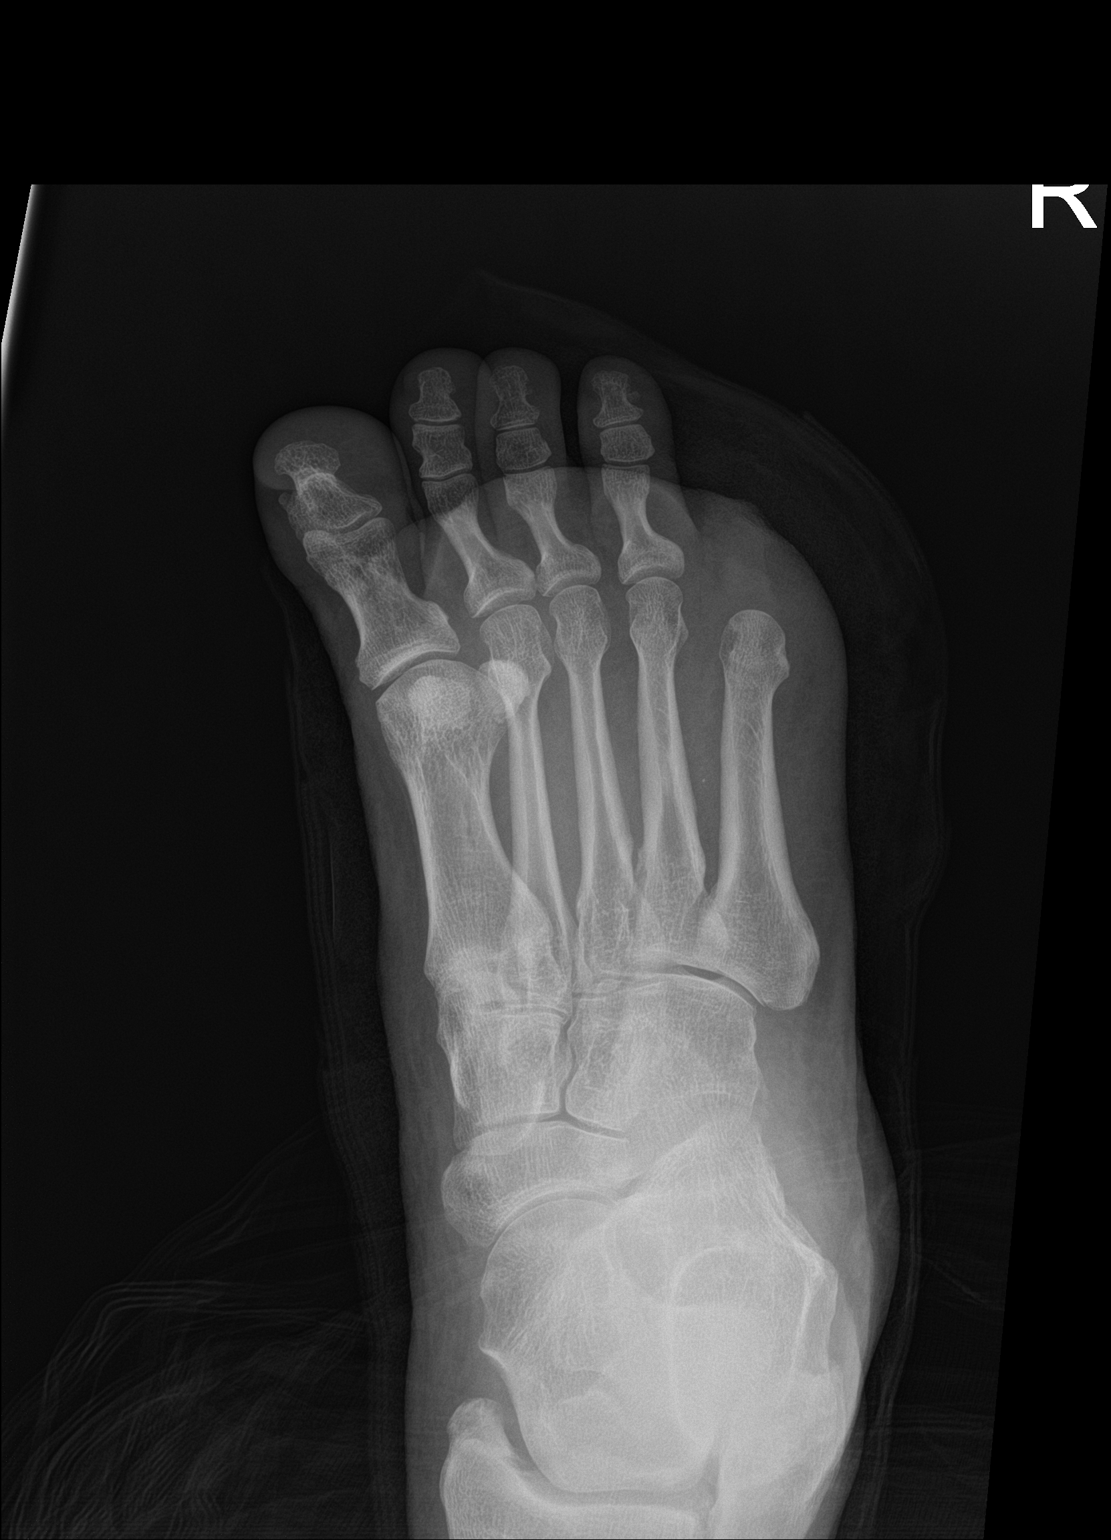

[foot lat]
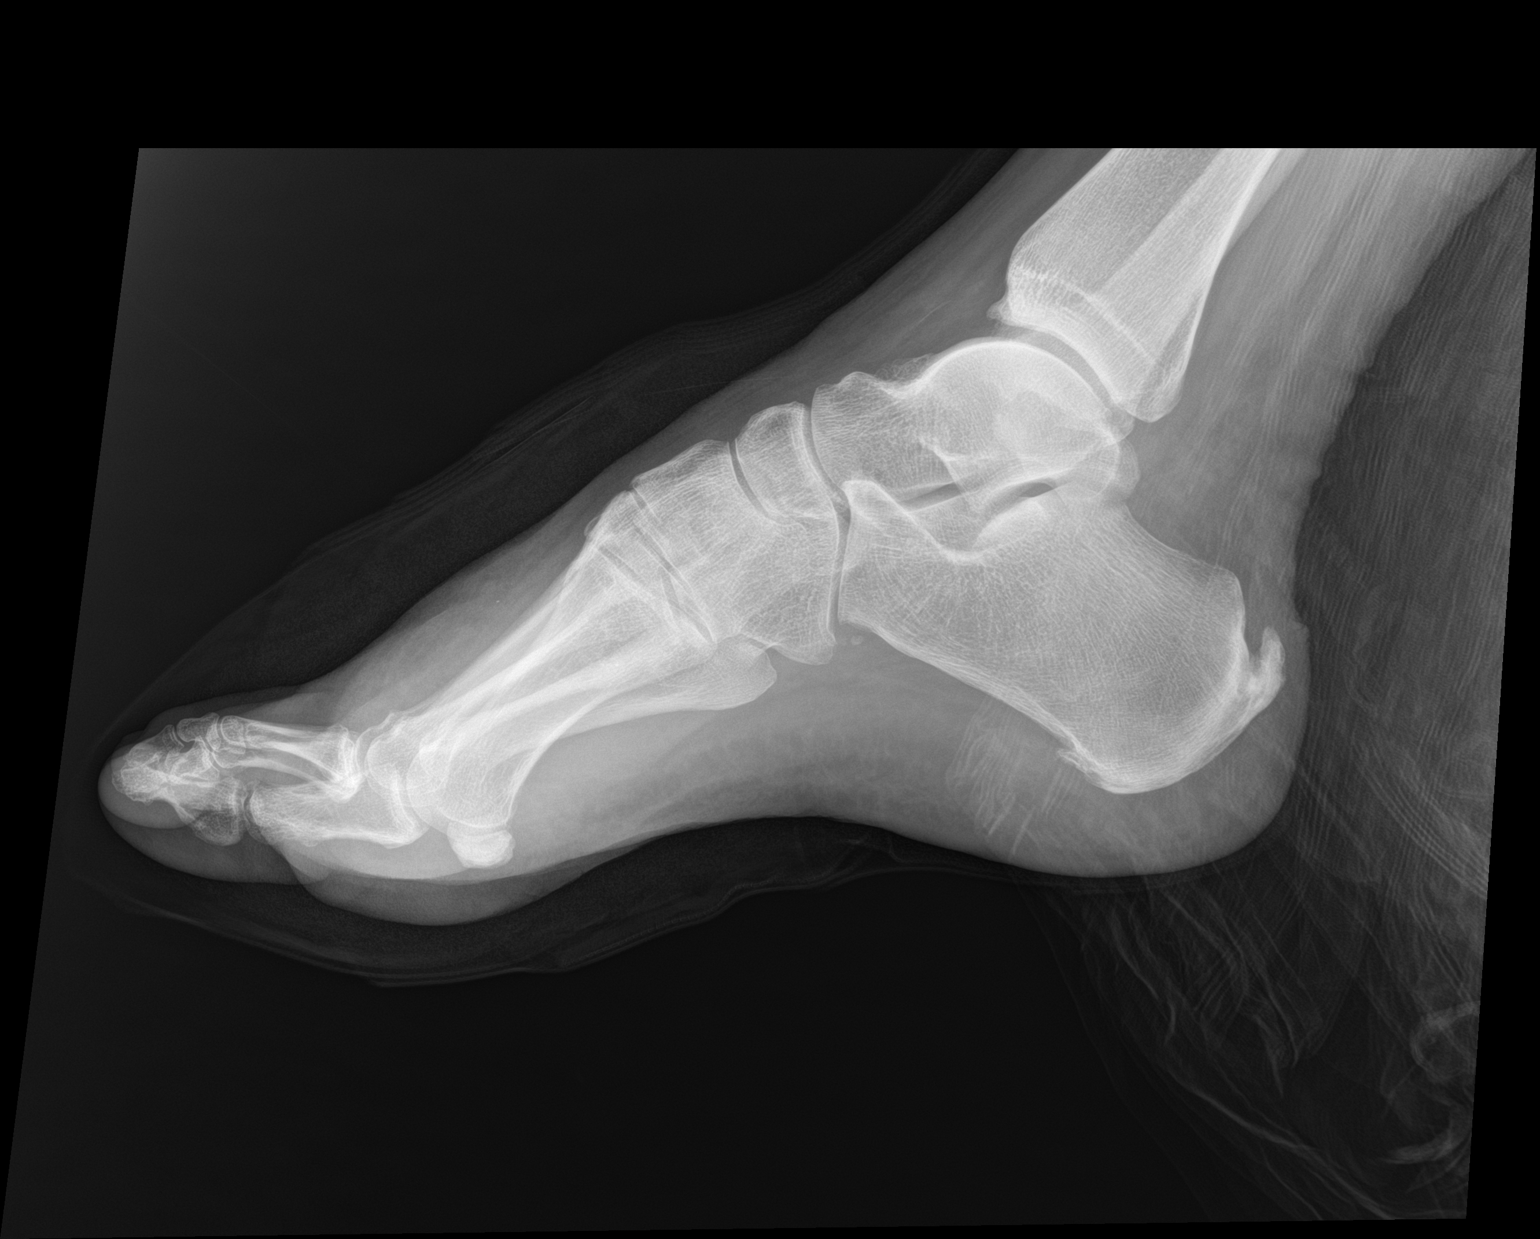

[3 of 3 positions shown; findings below may reference images not displayed]

FINDINGS: Patient is status post amputation of the fifth toe for
osteomyelitis. There is corresponding soft tissue loss. The fifth
metatarsal and remaining digits appear unremarkable.
IMPRESSION: Satisfactory post amputation appearance.

## 2020-01-23 ENCOUNTER — Ambulatory Visit: Payer: 59 | Admitting: Podiatry

## 2020-01-30 ENCOUNTER — Ambulatory Visit: Payer: 59 | Admitting: Podiatry

## 2020-01-30 ENCOUNTER — Other Ambulatory Visit: Payer: Self-pay

## 2020-01-30 DIAGNOSIS — L97512 Non-pressure chronic ulcer of other part of right foot with fat layer exposed: Secondary | ICD-10-CM

## 2020-01-30 DIAGNOSIS — E0843 Diabetes mellitus due to underlying condition with diabetic autonomic (poly)neuropathy: Secondary | ICD-10-CM

## 2020-01-30 DIAGNOSIS — L97522 Non-pressure chronic ulcer of other part of left foot with fat layer exposed: Secondary | ICD-10-CM

## 2020-01-30 MED ORDER — DOXYCYCLINE HYCLATE 100 MG PO TABS: 100.0000 mg | ORAL_TABLET | Freq: Two times a day (BID) | ORAL | 0 refills | Status: DC

## 2020-01-30 NOTE — Progress Notes (Signed)
Subjective:  59 y.o. male with PMHx of diabetes mellitus presents today for follow-up evaluation regarding an ulcer to the plantar aspect of the left forefoot.  Patient states that is doing very well and he has been applying the silver wound gel as directed.  Patient believes that the wound is completely healed. Patient states that he does have a new complaint regarding an ulcer that developed to the right hallux.  He states that just yesterday he noticed a blister that popped to the plantar aspect of the right hallux.  He denies any injury or change in shoe gear and cannot recall anything that would have elicited the wound on the plantar aspect of the toe.  He presents for further treatment evaluation   Past Medical History:  Diagnosis Date  . Cancer ([place])    SKIN   . Depression   . Diabetes mellitus without complication ([place])    Type II  . Dyspnea    with exertion   . Fatigue   . Hyperlipidemia   . Hypertension   . Hypogonadism male   . Neuromuscular disorder ([place])    "back nerve stimulator"  . Neuropathy   . PAD (peripheral artery disease) ([place])   . Sleep apnea    CPAP  . Tinnitus   . Tuberculosis    POSITIVE  TB SKIN TEST 1992.6 MTH TX .was exposed to someone who had it.      Objective/Physical Exam General: The patient is alert and oriented x3 in no acute distress.  Dermatology:  Wound #1 noted to the plantar aspect of the right hallux measuring approximately 1.0 x 1.0 x 0.3 cm (LxWxD).   The ulcer to the left plantar forefoot has actually completely healed.  Complete reepithelialization has occurred.  To the noted ulceration(s), there is no eschar. There is a moderate amount of slough, fibrin, and necrotic tissue noted. Granulation tissue and wound base is red. There is a minimal amount of serosanguineous drainage noted. There is no exposed bone muscle-tendon ligament or joint. There is no malodor. Periwound integrity is intact. Skin is warm, dry and supple bilateral  lower extremities.  Vascular: Palpable pedal pulses bilaterally. No edema or erythema noted. Capillary refill within normal limits.  Neurological: Epicritic and protective threshold diminished bilaterally.   Musculoskeletal Exam: Range of motion within normal limits to all pedal and ankle joints bilateral. Muscle strength 5/5 in all groups bilateral.   Assessment: 1.  Ulcer right hallux secondary to diabetes mellitus 2. diabetes mellitus w/ peripheral neuropathy   Plan of Care:  1. Patient was evaluated. 2. medically necessary excisional debridement including subcutaneous tissue was performed using a tissue nipper and a chisel blade. Excisional debridement of all the necrotic nonviable tissue down to healthy bleeding viable tissue was performed with post-debridement measurements same as pre-. 3. the wound was cleansed and dry sterile dressing applied. 4.  Cultures were taken today and sent to pathology for culture and sensitivity 5.  Prescription for doxycycline 100 mg #20 twice daily 6.  The patient has plenty of silver wound gel at home.  Recommend wound gel daily with a light dressing to the right hallux 7.  Return to clinic in 3 weeks   Edrick Kins, DPM [facility]  Dr. Edrick Kins, DPM    [address]  [place], [place] [address]                Office [phone]  Fax [phone]

## 2020-02-03 ENCOUNTER — Other Ambulatory Visit: Payer: Self-pay | Admitting: Family Medicine

## 2020-02-03 NOTE — Telephone Encounter (Signed)
Pt overdue for appt/pt previously given courtesy refill- called pt and LM on VM to call office to make appt for refills. Prescription request refused.

## 2020-02-06 LAB — WOUND CULTURE: Organism ID, Bacteria: NONE SEEN

## 2020-02-20 ENCOUNTER — Ambulatory Visit: Payer: 59 | Admitting: Podiatry

## 2020-03-05 ENCOUNTER — Ambulatory Visit: Payer: 59 | Admitting: Podiatry

## 2020-03-10 ENCOUNTER — Other Ambulatory Visit: Payer: Self-pay | Admitting: Family Medicine

## 2020-03-10 DIAGNOSIS — E118 Type 2 diabetes mellitus with unspecified complications: Secondary | ICD-10-CM

## 2020-03-10 NOTE — Telephone Encounter (Signed)
Requested Prescriptions  Pending Prescriptions Disp Refills  . ONETOUCH VERIO test strip [employer] Med Name: ONE TOUCH VERIO TEST STRIP] 25 strip 31    Sig: CHECK SUGAR ONCE DAILY DX E11.9     Endocrinology: Diabetes - Testing Supplies Passed - 03/10/2020  9:13 AM      Passed - Valid encounter within last 12 months    Recent Outpatient Visits          10 months ago DDD (degenerative disc disease), cervical   [facility] Jerrol Banana., MD   1 year ago Cervical strain, acute, initial encounter   [facility] Jerrol Banana., MD   1 year ago Type 2 diabetes mellitus with diabetic polyneuropathy, without long-term current use of insulin [facility])   [facility] Jerrol Banana., MD   1 year ago Essential (primary) hypertension   [facility] Jerrol Banana., MD   2 years ago Pre-op evaluation   [facility] Jerrol Banana., MD

## 2020-03-19 ENCOUNTER — Other Ambulatory Visit: Payer: Self-pay

## 2020-03-19 ENCOUNTER — Ambulatory Visit: Payer: 59 | Admitting: Podiatry

## 2020-03-19 DIAGNOSIS — L97512 Non-pressure chronic ulcer of other part of right foot with fat layer exposed: Secondary | ICD-10-CM

## 2020-03-19 DIAGNOSIS — E0843 Diabetes mellitus due to underlying condition with diabetic autonomic (poly)neuropathy: Secondary | ICD-10-CM | POA: Diagnosis not present

## 2020-03-19 DIAGNOSIS — Z89421 Acquired absence of other right toe(s): Secondary | ICD-10-CM | POA: Diagnosis not present

## 2020-03-19 NOTE — Progress Notes (Signed)
   Subjective:  59 y.o. male with PMHx of diabetes mellitus presents today for follow-up evaluation regarding an ulcer to the right hallux.  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  He wears OTC arch supports in his boots.  He states that recently he has noticed a significant amount of swelling to the bilateral lower extremities.  He has an appointment with his PCP on Thursday, 03/21/2020.   Past Medical History:  Diagnosis Date  . Cancer ([place])    SKIN   . Depression   . Diabetes mellitus without complication ([place])    Type II  . Dyspnea    with exertion   . Fatigue   . Hyperlipidemia   . Hypertension   . Hypogonadism male   . Neuromuscular disorder ([place])    "back nerve stimulator"  . Neuropathy   . PAD (peripheral artery disease) ([place])   . Sleep apnea    CPAP  . Tinnitus   . Tuberculosis    POSITIVE  TB SKIN TEST 1992.6 MTH TX .was exposed to someone who had it.      Objective/Physical Exam General: The patient is alert and oriented x3 in no acute distress.  Dermatology:  Wound #1 noted to the plantar aspect of the right hallux measuring approximately 1.0 x 1.0 x 0.2 cm (LxWxD).   The ulcer to the left plantar forefoot has completely healed.    To the noted ulceration(s), there is no eschar. There is a moderate amount of slough, fibrin, and necrotic tissue noted. Granulation tissue and wound base is red. There is a minimal amount of serosanguineous drainage noted. There is no exposed bone muscle-tendon ligament or joint. There is no malodor. Periwound integrity is intact. Skin is warm, dry and supple bilateral lower extremities.  Vascular: Palpable pedal pulses bilaterally.  Bilateral lower extremity edema noted up to the leg.  Negative for any erythema that would be concerning for deep tissue cellulitis.  Capillary refill within normal limits.  Neurological: Epicritic and protective threshold  diminished bilaterally.   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   Assessment: 1.  Ulcer right hallux secondary to diabetes mellitus 2. diabetes mellitus w/ peripheral neuropathy   Plan of Care:  1. Patient was evaluated.  Cultures reviewed today 2. medically necessary excisional debridement including subcutaneous tissue was performed using a tissue nipper and a chisel blade. Excisional debridement of all the necrotic nonviable tissue down to healthy bleeding viable tissue was performed with post-debridement measurements same as pre-. 3. the wound was cleansed and dry sterile dressing applied. 4.  Continue silver wound gel at home daily. 5.  Today OTC power step insoles were provided for the patient.  Recommend new boots and to wear the insoles.  His current insoles are completely worn out 6.  Return to clinic in 3 weeks   Edrick Kins, DPM [facility]  Dr. Edrick Kins, [address]                                        [place], [place] [address]                Office [phone]  Fax [phone]

## 2020-03-20 NOTE — Progress Notes (Signed)
Jeff [name],acting as a scribe for Jeff Durie, MD.,have documented all relevant documentation on the behalf of Jeff Durie, MD,as directed by  Jeff Durie, MD while in the presence of Jeff Durie, MD.   Established patient visit   Patient: Jeff [name]   DOB: 1960-11-24   59 y.o. Male  MRN: [id number] Visit Date: 03/21/2020  Today's healthcare provider: [name], MD   Chief Complaint  Patient presents with  . Diabetes  . Follow-up  . Hyperlipidemia   Subjective    HPI  Patient now wearing knee-high support hose.  These help. Diabetes Mellitus Type II, follow-up  Lab Results  Component Value Date   HGBA1C 6.6 (H) 08/18/2018   HGBA1C 5.7 (H) 03/04/2018   HGBA1C 6.3 12/07/2017   Last seen for diabetes 08/11/2018.  Management since then includes; labs checked showing-stable. Diabetes a little higher. Continue to work on habits. He reports good compliance with treatment. He is not having side effects. none  Home blood sugar records: fasting range: checking but doesn't record numbers  Episodes of hypoglycemia? No none   Current insulin regiment: n/a Most Recent Eye Exam: due  -------------------------------------------------------------------- Hypertension, follow-up  BP Readings from Last 3 Encounters:  03/21/20 (!) 147/80  05/10/19 138/82  02/20/19 134/72   Wt Readings from Last 3 Encounters:  03/21/20 213 lb (96.6 kg)  05/10/19 220 lb (99.8 kg)  02/20/19 209 lb (94.8 kg)     He was last seen for hypertension 08/11/2018.  BP at that visit was 130/74. Management since that visit includes, losartan, metoprolol, and HCTZ. He reports good compliance with treatment. He is not having side effects. Not checking He is exercising. He is adherent to low salt diet.   Outside blood pressures are 130/70.  He does not smoke.  Use of agents associated with hypertension: none.    -------------------------------------------------------------------- Lipid/Cholesterol, follow-up  Last Lipid Panel: Lab Results  Component Value Date   CHOL 136 08/18/2018   LDLCALC 64 08/18/2018   HDL 24 (L) 08/18/2018   TRIG 242 (H) 08/18/2018    He was last seen for this 08/11/2018.  Management since that visit includes; labs checked showing-stable. He reports good compliance with treatment. He is not having side effects. none He is following a Regular diet. Current exercise: walking  Last metabolic panel Lab Results  Component Value Date   GLUCOSE 126 (H) 02/17/2019   NA 132 (L) 02/17/2019   K 4.0 02/17/2019   BUN 17 02/17/2019   CREATININE 1.51 (H) 02/17/2019   GFRNONAA 49 (L) 02/17/2019   GFRAA 57 (L) 02/17/2019   CALCIUM 8.4 (L) 02/17/2019   AST 20 02/17/2019   ALT 19 02/17/2019   The 10-year ASCVD risk score Jeff [name]., et al., 2013) is: 28.9%  --------------------------------------------------------------------     Medications: Outpatient Medications Prior to Visit  Medication Sig  . ARIPiprazole (ABILIFY) 5 MG tablet TAKE 1 TABLET BY MOUTH EVERY DAY  . aspirin 81 MG tablet Take 81 mg by mouth daily.  Marland Kitchen atorvastatin (LIPITOR) 10 MG tablet TAKE 1 TABLET BY MOUTH EVERYDAY AT BEDTIME  . clomiPHENE (CLOMID) 50 MG tablet Take 0.5 tablets (25 mg total) by mouth daily.  Marland Kitchen gentamicin cream (GARAMYCIN) 0.1 % Apply 1 application topically 2 (two) times daily.  . hydrochlorothiazide (HYDRODIURIL) 25 MG tablet TAKE 1 TABLET BY MOUTH EVERY DAY. *INSURANCE ONLY COVERS 30 DAYS**  . HYDROcodone-acetaminophen (NORCO/VICODIN) 5-325 MG tablet Take 1 tablet by mouth every  6 (six) hours as needed for moderate pain.  . hydrocortisone 2.5 % cream Apply topically.  Marland Kitchen ibuprofen (ADVIL) 400 MG tablet Take 400 mg by mouth 3 (three) times daily as needed.  Marland Kitchen JARDIANCE 25 MG TABS tablet TAKE 1 TABLET BY MOUTH DAILY  . losartan (COZAAR) 100 MG tablet TAKE 1 TABLET BY MOUTH EVERY  DAY  . LYRICA 100 MG capsule LIMIT 1 CAPSULE BY MOUTH 3 - 5 TIMES PER DAY IF TOLERATED (NOTE THAT CAPSULE IS NOW 100 MG SIZE)  . metFORMIN (GLUCOPHAGE) 1000 MG tablet TAKE 1 TABLET BY MOUTH TWICE A DAY WITH MEALS  . metoprolol succinate (TOPROL-XL) 50 MG 24 hr tablet TAKE 1 TABLET BY MOUTH EVERY DAY  . ONETOUCH VERIO test strip CHECK SUGAR ONCE DAILY DX E11.9  . orphenadrine (NORFLEX) 100 MG tablet Take 100 mg by mouth 2 (two) times daily.  . Oxycodone HCl 10 MG TABS LIMIT 1 TABLET BY MOUTH 4   6 TIMES PER DAY IF TOLERATED,  . oxyCODONE-acetaminophen (PERCOCET) 5-325 MG tablet Take 1 tablet by mouth every 6 (six) hours as needed for severe pain.  . sildenafil (REVATIO) 20 MG tablet TAKE 1 TO 5 TABLETS BY MOUTH DAILY AS NEEDED  . venlafaxine (EFFEXOR) 75 MG tablet TAKE 3 TABLETS (225 MG TOTAL) BY MOUTH DAILY.  . baclofen (LIORESAL) 10 MG tablet TAKE 1 TABLET BY MOUTH 3 TIMES A DAY AS NEEDED FOR SPASMS (Patient not taking: Reported on 03/21/2020)  . doxycycline (VIBRA-TABS) 100 MG tablet Take 1 tablet (100 mg total) by mouth 2 (two) times daily. (Patient not taking: Reported on 03/21/2020)   No facility-administered medications prior to visit.    Review of Systems  Constitutional: Negative for appetite change, chills and fever.  Respiratory: Negative for chest tightness, shortness of breath and wheezing.   Cardiovascular: Negative for chest pain and palpitations.  Gastrointestinal: Negative for abdominal pain, nausea and vomiting.      Objective    BP (!) 147/80 (BP Location: Right Arm, Cuff Size: Large)   Pulse 61   Temp 97.7 F (36.5 C) (Oral)   Resp 18   Ht 5\' 8"  (1.727 m)   Wt 213 lb (96.6 kg)   SpO2 99%   BMI 32.39 kg/m    Physical Exam Vitals reviewed.  Constitutional:      Appearance: He is well-developed.  HENT:     Head: Normocephalic and atraumatic.     Right Ear: External ear normal.     Left Ear: External ear normal.     Nose: Nose normal.  Eyes:     General:  No scleral icterus.    Conjunctiva/sclera: Conjunctivae normal.  Neck:     Thyroid: No thyromegaly.  Cardiovascular:     Rate and Rhythm: Normal rate and regular rhythm.     Heart sounds: Normal heart sounds.  Pulmonary:     Effort: Pulmonary effort is normal.     Breath sounds: Normal breath sounds.  Abdominal:     Palpations: Abdomen is soft.  Musculoskeletal:     Right lower leg: Edema present.     Left lower leg: Edema present.     Comments: 1+ edema with support hose.  Skin:    General: Skin is warm and dry.     Comments: He is missing most of the toenails of his feet.  Neurological:     Mental Status: He is alert and oriented to person, place, and time.  Psychiatric:  Mood and Affect: Mood normal.        Behavior: Behavior normal.        Thought Content: Thought content normal.        Judgment: Judgment normal.     BP (!) 147/80 (BP Location: Right Arm, Cuff Size: Large)   Pulse 61   Temp 97.7 F (36.5 C) (Oral)   Resp 18   Ht 5\' 8"  (1.727 m)   Wt 213 lb (96.6 kg)   SpO2 99%   BMI 32.39 kg/m   General Appearance:    Alert, cooperative, no distress, appears stated age  Head:    Normocephalic, without obvious abnormality, atraumatic  Eyes:    PERRL, conjunctiva/corneas clear, EOM's intact, fundi    benign, both eyes       Ears:    Normal TM's and external ear canals, both ears  Nose:   Nares normal, septum midline, mucosa normal, no drainage   or sinus tenderness  Throat:   Lips, mucosa, and tongue normal; teeth and gums normal  Neck:   Supple, symmetrical, trachea midline, no adenopathy;       thyroid:  No enlargement/tenderness/nodules; no carotid   bruit or JVD  Back:     Symmetric, no curvature, ROM normal, no CVA tenderness  Lungs:     Clear to auscultation bilaterally, respirations unlabored  Chest wall:    No tenderness or deformity  Heart:    Regular rate and rhythm, S1 and S2 normal, no murmur, rub   or gallop  Abdomen:     Soft, non-tender,  bowel sounds active all four quadrants,    no masses, no organomegaly  Genitalia:    Normal male without lesion, discharge or tenderness  Rectal:    Normal tone, normal prostate, no masses or tenderness;   guaiac negative stool  Extremities:   Extremities normal, atraumatic, no cyanosis or edema  Pulses:   2+ and symmetric all extremities  Skin:   Skin color, texture, turgor normal, no rashes or lesions  Lymph nodes:   Cervical, supraclavicular, and axillary nodes normal  Neurologic:   CNII-XII intact. Normal strength, sensation and reflexes      throughout     No results found for any visits on 03/21/20.  Assessment & Plan     1. Type 2 diabetes mellitus with complication, without long-term current use of insulin (HCC)  - Lipid panel - CBC - Comprehensive metabolic panel - TSH - Hemoglobin A1c  2. Essential (primary) hypertension  - Lipid panel - CBC - Comprehensive metabolic panel - TSH - Hemoglobin A1c  3. Hyperlipidemia, unspecified hyperlipidemia type On atorvastatin - Lipid panel - CBC - Comprehensive metabolic panel - TSH - Hemoglobin A1c 4. ASCVD (arteriosclerotic cardiovascular disease) Risk factors treated  5. Type 2 diabetes mellitus with diabetic polyneuropathy, without long-term current use of insulin ([place]) On Jardiance  6. Recurrent major depressive disorder, in partial remission ([place]) On Abilify and Effexor   Return in about 4 months (around 07/21/2020) for CPE.         Hilliard Borges Cranford Mon, MD  [facility] [phone] (phone) [phone] (fax)  [place]

## 2020-03-21 ENCOUNTER — Other Ambulatory Visit: Payer: Self-pay

## 2020-03-21 ENCOUNTER — Encounter: Payer: Self-pay | Admitting: Family Medicine

## 2020-03-21 ENCOUNTER — Ambulatory Visit: Payer: 59 | Admitting: Family Medicine

## 2020-03-21 VITALS — BP 147/80 | HR 61 | Temp 97.7°F | Resp 18 | Ht 68.0 in | Wt 213.0 lb

## 2020-03-21 DIAGNOSIS — E118 Type 2 diabetes mellitus with unspecified complications: Secondary | ICD-10-CM

## 2020-03-21 DIAGNOSIS — E785 Hyperlipidemia, unspecified: Secondary | ICD-10-CM | POA: Diagnosis not present

## 2020-03-21 DIAGNOSIS — I2510 Atherosclerotic heart disease of native coronary artery without angina pectoris: Secondary | ICD-10-CM

## 2020-03-21 DIAGNOSIS — F3341 Major depressive disorder, recurrent, in partial remission: Secondary | ICD-10-CM

## 2020-03-21 DIAGNOSIS — E1142 Type 2 diabetes mellitus with diabetic polyneuropathy: Secondary | ICD-10-CM

## 2020-03-21 DIAGNOSIS — I10 Essential (primary) hypertension: Secondary | ICD-10-CM

## 2020-03-21 NOTE — Patient Instructions (Signed)
Knee High Support Hose.

## 2020-03-22 LAB — COMPREHENSIVE METABOLIC PANEL
ALT: 17 IU/L (ref 0–44)
AST: 19 IU/L (ref 0–40)
Albumin/Globulin Ratio: 1.1 — ABNORMAL LOW (ref 1.2–2.2)
Albumin: 4 g/dL (ref 3.8–4.8)
Alkaline Phosphatase: 101 IU/L (ref 48–121)
BUN/Creatinine Ratio: 12 (ref 10–24)
BUN: 15 mg/dL (ref 8–27)
Bilirubin Total: 0.6 mg/dL (ref 0.0–1.2)
CO2: 24 mmol/L (ref 20–29)
Calcium: 9.1 mg/dL (ref 8.6–10.2)
Chloride: 98 mmol/L (ref 96–106)
Creatinine, Ser: 1.25 mg/dL (ref 0.76–1.27)
GFR calc Af Amer: 71 mL/min/{1.73_m2} (ref >59)
GFR calc non Af Amer: 62 mL/min/{1.73_m2} (ref >59)
Globulin, Total: 3.5 g/dL (ref 1.5–4.5)
Glucose: 84 mg/dL (ref 65–99)
Potassium: 4.5 mmol/L (ref 3.5–5.2)
Sodium: 135 mmol/L (ref 134–144)
Total Protein: 7.5 g/dL (ref 6.0–8.5)

## 2020-03-22 LAB — CBC
Hematocrit: 46.9 % (ref 37.5–51.0)
Hemoglobin: 16 g/dL (ref 13.0–17.7)
MCH: 33.1 pg — ABNORMAL HIGH (ref 26.6–33.0)
MCHC: 34.1 g/dL (ref 31.5–35.7)
MCV: 97 fL (ref 79–97)
Platelets: 258 10*3/uL (ref 150–450)
RBC: 4.83 x10E6/uL (ref 4.14–5.80)
RDW: 13 % (ref 11.6–15.4)
WBC: 6.4 10*3/uL (ref 3.4–10.8)

## 2020-03-22 LAB — LIPID PANEL
Chol/HDL Ratio: 6.3 ratio — ABNORMAL HIGH (ref 0.0–5.0)
Cholesterol, Total: 189 mg/dL (ref 100–199)
HDL: 30 mg/dL — ABNORMAL LOW (ref >39)
LDL Chol Calc (NIH): 135 mg/dL — ABNORMAL HIGH (ref 0–99)
Triglycerides: 134 mg/dL (ref 0–149)
VLDL Cholesterol Cal: 24 mg/dL (ref 5–40)

## 2020-03-22 LAB — TSH: TSH: 3.96 u[IU]/mL (ref 0.450–4.500)

## 2020-03-22 LAB — HEMOGLOBIN A1C
Est. average glucose Bld gHb Est-mCnc: 123 mg/dL
Hgb A1c MFr Bld: 5.9 % — ABNORMAL HIGH (ref 4.8–5.6)

## 2020-03-28 ENCOUNTER — Telehealth: Payer: Self-pay | Admitting: *Deleted

## 2020-03-28 DIAGNOSIS — E785 Hyperlipidemia, unspecified: Secondary | ICD-10-CM

## 2020-03-28 NOTE — Telephone Encounter (Signed)
LMOVM for pt to return call. Okay for [facility] triage nurse to give patient results.

## 2020-03-28 NOTE — Telephone Encounter (Signed)
-----   Message from Jerrol Banana., MD sent at 03/28/2020 12:47 PM EDT ----- Labs okay but cholesterol too high.  Would increase atorvastatin from 10 mg daily to 40 mg daily.  Keep follow-up appointment.

## 2020-03-28 NOTE — Telephone Encounter (Signed)
Result note read to patient, verbalizes understanding. Agreeable to increase in dosage.

## 2020-03-28 NOTE — Telephone Encounter (Signed)
Patient called, left VM to return the call to the office for results.  

## 2020-03-29 MED ORDER — ATORVASTATIN CALCIUM 40 MG PO TABS: 40.0000 mg | ORAL_TABLET | Freq: Every day | ORAL | 11 refills | Status: DC

## 2020-03-29 NOTE — Telephone Encounter (Signed)
Refill sent to patients pharmacy. 

## 2020-03-29 NOTE — Addendum Note (Signed)
Addended by: Gerald Stabs on: 03/29/2020 08:05 AM   Modules accepted: Orders

## 2020-04-23 ENCOUNTER — Other Ambulatory Visit: Payer: Self-pay

## 2020-04-23 ENCOUNTER — Ambulatory Visit (INDEPENDENT_AMBULATORY_CARE_PROVIDER_SITE_OTHER): Payer: 59 | Admitting: Podiatry

## 2020-04-23 DIAGNOSIS — L97512 Non-pressure chronic ulcer of other part of right foot with fat layer exposed: Secondary | ICD-10-CM

## 2020-04-23 DIAGNOSIS — L97522 Non-pressure chronic ulcer of other part of left foot with fat layer exposed: Secondary | ICD-10-CM | POA: Diagnosis not present

## 2020-04-23 DIAGNOSIS — Z89421 Acquired absence of other right toe(s): Secondary | ICD-10-CM | POA: Diagnosis not present

## 2020-04-23 DIAGNOSIS — E0843 Diabetes mellitus due to underlying condition with diabetic autonomic (poly)neuropathy: Secondary | ICD-10-CM | POA: Diagnosis not present

## 2020-04-23 MED ORDER — GENTAMICIN SULFATE 0.1 % EX CREA
1.0000 | TOPICAL_CREAM | Freq: Two times a day (BID) | CUTANEOUS | 1 refills | Status: DC
Start: 2020-04-23 — End: 2020-07-23

## 2020-04-23 NOTE — Progress Notes (Signed)
   Subjective:  59 y.o. male with PMHx of diabetes mellitus presents today for follow-up evaluation regarding an ulcer to the right hallux.  Patient has been doing very well.  He wears OTC arch supports and composite steel toe shoes.  He states his feet feel well and he has been applying the antibiotic cream as directed.  No new complaints at this time  Past Medical History:  Diagnosis Date  . Cancer ([place])    SKIN   . Depression   . Diabetes mellitus without complication ([place])    Type II  . Dyspnea    with exertion   . Fatigue   . Hyperlipidemia   . Hypertension   . Hypogonadism male   . Neuromuscular disorder ([place])    "back nerve stimulator"  . Neuropathy   . PAD (peripheral artery disease) ([place])   . Sleep apnea    CPAP  . Tinnitus   . Tuberculosis    POSITIVE  TB SKIN TEST 1992.6 MTH TX .was exposed to someone who had it.      Objective/Physical Exam General: The patient is alert and oriented x3 in no acute distress.  Dermatology:  Wound #1 noted to the plantar aspect of the right hallux has healed.  Complete reepithelialization has occurred. The ulcer to the left plantar forefoot has completely healed.    There is no malodor. Periwound integrity is intact. Skin is warm, dry and supple bilateral lower extremities.  Vascular: Palpable pedal pulses bilaterally.  Bilateral lower extremity edema noted up to the leg.  Negative for any erythema that would be concerning for deep tissue cellulitis.  Capillary refill within normal limits.  Neurological: Epicritic and protective threshold diminished bilaterally.   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   Assessment: 1.  Ulcer right hallux secondary to diabetes mellitus-healed 2. diabetes mellitus w/ peripheral neuropathy   Plan of Care:  1. Patient was evaluated.   2.  Light debridement of the freshly healed ulcers was performed today using a tissue nipper 3.  Recommend good foot hygiene  and antibiotic cream as needed 4.  Appointment with Pedorthist for diabetic shoes and insoles 5.  Return to clinic in 3 months for routine foot care  Edrick Kins, DPM [facility]  Dr. Edrick Kins, [address]                                        [place], [place] [address]                Office [phone]  Fax [phone]

## 2020-05-08 ENCOUNTER — Other Ambulatory Visit: Payer: Self-pay

## 2020-05-08 ENCOUNTER — Ambulatory Visit: Payer: 59 | Admitting: Orthotics

## 2020-05-08 DIAGNOSIS — Z89421 Acquired absence of other right toe(s): Secondary | ICD-10-CM

## 2020-05-08 NOTE — Progress Notes (Signed)
Patient is in the middle of getting Medicare disability and wants to wait until then.

## 2020-05-19 ENCOUNTER — Other Ambulatory Visit: Payer: Self-pay | Admitting: Family Medicine

## 2020-05-19 NOTE — Telephone Encounter (Signed)
Requested Prescriptions  Pending Prescriptions Disp Refills  . hydrochlorothiazide (HYDRODIURIL) 25 MG tablet [Pharmacy Med Name: HYDROCHLOROTHIAZIDE 25 MG TAB] 30 tablet 5    Sig: TAKE 1 TABLET BY MOUTH EVERY DAY. *INSURANCE ONLY COVERS 30 DAYS**     Cardiovascular: Diuretics - Thiazide Failed - 05/19/2020 10:46 AM      Failed - Last BP in normal range    BP Readings from Last 1 Encounters:  03/21/20 (!) 147/80         Passed - Ca in normal range and within 360 days    Calcium  Date Value Ref Range Status  03/21/2020 9.1 8.6 - 10.2 mg/dL Final         Passed - Cr in normal range and within 360 days    Creat  Date Value Ref Range Status  05/05/2017 0.91 0.70 - 1.33 mg/dL Final    Comment:    For patients >34 years of age, the reference limit for Creatinine is approximately 13% higher for people identified as African-American. .    Creatinine, Ser  Date Value Ref Range Status  03/21/2020 1.25 0.76 - 1.27 mg/dL Final         Passed - K in normal range and within 360 days    Potassium  Date Value Ref Range Status  03/21/2020 4.5 3.5 - 5.2 mmol/L Final  12/28/2012 4.1 3.5 - 5.1 mmol/L Final         Passed - Na in normal range and within 360 days    Sodium  Date Value Ref Range Status  03/21/2020 135 134 - 144 mmol/L Final         Passed - Valid encounter within last 6 months    Recent Outpatient Visits          1 month ago Type 2 diabetes mellitus with complication, without long-term current use of insulin [facility])   [facility] Jerrol Banana., MD   1 year ago DDD (degenerative disc disease), cervical   [facility] Jerrol Banana., MD   1 year ago Cervical strain, acute, initial encounter   [facility] Jerrol Banana., MD   1 year ago Type 2 diabetes mellitus with diabetic polyneuropathy, without long-term current use of insulin [facility])   [facility] Jerrol Banana., MD   2 years  ago Essential (primary) hypertension   [facility] Jerrol Banana., MD      Future Appointments            In 1 month Jerrol Banana., MD [facility]

## 2020-06-09 ENCOUNTER — Other Ambulatory Visit: Payer: Self-pay | Admitting: Family Medicine

## 2020-06-09 DIAGNOSIS — I10 Essential (primary) hypertension: Secondary | ICD-10-CM

## 2020-06-09 NOTE — Telephone Encounter (Signed)
Requested Prescriptions  Pending Prescriptions Disp Refills  . metFORMIN (GLUCOPHAGE) 1000 MG tablet [Pharmacy Med Name: METFORMIN HCL 1,000 MG TABLET] 60 tablet 0    Sig: TAKE 1 TABLET BY MOUTH TWICE A DAY WITH MEALS     Endocrinology:  Diabetes - Biguanides Passed - 06/09/2020 12:11 PM      Passed - Cr in normal range and within 360 days    Creat  Date Value Ref Range Status  05/05/2017 0.91 0.70 - 1.33 mg/dL Final    Comment:    For patients >68 years of age, the reference limit for Creatinine is approximately 13% higher for people identified as African-American. .    Creatinine, Ser  Date Value Ref Range Status  03/21/2020 1.25 0.76 - 1.27 mg/dL Final         Passed - HBA1C is between 0 and 7.9 and within 180 days    Hgb A1c MFr Bld  Date Value Ref Range Status  03/21/2020 5.9 (H) 4.8 - 5.6 % Final    Comment:             Prediabetes: 5.7 - 6.4          Diabetes: >6.4          Glycemic control for adults with diabetes: <7.0          Passed - eGFR in normal range and within 360 days    GFR calc Af Amer  Date Value Ref Range Status  03/21/2020 71 >59 mL/min/1.73 Final    Comment:    **[facility] currently reports eGFR in compliance with the current**   recommendations of the [employer]. [facility] will   update reporting as new guidelines are published from the NKF-ASN   Task force.    GFR calc non Af Amer  Date Value Ref Range Status  03/21/2020 62 >59 mL/min/1.73 Final         Passed - Valid encounter within last 6 months    Recent Outpatient Visits          2 months ago Type 2 diabetes mellitus with complication, without long-term current use of insulin [facility])   [facility] Jerrol Banana., MD   1 year ago DDD (degenerative disc disease), cervical   [facility] Jerrol Banana., MD   1 year ago Cervical strain, acute, initial encounter   [facility] Jerrol Banana., MD   1 year ago  Type 2 diabetes mellitus with diabetic polyneuropathy, without long-term current use of insulin [facility])   [facility] Jerrol Banana., MD   2 years ago Essential (primary) hypertension   [facility] Jerrol Banana., MD      Future Appointments            In 4 weeks Jerrol Banana., MD [facility]           . JARDIANCE 25 MG TABS tablet [Pharmacy Med Name: JARDIANCE 25 MG TABLET] 30 tablet 12    Sig: TAKE 1 TABLET BY MOUTH DAILY     Endocrinology:  Diabetes - SGLT2 Inhibitors Failed - 06/09/2020 12:11 PM      Failed - LDL in normal range and within 360 days    LDL Cholesterol (Calc)  Date Value Ref Range Status  05/05/2017 69 mg/dL (calc) Final    Comment:    Reference range: <100 . Desirable range <100 mg/dL for primary prevention;   <  70 mg/dL for patients with CHD or diabetic patients  with > or = 2 CHD risk factors. Marland Kitchen LDL-C is now calculated using the Martin-Hopkins  calculation, which is a validated novel method providing  better accuracy than the Friedewald equation in the  estimation of LDL-C.  Cresenciano Genre et al. Annamaria Helling. 6301;601(09): 2061-2068  (http://education.[employer].com/faq/FAQ164)    LDL Chol Calc (NIH)  Date Value Ref Range Status  03/21/2020 135 (H) 0 - 99 mg/dL Final         Passed - Cr in normal range and within 360 days    Creat  Date Value Ref Range Status  05/05/2017 0.91 0.70 - 1.33 mg/dL Final    Comment:    For patients >67 years of age, the reference limit for Creatinine is approximately 13% higher for people identified as African-American. .    Creatinine, Ser  Date Value Ref Range Status  03/21/2020 1.25 0.76 - 1.27 mg/dL Final         Passed - HBA1C is between 0 and 7.9 and within 180 days    Hgb A1c MFr Bld  Date Value Ref Range Status  03/21/2020 5.9 (H) 4.8 - 5.6 % Final    Comment:             Prediabetes: 5.7 - 6.4          Diabetes: >6.4           Glycemic control for adults with diabetes: <7.0          Passed - eGFR in normal range and within 360 days    GFR calc Af Amer  Date Value Ref Range Status  03/21/2020 71 >59 mL/min/1.73 Final    Comment:    **[facility] currently reports eGFR in compliance with the current**   recommendations of the [employer]. [facility] will   update reporting as new guidelines are published from the NKF-ASN   Task force.    GFR calc non Af Amer  Date Value Ref Range Status  03/21/2020 62 >59 mL/min/1.73 Final         Passed - Valid encounter within last 6 months    Recent Outpatient Visits          2 months ago Type 2 diabetes mellitus with complication, without long-term current use of insulin [facility])   [facility] Jerrol Banana., MD   1 year ago DDD (degenerative disc disease), cervical   [facility] Jerrol Banana., MD   1 year ago Cervical strain, acute, initial encounter   [facility] Jerrol Banana., MD   1 year ago Type 2 diabetes mellitus with diabetic polyneuropathy, without long-term current use of insulin [facility])   [facility] Jerrol Banana., MD   2 years ago Essential (primary) hypertension   [facility] Jerrol Banana., MD      Future Appointments            In 4 weeks Jerrol Banana., MD [facility]           . losartan (COZAAR) 100 MG tablet [Pharmacy Med Name: LOSARTAN POTASSIUM 100 MG TAB] 30 tablet 11    Sig: TAKE 1 TABLET BY MOUTH EVERY DAY     Cardiovascular:  Angiotensin Receptor Blockers Failed - 06/09/2020 12:11 PM      Failed - Last BP in normal range    BP Readings from Last 1 Encounters:  03/21/20 (!) 147/80         Passed - Cr in normal range and within 180 days    Creat  Date Value Ref Range Status  05/05/2017 0.91 0.70 - 1.33 mg/dL Final    Comment:    For patients >6 years of age, the reference  limit for Creatinine is approximately 13% higher for people identified as African-American. .    Creatinine, Ser  Date Value Ref Range Status  03/21/2020 1.25 0.76 - 1.27 mg/dL Final         Passed - K in normal range and within 180 days    Potassium  Date Value Ref Range Status  03/21/2020 4.5 3.5 - 5.2 mmol/L Final  12/28/2012 4.1 3.5 - 5.1 mmol/L Final         Passed - Patient is not pregnant      Passed - Valid encounter within last 6 months    Recent Outpatient Visits          2 months ago Type 2 diabetes mellitus with complication, without long-term current use of insulin [facility])   [facility] Jerrol Banana., MD   1 year ago DDD (degenerative disc disease), cervical   [facility] Jerrol Banana., MD   1 year ago Cervical strain, acute, initial encounter   [facility] Jerrol Banana., MD   1 year ago Type 2 diabetes mellitus with diabetic polyneuropathy, without long-term current use of insulin [facility])   [facility] Jerrol Banana[facility] MD   2 years ago Essential (primary) hypertension   [facility] Jerrol Banana., MD      Future Appointments            In 4 weeks Jerrol Banana., MD [facility]

## 2020-06-16 ENCOUNTER — Other Ambulatory Visit: Payer: Self-pay | Admitting: Family Medicine

## 2020-06-16 DIAGNOSIS — F320 Major depressive disorder, single episode, mild: Secondary | ICD-10-CM

## 2020-06-16 DIAGNOSIS — I10 Essential (primary) hypertension: Secondary | ICD-10-CM

## 2020-06-16 NOTE — Telephone Encounter (Signed)
Requested medications are due for refill today? Pharmacy is requesting 90 day supply.  This medication refill cannot be delegated.    Requested medications are on active medication list?  Yes  Last Refill:   09/05/2019  # 30 with 11 refills   Future visit scheduled?  Yes  Notes to Clinic:  This medication refill cannot be delegated.

## 2020-07-05 ENCOUNTER — Telehealth: Payer: Self-pay

## 2020-07-05 NOTE — Telephone Encounter (Signed)
Copied from [facility] [id number]. Topic: General - Other >> Jul 05, 2020 12:20 PM Celene Kras wrote: Reason for CRM: Pt calling stating that he received an automated VM stating that he needed to call in and have his A1C checked before his appt. No lab orders are placed. Please advise.

## 2020-07-08 ENCOUNTER — Encounter: Payer: Self-pay | Admitting: Family Medicine

## 2020-07-09 NOTE — Telephone Encounter (Signed)
We will get at the time of this visit.

## 2020-07-10 NOTE — Telephone Encounter (Signed)
Patient rescheduled appt to March 2022.

## 2020-07-13 ENCOUNTER — Other Ambulatory Visit: Payer: Self-pay | Admitting: Family Medicine

## 2020-07-13 DIAGNOSIS — R0602 Shortness of breath: Secondary | ICD-10-CM

## 2020-07-13 NOTE — Telephone Encounter (Signed)
Requested Prescriptions  Pending Prescriptions Disp Refills  . venlafaxine (EFFEXOR) 75 MG tablet [Pharmacy Med Name: VENLAFAXINE HCL 75 MG TABLET] 270 tablet 4    Sig: TAKE 3 TABLETS (225 MG TOTAL) BY MOUTH DAILY.     Psychiatry: Antidepressants - SNRI - desvenlafaxine & venlafaxine Failed - 07/13/2020  8:32 AM      Failed - LDL in normal range and within 360 days    LDL Cholesterol (Calc)  Date Value Ref Range Status  05/05/2017 69 mg/dL (calc) Final    Comment:    Reference range: <100 . Desirable range <100 mg/dL for primary prevention;   <70 mg/dL for patients with CHD or diabetic patients  with > or = 2 CHD risk factors. Marland Kitchen LDL-C is now calculated using the Martin-Hopkins  calculation, which is a validated novel method providing  better accuracy than the Friedewald equation in the  estimation of LDL-C.  Cresenciano Genre et al. Annamaria Helling. 8676;195(09): 2061-2068  (http://education.[facility].com/faq/FAQ164)    LDL Chol Calc (NIH)  Date Value Ref Range Status  03/21/2020 135 (H) 0 - 99 mg/dL Final         Failed - Completed PHQ-2 or PHQ-9 in the last 360 days      Failed - Last BP in normal range    BP Readings from Last 1 Encounters:  03/21/20 (!) 147/80         Passed - Total Cholesterol in normal range and within 360 days    Cholesterol, Total  Date Value Ref Range Status  03/21/2020 189 100 - 199 mg/dL Final         Passed - Triglycerides in normal range and within 360 days    Triglycerides  Date Value Ref Range Status  03/21/2020 134 0 - 149 mg/dL Final         Passed - Valid encounter within last 6 months    Recent Outpatient Visits          3 months ago Type 2 diabetes mellitus with complication, without long-term current use of insulin [facility])   [facility] Jerrol Banana., MD   1 year ago DDD (degenerative disc disease), cervical   [facility] Jerrol Banana., MD   1 year ago Cervical strain, acute, initial  encounter   [facility] Jerrol Banana., MD   1 year ago Type 2 diabetes mellitus with diabetic polyneuropathy, without long-term current use of insulin [facility])   [facility] Jerrol Banana., MD   2 years ago Essential (primary) hypertension   [facility] Jerrol Banana., MD      Future Appointments            In 3 months Jerrol Banana., MD [facility] [place], [place]

## 2020-07-13 NOTE — Telephone Encounter (Signed)
Requested Prescriptions  Pending Prescriptions Disp Refills  . JARDIANCE 25 MG TABS tablet [Pharmacy Med Name: JARDIANCE 25 MG TABLET] 90 tablet 0    Sig: TAKE 1 TABLET BY MOUTH EVERY DAY     Endocrinology:  Diabetes - SGLT2 Inhibitors Failed - 07/13/2020  3:17 PM      Failed - LDL in normal range and within 360 days    LDL Cholesterol (Calc)  Date Value Ref Range Status  05/05/2017 69 mg/dL (calc) Final    Comment:    Reference range: <100 . Desirable range <100 mg/dL for primary prevention;   <70 mg/dL for patients with CHD or diabetic patients  with > or = 2 CHD risk factors. Marland Kitchen LDL-C is now calculated using the Martin-Hopkins  calculation, which is a validated novel method providing  better accuracy than the Friedewald equation in the  estimation of LDL-C.  Cresenciano Genre et al. Annamaria Helling. 0354;656(81): 2061-2068  (http://education.[facility].com/faq/FAQ164)    LDL Chol Calc (NIH)  Date Value Ref Range Status  03/21/2020 135 (H) 0 - 99 mg/dL Final         Passed - Cr in normal range and within 360 days    Creat  Date Value Ref Range Status  05/05/2017 0.91 0.70 - 1.33 mg/dL Final    Comment:    For patients >86 years of age, the reference limit for Creatinine is approximately 13% higher for people identified as African-American. .    Creatinine, Ser  Date Value Ref Range Status  03/21/2020 1.25 0.76 - 1.27 mg/dL Final         Passed - HBA1C is between 0 and 7.9 and within 180 days    Hgb A1c MFr Bld  Date Value Ref Range Status  03/21/2020 5.9 (H) 4.8 - 5.6 % Final    Comment:             Prediabetes: 5.7 - 6.4          Diabetes: >6.4          Glycemic control for adults with diabetes: <7.0          Passed - eGFR in normal range and within 360 days    GFR calc Af Amer  Date Value Ref Range Status  03/21/2020 71 >59 mL/min/1.73 Final    Comment:    **[facility] currently reports eGFR in compliance with the current**   recommendations of the [employer]. [facility] will   update reporting as new guidelines are published from the NKF-ASN   Task force.    GFR calc non Af Amer  Date Value Ref Range Status  03/21/2020 62 >59 mL/min/1.73 Final         Passed - Valid encounter within last 6 months    Recent Outpatient Visits          3 months ago Type 2 diabetes mellitus with complication, without long-term current use of insulin [facility])   [facility] Jerrol Banana., MD   1 year ago DDD (degenerative disc disease), cervical   [facility] Jerrol Banana., MD   1 year ago Cervical strain, acute, initial encounter   [facility] Jerrol Banana., MD   1 year ago Type 2 diabetes mellitus with diabetic polyneuropathy, without long-term current use of insulin [facility])   [facility] Jerrol Banana., MD   2 years ago Essential (primary) hypertension   [place], Retia Passe.,  MD      Future Appointments            In 3 months Jerrol Banana., MD [facility] [place]

## 2020-07-23 ENCOUNTER — Other Ambulatory Visit: Payer: Self-pay

## 2020-07-23 ENCOUNTER — Ambulatory Visit: Payer: 59 | Admitting: Podiatry

## 2020-07-23 DIAGNOSIS — L97522 Non-pressure chronic ulcer of other part of left foot with fat layer exposed: Secondary | ICD-10-CM | POA: Diagnosis not present

## 2020-07-23 DIAGNOSIS — E0843 Diabetes mellitus due to underlying condition with diabetic autonomic (poly)neuropathy: Secondary | ICD-10-CM

## 2020-07-23 MED ORDER — GENTAMICIN SULFATE 0.1 % EX CREA: 1.0000 "application " | TOPICAL_CREAM | Freq: Two times a day (BID) | CUTANEOUS | 1 refills | Status: DC

## 2020-07-23 NOTE — Progress Notes (Signed)
   Subjective:  59 y.o. male with PMHx of diabetes mellitus presenting today for routine foot care.  Patient is completely neuropathic and has a history of ulcers.  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  He presents for routine follow-up foot care.  He has no concerns at this moment   Past Medical History:  Diagnosis Date  . Cancer (HCC)    SKIN   . Depression   . Diabetes mellitus without complication (HCC)    Type II  . Dyspnea    with exertion   . Fatigue   . Hyperlipidemia   . Hypertension   . Hypogonadism male   . Neuromuscular disorder (HCC)    "back nerve stimulator"  . Neuropathy   . PAD (peripheral artery disease) (HCC)   . Sleep apnea    CPAP  . Tinnitus   . Tuberculosis    POSITIVE  TB SKIN TEST 1992.6 MTH [place] .was exposed to someone who had it.      Objective/Physical Exam General: The patient is alert and oriented x3 in no acute distress.  Dermatology:  Wound #1 noted to the nailbed of the left hallux measuring approximately 1.5 x 1.5 x 0.1 cm (LxWxD).   To the noted ulceration(s), there is no eschar. There is a moderate amount of slough, fibrin, and necrotic tissue noted. Granulation tissue and wound base is red. There is a minimal amount of serosanguineous drainage noted. There is no exposed bone muscle-tendon ligament or joint. There is no malodor. Periwound integrity is intact. Skin is warm, dry and supple bilateral lower extremities.  Vascular: Palpable pedal pulses bilaterally. No edema or erythema noted. Capillary refill within normal limits.  Neurological: Epicritic and protective threshold diminished bilaterally.   Musculoskeletal Exam: Range of motion within normal limits to all pedal and ankle joints bilateral. Muscle strength 5/5 in all groups bilateral.   Assessment: 1.  Ulcer left hallux secondary to diabetes mellitus 2. diabetes mellitus w/ peripheral neuropathy   Plan of Care:  1. Patient was evaluated. 2. medically  necessary excisional debridement including subcutaneous tissue was performed using a tissue nipper and a chisel blade. Excisional debridement of all the necrotic nonviable tissue down to healthy bleeding viable tissue was performed with post-debridement measurements same as pre-. 3. the wound was cleansed and dry sterile dressing applied. 4.  Continue gentamicin cream and a light dressing daily  5.  Patient is to return to clinic in 4 weeks   Tayten Bergdoll M. Shermika Balthaser, DPM [facility]  Dr. Zyion Leidner M. Canna Nickelson, DPM    [address]                                       [place], [place] [address]                Office [phone]  Fax [phone]     

## 2020-10-06 ENCOUNTER — Other Ambulatory Visit: Payer: Self-pay | Admitting: Family Medicine

## 2020-10-06 DIAGNOSIS — R0602 Shortness of breath: Secondary | ICD-10-CM

## 2020-10-06 NOTE — Telephone Encounter (Signed)
Requested Prescriptions  Pending Prescriptions Disp Refills  . venlafaxine (EFFEXOR) 75 MG tablet [Pharmacy Med Name: VENLAFAXINE HCL 75 MG TABLET] 36 tablet 0    Sig: TAKE 3 TABLETS (225 MG TOTAL) BY MOUTH DAILY.     Psychiatry: Antidepressants - SNRI - desvenlafaxine & venlafaxine Failed - 10/06/2020  9:20 AM      Failed - LDL in normal range and within 360 days    LDL Cholesterol (Calc)  Date Value Ref Range Status  05/05/2017 69 mg/dL (calc) Final    Comment:    Reference range: <100 . Desirable range <100 mg/dL for primary prevention;   <70 mg/dL for patients with CHD or diabetic patients  with > or = 2 CHD risk factors. Marland Kitchen LDL-C is now calculated using the Martin-Hopkins  calculation, which is a validated novel method providing  better accuracy than the Friedewald equation in the  estimation of LDL-C.  Cresenciano Genre et al. Annamaria Helling. 5056;979(48): 2061-2068  (http://education.[facility].com/faq/FAQ164)    LDL Chol Calc (NIH)  Date Value Ref Range Status  03/21/2020 135 (H) 0 - 99 mg/dL Final         Failed - Completed PHQ-2 or PHQ-9 in the last 360 days      Failed - Last BP in normal range    BP Readings from Last 1 Encounters:  03/21/20 (!) 147/80         Failed - Valid encounter within last 6 months    Recent Outpatient Visits          6 months ago Type 2 diabetes mellitus with complication, without long-term current use of insulin [facility])   [facility] Jerrol Banana., MD   1 year ago DDD (degenerative disc disease), cervical   [facility] Jerrol Banana., MD   1 year ago Cervical strain, acute, initial encounter   [facility] Jerrol Banana., MD   2 years ago Type 2 diabetes mellitus with diabetic polyneuropathy, without long-term current use of insulin ([place])   [facility] Jerrol Banana., MD   2 years ago Essential (primary) hypertension   [facility] Jerrol Banana., MD      Future Appointments            In 2 weeks Jerrol Banana., MD [facility]           Passed - Total Cholesterol in normal range and within 360 days    Cholesterol, Total  Date Value Ref Range Status  03/21/2020 189 100 - 199 mg/dL Final         Passed - Triglycerides in normal range and within 360 days    Triglycerides  Date Value Ref Range Status  03/21/2020 134 0 - 149 mg/dL Final         . JARDIANCE 25 MG TABS tablet [Pharmacy Med Name: JARDIANCE 25 MG TABLET] 12 tablet 0    Sig: TAKE 1 TABLET BY [place]     Endocrinology:  Diabetes - SGLT2 Inhibitors Failed - 10/06/2020  9:20 AM      Failed - LDL in normal range and within 360 days    LDL Cholesterol (Calc)  Date Value Ref Range Status  05/05/2017 69 mg/dL (calc) Final    Comment:    Reference range: <100 . Desirable range <100 mg/dL for primary prevention;   <70 mg/dL for patients with CHD or diabetic patients  with >  or = 2 CHD risk factors. Marland Kitchen LDL-C is now calculated using the Martin-Hopkins  calculation, which is a validated novel method providing  better accuracy than the Friedewald equation in the  estimation of LDL-C.  Cresenciano Genre et al. Annamaria Helling. 7253;664(40): 2061-2068  (http://education.[facility].com/faq/FAQ164)    LDL Chol Calc (NIH)  Date Value Ref Range Status  03/21/2020 135 (H) 0 - 99 mg/dL Final         Failed - HBA1C is between 0 and 7.9 and within 180 days    Hgb A1c MFr Bld  Date Value Ref Range Status  03/21/2020 5.9 (H) 4.8 - 5.6 % Final    Comment:             Prediabetes: 5.7 - 6.4          Diabetes: >6.4          Glycemic control for adults with diabetes: <7.0          Failed - Valid encounter within last 6 months    Recent Outpatient Visits          6 months ago Type 2 diabetes mellitus with complication, without long-term current use of insulin [facility])   [facility] Jerrol Banana., MD   1 year  ago DDD (degenerative disc disease), cervical   [facility] Jerrol Banana., MD   1 year ago Cervical strain, acute, initial encounter   [facility] Jerrol Banana., MD   2 years ago Type 2 diabetes mellitus with diabetic polyneuropathy, without long-term current use of insulin [facility])   [facility] Jerrol Banana., MD   2 years ago Essential (primary) hypertension   [facility] Jerrol Banana., MD      Future Appointments            In 2 weeks Jerrol Banana., MD [facility]           Passed - Cr in normal range and within 360 days    Creat  Date Value Ref Range Status  05/05/2017 0.91 0.70 - 1.33 mg/dL Final    Comment:    For patients >37 years of age, the reference limit for Creatinine is approximately 13% higher for people identified as African-American. .    Creatinine, Ser  Date Value Ref Range Status  03/21/2020 1.25 0.76 - 1.27 mg/dL Final         Passed - eGFR in normal range and within 360 days    GFR calc Af Amer  Date Value Ref Range Status  03/21/2020 71 >59 mL/min/1.73 Final    Comment:    **[facility] currently reports eGFR in compliance with the current**   recommendations of the [employer]. [facility] will   update reporting as new guidelines are published from the NKF-ASN   Task force.    GFR calc non Af Amer  Date Value Ref Range Status  03/21/2020 62 >59 mL/min/1.73 Final

## 2020-10-18 ENCOUNTER — Other Ambulatory Visit: Payer: Self-pay | Admitting: Family Medicine

## 2020-10-18 DIAGNOSIS — I10 Essential (primary) hypertension: Secondary | ICD-10-CM

## 2020-10-22 ENCOUNTER — Ambulatory Visit: Payer: 59 | Admitting: Podiatry

## 2020-10-22 ENCOUNTER — Other Ambulatory Visit: Payer: Self-pay

## 2020-10-22 ENCOUNTER — Encounter: Payer: Self-pay | Admitting: Family Medicine

## 2020-10-22 ENCOUNTER — Ambulatory Visit (INDEPENDENT_AMBULATORY_CARE_PROVIDER_SITE_OTHER): Payer: 59 | Admitting: Family Medicine

## 2020-10-22 VITALS — BP 108/68 | HR 52 | Temp 97.9°F | Resp 16 | Ht 68.0 in | Wt 213.0 lb

## 2020-10-22 DIAGNOSIS — L089 Local infection of the skin and subcutaneous tissue, unspecified: Secondary | ICD-10-CM

## 2020-10-22 DIAGNOSIS — Z0000 Encounter for general adult medical examination without abnormal findings: Secondary | ICD-10-CM

## 2020-10-22 DIAGNOSIS — L98491 Non-pressure chronic ulcer of skin of other sites limited to breakdown of skin: Secondary | ICD-10-CM | POA: Diagnosis not present

## 2020-10-22 DIAGNOSIS — R5382 Chronic fatigue, unspecified: Secondary | ICD-10-CM | POA: Diagnosis not present

## 2020-10-22 DIAGNOSIS — Z23 Encounter for immunization: Secondary | ICD-10-CM

## 2020-10-22 DIAGNOSIS — H60543 Acute eczematoid otitis externa, bilateral: Secondary | ICD-10-CM | POA: Diagnosis not present

## 2020-10-22 DIAGNOSIS — I2510 Atherosclerotic heart disease of native coronary artery without angina pectoris: Secondary | ICD-10-CM

## 2020-10-22 DIAGNOSIS — L97522 Non-pressure chronic ulcer of other part of left foot with fat layer exposed: Secondary | ICD-10-CM | POA: Diagnosis not present

## 2020-10-22 DIAGNOSIS — M5417 Radiculopathy, lumbosacral region: Secondary | ICD-10-CM

## 2020-10-22 DIAGNOSIS — L570 Actinic keratosis: Secondary | ICD-10-CM | POA: Diagnosis not present

## 2020-10-22 DIAGNOSIS — R234 Changes in skin texture: Secondary | ICD-10-CM | POA: Diagnosis not present

## 2020-10-22 DIAGNOSIS — E0843 Diabetes mellitus due to underlying condition with diabetic autonomic (poly)neuropathy: Secondary | ICD-10-CM | POA: Diagnosis not present

## 2020-10-22 DIAGNOSIS — I10 Essential (primary) hypertension: Secondary | ICD-10-CM

## 2020-10-22 DIAGNOSIS — E1142 Type 2 diabetes mellitus with diabetic polyneuropathy: Secondary | ICD-10-CM

## 2020-10-22 DIAGNOSIS — I739 Peripheral vascular disease, unspecified: Secondary | ICD-10-CM

## 2020-10-22 DIAGNOSIS — F3341 Major depressive disorder, recurrent, in partial remission: Secondary | ICD-10-CM

## 2020-10-22 DIAGNOSIS — N529 Male erectile dysfunction, unspecified: Secondary | ICD-10-CM

## 2020-10-22 MED ORDER — MOMETASONE FUROATE 0.1 % EX CREA: 1.0000 "application " | TOPICAL_CREAM | Freq: Every day | CUTANEOUS | 0 refills | Status: DC

## 2020-10-22 NOTE — Progress Notes (Signed)
Complete physical exam   Patient: Jeff [name]   DOB: 01-22-1961   60 y.o. Male  MRN: [id number] Visit Date: 10/22/2020  Today's healthcare provider: [name], MD   Chief Complaint  Patient presents with  . Annual Exam   Subjective    Jeff [name] is a 60 y.o. male who presents today for a complete physical exam.  He reports consuming a general diet. The patient does not participate in regular exercise at present. He generally feels fairly well. He reports sleeping fairly well. He does have additional problems to discuss today.  He continues to see 2 different pain specialist. He sees Dr. Nicole Kindred from dermatology. He has chronic fatigue, chronic back pain, chronic thick postnasal drainage.  He has complete erectile dysfunction and chronic itching in his ears. Disease podiatry for his feet is lost all his toenails.  Has had the right fourth and fifth toes amputated.   Past Medical History:  Diagnosis Date  . Cancer ([place])    SKIN   . Depression   . Diabetes mellitus without complication ([place])    Type II  . Dyspnea    with exertion   . Fatigue   . Hyperlipidemia   . Hypertension   . Hypogonadism male   . Neuromuscular disorder ([place])    "back nerve stimulator"  . Neuropathy   . PAD (peripheral artery disease) ([place])   . Sleep apnea    CPAP  . Tinnitus   . Tuberculosis    POSITIVE  TB SKIN TEST 1992.6 MTH TX .was exposed to someone who had it.   Past Surgical History:  Procedure Laterality Date  . AMPUTATION TOE Right 03/04/2018   Procedure: AMPUTATION TOE/MPJ JOINT FIFTH RIGHT;  Surgeon: Edrick Kins, DPM;  Location: [place];  Service: Podiatry;  Laterality: Right;  . BACK SURGERY  2012   neck was 2012,back same year. plate in T0-1.SWFUXNATFT  . CARDIAC CATHETERIZATION  01/31/2013   Medical management  . CARPAL TUNNEL RELEASE Bilateral   . CATARACT EXTRACTION Right 2014  . CATARACT EXTRACTION W/PHACO Left 03/12/2016   Procedure: CATARACT EXTRACTION  PHACO AND INTRAOCULAR LENS PLACEMENT (IOC);  Surgeon: Birder Robson, MD;  Location: [facility];  Service: Ophthalmology;  Laterality: Left;  [place] 00:31AP% 17.9CDE 5.67Fluid pack lot # [id number]  . CERVICAL FUSION  2012  . CORONARY ANGIOPLASTY  2014   all good  . KNEE ARTHROSCOPY Left   . LEFT HEART CATHETERIZATION WITH CORONARY ANGIOGRAM N/A 01/31/2013   Procedure: LEFT HEART CATHETERIZATION WITH CORONARY ANGIOGRAM;  Surgeon: Minus Breeding, MD;  Location[facility] C[facility] LAB;  Service: Cardiovascular;  Laterality: N/A;  . LUMBAR LAMINECTOMY/DECOMPRESSION MICRODISCECTOMY  06/22/2011   Procedure: LUMBAR LAMINECTOMY/DECOMPRESSION MICRODISCECTOMY;  Surgeon: Ophelia Charter;  Location: [place] [facility]URO ORS;  Service: Neurosurgery;  Laterality: N/A;  Thoracic Ten-Eleven,Thoracic Eleven-Twelve Laminectomy  . Pain stimulator    . ulnar N/A   . VASECTOMY  1991   Social History   Socioeconomic History  . Marital status: Widowed    Spouse name: Not on file  . Number of children: 3  . Years of education: 15  . Highest education level: Not on file  Occupational History  . Occupation: works at [employer]  . Occupation: Software engineer: [employer]  Tobacco Use  . Smoking status: Former Smoker    Packs/day: 1.50    Years: 30.00    Pack years: 45.00    Types: Cigarettes  Quit date: 2010    Years since quitting: 12.2  . Smokeless tobacco: Former Systems developer    Quit date: 12/16/1995  Vaping Use  . Vaping Use: Never used  Substance and Sexual Activity  . Alcohol use: No    Alcohol/week: 1.0 standard drink    Types: 1 Standard drinks or equivalent per week    Comment: Beer  . Drug use: No  . Sexual activity: Yes    Birth control/protection: None  Other Topics Concern  . Not on file  Social History Narrative   Lives at home alone, widowed   Right-handed   Caffeine: tea and soft drinks   Social Determinants of Health   Financial Resource Strain: Not on file  Food Insecurity: Not  on file  Transportation Needs: Not on file  Physical Activity: Not on file  Stress: Not on file  Social Connections: Not on file  Intimate Partner Violence: Not on file   Family Status  Relation Name Status  . Father  Alive  . Mother  Deceased  . Daughter  Alive  . Sister  Alive  . Son  Alive  . Sister  Alive  . Mat Uncle  (Not Specified)  . MGM  (Not Specified)   Family History  Problem Relation Age of Onset  . Coronary artery disease Father        PPM in his late 47s, CAD Dx 26s  . Hyperlipidemia Father   . Hypertension Father   . Heart disease Father        CABG at age 57  . Diabetes Father   . Atrial fibrillation Mother   . Hypertension Mother   . Transient ischemic attack Mother   . Dementia Mother   . Migraines Daughter   . Cancer Maternal Uncle        Throat  . Cancer Maternal Grandmother        Lung cancer   Allergies  Allergen Reactions  . Topamax [Topiramate] Other (See Comments)    unresponsive  Episodes ? SYNCOPE ?  . Keppra [Levetiracetam] Palpitations and Other (See Comments)    "jittery", and "loopy." per pt.  . Neurontin [Gabapentin] Other (See Comments)    TREMORS "jittery" and "loopy" per pt.  Marland Kitchen Cymbalta [Duloxetine Hcl] Other (See Comments)    "jittery" and "loopy" per pt    Patient Care Team: Jerrol Banana., MD as PCP - General (Family Medicine) Newman Pies, MD as Consulting Physician (Neurosurgery) Mohammed Kindle, MD as Attending Physician (Pain Medicine)   Medications: Outpatient Medications Prior to Visit  Medication Sig  . ARIPiprazole (ABILIFY) 5 MG tablet TAKE 1 TABLET BY MOUTH EVERY DAY  . aspirin 81 MG tablet Take 81 mg by mouth daily.  Marland Kitchen atorvastatin (LIPITOR) 40 MG tablet Take 1 tablet (40 mg total) by mouth daily.  . clomiPHENE (CLOMID) 50 MG tablet Take 0.5 tablets (25 mg total) by mouth daily.  Marland Kitchen gentamicin cream (GARAMYCIN) 0.1 % Apply 1 application topically 2 (two) times daily.  . hydrochlorothiazide  (HYDRODIURIL) 25 MG tablet TAKE 1 TABLET BY MOUTH EVERY DAY. *INSURANCE ONLY COVERS 30 DAYS**  . HYDROcodone-acetaminophen (NORCO/VICODIN) 5-325 MG tablet Take 1 tablet by mouth every 6 (six) hours as needed for moderate pain.  . baclofen (LIORESAL) 10 MG tablet TAKE 1 TABLET BY MOUTH 3 TIMES A DAY AS NEEDED FOR SPASMS (Patient not taking: No sig reported)  . doxycycline (VIBRA-TABS) 100 MG tablet Take 1 tablet (100 mg total) by mouth 2 (two)  times daily. (Patient not taking: Reported on 10/22/2020)  . hydrocortisone 2.5 % cream Apply topically.  Marland Kitchen ibuprofen (ADVIL) 400 MG tablet Take 400 mg by mouth 3 (three) times daily as needed.  Marland Kitchen JARDIANCE 25 MG TABS tablet TAKE 1 TABLET BY MOUTH EVERY DAY  . losartan (COZAAR) 100 MG tablet TAKE 1 TABLET BY MOUTH EVERY DAY  . LYRICA 100 MG capsule LIMIT 1 CAPSULE BY MOUTH 3 - 5 TIMES PER DAY IF TOLERATED (NOTE THAT CAPSULE IS NOW 100 MG SIZE)  . metFORMIN (GLUCOPHAGE) 1000 MG tablet TAKE 1 TABLET BY MOUTH TWICE A DAY WITH MEALS  . metoprolol succinate (TOPROL-XL) 50 MG 24 hr tablet TAKE 1 TABLET BY MOUTH EVERY DAY  . ONETOUCH VERIO test strip CHECK SUGAR ONCE DAILY DX E11.9  . orphenadrine (NORFLEX) 100 MG tablet Take 100 mg by mouth 2 (two) times daily.  . Oxycodone HCl 10 MG TABS LIMIT 1 TABLET BY MOUTH 4   6 TIMES PER DAY IF TOLERATED,  . oxyCODONE-acetaminophen (PERCOCET) 5-325 MG tablet Take 1 tablet by mouth every 6 (six) hours as needed for severe pain.  . sildenafil (REVATIO) 20 MG tablet TAKE 1 TO 5 TABLETS BY MOUTH DAILY AS NEEDED  . venlafaxine (EFFEXOR) 75 MG tablet TAKE 3 TABLETS (225 MG TOTAL) BY MOUTH DAILY.   No facility-administered medications prior to visit.    Review of Systems  Constitutional: Negative.   HENT: Positive for postnasal drip.   Eyes: Negative.   Respiratory: Negative.   Cardiovascular: Negative.   Gastrointestinal: Negative.   Endocrine: Negative.   Genitourinary: Negative.   Musculoskeletal: Negative.   Skin:  Negative.   Allergic/Immunologic: Negative.   Neurological: Negative.   Hematological: Negative.   Psychiatric/Behavioral: Negative.   All other systems reviewed and are negative.      Objective    BP 108/68   Pulse (!) 52   Temp 97.9 F (36.6 C)   Resp 16   Ht 5\' 8"  (1.727 m)   Wt 213 lb (96.6 kg)   BMI 32.39 kg/m  BP Readings from Last 3 Encounters:  10/22/20 108/68  03/21/20 (!) 147/80  05/10/19 138/82   Wt Readings from Last 3 Encounters:  10/22/20 213 lb (96.6 kg)  03/21/20 213 lb (96.6 kg)  05/10/19 220 lb (99.8 kg)      Physical Exam Vitals reviewed.  Constitutional:      Appearance: He is well-developed.  HENT:     Head: Normocephalic and atraumatic.     Right Ear: External ear normal.     Left Ear: External ear normal.     Nose: Nose normal.  Eyes:     Conjunctiva/sclera: Conjunctivae normal.     Pupils: Pupils are equal, round, and reactive to light.  Cardiovascular:     Rate and Rhythm: Normal rate and regular rhythm.     Heart sounds: Normal heart sounds.  Pulmonary:     Effort: Pulmonary effort is normal.     Breath sounds: Normal breath sounds.  Abdominal:     General: Bowel sounds are normal.     Palpations: Abdomen is soft.  Genitourinary:    Penis: Normal.      Prostate: Normal.     Rectum: Normal.  Musculoskeletal:        General: Normal range of motion.     Cervical back: Normal range of motion and neck supple.     Comments: He is status post amputation of the right fourth and fifth toes.  He has no toenails that will grow.  Skin:    General: Skin is warm and dry.     Comments: Large oval birthmark left upper abdomen. Multiple actinic keratoses and sun damage  Neurological:     General: No focal deficit present.     Mental Status: He is alert and oriented to person, place, and time.     Comments: Decreased sensation and forefoot bilaterally.  Psychiatric:        Mood and Affect: Mood normal.        Behavior: Behavior normal.         Thought Content: Thought content normal.        Judgment: Judgment normal.       Last depression screening scores PHQ 2/9 Scores 10/22/2020 12/07/2017 08/11/2017  PHQ - 2 Score 0 2 2  PHQ- 9 Score 5 11 7    Last fall risk screening Fall Risk  10/22/2020  Falls in the past year? 1  Comment -  Number falls in past yr: 0  Injury with Fall? 0  Comment -  Risk for fall due to : No Fall Risks  Follow up Falls evaluation completed   Last Audit-C alcohol use screening Alcohol Use Disorder Test (AUDIT) 10/22/2020  1. How often do you have a drink containing alcohol? 0  2. How many drinks containing alcohol do you have on a typical day when you are drinking? 0  3. How often do you have six or more drinks on one occasion? 0  AUDIT-C Score 0  Alcohol Brief Interventions/Follow-up AUDIT Score <7 follow-up not indicated   A score of 3 or more in women, and 4 or more in men indicates increased risk for alcohol abuse, EXCEPT if all of the points are from question 1   No results found for any visits on 10/22/20.  Assessment & Plan    Routine Health Maintenance and Physical Exam  Exercise Activities and Dietary recommendations Goals   None     Immunization History  Administered Date(s) Administered  . Influenza Split 07/05/2004  . Influenza,inj,Quad PF,6+ Mos 05/16/2014, 05/14/2015, 04/02/2017  . Influenza-Unspecified 04/16/2016, 04/03/2018  . Pneumococcal Polysaccharide-23 01/21/2012  . Tdap 01/21/2012, 01/02/2019    Health Maintenance  Topic Date Due  . COVID-19 Vaccine (1) Never done  . FOOT EXAM  Never done  . HIV Screening  Never done  . OPHTHALMOLOGY EXAM  07/29/2019  . INFLUENZA VACCINE  03/03/2020  . HEMOGLOBIN A1C  09/21/2020  . COLONOSCOPY (Pts 45-9yrs Insurance coverage will need to be confirmed)  04/21/2023  . TETANUS/TDAP  01/01/2029  . PNEUMOCOCCAL POLYSACCHARIDE VACCINE AGE 18-64 HIGH RISK  Completed  . Hepatitis C Screening  Completed  . HPV VACCINES  Aged  Out    Discussed health benefits of physical activity, and encouraged him to engage in regular exercise appropriate for his age and condition.  1. Annual physical exam  - CBC with Differential/Platelet - Comprehensive metabolic panel - Hemoglobin A1c - Lipid panel - TSH - PSA - POCT urinalysis dipstick  2. Flu vaccine need  - Flu Vaccine QUAD 6+ mos PF IM (Fluarix Quad PF)  3. AK (actinic keratosis) Refer back to Dr. Nicole Kindred - Ambulatory referral to Dermatology  4. Chronic fatigue   5. Eczema of both external ears Try 1 week out of the month mometasone cream - mometasone (ELOCON) 0.1 % cream; Apply 1 application topically daily.  Dispense: 45 g; Refill: 0  6. ASCVD (arteriosclerotic cardiovascular disease) All risk factors  treated  7. Essential (primary) hypertension Controlled  8. Peripheral vascular disease ([place]) With amputation of the right fourth and fifth toes  9. Type 2 diabetes mellitus with diabetic polyneuropathy, without long-term current use of insulin (HCC) Follow A1c in 6 months this is been well controlled  10. L-S radiculopathy By pain clinic  11. Recurrent major depressive disorder, in partial remission ([place]) In partial remission on venlafaxine  12. Erectile dysfunction, unspecified erectile dysfunction type Plan to complement on the next visit.   No follow-ups on file.     I, [name], MD, have reviewed all documentation for this visit. The documentation on 10/23/20 for the exam, diagnosis, procedures, and orders are all accurate and complete.    Naftula Donahue Cranford Mon, MD  [facility] [phone] (phone) [phone] (fax)  [place]

## 2020-10-22 NOTE — Progress Notes (Signed)
   Subjective:  60 y.o. male with PMHx of diabetes mellitus presenting today for routine foot care.  Patient is completely neuropathic and has a history of ulcers.  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  He presents for routine follow-up foot care.  He has no concerns at this moment   Past Medical History:  Diagnosis Date  . Cancer ([place])    SKIN   . Depression   . Diabetes mellitus without complication ([place])    Type II  . Dyspnea    with exertion   . Fatigue   . Hyperlipidemia   . Hypertension   . Hypogonadism male   . Neuromuscular disorder ([place])    "back nerve stimulator"  . Neuropathy   . PAD (peripheral artery disease) ([place])   . Sleep apnea    CPAP  . Tinnitus   . Tuberculosis    POSITIVE  TB SKIN TEST 1992.6 MTH TX .was exposed to someone who had it.      Objective/Physical Exam General: The patient is alert and oriented x3 in no acute distress.  Dermatology:  Wound #1 noted to the nailbed of the left hallux measuring approximately 1.5 x 1.5 x 0.1 cm (LxWxD).   To the noted ulceration(s), there is no eschar. There is a moderate amount of slough, fibrin, and necrotic tissue noted. Granulation tissue and wound base is red. There is a minimal amount of serosanguineous drainage noted. There is no exposed bone muscle-tendon ligament or joint. There is no malodor. Periwound integrity is intact. Skin is warm, dry and supple bilateral lower extremities.  Vascular: Palpable pedal pulses bilaterally. No edema or erythema noted. Capillary refill within normal limits.  Neurological: Epicritic and protective threshold diminished bilaterally.   Musculoskeletal Exam: Range of motion within normal limits to all pedal and ankle joints bilateral. Muscle strength 5/5 in all groups bilateral.   Assessment: 1.  Ulcer left hallux secondary to diabetes mellitus 2. diabetes mellitus w/ peripheral neuropathy   Plan of Care:  1. Patient was evaluated. 2. medically  necessary excisional debridement including subcutaneous tissue was performed using a tissue nipper and a chisel blade. Excisional debridement of all the necrotic nonviable tissue down to healthy bleeding viable tissue was performed with post-debridement measurements same as pre-. 3. the wound was cleansed and dry sterile dressing applied. 4.  Continue gentamicin cream and a light dressing daily  5.  Patient is to return to clinic in 4 weeks   Edrick Kins, DPM [facility]  Dr. Edrick Kins, DPM    [address], [place] [address]                Office [phone]  Fax [phone]

## 2020-10-29 ENCOUNTER — Other Ambulatory Visit: Payer: Self-pay | Admitting: *Deleted

## 2020-10-29 DIAGNOSIS — E785 Hyperlipidemia, unspecified: Secondary | ICD-10-CM

## 2020-10-29 LAB — COMPREHENSIVE METABOLIC PANEL
ALT: 12 IU/L (ref 0–44)
AST: 18 IU/L (ref 0–40)
Albumin/Globulin Ratio: 1.2 (ref 1.2–2.2)
Albumin: 4.2 g/dL (ref 3.8–4.8)
Alkaline Phosphatase: 109 IU/L (ref 44–121)
BUN/Creatinine Ratio: 16 (ref 10–24)
BUN: 21 mg/dL (ref 8–27)
Bilirubin Total: 0.4 mg/dL (ref 0.0–1.2)
CO2: 23 mmol/L (ref 20–29)
Calcium: 9.1 mg/dL (ref 8.6–10.2)
Chloride: 97 mmol/L (ref 96–106)
Creatinine, Ser: 1.34 mg/dL — ABNORMAL HIGH (ref 0.76–1.27)
Globulin, Total: 3.6 g/dL (ref 1.5–4.5)
Glucose: 103 mg/dL — ABNORMAL HIGH (ref 65–99)
Potassium: 4.7 mmol/L (ref 3.5–5.2)
Sodium: 137 mmol/L (ref 134–144)
Total Protein: 7.8 g/dL (ref 6.0–8.5)
eGFR: 60 mL/min/{1.73_m2} (ref >59)

## 2020-10-29 LAB — LIPID PANEL
Chol/HDL Ratio: 7.1 ratio — ABNORMAL HIGH (ref 0.0–5.0)
Cholesterol, Total: 191 mg/dL (ref 100–199)
HDL: 27 mg/dL — ABNORMAL LOW (ref >39)
LDL Chol Calc (NIH): 136 mg/dL — ABNORMAL HIGH (ref 0–99)
Triglycerides: 155 mg/dL — ABNORMAL HIGH (ref 0–149)
VLDL Cholesterol Cal: 28 mg/dL (ref 5–40)

## 2020-10-29 LAB — CBC WITH DIFFERENTIAL/PLATELET
Basophils Absolute: 0.1 x10E3/uL (ref 0.0–0.2)
Basos: 1 %
EOS (ABSOLUTE): 0.2 x10E3/uL (ref 0.0–0.4)
Eos: 3 %
Hematocrit: 48.9 % (ref 37.5–51.0)
Hemoglobin: 17.2 g/dL (ref 13.0–17.7)
Immature Grans (Abs): 0 x10E3/uL (ref 0.0–0.1)
Immature Granulocytes: 0 %
Lymphocytes Absolute: 2.6 x10E3/uL (ref 0.7–3.1)
Lymphs: 41 %
MCH: 32.5 pg (ref 26.6–33.0)
MCHC: 35.2 g/dL (ref 31.5–35.7)
MCV: 92 fL (ref 79–97)
Monocytes Absolute: 0.7 x10E3/uL (ref 0.1–0.9)
Monocytes: 11 %
Neutrophils Absolute: 2.8 x10E3/uL (ref 1.4–7.0)
Neutrophils: 44 %
Platelets: 234 x10E3/uL (ref 150–450)
RBC: 5.29 x10E6/uL (ref 4.14–5.80)
RDW: 13.1 % (ref 11.6–15.4)
WBC: 6.4 x10E3/uL (ref 3.4–10.8)

## 2020-10-29 LAB — TSH: TSH: 3.11 u[IU]/mL (ref 0.450–4.500)

## 2020-10-29 LAB — PSA: Prostate Specific Ag, Serum: 0.5 ng/mL (ref 0.0–4.0)

## 2020-10-29 LAB — HEMOGLOBIN A1C
Est. average glucose Bld gHb Est-mCnc: 140 mg/dL
Hgb A1c MFr Bld: 6.5 % — ABNORMAL HIGH (ref 4.8–5.6)

## 2020-10-29 MED ORDER — ROSUVASTATIN CALCIUM 40 MG PO TABS: 40.0000 mg | ORAL_TABLET | Freq: Every day | ORAL | 11 refills | Status: DC

## 2020-11-13 ENCOUNTER — Other Ambulatory Visit: Payer: Self-pay | Admitting: Family Medicine

## 2020-11-13 DIAGNOSIS — I10 Essential (primary) hypertension: Secondary | ICD-10-CM

## 2020-11-17 ENCOUNTER — Other Ambulatory Visit: Payer: Self-pay | Admitting: Family Medicine

## 2020-11-17 NOTE — Telephone Encounter (Signed)
Requested Prescriptions  Pending Prescriptions Disp Refills  . hydrochlorothiazide (HYDRODIURIL) 25 MG tablet [Pharmacy Med Name: HYDROCHLOROTHIAZIDE 25 MG TAB] 30 tablet 5    Sig: TAKE 1 TABLET BY MOUTH EVERY DAY. *INSURANCE ONLY COVERS 30 DAYS**     Cardiovascular: Diuretics - Thiazide Failed - 11/17/2020  9:08 AM      Failed - Cr in normal range and within 360 days    Creat  Date Value Ref Range Status  05/05/2017 0.91 0.70 - 1.33 mg/dL Final    Comment:    For patients >33 years of age, the reference limit for Creatinine is approximately 13% higher for people identified as African-American. .    Creatinine, Ser  Date Value Ref Range Status  10/28/2020 1.34 (H) 0.76 - 1.27 mg/dL Final         Passed - Ca in normal range and within 360 days    Calcium  Date Value Ref Range Status  10/28/2020 9.1 8.6 - 10.2 mg/dL Final         Passed - K in normal range and within 360 days    Potassium  Date Value Ref Range Status  10/28/2020 4.7 3.5 - 5.2 mmol/L Final  12/28/2012 4.1 3.5 - 5.1 mmol/L Final         Passed - Na in normal range and within 360 days    Sodium  Date Value Ref Range Status  10/28/2020 137 134 - 144 mmol/L Final         Passed - Last BP in normal range    BP Readings from Last 1 Encounters:  10/22/20 108/68         Passed - Valid encounter within last 6 months    Recent Outpatient Visits          3 weeks ago Annual physical exam   [facility] Jerrol Banana., MD   8 months ago Type 2 diabetes mellitus with complication, without long-term current use of insulin [facility])   [facility] Jerrol Banana., MD   1 year ago DDD (degenerative disc disease), cervical   [facility] Jerrol Banana., MD   1 year ago Cervical strain, acute, initial encounter   [facility] Jerrol Banana., MD   2 years ago Type 2 diabetes mellitus with diabetic polyneuropathy, without long-term  current use of insulin [facility])   [facility] Jerrol Banana., MD      Future Appointments            In 4 months Brendolyn Patty, MD [place]   In 5 months Jerrol Banana., MD [facility]

## 2021-01-22 ENCOUNTER — Encounter: Payer: Self-pay | Admitting: Family Medicine

## 2021-01-22 ENCOUNTER — Other Ambulatory Visit: Payer: Self-pay

## 2021-01-22 ENCOUNTER — Ambulatory Visit: Payer: 59 | Admitting: Family Medicine

## 2021-01-22 VITALS — BP 143/82 | HR 64 | Resp 16 | Ht 68.0 in | Wt 212.0 lb

## 2021-01-22 DIAGNOSIS — M5417 Radiculopathy, lumbosacral region: Secondary | ICD-10-CM | POA: Diagnosis not present

## 2021-01-22 DIAGNOSIS — I10 Essential (primary) hypertension: Secondary | ICD-10-CM | POA: Diagnosis not present

## 2021-01-22 DIAGNOSIS — I70209 Unspecified atherosclerosis of native arteries of extremities, unspecified extremity: Secondary | ICD-10-CM

## 2021-01-22 DIAGNOSIS — E782 Mixed hyperlipidemia: Secondary | ICD-10-CM

## 2021-01-22 DIAGNOSIS — F3341 Major depressive disorder, recurrent, in partial remission: Secondary | ICD-10-CM | POA: Diagnosis not present

## 2021-01-22 DIAGNOSIS — E1142 Type 2 diabetes mellitus with diabetic polyneuropathy: Secondary | ICD-10-CM

## 2021-01-22 DIAGNOSIS — M5030 Other cervical disc degeneration, unspecified cervical region: Secondary | ICD-10-CM

## 2021-01-22 DIAGNOSIS — E291 Testicular hypofunction: Secondary | ICD-10-CM

## 2021-01-22 DIAGNOSIS — I2510 Atherosclerotic heart disease of native coronary artery without angina pectoris: Secondary | ICD-10-CM

## 2021-01-22 DIAGNOSIS — N289 Disorder of kidney and ureter, unspecified: Secondary | ICD-10-CM

## 2021-01-22 NOTE — Progress Notes (Signed)
I,Jeff [name],acting as a scribe for Jeff Durie, MD.,have documented all relevant documentation on the behalf of Jeff Durie, MD,as directed by  Jeff Durie, MD while in the presence of Jeff Durie, MD.   Established patient visit   Patient: Jeff [name]   DOB: May 20, 1961   60 y.o. Male  MRN: [id number] Visit Date: 01/22/2021  Today's healthcare provider: [name], MD   Chief Complaint  Patient presents with   Follow-up   Depression   Subjective    HPI  Patient comes in today for follow-up.  He is followed by dermatology Dr. Nicole [name]. He is thinking about changing to a local pain clinic so that he is closer to treatment. His pain doctor is advised him to see psychiatry.  I told him I thought this will be a good thing to do but he wishes to start with his local pain doctor first.  He is not suicidal or homicidal.  Last PHQ-9 was a 5. Depression, Follow-up  He  was last seen for this 3 months ago. Changes made at last visit include no medication changes.   He reports good compliance with treatment. He is not having side effects. none  He reports good tolerance of treatment. Current symptoms include: depressed mood, fatigue, and insomnia He feels he is Improved since last visit.  Depression screen [facility] 2/9 01/22/2021 10/22/2020 12/07/2017  Decreased Interest 0 0 1  Down, Depressed, Hopeless 1 0 1  PHQ - 2 Score 1 0 2  Altered sleeping 1 3 2   Tired, decreased energy 3 1 3   Change in appetite 0 1 2  Feeling bad or failure about yourself  0 0 0  Trouble concentrating 0 0 1  Moving slowly or fidgety/restless 0 0 1  Suicidal thoughts 0 0 0  PHQ-9 Score 5 5 11   Difficult doing work/chores Not difficult at all Not difficult at all Somewhat difficult    ----------------------------------------------------------------------------       Medications: Outpatient Medications Prior to Visit  Medication Sig   ARIPiprazole (ABILIFY) 5 MG  tablet TAKE 1 TABLET BY MOUTH EVERY DAY   aspirin 81 MG tablet Take 81 mg by mouth daily.   clomiPHENE (CLOMID) 50 MG tablet Take 0.5 tablets (25 mg total) by mouth daily.   gentamicin cream (GARAMYCIN) 0.1 % Apply 1 application topically 2 (two) times daily.   hydrochlorothiazide (HYDRODIURIL) 25 MG tablet TAKE 1 TABLET BY MOUTH EVERY DAY. *INSURANCE ONLY COVERS 30 DAYS**   hydrocortisone 2.5 % cream Apply topically.   ibuprofen (ADVIL) 400 MG tablet Take 400 mg by mouth 3 (three) times daily as needed.   JARDIANCE 25 MG TABS tablet TAKE 1 TABLET BY MOUTH EVERY DAY   losartan (COZAAR) 100 MG tablet TAKE 1 TABLET BY MOUTH EVERY DAY   LYRICA 100 MG capsule LIMIT 1 CAPSULE BY MOUTH 3 - 5 TIMES PER DAY IF TOLERATED (NOTE THAT CAPSULE IS NOW 100 MG SIZE)   metFORMIN (GLUCOPHAGE) 1000 MG tablet TAKE 1 TABLET BY MOUTH TWICE A DAY WITH MEALS   metoprolol succinate (TOPROL-XL) 50 MG 24 hr tablet TAKE 1 TABLET BY MOUTH EVERY DAY   mometasone (ELOCON) 0.1 % cream Apply 1 application topically daily.   ONETOUCH VERIO test strip CHECK SUGAR ONCE DAILY DX E11.9   orphenadrine (NORFLEX) 100 MG tablet Take 100 mg by mouth 2 (two) times daily.   oxyCODONE (ROXICODONE) 15 MG immediate release tablet SMARTSIG:1 Tablet(s) By Mouth 4-6  Times Daily   rosuvastatin (CRESTOR) 40 MG tablet Take 1 tablet (40 mg total) by mouth daily.   sildenafil (REVATIO) 20 MG tablet TAKE 1 TO 5 TABLETS BY MOUTH DAILY AS NEEDED   venlafaxine (EFFEXOR) 75 MG tablet TAKE 3 TABLETS (225 MG TOTAL) BY MOUTH DAILY.   baclofen (LIORESAL) 10 MG tablet TAKE 1 TABLET BY MOUTH 3 TIMES A DAY AS NEEDED FOR SPASMS (Patient not taking: No sig reported)   doxycycline (VIBRA-TABS) 100 MG tablet Take 1 tablet (100 mg total) by mouth 2 (two) times daily. (Patient not taking: No sig reported)   [DISCONTINUED] HYDROcodone-acetaminophen (NORCO/VICODIN) 5-325 MG tablet Take 1 tablet by mouth every 6 (six) hours as needed for moderate pain.   [DISCONTINUED]  Oxycodone HCl 10 MG TABS LIMIT 1 TABLET BY MOUTH 4   6 TIMES PER DAY IF TOLERATED,   [DISCONTINUED] oxyCODONE-acetaminophen (PERCOCET) 5-325 MG tablet Take 1 tablet by mouth every 6 (six) hours as needed for severe pain.   No facility-administered medications prior to visit.    Review of Systems  Constitutional:  Negative for activity change and fatigue.  Respiratory:  Negative for cough and shortness of breath.   Cardiovascular:  Negative for chest pain, palpitations and leg swelling.  Musculoskeletal:  Negative for arthralgias and gait problem.  Neurological:  Negative for dizziness, light-headedness and headaches.  Psychiatric/Behavioral:  Negative for agitation, self-injury, sleep disturbance and suicidal ideas. The patient is not nervous/anxious.        Objective    BP (!) 143/82 (BP Location: Right Arm, Patient Position: Sitting, Cuff Size: Large)   Pulse 64   Resp 16   Ht 5\' 8"  (1.727 m)   Wt 212 lb (96.2 kg)   SpO2 96%   BMI 32.23 kg/m  BP Readings from Last 3 Encounters:  01/22/21 (!) 143/82  10/22/20 108/68  03/21/20 (!) 147/80   Wt Readings from Last 3 Encounters:  01/22/21 212 lb (96.2 kg)  10/22/20 213 lb (96.6 kg)  03/21/20 213 lb (96.6 kg)       Physical Exam Vitals reviewed.  Constitutional:      Appearance: He is well-developed.  HENT:     Head: Normocephalic and atraumatic.     Right Ear: External ear normal.     Left Ear: External ear normal.     Nose: Nose normal.  Eyes:     General: No scleral icterus.    Conjunctiva/sclera: Conjunctivae normal.  Neck:     Thyroid: No thyromegaly.  Cardiovascular:     Rate and Rhythm: Normal rate and regular rhythm.     Heart sounds: Normal heart sounds.  Pulmonary:     Effort: Pulmonary effort is normal.     Breath sounds: Normal breath sounds.  Abdominal:     Palpations: Abdomen is soft.  Musculoskeletal:     Right lower leg: Edema present.     Left lower leg: Edema present.     Comments: 1+ edema  with support hose.  Skin:    General: Skin is warm and dry.     Comments: He is missing most of the toenails of his feet.  Neurological:     Mental Status: He is alert and oriented to person, place, and time.     Comments: Very fair skin with significant solar keratosis especially the forearms and hands  Psychiatric:        Mood and Affect: Mood normal.        Behavior: Behavior normal.  Thought Content: Thought content normal.        Judgment: Judgment normal.      No results found for any visits on 01/22/21.  Assessment & Plan     1. Recurrent major depressive disorder, in partial remission ([place]) Patient on Abilify and venlafaxine maximum dose.  Consider referring to psychiatry.  I definitely think patient would benefit from counseling  2. L-S radiculopathy Refer to local pain clinic.  Patient is on oxycodone and baclofen and pregabalin - Ambulatory referral to Pain Clinic  3. DDD (degenerative disc disease), cervical - Ambulatory referral to Pain Clinic  4. Essential (primary) hypertension HCTZ and losartan and metoprolol  5. Atherosclerotic peripheral vascular disease (HCC) On rosuvastatin 40 maximum dose  6. 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 Type 2 diabetes mellitus with diabetic polyneuropathy, without long-term current use of insulin (HCC) Metformin and Jardiance  9. Mixed hyperlipidemia On rosuvastatin  10. Abnormal kidney function Follow routine labs   Return in about 2 months (around 03/24/2021).      I, Jeff Durie, MD, have reviewed all documentation for this visit. The documentation on 01/27/21 for the exam, diagnosis, procedures, and orders are all accurate and complete.    [name] Mon, MD  [facility] [phone] (phone) [phone] (fax)  [place]

## 2021-01-24 ENCOUNTER — Ambulatory Visit: Payer: 59 | Admitting: Podiatry

## 2021-01-28 ENCOUNTER — Ambulatory Visit: Payer: 59 | Admitting: Podiatry

## 2021-01-28 ENCOUNTER — Other Ambulatory Visit: Payer: Self-pay

## 2021-01-28 DIAGNOSIS — L97512 Non-pressure chronic ulcer of other part of right foot with fat layer exposed: Secondary | ICD-10-CM | POA: Diagnosis not present

## 2021-01-28 DIAGNOSIS — E0843 Diabetes mellitus due to underlying condition with diabetic autonomic (poly)neuropathy: Secondary | ICD-10-CM

## 2021-01-28 NOTE — Progress Notes (Signed)
Subjective:  60 y.o. male with PMHx of diabetes mellitus presenting for routine follow-up foot care.  Patient states that he developed callus to the medial aspect of the right great toe that he tried to trim himself.  He is afraid that he cut it too deep and now he has a wound.  He also complains that his toenails have not been growing back.  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  Very strenuous on his feet in composite toed boots.  He states that at the end of the day he notices bleeding to the toenails all digits.  He presents for further treatment and evaluation   Past Medical History:  Diagnosis Date   Cancer ([place])    SKIN    Depression    Diabetes mellitus without complication ([place])    Type II   Dyspnea    with exertion    Fatigue    Hyperlipidemia    Hypertension    Hypogonadism male    Neuromuscular disorder ([place])    "back nerve stimulator"   Neuropathy    PAD (peripheral artery disease) ([place])    Sleep apnea    CPAP   Tinnitus    Tuberculosis    POSITIVE  TB SKIN TEST 1992.6 MTH [place] .was exposed to someone who had it.      Objective/Physical Exam General: The patient is alert and oriented x3 in no acute distress.  Dermatology:  Wound #1 noted to the medial aspect of the first MTPJ right foot measuring approximately 1.5 x 1.0 x 0.2 cm (LxWxD).   To the noted ulceration(s), there is no eschar. There is a moderate amount of slough, fibrin, and necrotic tissue noted. Granulation tissue and wound base is red. There is a minimal amount of serosanguineous drainage noted. There is no exposed bone muscle-tendon ligament or joint. There is no malodor. Periwound integrity is intact. Skin is warm, dry and supple bilateral lower extremities.  Vascular: Palpable pedal pulses bilaterally. No edema or erythema noted. Capillary refill within normal limits.  Neurological: Epicritic and protective threshold diminished bilaterally.   Musculoskeletal Exam:  Range of motion within normal limits to all pedal and ankle joints bilateral. Muscle strength 5/5 in all groups bilateral.   Assessment: 1.  Ulcer right first MTPJ secondary to diabetes mellitus 2. diabetes mellitus w/ peripheral neuropathy   Plan of Care:  1. Patient was evaluated. 2. medically necessary excisional debridement including subcutaneous tissue was performed using a tissue nipper and a chisel blade. Excisional debridement of all the necrotic nonviable tissue down to healthy bleeding viable tissue was performed with post-debridement measurements same as pre-. 3. the wound was cleansed and dry sterile dressing applied. 4.  Recommend that the patient goes to the shoe market  on [address] for good supportive shoes and diabetic insoles.  Patient's insurance denied diabetic shoes and insoles  5.  Patient is to return to clinic in 2 months   Edrick Kins, DPM [facility]  Dr. Edrick Kins, DPM    [address], [place] [address]                Office [phone]  Fax [phone]

## 2021-03-18 ENCOUNTER — Encounter: Payer: Self-pay | Admitting: Dermatology

## 2021-03-18 ENCOUNTER — Other Ambulatory Visit: Payer: Self-pay

## 2021-03-18 ENCOUNTER — Ambulatory Visit: Payer: 59 | Admitting: Dermatology

## 2021-03-18 DIAGNOSIS — Z85828 Personal history of other malignant neoplasm of skin: Secondary | ICD-10-CM

## 2021-03-18 DIAGNOSIS — L570 Actinic keratosis: Secondary | ICD-10-CM

## 2021-03-18 DIAGNOSIS — Q825 Congenital non-neoplastic nevus: Secondary | ICD-10-CM

## 2021-03-18 DIAGNOSIS — Z872 Personal history of diseases of the skin and subcutaneous tissue: Secondary | ICD-10-CM

## 2021-03-18 DIAGNOSIS — D692 Other nonthrombocytopenic purpura: Secondary | ICD-10-CM

## 2021-03-18 DIAGNOSIS — Z1283 Encounter for screening for malignant neoplasm of skin: Secondary | ICD-10-CM | POA: Diagnosis not present

## 2021-03-18 DIAGNOSIS — L578 Other skin changes due to chronic exposure to nonionizing radiation: Secondary | ICD-10-CM | POA: Diagnosis not present

## 2021-03-18 DIAGNOSIS — Z86018 Personal history of other benign neoplasm: Secondary | ICD-10-CM

## 2021-03-18 NOTE — Progress Notes (Signed)
New Patient Visit  Subjective  Jeff [name] is a 60 y.o. male who presents for the following: Growths (Fingers, hands. Painful when hit.). Patient has a history of Aks, BCC of the back, and Dysplastic nevus of the face. UBSE today.   Patient referred from Dr Rosanna Randy.  The following portions of the chart were reviewed this encounter and updated as appropriate:       Review of Systems:  No other skin or systemic complaints except as noted in HPI or Assessment and Plan.  Objective  Well appearing patient in no apparent distress; mood and affect are within normal limits.  All skin waist up examined.  R 5th Finger x 1, R index finger x 1, R wrist x 2, R forearm x 1, L 3rd finger x 1, L thenar hand x 1, L wrist x 2, L 2nd webspace x 1, L index finger x 2, R upper back x 1, R nasal dorsum x 1, R upper eyebrow x 1, R nasal tip x 2 (17) Keratotic papules.  Left Upper Abdomen 4.0 x 2.0 cm speckled brown patch with hypertrichosis   Assessment & Plan  Skin cancer screening performed today.  Actinic Damage - chronic, secondary to cumulative UV radiation exposure/sun exposure over time - diffuse scaly erythematous macules with underlying dyspigmentation - Recommend daily broad spectrum sunscreen SPF 30+ to sun-exposed areas, reapply every 2 hours as needed.  - Recommend staying in the shade or wearing long sleeves, sun glasses (UVA+UVB protection) and wide brim hats (4-inch brim around the entire circumference of the hat). - Call for new or changing lesions.  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 If not clear, may biopsy.  Actinic keratoses are precancerous spots that appear secondary to cumulative UV  radiation exposure/sun exposure over time. They are chronic with expected duration over 1 year. A portion of actinic keratoses will progress to squamous cell carcinoma of the skin. It is not possible to reliably predict which spots will progress to skin cancer and so treatment is recommended to prevent development of skin cancer.  Recommend daily broad spectrum sunscreen SPF 30+ to sun-exposed areas, reapply every 2 hours as needed.  Recommend staying in the shade or wearing long sleeves, sun glasses (UVA+UVB protection) and wide brim hats (4-inch brim around the entire circumference of the hat). Call for new or changing lesions.  Destruction of lesion - R 5th Finger x 1, R index finger x 1, R wrist x 2, R forearm x 1, L 3rd finger x 1, L thenar hand x 1, L wrist x 2, L 2nd webspace x 1, L index finger x 2, R upper back x 1, R nasal dorsum x 1, R upper eyebrow x 1, R nasal tip x 2  Destruction method: cryotherapy   Informed consent: discussed and consent obtained   Lesion destroyed using liquid nitrogen: Yes   Region frozen until ice ball extended beyond lesion: Yes   Outcome: patient tolerated procedure well with no complications   Post-procedure details: wound care instructions given   Additional details:  Prior to procedure,  discussed risks of blister formation, small wound, skin dyspigmentation, or rare scar following cryotherapy. Recommend Vaseline ointment to treated areas while healing.   Congenital non-neoplastic nevus Left Upper Abdomen  Benign-appearing.  Observation.  Call clinic for new or changing moles.  Recommend daily use of broad spectrum spf 30+ sunscreen to sun-exposed areas.   History of Basal Cell Carcinoma of the Skin - No evidence of recurrence today - Recommend regular full body skin exams - Recommend daily broad spectrum sunscreen SPF 30+ to sun-exposed areas, reapply every 2 hours as needed.  - Call if any new or changing lesions are noted between office  visits  History of Dysplastic Nevus - No evidence of recurrence today of the right malar cheek - Recommend regular full body skin exams - Recommend daily broad spectrum sunscreen SPF 30+ to sun-exposed areas, reapply every 2 hours as needed.  - Call if any new or changing lesions are noted between office visits  Purpura - Chronic; persistent and recurrent.  Treatable, but not curable. - Violaceous macules and patches of the left flexor forearm - Benign - Related to trauma, age, sun damage and/or use of blood thinners, chronic use of topical and/or oral steroids - Observe - Can use OTC arnica containing moisturizer such as Dermend Bruise Formula if desired - Call for worsening or other concerns  Return in about 3 months (around 06/18/2021) for AKs, recheck right upper eyebrow and right 5th finger.  IJamesetta Orleans, CMA, am acting as scribe for Brendolyn Patty, MD .  Documentation: I have reviewed the above documentation for accuracy and completeness, and I agree with the above.  Brendolyn Patty MD

## 2021-03-18 NOTE — Patient Instructions (Signed)

## 2021-03-24 ENCOUNTER — Encounter: Payer: Self-pay | Admitting: Family Medicine

## 2021-03-24 ENCOUNTER — Other Ambulatory Visit: Payer: Self-pay

## 2021-03-24 ENCOUNTER — Ambulatory Visit: Payer: 59 | Admitting: Family Medicine

## 2021-03-24 VITALS — BP 136/85 | HR 64 | Temp 97.9°F | Resp 18 | Wt 207.0 lb

## 2021-03-24 DIAGNOSIS — I10 Essential (primary) hypertension: Secondary | ICD-10-CM

## 2021-03-24 DIAGNOSIS — E785 Hyperlipidemia, unspecified: Secondary | ICD-10-CM | POA: Diagnosis not present

## 2021-03-24 DIAGNOSIS — E782 Mixed hyperlipidemia: Secondary | ICD-10-CM

## 2021-03-24 DIAGNOSIS — R5383 Other fatigue: Secondary | ICD-10-CM

## 2021-03-24 DIAGNOSIS — E1142 Type 2 diabetes mellitus with diabetic polyneuropathy: Secondary | ICD-10-CM

## 2021-03-24 NOTE — Progress Notes (Signed)
I,Roshena L Chambers,acting as a scribe for [name], MD.,have documented all relevant documentation on the behalf of [name], MD,as directed by  [name], MD while in the presence of [name], MD.    Established patient visit   Patient: Jeff [name]   DOB: 1960-11-20   60 y.o. Male  MRN: [id number] Visit Date: 03/24/2021  Today's healthcare provider: [name], MD   Chief Complaint  Patient presents with   Diabetes   Hypertension   Hyperlipidemia   Depression   Subjective  -------------------------------------------------------------------------------------------------------------------- HPI  Patient complains of fatigue for the last couple of days but he has been out of his pain medications for the last couple of days until tomorrow morning.  He thinks the fatigue is due to not having a pain medication.  No other unusual symptoms. Diabetes Mellitus Type II, follow-up  Lab Results  Component Value Date   HGBA1C 6.5 (H) 10/28/2020   HGBA1C 5.9 (H) 03/21/2020   HGBA1C 6.6 (H) 08/18/2018   Last seen for diabetes 2 months ago.  Management since then includes continuing the same treatment. He reports good compliance with treatment. He is not having side effects.   Home blood sugar records:  patient does not remember what his average blood sugars are at home  Episodes of hypoglycemia? Yes -has occurred once since the last visit when he skipped a meal.   Current insulin regiment: none Most Recent Eye Exam: not UTD  --------------------------------------------------------------------------------------------------- Hypertension, follow-up  BP Readings from Last 3 Encounters:  03/24/21 136/85  01/22/21 (!) 143/82  10/22/20 108/68   Wt Readings from Last 3 Encounters:  03/24/21 207 lb (93.9 kg)  01/22/21 212 lb (96.2 kg)  10/22/20 213 lb (96.6 kg)     He was last seen for hypertension 2 months ago.  BP at that  visit was 143/82. Management since that visit includes no medication changes. Continue to monitor BP at home.  He reports good compliance with treatment. He is not having side effects.  He is not exercising. He is not adherent to low salt diet.   Outside blood pressures are no checked.  He does not smoke.  Use of agents associated with hypertension: NSAIDS.   --------------------------------------------------------------------------------------------------- Lipid/Cholesterol, follow-up  Last Lipid Panel: Lab Results  Component Value Date   CHOL 191 10/28/2020   LDLCALC 136 (H) 10/28/2020   HDL 27 (L) 10/28/2020   TRIG 155 (H) 10/28/2020    He was last seen for this 2 months ago.  Management since that visit includes no medication changes.  He reports good compliance with treatment. He is not having side effects.   Symptoms: No appetite changes No foot ulcerations  No chest pain No chest pressure/discomfort  No dyspnea No orthopnea  No fatigue No lower extremity edema  No palpitations No paroxysmal nocturnal dyspnea  No nausea No numbness or tingling of extremity  No polydipsia No polyuria  No speech difficulty No syncope   He is following a Regular diet. Current exercise: none  Last metabolic panel Lab Results  Component Value Date   GLUCOSE 103 (H) 10/28/2020   NA 137 10/28/2020   K 4.7 10/28/2020   BUN 21 10/28/2020   CREATININE 1.34 (H) 10/28/2020   GFRNONAA 62 03/21/2020   GFRAA 71 03/21/2020   CALCIUM 9.1 10/28/2020   AST 18 10/28/2020   ALT 12 10/28/2020   The 10-year ASCVD risk score Mikey Bussing DC Jr., et al., 2013)  is: 32.7%  --------------------------------------------------------------------------------------------------- Depression, Follow-up  He  was last seen for this 2 months ago. Changes made at last visit include no medication changes. Consider referring to counseling if symptoms worsen.    He reports good compliance with treatment. He is  not having side effects.   He reports good tolerance of treatment. Current symptoms include: depressed mood He feels he is Unchanged since last visit.  Depression screen [facility] 2/9 03/24/2021 01/22/2021 10/22/2020  Decreased Interest 1 0 0  Down, Depressed, Hopeless 1 1 0  PHQ - 2 Score 2 1 0  Altered sleeping '1 1 3  '$ Tired, decreased energy '2 3 1  '$ Change in appetite 2 0 1  Feeling bad or failure about yourself  1 0 0  Trouble concentrating 2 0 0  Moving slowly or fidgety/restless 1 0 0  Suicidal thoughts 0 0 0  PHQ-9 Score '11 5 5  '$ Difficult doing work/chores Somewhat difficult Not difficult at all Not difficult at all    -----------------------------------------------------------------------------------------       Medications: Outpatient Medications Prior to Visit  Medication Sig   ARIPiprazole (ABILIFY) 5 MG tablet TAKE 1 TABLET BY MOUTH EVERY DAY   aspirin 81 MG tablet Take 81 mg by mouth daily.   baclofen (LIORESAL) 10 MG tablet TAKE 1 TABLET BY MOUTH 3 TIMES A DAY AS NEEDED FOR SPASMS   clomiPHENE (CLOMID) 50 MG tablet Take 0.5 tablets (25 mg total) by mouth daily.   gentamicin cream (GARAMYCIN) 0.1 % Apply 1 application topically 2 (two) times daily.   hydrochlorothiazide (HYDRODIURIL) 25 MG tablet TAKE 1 TABLET BY MOUTH EVERY DAY. *INSURANCE ONLY COVERS 30 DAYS**   hydrocortisone 2.5 % cream Apply topically.   ibuprofen (ADVIL) 400 MG tablet Take 400 mg by mouth 3 (three) times daily as needed.   JARDIANCE 25 MG TABS tablet TAKE 1 TABLET BY MOUTH EVERY DAY   losartan (COZAAR) 100 MG tablet TAKE 1 TABLET BY MOUTH EVERY DAY   LYRICA 100 MG capsule LIMIT 1 CAPSULE BY MOUTH 3 - 5 TIMES PER DAY IF TOLERATED (NOTE THAT CAPSULE IS NOW 100 MG SIZE)   metFORMIN (GLUCOPHAGE) 1000 MG tablet TAKE 1 TABLET BY MOUTH TWICE A DAY WITH MEALS   metoprolol succinate (TOPROL-XL) 50 MG 24 hr tablet TAKE 1 TABLET BY MOUTH EVERY DAY   mometasone (ELOCON) 0.1 % cream Apply 1 application topically  daily.   ONETOUCH VERIO test strip CHECK SUGAR ONCE DAILY DX E11.9   oxyCODONE (ROXICODONE) 15 MG immediate release tablet SMARTSIG:1 Tablet(s) By Mouth 4-6 Times Daily   rosuvastatin (CRESTOR) 40 MG tablet Take 1 tablet (40 mg total) by mouth daily.   sildenafil (REVATIO) 20 MG tablet TAKE 1 TO 5 TABLETS BY MOUTH DAILY AS NEEDED   venlafaxine (EFFEXOR) 75 MG tablet TAKE 3 TABLETS (225 MG TOTAL) BY MOUTH DAILY.   [DISCONTINUED] orphenadrine (NORFLEX) 100 MG tablet Take 100 mg by mouth 2 (two) times daily. (Patient not taking: Reported on 03/24/2021)   No facility-administered medications prior to visit.    Review of Systems  Constitutional:  Negative for appetite change, chills and fever.  Respiratory:  Negative for chest tightness, shortness of breath and wheezing.   Cardiovascular:  Negative for chest pain and palpitations.  Gastrointestinal:  Negative for abdominal pain, nausea and vomiting.   Last hemoglobin A1c Lab Results  Component Value Date   HGBA1C 7.4 (H) 03/25/2021       Objective  -------------------------------------------------------------------------------------------------------------------- BP 136/85 (BP Location: Left  Arm, Patient Position: Sitting, Cuff Size: Normal)   Pulse 64   Temp 97.9 F (36.6 C) (Temporal)   Resp 18   Wt 207 lb (93.9 kg)   BMI 31.47 kg/m  BP Readings from Last 3 Encounters:  03/27/21 130/67  03/24/21 136/85  01/22/21 (!) 143/82   Wt Readings from Last 3 Encounters:  03/27/21 208 lb (94.3 kg)  03/24/21 207 lb (93.9 kg)  01/22/21 212 lb (96.2 kg)       Physical Exam Vitals reviewed.  Constitutional:      Appearance: He is well-developed.  HENT:     Head: Normocephalic and atraumatic.     Right Ear: External ear normal.     Left Ear: External ear normal.     Nose: Nose normal.  Eyes:     General: No scleral icterus.    Conjunctiva/sclera: Conjunctivae normal.  Neck:     Thyroid: No thyromegaly.  Cardiovascular:      Rate and Rhythm: Normal rate and regular rhythm.     Heart sounds: Normal heart sounds.  Pulmonary:     Effort: Pulmonary effort is normal.     Breath sounds: Normal breath sounds.  Abdominal:     Palpations: Abdomen is soft.  Musculoskeletal:     Right lower leg: Edema present.     Left lower leg: Edema present.     Comments: 1+ edema with support hose.  Skin:    General: Skin is warm and dry.  Neurological:     Mental Status: He is alert and oriented to person, place, and time.  Psychiatric:        Mood and Affect: Mood normal.        Behavior: Behavior normal.        Thought Content: Thought content normal.        Judgment: Judgment normal.      No results found for any visits on 03/24/21.  Assessment & Plan  ---------------------------------------------------------------------------------------------------------------------- 1. Type 2 diabetes mellitus with diabetic polyneuropathy, without long-term current use of insulin (HCC) Goal A1c less than 7. - Hemoglobin A1c  2. Mixed hyperlipidemia On rosuvastatin 40 - Lipid panel  3. Hyperlipidemia, unspecified hyperlipidemia type   4. Essential (primary) hypertension  - Comprehensive metabolic panel  5. Other fatigue With chronic pain syndrome.  He thinks the fatigue is down to not having his pain medications for the past day or so.  He is feeling of wellbeing when he is on pain medication. - CBC with Differential/Platelet - TSH - Testosterone - Prolactin   Return in about 4 months (around 07/24/2021).      I, [name], MD, have reviewed all documentation for this visit. The documentation on 03/30/21 for the exam, diagnosis, procedures, and orders are all accurate and complete.    Hartford Maulden Cranford Mon, MD  [facility] [phone] (phone) [phone] (fax)  Tea

## 2021-03-26 LAB — CBC WITH DIFFERENTIAL/PLATELET
Basophils Absolute: 0.1 10*3/uL (ref 0.0–0.2)
Basos: 1 %
EOS (ABSOLUTE): 0.1 10*3/uL (ref 0.0–0.4)
Eos: 1 %
Hematocrit: 53.1 % — ABNORMAL HIGH (ref 37.5–51.0)
Hemoglobin: 18.5 g/dL — ABNORMAL HIGH (ref 13.0–17.7)
Immature Grans (Abs): 0 10*3/uL (ref 0.0–0.1)
Immature Granulocytes: 0 %
Lymphocytes Absolute: 2.4 10*3/uL (ref 0.7–3.1)
Lymphs: 33 %
MCH: 31.8 pg (ref 26.6–33.0)
MCHC: 34.8 g/dL (ref 31.5–35.7)
MCV: 91 fL (ref 79–97)
Monocytes Absolute: 0.7 10*3/uL (ref 0.1–0.9)
Monocytes: 9 %
Neutrophils Absolute: 4.1 10*3/uL (ref 1.4–7.0)
Neutrophils: 56 %
Platelets: 228 10*3/uL (ref 150–450)
RBC: 5.82 x10E6/uL — ABNORMAL HIGH (ref 4.14–5.80)
RDW: 12.3 % (ref 11.6–15.4)
WBC: 7.3 10*3/uL (ref 3.4–10.8)

## 2021-03-26 LAB — COMPREHENSIVE METABOLIC PANEL
ALT: 15 IU/L (ref 0–44)
AST: 15 IU/L (ref 0–40)
Albumin/Globulin Ratio: 1.4 (ref 1.2–2.2)
Albumin: 4.8 g/dL (ref 3.8–4.8)
Alkaline Phosphatase: 110 IU/L (ref 44–121)
BUN/Creatinine Ratio: 16 (ref 10–24)
BUN: 22 mg/dL (ref 8–27)
Bilirubin Total: 0.9 mg/dL (ref 0.0–1.2)
CO2: 26 mmol/L (ref 20–29)
Calcium: 9.6 mg/dL (ref 8.6–10.2)
Chloride: 96 mmol/L (ref 96–106)
Creatinine, Ser: 1.4 mg/dL — ABNORMAL HIGH (ref 0.76–1.27)
Globulin, Total: 3.4 g/dL (ref 1.5–4.5)
Glucose: 173 mg/dL — ABNORMAL HIGH (ref 65–99)
Potassium: 4.5 mmol/L (ref 3.5–5.2)
Sodium: 137 mmol/L (ref 134–144)
Total Protein: 8.2 g/dL (ref 6.0–8.5)
eGFR: 57 mL/min/{1.73_m2} — ABNORMAL LOW (ref >59)

## 2021-03-26 LAB — TESTOSTERONE: Testosterone: 74 ng/dL — ABNORMAL LOW (ref 264–916)

## 2021-03-26 LAB — LIPID PANEL
Chol/HDL Ratio: 4.5 ratio (ref 0.0–5.0)
Cholesterol, Total: 130 mg/dL (ref 100–199)
HDL: 29 mg/dL — ABNORMAL LOW (ref >39)
LDL Chol Calc (NIH): 75 mg/dL (ref 0–99)
Triglycerides: 148 mg/dL (ref 0–149)
VLDL Cholesterol Cal: 26 mg/dL (ref 5–40)

## 2021-03-26 LAB — HEMOGLOBIN A1C
Est. average glucose Bld gHb Est-mCnc: 166 mg/dL
Hgb A1c MFr Bld: 7.4 % — ABNORMAL HIGH (ref 4.8–5.6)

## 2021-03-26 LAB — PROLACTIN: Prolactin: 7.3 ng/mL (ref 4.0–15.2)

## 2021-03-26 LAB — TSH: TSH: 1.41 u[IU]/mL (ref 0.450–4.500)

## 2021-03-27 ENCOUNTER — Ambulatory Visit
Payer: 59 | Attending: Student in an Organized Health Care Education/Training Program | Admitting: Student in an Organized Health Care Education/Training Program

## 2021-03-27 ENCOUNTER — Other Ambulatory Visit: Payer: Self-pay

## 2021-03-27 ENCOUNTER — Encounter: Payer: Self-pay | Admitting: Student in an Organized Health Care Education/Training Program

## 2021-03-27 VITALS — BP 130/67 | HR 53 | Temp 96.8°F | Resp 16 | Ht 68.0 in | Wt 208.0 lb

## 2021-03-27 DIAGNOSIS — Z9689 Presence of other specified functional implants: Secondary | ICD-10-CM | POA: Diagnosis not present

## 2021-03-27 DIAGNOSIS — T402X5A Adverse effect of other opioids, initial encounter: Secondary | ICD-10-CM | POA: Diagnosis present

## 2021-03-27 DIAGNOSIS — Z981 Arthrodesis status: Secondary | ICD-10-CM | POA: Insufficient documentation

## 2021-03-27 DIAGNOSIS — G894 Chronic pain syndrome: Secondary | ICD-10-CM | POA: Diagnosis not present

## 2021-03-27 DIAGNOSIS — Z9889 Other specified postprocedural states: Secondary | ICD-10-CM | POA: Diagnosis present

## 2021-03-27 DIAGNOSIS — F331 Major depressive disorder, recurrent, moderate: Secondary | ICD-10-CM | POA: Insufficient documentation

## 2021-03-27 DIAGNOSIS — F1129 Opioid dependence with unspecified opioid-induced disorder: Secondary | ICD-10-CM | POA: Diagnosis not present

## 2021-03-27 DIAGNOSIS — R208 Other disturbances of skin sensation: Secondary | ICD-10-CM | POA: Diagnosis not present

## 2021-03-27 NOTE — Progress Notes (Signed)
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 calcified disc protrusion on the left at T11-12. Adequate posterior decompression at this level. No significant spinal stenosis  Visualized lung parenchyma is negative for acute abnormality.  CT LUMBAR SPINE FINDINGS  Normal lumbar alignment. Negative for fracture or mass. No acute bony abnormality. SI joints are normal. Adjacent soft tissues are without acute abnormality. Spinal cord stimulator generator pack is noted in the subcutaneous tissues of the left lumbar spine. Mild atherosclerotic calcification in the abdominal aorta without aneurysm.  L1-2:  Mild disc and facet degeneration without spinal stenosis  L2-3: Diffuse disc bulging. Bilateral facet hypertrophy. Mild spinal stenosis.  L3-4: Diffuse disc bulging. Bilateral facet hypertrophy. Mild spinal stenosis.  L4-5:  Diffuse bulging of the disc. Bilateral facet and ligamentum flavum hypertrophy. Mild spinal stenosis. Neural foramina adequately patent  L5-S1: Mild disc degeneration. Moderate facet hypertrophy without significant spinal or foraminal stenosis.  IMPRESSION: Surgical fusion at C6-7. Multilevel cervical degenerative change without significant spinal stenosis  Spinal cord stimulator at T7-8 in good position. Bilateral laminectomy T10 and T11. Calcified chronic disc protrusion on the left at T11-T12. Thoracic facet degeneration as above  Lumbar degenerative changes as above. Mild spinal stenosis at L2-3, L3-4, and L4-5 due to disc and facet degeneration  No acute spinal abnormality.   Electronically Signed By: Franchot Gallo M.D. On: 05/17/2015 16:13    Narrative *RADIOLOGY REPORT*  Clinical Data: T10-12 laminectomy and microdiskectomy.  THORACOLUMBAR SPINE - 2 VIEW  Comparison: Thoracic spine MR 03/23/2011.  Findings: Two intraoperative cross-table lateral views of the thoracolumbar spine are submitted.  The first film, taken at 0955 hours, shows a surgical instrument tip projecting anterior to the T11 vertebral body.  Multilevel endplate degenerative changes are seen in the spine.  The second film, taken at 1005 hours, shows a surgical instrument tip projecting in the  soft tissues posterior to the T11-12 disc space.  IMPRESSION: Intraoperative visualization for T10-12 laminectomy and microdiskectomy.  Original Report Authenticated By: Luretha Rued, M.D.   Narrative *RADIOLOGY REPORT*  Clinical Data: Bilateral lower extremity pain and low back pain. Right leg tenderness, progressive.  MRI LUMBAR SPINE WITHOUT CONTRAST  Technique:  Multiplanar and multiecho pulse sequences of the lumbar spine were obtained without intravenous contrast.  Comparison: Lumbar MRI dated 05/23/2011  Findings: The scan extends from T11-12 through S2. Tip of the conus is at  T12 and appears normal.  There has been no change in the appearance of the lumbar spine since the prior exam.  No abnormality from T12-L1 through L4-5. Small annular tear and central disc bulge at L5-S1 without impingement.  Paraspinal soft tissues are normal.  The operative site at the T10-11 and T11-12 is described on the MRI of the thoracic spine performed on this same day.  IMPRESSION: Stable appearance of the lumbar spine with no significant abnormalities.  No findings to explain this patient's right leg symptoms.  Original Report Authenticated By: Larey Seat, M.D.  Narrative CLINICAL DATA:  Bilateral low back pain with sciatica unspecified. Neck pain. Multiple falls. Cervical fusion 2012. Spinal cord stimulator.  EXAM: CT CERVICAL, THORACIC, AND LUMBAR SPINE WITHOUT CONTRAST  TECHNIQUE: Multidetector CT imaging of the cervical, thoracic and lumbar spine was performed without intravenous contrast. Multiplanar CT image reconstructions were also generated.  COMPARISON:  MRI lumbar spine 07/03/2015.  FINDINGS: CT CERVICAL SPINE FINDINGS  ACDF at C6-7. Anterior plate and screws in good position. Solid fusion. Normal alignment. Negative for fracture. Negative for bony or soft tissue mass.  C2-3:  Negative  C3-4: Disc degeneration with mild uncinate spurring. No significant stenosis  C4-5: Mild disc degeneration and mild uncinate spurring. Bilateral facet hypertrophy. No significant spinal or foraminal stenosis.  C5-6: Mild disc degeneration and mild uncinate spurring without stenosis  C6-7 surgical fusion which appears solid. No significant spinal or foraminal stenosis  C7-T1: Negative  CT THORACIC SPINE FINDINGS  Thoracic alignment is normal. Negative for fracture or mass. Bilateral laminectomy at T10 and T11. Spinal cord stimulator extends to the T7-8 level posteriorly in good position.  No significant spinal stenosis. Mild degenerative changes.  Mild facet degeneration is present bilaterally at T2-3 and T3-4. Disc degeneration and calcified disc protrusion on the left at T11-12. Adequate posterior decompression at this level. No significant spinal stenosis  Visualized lung parenchyma is negative for acute abnormality.  CT LUMBAR SPINE FINDINGS  Normal lumbar alignment. Negative for fracture or mass. No acute bony abnormality. SI joints are normal. Adjacent soft tissues are without acute abnormality. Spinal cord stimulator generator pack is noted in the subcutaneous tissues of the left lumbar spine. Mild atherosclerotic calcification in the abdominal aorta without aneurysm.  L1-2:  Mild disc and facet degeneration without spinal stenosis  L2-3: Diffuse disc bulging. Bilateral facet hypertrophy. Mild spinal stenosis.  L3-4: Diffuse disc bulging. Bilateral facet hypertrophy. Mild spinal stenosis.  L4-5: Diffuse bulging of the disc. Bilateral facet and ligamentum flavum hypertrophy. Mild spinal stenosis. Neural foramina adequately patent  L5-S1: Mild disc degeneration. Moderate facet hypertrophy without significant spinal or foraminal stenosis.  IMPRESSION: Surgical fusion at C6-7. Multilevel cervical degenerative change without significant spinal stenosis  Spinal cord stimulator at T7-8 in good position. Bilateral laminectomy T10 and T11. Calcified chronic disc protrusion on the left at T11-T12. Thoracic facet degeneration as above  Lumbar degenerative changes as above. Mild spinal stenosis at L2-3,  L3-4, and L4-5 due to disc and facet degeneration  No acute spinal abnormality.   Electronically Signed By: Franchot Gallo M.D. On: 05/17/2015 16:13    Narrative CLINICAL DATA:  Chronic low back pain. Left-sided sciatica. Personal history of spinal cord stimulator.  EXAM: CT MYELOGRAPHY LUMBAR SPINE  TECHNIQUE: CT imaging of the lumbar spine was performed after Isovue 300 M contrast administration.  Multiplanar CT image reconstructions were also generated.  COMPARISON:  MRI of the lumbar spine 07/03/2011.  FINDINGS: Opacification of the subarachnoid space is excellent. Injection was performed by Dr. Gershon Mussel Register  Segmentation: 5 non rib-bearing lumbar type vertebral bodies are present.  Alignment: AP alignment is anatomic.  Vertebrae: Vertebral body heights are maintained. No acute or healing fractures are present.  Conus medullaris: Extends to the T12-L1 level and appears normal.  Paraspinal and other soft tissues: Atherosclerotic calcifications are present in the aorta and branch vessels without aneurysm.  Disc levels:  L1-2: Moderate facet hypertrophy is present bilaterally.  L2-3: Asymmetric left-sided facet hypertrophy and spurring is present without significant stenosis.  L3-4: A mild broad-based disc bulge is present. Short pedicles and mild facet hypertrophy is noted bilaterally. There is no focal stenosis.  L4-5: A broad-based disc protrusion is present. Asymmetric left-sided facet hypertrophy ligamentum flavum thickening is present. This contributes to mild left subarticular stenosis. Mild foraminal narrowing is worse on the left. Short pedicles contribute.  L5-S1: No significant disc protrusion is present. Moderate facet hypertrophy is noted bilaterally. There is no focal stenosis.  IMPRESSION: 1. Broad-based disc protrusion and facet hypertrophy at L4-5 with mild left subarticular stenosis and left greater than right foraminal narrowing. 2. Multilevel facet disease through the remainder lumbar spine without focal stenosis elsewhere.   Electronically Signed By: San Morelle M.D. On: 06/25/2017 09:52  Narrative CLINICAL DATA:  Low back pain.  Prior surgery.  EXAM: LUMBAR MYELOGRAM  FLUOROSCOPY TIME:  3 minutes 36 seconds.  PROCEDURE: After thorough discussion of risks and benefits of the procedure including bleeding, infection,  injury to nerves, blood vessels, adjacent structures as well as headache and CSF leak, written and oral informed consent was obtained. Consent was obtained by Dr. Marcello Moores Register. Time out form was completed.  Patient was positioned prone on the fluoroscopy table. Local anesthesia was provided with 1% lidocaine without epinephrine after prepped and draped in the usual sterile fashion. Puncture was performed at L4-L5 using a 3 1/2 inch 22-gauge spinal needle via (_ left ___Paramedian) approach. Using a single pass through the dura, the needle was placed within the thecal sac, with return of clear CSF. 27m of Isovue M- 300 was injected into the thecal sac, with normal opacification of the nerve roots and cauda equina consistent with free flow within the subarachnoid space.  I personally performed the lumbar puncture and administered the intrathecal contrast. I also personally supervised acquisition of the myelogram images.  TECHNIQUE: Contiguous axial images were obtained through the Lumbar spine after the intrathecal infusion of infusion. Coronal and sagittal reconstructions were obtained of the axial image sets.  COMPARISON:  CT 05/17/2015.  MRI 07/03/2011.  FINDINGS: LUMBAR MYELOGRAM FINDINGS:  Degenerative changes noted of the lumbar spine. Reference is made to postmyelogram CT for further findings.  CT LUMBAR MYELOGRAM FINDINGS:  Reference is made to postmyelogram CT report.  IMPRESSION: LUMBAR MYELOGRAM IMPRESSION:  Degenerate changes lumbar spine. Reference is made to postmyelogram CT for further findings  CT LUMBAR MYELOGRAM IMPRESSION:  Reference is made to postmyelogram CT report.  Electronically Signed By: Marcello Moores  Register On: 06/25/2017 09:42   Narrative CLINICAL DATA:  Previous cervical fusion. Left arm pain, numbness in weakness.  FLUOROSCOPY TIME:  1 minutes 11 seconds. 177.36 micro gray meter squared  PROCEDURE: LUMBAR PUNCTURE FOR CERVICAL  MYELOGRAM  After thorough discussion of risks and benefits of the procedure including bleeding, infection, injury to nerves, blood vessels, adjacent structures as well as headache and CSF leak, written and oral informed consent was obtained. Consent was obtained by Dr. Nelson Chimes. We discussed the high likelihood of obtaining a diagnostic study.  Patient was positioned prone on the fluoroscopy table. Local anesthesia was provided with 1% lidocaine without epinephrine after prepped and draped in the usual sterile fashion. Puncture was performed at left L4-5 using a 3 1/2 inch 22-gauge spinal needle via left para median approach. Using a single pass through the dura, the needle was placed within the thecal sac, with return of clear CSF. 10 mL of Isovue-300 was injected into the thecal sac, with normal opacification of the nerve roots and cauda equina consistent with free flow within the subarachnoid space. The patient was then moved to the trendelenburg position and contrast flowed into the Cervical spine region.  I personally performed the lumbar puncture and administered the intrathecal contrast. I also personally performed acquisition of the myelogram images.  TECHNIQUE: Contiguous axial images were obtained through the Cervical spine after the intrathecal infusion of infusion. Coronal and sagittal reconstructions were obtained of the axial image sets.  FINDINGS: CERVICAL MYELOGRAM FINDINGS:  Previous ACDF at C6-7 appears solid. There is wide patency of the central canal. No diminished root sleeve filling is demonstrated. Small anterior extradural defects at C3-4 and C4-5 without apparent neural compression. Flexion extension views do not show abnormal motion.  CT CERVICAL MYELOGRAM FINDINGS:  Foramen magnum is widely patent. Ordinary osteoarthritis at the C1-2 articulation.  C2-3:  Normal interspace.  C3-4: Mild bilateral uncovertebral degeneration. No  compressive narrowing of the canal or foramina. No facet arthropathy.  C4-5: Mild bilateral uncovertebral degeneration. No compressive narrowing of the canal or foramina. Mild facet osteoarthritis on the right but no canal or foraminal stenosis.  C5-6: Mild bilateral uncovertebral degeneration. No facet arthropathy. No canal or foraminal stenosis.  C6-7: Previous ACDF with solid fusion and wide patency of the canal and foramina.  C7-T1: No disc pathology. Mild bilateral facet degeneration with mild osteophytic encroachment upon the foramina but without visible neural compression.  T1-2:  Mild facet osteoarthritis.  No canal or foraminal stenosis.  IMPRESSION: No cause of the presenting symptoms is identified.  Good appearance at the fusion level of C6-7.  Mild uncovertebral hypertrophy at C3-4, C4-5 and C5-6 but without significant canal or foraminal narrowing.  Bilateral facet degeneration at C7-T1 with mild osteophytic encroachment upon the foramina but no apparent compressive stenosis.   Electronically Signed By: Nelson Chimes M.D. On: 05/08/2016 11:34   Complexity Note: Imaging results reviewed. Results shared with Jeff [name], using Layman's terms.                         ROS  Cardiovascular: High blood pressure and Blood thinners:  Antiplatelet Pulmonary or Respiratory: Snoring  and Temporary stoppage of breathing during sleep Neurological: Curved spine Psychological-Psychiatric: Depressed Gastrointestinal: No reported gastrointestinal signs or symptoms such as vomiting or evacuating blood, reflux, heartburn, alternating episodes of diarrhea and constipation, inflamed or scarred liver, or pancreas or irrregular and/or infrequent bowel movements Genitourinary: No reported  renal or genitourinary signs or symptoms such as difficulty voiding or producing urine, peeing blood, non-functioning kidney, kidney stones, difficulty emptying the bladder, difficulty controlling the  flow of urine, or chronic kidney disease Hematological: No reported hematological signs or symptoms such as prolonged bleeding, low or poor functioning platelets, bruising or bleeding easily, hereditary bleeding problems, low energy levels due to low hemoglobin or being anemic Endocrine: High blood sugar requiring insulin (IDDM) Rheumatologic: No reported rheumatological signs and symptoms such as fatigue, joint pain, tenderness, swelling, redness, heat, stiffness, decreased range of motion, with or without associated rash Musculoskeletal: Negative for myasthenia gravis, muscular dystrophy, multiple sclerosis or malignant hyperthermia Work History: Retired  Allergies  Jeff [name] is allergic to topamax [topiramate], keppra [levetiracetam], neurontin [gabapentin], and cymbalta [duloxetine hcl].  Laboratory Chemistry Profile   Renal Lab Results  Component Value Date   BUN 22 03/25/2021   CREATININE 1.40 (H) 03/25/2021   BCR 16 03/25/2021   GFRAA 71 03/21/2020   GFRNONAA 62 03/21/2020   SPECGRAV 1.015 03/11/2016   PHUR 5.0 03/11/2016   PROTEINUR neg 03/11/2016     Electrolytes Lab Results  Component Value Date   NA 137 03/25/2021   K 4.5 03/25/2021   CL 96 03/25/2021   CALCIUM 9.6 03/25/2021   PHOS 3.8 07/17/2015     Hepatic Lab Results  Component Value Date   AST 15 03/25/2021   ALT 15 03/25/2021   ALBUMIN 4.8 03/25/2021   ALKPHOS 110 03/25/2021     ID Lab Results  Component Value Date   STAPHAUREUS NEGATIVE 06/16/2011   MRSAPCR NEGATIVE 01/28/2013     Bone Lab Results  Component Value Date   TESTOSTERONE 74 (L) 03/25/2021     Endocrine Lab Results  Component Value Date   GLUCOSE 173 (H) 03/25/2021   HGBA1C 7.4 (H) 03/25/2021   TSH 1.410 03/25/2021   TESTOSTERONE 74 (L) 03/25/2021     Neuropathy Lab Results  Component Value Date   HGBA1C 7.4 (H) 03/25/2021     CNS No results found for: COLORCSF, APPEARCSF, RBCCOUNTCSF, WBCCSF, POLYSCSF, LYMPHSCSF,  EOSCSF, PROTEINCSF, GLUCCSF, JCVIRUS, CSFOLI, IGGCSF, LABACHR, ACETBL, LABACHR, ACETBL   Inflammation (CRP: Acute  ESR: Chronic) No results found for: CRP, ESRSEDRATE, LATICACIDVEN   Rheumatology No results found for: RF, ANA, LABURIC, URICUR, LYMEIGGIGMAB, LYMEABIGMQN, HLAB27   Coagulation Lab Results  Component Value Date   INR 0.93 06/16/2011   LABPROT 12.7 06/16/2011   PLT 228 03/25/2021     Cardiovascular Lab Results  Component Value Date   CKTOTAL 46 01/28/2013   CKMB 1.4 01/28/2013   TROPONINI <0.03 01/03/2016   HGB 18.5 (H) 03/25/2021   HCT 53.1 (H) 03/25/2021     Screening Lab Results  Component Value Date   STAPHAUREUS NEGATIVE 06/16/2011   MRSAPCR NEGATIVE 01/28/2013     Cancer No results found for: CEA, CA125, LABCA2   Allergens No results found for: ALMOND, APPLE, ASPARAGUS, AVOCADO, BANANA, BARLEY, BASIL, BAYLEAF, GREENBEAN, LIMABEAN, WHITEBEAN, BEEFIGE, REDBEET, BLUEBERRY, BROCCOLI, CABBAGE, MELON, CARROT, CASEIN, CASHEWNUT, CAULIFLOWER, CELERY     Note: Lab results reviewed.  PFSH  Drug: Jeff [name]  reports no history of drug use. Alcohol:  reports no history of alcohol use. Tobacco:  reports that he quit smoking about 12 years ago. His smoking use included cigarettes. He has a 45.00 pack-year smoking history. He quit smokeless tobacco use about 25 years ago. Medical:  has a past medical history of Actinic keratosis, Cancer ([place]), Depression, Diabetes mellitus without complication (  [place]), Dyspnea, Fatigue, History of basal cell carcinoma (BCC) (05/21/2016), dysplastic nevus (09/26/2013), Hyperlipidemia, Hypertension, Hypogonadism male, Neuromuscular disorder ([place]), Neuropathy, PAD (peripheral artery disease) ([place]), Sleep apnea, Tinnitus, and Tuberculosis. Family: family history includes Atrial fibrillation in his mother; Cancer in his maternal grandmother and maternal uncle; Coronary artery disease in his father; Dementia in his mother; Diabetes in his  father; Heart disease in his father; Hyperlipidemia in his father; Hypertension in his father and mother; Migraines in his daughter; Transient ischemic attack in his mother.  Past Surgical History:  Procedure Laterality Date   AMPUTATION TOE Right 03/04/2018   Procedure: AMPUTATION TOE/MPJ JOINT FIFTH RIGHT;  Surgeon: Edrick Kins, DPM;  Location: [place];  Service: Podiatry;  Laterality: Right;   BACK SURGERY  2012   neck was 2012,back same year. plate in [id number]   CARDIAC CATHETERIZATION  01/31/2013   Medical management   CARPAL TUNNEL RELEASE Bilateral    CATARACT EXTRACTION Right 2014   CATARACT EXTRACTION W/PHACO Left 03/12/2016   Procedure: CATARACT EXTRACTION PHACO AND INTRAOCULAR LENS PLACEMENT ([place]);  Surgeon: Birder Robson, MD;  Location: [facility];  Service: Ophthalmology;  Laterality: Left;  [place] 00:31AP% 17.9CDE 5.67Fluid pack lot # [id number]   CERVICAL FUSION  2012   CORONARY ANGIOPLASTY  2014   all good   KNEE ARTHROSCOPY Left    LEFT HEART CATHETERIZATION WITH CORONARY ANGIOGRAM N/A 01/31/2013   Procedure: LEFT HEART CATHETERIZATION WITH CORONARY ANGIOGRAM;  Surgeon: Minus Breeding, MD;  Location: [facility] C[facility];  Service: Cardiovascular;  Laterality: N/A;   LUMBAR LAMINECTOMY/DECOMPRESSION MICRODISCECTOMY  06/22/2011   Procedure: LUMBAR LAMINECTOMY/DECOMPRESSION MICRODISCECTOMY;  Surgeon: Ophelia Charter;  Location: [place] [facility];  Service: Neurosurgery;  Laterality: N/A;  Thoracic Ten-Eleven,Thoracic Eleven-Twelve Laminectomy   Pain stimulator     ulnar N/A    VASECTOMY  1991   Active Ambulatory Problems    Diagnosis Date Noted   Thoracic spinal stenosis 06/22/2011   Chest pain 01/28/2013   Seizures ([place]) 01/28/2013   Syncope 01/28/2013   Fatigue 03/17/2013   Abnormal kidney function 12/06/2014   Cervical nerve root disorder 12/06/2014   Colon polyp 12/06/2014   Clinical depression 12/06/2014   Essential (primary) hypertension 12/06/2014   Cephalalgia 12/06/2014    Bulge of cervical disc without myelopathy 12/06/2014   HLD (hyperlipidemia) 12/06/2014   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   Displacement of lumbar intervertebral disc without myelopathy 12/06/2014   L-S radiculopathy 12/06/2014   Mild major depression ([place]) 12/06/2014   Neuropathy 12/06/2014   Adiposity 12/06/2014   Peripheral vascular disease ([place]) 12/06/2014   Diabetes mellitus, type 2 ([place]) 12/06/2014   Cervical post-laminectomy syndrome 10/04/2013   Polypharmacy 08/02/2012   Bernhardt's paresthesia 08/02/2012   Chronic neck pain 10/04/2013   Peripheral neuropathic pain 10/04/2013   Chronic pain syndrome 08/17/2012   Post laminectomy syndrome 08/02/2012   DDD (degenerative disc disease), cervical 02/25/2016   S/P cervical spinal fusion 02/25/2016   Cervical facet syndrome 02/25/2016   DDD (degenerative disc disease), lumbar 02/25/2016   Facet syndrome, lumbar 02/25/2016   Sacroiliac joint dysfunction 02/25/2016   Atherosclerotic peripheral vascular disease ([place]) 02/25/2016   ASCVD (arteriosclerotic cardiovascular disease) 02/22/2019   Infected ulcer of skin ([place]) 04/17/2019   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[place]) 03/27/2021   Opioid-induced hyperalgesia 03/27/2021   Spinal cord stimulator status 03/27/2021   History of lumbar laminectomy 03/27/2021   Moderate episode of recurrent major depressive disorder ([place]) 03/27/2021   Resolved Ambulatory Problems    Diagnosis Date Noted  Hypokalemia 01/30/2013   Positive D dimer 01/30/2013   Lyme disease 12/06/2014   Failed back syndrome of thoracic spine 10/04/2013   Past Medical History:  Diagnosis Date   Actinic keratosis    Cancer ([place])    Depression    Diabetes mellitus without complication ([place])    Dyspnea    History of basal cell carcinoma (BCC) 05/21/2016   Hx of dysplastic nevus 09/26/2013   Hyperlipidemia    Hypertension    Hypogonadism male    Neuromuscular disorder ([facility])    PAD (peripheral artery  disease) (H[facility])    Sleep apnea    Tinnitus    Tuberculosis    Constitutional Exam  General appearance: Well nourished, well developed, and well hydrated. In no apparent acute distress Vitals:   03/27/21 0852  BP: 130/67  Pulse: (!) 53  Resp: 16  Temp: (!) 96.8 F (36 C)  TempSrc: Temporal  SpO2: 100%  Weight: 208 lb (94.3 kg)  Height: '5\' 8"'  (1.727 m)   BMI Assessment: Estimated body mass index is 31.63 kg/m as calculated from the following:   Height as of this encounter: '5\' 8"'  (1.727 m).   Weight as of this encounter: 208 lb (94.3 kg).  BMI interpretation table: BMI level Category Range association with higher incidence of chronic pain  <18 kg/m2 Underweight   18.5-24.9 kg/m2 Ideal body weight   25-29.9 kg/m2 Overweight Increased incidence by 20%  30-34.9 kg/m2 Obese (Class I) Increased incidence by 68%  35-39.9 kg/m2 Severe obesity (Class II) Increased incidence by 136%  >40 kg/m2 Extreme obesity (Class III) Increased incidence by 254%   Patient's current BMI Ideal Body weight  Body mass index is 31.63 kg/m. Ideal body weight: 68.4 kg (150 lb 12.7 oz) Adjusted ideal body weight: 78.8 kg (173 lb 10.8 oz)   BMI Readings from Last 4 Encounters:  03/27/21 31.63 kg/m  03/24/21 31.47 kg/m  01/22/21 32.23 kg/m  10/22/20 32.39 kg/m   Wt Readings from Last 4 Encounters:  03/27/21 208 lb (94.3 kg)  03/24/21 207 lb (93.9 kg)  01/22/21 212 lb (96.2 kg)  10/22/20 213 lb (96.6 kg)    Psych/Mental status: Alert, oriented x 3 (person, place, & time)       Eyes: PERLA Respiratory: No evidence of acute respiratory distress  Cervical Spine Area Exam  Skin & Axial Inspection: Well healed scar from previous spine surgery detected Alignment: Symmetrical Functional ROM: Pain restricted ROM      Stability: No instability detected Muscle Tone/Strength: Functionally intact. No obvious neuro-muscular anomalies detected. Sensory (Neurological): Dermatomal pain pattern   Upper  Extremity (UE) Exam    Side: Right upper extremity  Side: Left upper extremity  Skin & Extremity Inspection: Skin color, temperature, and hair growth are WNL. No peripheral edema or cyanosis. No masses, redness, swelling, asymmetry, or associated skin lesions. No contractures.  Skin & Extremity Inspection: Skin color, temperature, and hair growth are WNL. No peripheral edema or cyanosis. No masses, redness, swelling, asymmetry, or associated skin lesions. No contractures.  Functional ROM: Unrestricted ROM          Functional ROM: Decreased ROM          Muscle Tone/Strength: Functionally intact. No obvious neuro-muscular anomalies detected.  Muscle Tone/Strength: Functionally intact. No obvious neuro-muscular anomalies detected.  Sensory (Neurological): Unimpaired          Sensory (Neurological): Dermatomal pain pattern          Palpation: No palpable anomalies  Palpation: No palpable anomalies              Provocative Test(s):  Phalen's test: deferred Tinel's test: deferred Apley's scratch test (touch opposite shoulder):  Action 1 (Across chest): deferred Action 2 (Overhead): deferred Action 3 (LB reach): deferred   Provocative Test(s):  Phalen's test: deferred Tinel's test: deferred Apley's scratch test (touch opposite shoulder):  Action 1 (Across chest): Decreased ROM Action 2 (Overhead): Decreased ROM Action 3 (LB reach): Decreased ROM    Lumbar Spine Area Exam  Skin & Axial Inspection: Lumbar Scoliosis Alignment: Symmetrical Functional ROM: Pain restricted ROM       Stability: No instability detected Muscle Tone/Strength: Functionally intact. No obvious neuro-muscular anomalies detected. Sensory (Neurological): Dermatomal pain pattern  Gait & Posture Assessment  Ambulation: Limited Gait: Antalgic gait (limping) Posture: Difficulty with positional changes   Lower Extremity Exam    Side: Right lower extremity  Side: Left lower extremity  Stability: No instability  observed          Stability: No instability observed          Skin & Extremity Inspection: Skin color, temperature, and hair growth are WNL. No peripheral edema or cyanosis. No masses, redness, swelling, asymmetry, or associated skin lesions. No contractures.  Skin & Extremity Inspection: Skin color, temperature, and hair growth are WNL. No peripheral edema or cyanosis. No masses, redness, swelling, asymmetry, or associated skin lesions. No contractures.  Functional ROM: Pain restricted ROM                  Functional ROM: Pain restricted ROM                  Muscle Tone/Strength: Functionally intact. No obvious neuro-muscular anomalies detected.  Muscle Tone/Strength: Functionally intact. No obvious neuro-muscular anomalies detected.  Sensory (Neurological): Dermatomal pain pattern        Sensory (Neurological): Dermatomal pain pattern        DTR: Patellar: deferred today Achilles: deferred today Plantar: deferred today  DTR: Patellar: deferred today Achilles: deferred today Plantar: deferred today  Palpation: No palpable anomalies  Palpation: No palpable anomalies    Assessment  Primary Diagnosis & Pertinent Problem List: The primary encounter diagnosis was Chronic pain syndrome. Diagnoses of 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[place]), Opioid-induced hyperalgesia, Spinal cord stimulator status, S/P cervical spinal fusion, History of lumbar laminectomy, and Moderate episode of recurrent major depressive disorder ([place]) were also pertinent to this visit.  Visit Diagnosis (New problems to examiner): 1. Chronic pain syndrome   2. 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HCC)   3. Opioid-induced hyperalgesia   4. Spinal cord stimulator status   5. S/P cervical spinal fusion   6. History of lumbar laminectomy   7. Moderate episode of recurrent major depressive disorder [facility])    Plan of Care    I had an extensive discussion with the patient regarding his treatment options.   He is primarily interested in having his opioid medications continued.  I expressed concerns regarding his high dose.  We discussed the phenomena of central sensitization and opioid-induced hyperalgesia.  This was further reinforced by the patient's own personal experience that he told me about.  He states that he ran out of his medications a day early a couple of days ago and was without them for a full day.  He states that he was not able to function and was bedridden.  We discussed dependence phenomena.  Patient also has a history of depression and  is currently on Abilify.  It seems that these medications have limited efficacy currently and I recommended that he wean his dose down.  I recommended that he follow-up with a psychiatrist to consider medication assisted treatment for opioid dependence.  I instructed him to follow-up with Dr. Primus Bravo to discuss a weaning plan.    Provider-requested follow-up: No follow-ups on file.  Future Appointments  Date Time Provider [place]  04/01/2021  8:45 AM Felipa Furnace, [place] [facility]  04/28/2021  1:20 PM Jerrol Banana., MD [facility]  06/24/2021 10:00 AM Brendolyn Patty, MD [facility] None  07/15/2021  1:40 PM Jerrol Banana., MD [facility]    Note by: Gillis Santa, MD Date: 03/27/2021; Time: 9:31 AM

## 2021-03-27 NOTE — Progress Notes (Signed)
Safety precautions to be maintained throughout the outpatient stay will include: orient to surroundings, keep bed in low position, maintain call bell within reach at all times, provide assistance with transfer out of bed and ambulation.  

## 2021-04-01 ENCOUNTER — Ambulatory Visit: Payer: 59 | Admitting: Podiatry

## 2021-04-01 ENCOUNTER — Other Ambulatory Visit: Payer: Self-pay

## 2021-04-01 DIAGNOSIS — Q6671 Congenital pes cavus, right foot: Secondary | ICD-10-CM | POA: Diagnosis not present

## 2021-04-01 DIAGNOSIS — E0843 Diabetes mellitus due to underlying condition with diabetic autonomic (poly)neuropathy: Secondary | ICD-10-CM

## 2021-04-01 DIAGNOSIS — Q6672 Congenital pes cavus, left foot: Secondary | ICD-10-CM | POA: Diagnosis not present

## 2021-04-01 DIAGNOSIS — Q667 Congenital pes cavus, unspecified foot: Secondary | ICD-10-CM

## 2021-04-01 NOTE — Progress Notes (Signed)
Subjective:  Patient ID: Jeff [name], male    DOB: Sep 04, 1960,  MRN: [id number]  Chief Complaint  Patient presents with   Nail Problem    Nails not growing back     60 y.o. male presents with the above complaint.  Patient presents with arch and heel pain with first MPJ pain.  Patient states he works a lot hard on his foot.  He states he has wore composite toe.  He is a diabetic with last A1c of 7.4.  He would like to know if he could get any kind of orthotics or insoles to come help with this.  He had insoles long time ago about 25 to 30 years ago but has lost them since then.  He denies any other acute complaints.  He would like to discuss treatment options.   Review of Systems: Negative except as noted in the HPI. Denies N/V/F/Ch.  Past Medical History:  Diagnosis Date   Actinic keratosis    Cancer ([place])    SKIN    Depression    Diabetes mellitus without complication ([place])    Type II   Dyspnea    with exertion    Fatigue    History of basal cell carcinoma (BCC) 05/21/2016   right spinal upper back/superficial   Hx of dysplastic nevus 09/26/2013   right malar cheek/mild   Hyperlipidemia    Hypertension    Hypogonadism male    Neuromuscular disorder ([place])    "back nerve stimulator"   Neuropathy    PAD (peripheral artery disease) ([place])    Sleep apnea    CPAP   Tinnitus    Tuberculosis    POSITIVE  TB SKIN TEST 1992.6 MTH TX .was exposed to someone who had it.    Current Outpatient Medications:    ARIPiprazole (ABILIFY) 5 MG tablet, TAKE 1 TABLET BY MOUTH EVERY DAY, Disp: 90 tablet, Rfl: 4   aspirin 81 MG tablet, Take 81 mg by mouth daily., Disp: , Rfl:    baclofen (LIORESAL) 10 MG tablet, TAKE 1 TABLET BY MOUTH 3 TIMES A DAY AS NEEDED FOR SPASMS (Patient not taking: Reported on 03/27/2021), Disp: , Rfl:    clomiPHENE (CLOMID) 50 MG tablet, Take 0.5 tablets (25 mg total) by mouth daily., Disp: 30 tablet, Rfl: 0   gentamicin cream (GARAMYCIN) 0.1 %, Apply 1  application topically 2 (two) times daily., Disp: 30 g, Rfl: 1   hydrochlorothiazide (HYDRODIURIL) 25 MG tablet, TAKE 1 TABLET BY MOUTH EVERY DAY. *INSURANCE ONLY COVERS 30 DAYS**, Disp: 30 tablet, Rfl: 5   hydrocortisone 2.5 % cream, Apply topically., Disp: , Rfl:    ibuprofen (ADVIL) 400 MG tablet, Take 400 mg by mouth 3 (three) times daily as needed., Disp: , Rfl:    JARDIANCE 25 MG TABS tablet, TAKE 1 TABLET BY MOUTH EVERY DAY, Disp: 12 tablet, Rfl: 0   losartan (COZAAR) 100 MG tablet, TAKE 1 TABLET BY MOUTH EVERY DAY, Disp: 30 tablet, Rfl: 11   LYRICA 100 MG capsule, LIMIT 1 CAPSULE BY MOUTH 3 - 5 TIMES PER DAY IF TOLERATED (NOTE THAT CAPSULE IS NOW 100 MG SIZE), Disp: , Rfl: 0   metFORMIN (GLUCOPHAGE) 1000 MG tablet, TAKE 1 TABLET BY MOUTH TWICE A DAY WITH MEALS, Disp: 60 tablet, Rfl: 0   methocarbamol (ROBAXIN) 500 MG tablet, SMARTSIG:0.5-1 Tablet(s) By Mouth 2-3 Times Daily, Disp: , Rfl:    metoprolol succinate (TOPROL-XL) 50 MG 24 hr tablet, TAKE 1 TABLET BY MOUTH EVERY DAY,  Disp: 30 tablet, Rfl: 2   mometasone (ELOCON) 0.1 % cream, Apply 1 application topically daily., Disp: 45 g, Rfl: 0   ONETOUCH VERIO test strip, CHECK SUGAR ONCE DAILY DX E11.9, Disp: 25 strip, Rfl: 31   oxyCODONE (ROXICODONE) 15 MG immediate release tablet, SMARTSIG:1 Tablet(s) By Mouth 4-6 Times Daily, Disp: , Rfl:    rosuvastatin (CRESTOR) 40 MG tablet, Take 1 tablet (40 mg total) by mouth daily., Disp: 30 tablet, Rfl: 11   sildenafil (REVATIO) 20 MG tablet, TAKE 1 TO 5 TABLETS BY MOUTH DAILY AS NEEDED, Disp: 25 tablet, Rfl: 10   venlafaxine (EFFEXOR) 75 MG tablet, TAKE 3 TABLETS (225 MG TOTAL) BY MOUTH DAILY., Disp: 36 tablet, Rfl: 0  Social History   Tobacco Use  Smoking Status Former   Packs/day: 1.50   Years: 30.00   Pack years: 45.00   Types: Cigarettes   Quit date: 2010   Years since quitting: 12.6  Smokeless Tobacco Former   Quit date: 12/16/1995    Allergies  Allergen Reactions   Topamax  [Topiramate] Other (See Comments)    unresponsive  Episodes ? SYNCOPE ?   Keppra [Levetiracetam] Palpitations and Other (See Comments)    "jittery", and "loopy." per pt.   Neurontin [Gabapentin] Other (See Comments)    TREMORS "jittery" and "loopy" per pt.   Cymbalta [Duloxetine Hcl] Other (See Comments)    "jittery" and "loopy" per pt   Objective:  There were no vitals filed for this visit. There is no height or weight on file to calculate BMI. Constitutional Well developed. Well nourished.  Vascular Dorsalis pedis pulses palpable bilaterally. Posterior tibial pulses palpable bilaterally. Capillary refill normal to all digits.  No cyanosis or clubbing noted. Pedal hair growth normal.  Neurologic Normal speech. Oriented to person, place, and time. Epicritic sensation to light touch grossly present bilaterally.  Dermatologic Nails well groomed and normal in appearance. No open wounds. No skin lesions.  Orthopedic: Gait examination shows pes cavus foot structure with high arched foot structure excessive pressure to submetatarsal 1 bilaterally.  Positive Silfverskiold test with gastrocnemius equinus.  None reducible with Coleman block test.   Radiographs: None Assessment:   1. Diabetes mellitus due to underlying condition with diabetic autonomic neuropathy, unspecified whether long term insulin use ([place])   2. Pes cavus    Plan:  Patient was evaluated and treated and all questions answered.  Pes cavus -I explained to patient the etiology of pes cavus foot structure and various treatment options were extensively discussed. -Given that he is having some arch and heel limited pain and has to wear a composite toe boots I believe patient will benefit from custom-made orthotics to help control the hindfoot motion support the arch of the foot.  I will also incorporated Morton's extension reverse to take the pressure off of the first MPJ joint.  I discussed this with the patient extensive  detail he states understand would like to make shoe gear modification as well as obtain orthotics -He was casted for orthotics today  No follow-ups on file.

## 2021-04-02 ENCOUNTER — Telehealth: Payer: Self-pay

## 2021-04-02 DIAGNOSIS — R5383 Other fatigue: Secondary | ICD-10-CM

## 2021-04-02 NOTE — Telephone Encounter (Signed)
Patient advised. Agreed for referral to Urology.

## 2021-04-02 NOTE — Telephone Encounter (Signed)
-----   Message from Jerrol Banana., MD sent at 04/01/2021  1:09 PM EDT ----- Labs stable.  But testosterone very low.  Would like to refer him back to urology as he is already on Clomid for low T but is testosterone remains very low.  Please advise patient.

## 2021-04-15 NOTE — Addendum Note (Signed)
Addended by: Brendolyn Patty on: 04/15/2021 06:35 PM   Modules accepted: Level of Service

## 2021-04-20 NOTE — Progress Notes (Signed)
04/21/2021 9:27 AM   Jeff [name] October 23, 1960 [id number]  Referring provider: Jerrol Banana., MD [address] [place],  [place] [address]  Chief Complaint  Patient presents with   Hypogonadism    HPI: 59 y.o. male presents for evaluation of hypogonadism.  Seen 09/08/2018 for erectile dysfunction He was having no partial erectile activity PDE 5 inhibitors were not effective Testosterone level was 132/LH 7.0 Had recommended a trial of Clomid however he never started the medication Repeat testosterone level 03/25/2021 was 74; prolactin 7.3 Significant tiredness/fatigue Significant decreased libido   PMH: Past Medical History:  Diagnosis Date   Actinic keratosis    Cancer ([place])    SKIN    Depression    Diabetes mellitus without complication ([place])    Type II   Dyspnea    with exertion    Fatigue    History of basal cell carcinoma (BCC) 05/21/2016   right spinal upper back/superficial   Hx of dysplastic nevus 09/26/2013   right malar cheek/mild   Hyperlipidemia    Hypertension    Hypogonadism male    Neuromuscular disorder ([place])    "back nerve stimulator"   Neuropathy    PAD (peripheral artery disease) ([place])    Sleep apnea    CPAP   Tinnitus    Tuberculosis    POSITIVE  TB SKIN TEST 1992.6 MTH TX .was exposed to someone who had it.    Surgical History: Past Surgical History:  Procedure Laterality Date   AMPUTATION TOE Right 03/04/2018   Procedure: AMPUTATION TOE/MPJ JOINT FIFTH RIGHT;  Surgeon: Edrick Kins, DPM;  Location: [facility];  Service: Podiatry;  Laterality: Right;   BACK SURGERY  2012   neck was 2012,back same year. plate in [id number]   CARDIAC CATHETERIZATION  01/31/2013   Medical management   CARPAL TUNNEL RELEASE Bilateral    CATARACT EXTRACTION Right 2014   CATARACT EXTRACTION W/PHACO Left 03/12/2016   Procedure: CATARACT EXTRACTION PHACO AND INTRAOCULAR LENS PLACEMENT ([place]);  Surgeon: Birder Robson, MD;  Location:  [facility];  Service: Ophthalmology;  Laterality: Left;  [place] 00:31AP% 17.9CDE 5.67Fluid pack lot # [id number]   CERVICAL FUSION  2012   CORONARY ANGIOPLASTY  2014   all good   KNEE ARTHROSCOPY Left    LEFT HEART CATHETERIZATION WITH CORONARY ANGIOGRAM N/A 01/31/2013   Procedure: LEFT HEART CATHETERIZATION WITH CORONARY ANGIOGRAM;  Surgeon: Minus Breeding, MD;  Location: [facility] C[facility];  Service: Cardiovascular;  Laterality: N/A;   LUMBAR LAMINECTOMY/DECOMPRESSION MICRODISCECTOMY  06/22/2011   Procedure: LUMBAR LAMINECTOMY/DECOMPRESSION MICRODISCECTOMY;  Surgeon: Ophelia Charter;  Location: [facility] NEURO ORS;  Service: Neurosurgery;  Laterality: N/A;  Thoracic Ten-Eleven,Thoracic Eleven-Twelve Laminectomy   Pain stimulator     ulnar N/A    VASECTOMY  1991    Home Medications:  Allergies as of 04/21/2021       Reactions   Topamax [topiramate] Other (See Comments)   unresponsive  Episodes ? SYNCOPE ?   Keppra [levetiracetam] Palpitations, Other (See Comments)   "jittery", and "loopy." per pt.   Neurontin [gabapentin] Other (See Comments)   TREMORS "jittery" and "loopy" per pt.   Cymbalta [duloxetine Hcl] Other (See Comments)   "jittery" and "loopy" per pt        Medication List        Accurate as of April 21, 2021  9:27 AM. If you have any questions, ask your nurse or doctor.          STOP  taking these medications    clomiPHENE 50 MG tablet Commonly known as: CLOMID Stopped by: Abbie Sons, MD       TAKE these medications    ARIPiprazole 5 MG tablet Commonly known as: ABILIFY TAKE 1 TABLET BY MOUTH EVERY DAY   aspirin 81 MG tablet Take 81 mg by mouth daily.   baclofen 10 MG tablet Commonly known as: LIORESAL   gentamicin cream 0.1 % Commonly known as: GARAMYCIN Apply 1 application topically 2 (two) times daily.   hydrochlorothiazide 25 MG tablet Commonly known as: HYDRODIURIL TAKE 1 TABLET BY MOUTH EVERY DAY. *INSURANCE ONLY COVERS 30 DAYS**    hydrocortisone 2.5 % cream Apply topically.   ibuprofen 400 MG tablet Commonly known as: ADVIL Take 400 mg by mouth 3 (three) times daily as needed.   Jardiance 25 MG Tabs tablet Generic drug: empagliflozin TAKE 1 TABLET BY MOUTH EVERY DAY   losartan 100 MG tablet Commonly known as: COZAAR TAKE 1 TABLET BY MOUTH EVERY DAY   Lyrica 100 MG capsule Generic drug: pregabalin LIMIT 1 CAPSULE BY MOUTH 3 - 5 TIMES PER DAY IF TOLERATED (NOTE THAT CAPSULE IS NOW 100 MG SIZE)   metFORMIN 1000 MG tablet Commonly known as: GLUCOPHAGE TAKE 1 TABLET BY MOUTH TWICE A DAY WITH MEALS   methocarbamol 500 MG tablet Commonly known as: ROBAXIN SMARTSIG:0.5-1 Tablet(s) By Mouth 2-3 Times Daily   metoprolol succinate 50 MG 24 hr tablet Commonly known as: TOPROL-XL TAKE 1 TABLET BY MOUTH EVERY DAY   mometasone 0.1 % cream Commonly known as: Elocon Apply 1 application topically daily.   OneTouch Verio test strip Generic drug: glucose blood CHECK SUGAR ONCE DAILY DX E11.9   oxyCODONE 15 MG immediate release tablet Commonly known as: ROXICODONE SMARTSIG:1 Tablet(s) By Mouth 4-6 Times Daily   rosuvastatin 40 MG tablet Commonly known as: CRESTOR Take 1 tablet (40 mg total) by mouth daily.   sildenafil 20 MG tablet Commonly known as: REVATIO TAKE 1 TO 5 TABLETS BY MOUTH DAILY AS NEEDED   testosterone cypionate 200 MG/ML injection Commonly known as: DEPOTESTOSTERONE CYPIONATE Inject 1 mL (200 mg total) into the muscle every 14 (fourteen) days. Started by: Abbie Sons, MD   venlafaxine 75 MG tablet Commonly known as: EFFEXOR TAKE 3 TABLETS (225 MG TOTAL) BY MOUTH DAILY.        Allergies:  Allergies  Allergen Reactions   Topamax [Topiramate] Other (See Comments)    unresponsive  Episodes ? SYNCOPE ?   Keppra [Levetiracetam] Palpitations and Other (See Comments)    "jittery", and "loopy." per pt.   Neurontin [Gabapentin] Other (See Comments)    TREMORS "jittery" and  "loopy" per pt.   Cymbalta [Duloxetine Hcl] Other (See Comments)    "jittery" and "loopy" per pt    Family History: Family History  Problem Relation Age of Onset   Coronary artery disease Father        PPM in his late 13s, CAD Dx 60s   Hyperlipidemia Father    Hypertension Father    Heart disease Father        CABG at age 56   Diabetes Father    Atrial fibrillation Mother    Hypertension Mother    Transient ischemic attack Mother    Dementia Mother    Migraines Daughter    Cancer Maternal Uncle        Throat   Cancer Maternal Grandmother        Lung cancer  Social History:  reports that he quit smoking about 12 years ago. His smoking use included cigarettes. He has a 45.00 pack-year smoking history. He quit smokeless tobacco use about 25 years ago. He reports that he does not drink alcohol and does not use drugs.   Physical Exam: BP (!) 148/81   Pulse (!) 51   Ht '5\' 8"'$  (1.727 m)   Wt 210 lb (95.3 kg)   BMI 31.93 kg/m   Constitutional:  Alert and oriented, No acute distress. HEENT: [place] AT, moist mucus membranes.  Trachea midline, no masses. Cardiovascular: No clubbing, cyanosis, or edema. Respiratory: Normal respiratory effort, no increased work of breathing. GI: Abdomen is soft, nontender, nondistended, no abdominal masses GU: Prostate 30 g, smooth without nodules Psychiatric: Normal mood and affect.   Assessment & Plan:    1.  Hypogonadism Near castrate testosterone levels Significant symptoms Recommend starting TRT We discussed the most common forms of replacement including intramuscular injection and gels and he desires to start injections Rx testosterone cypionate-200 mg every 2 weeks to start Appointment will be made for injection training Follow-up 6 weeks after starting TRT for testosterone level and symptom check Potential side effects of testosterone replacement were discussed including stimulation of benign prostatic growth with lower urinary tract  symptoms; erythrocytosis; edema; gynecomastia; worsening sleep apnea; venous thromboembolism; testicular atrophy and infertility. Recent studies suggesting an increased incidence of heart attack and stroke in patients taking testosterone was discussed. He was informed there is conflicting evidence regarding the impact of testosterone therapy on cardiovascular risk. The theoretical risk of growth stimulation of an undetected prostate cancer was also discussed.  He was informed that current evidence does not provide any definitive answers regarding the risks of testosterone therapy on prostate cancer and cardiovascular disease. The need for periodic monitoring of his testosterone level, PSA, hematocrit and DRE was discussed.   Abbie Sons, [place] [address], [place], [place] [address] [phone]

## 2021-04-21 ENCOUNTER — Other Ambulatory Visit: Payer: Self-pay

## 2021-04-21 ENCOUNTER — Ambulatory Visit: Payer: 59 | Admitting: Urology

## 2021-04-21 ENCOUNTER — Encounter: Payer: Self-pay | Admitting: Urology

## 2021-04-21 VITALS — BP 148/81 | HR 51 | Ht 68.0 in | Wt 210.0 lb

## 2021-04-21 DIAGNOSIS — E291 Testicular hypofunction: Secondary | ICD-10-CM | POA: Diagnosis not present

## 2021-04-21 DIAGNOSIS — N529 Male erectile dysfunction, unspecified: Secondary | ICD-10-CM

## 2021-04-21 MED ORDER — TESTOSTERONE CYPIONATE 200 MG/ML IM SOLN: 200.0000 mg | INTRAMUSCULAR | 0 refills | Status: DC

## 2021-04-22 LAB — LUTEINIZING HORMONE: LH: 3.1 m[IU]/mL (ref 1.7–8.6)

## 2021-04-28 ENCOUNTER — Other Ambulatory Visit: Payer: Self-pay

## 2021-04-28 ENCOUNTER — Ambulatory Visit: Payer: 59

## 2021-04-28 ENCOUNTER — Ambulatory Visit: Payer: Self-pay | Admitting: Family Medicine

## 2021-04-28 DIAGNOSIS — Q667 Congenital pes cavus, unspecified foot: Secondary | ICD-10-CM

## 2021-04-28 NOTE — Progress Notes (Signed)
Patient presents for orthotic pick up.  Verbal and written break in and wear instructions given.  Patient will follow up in 4 weeks with Dr if symptoms worsen or fail to improve. 

## 2021-04-28 NOTE — Patient Instructions (Signed)

## 2021-05-01 NOTE — Progress Notes (Signed)
RAYMUND MANRIQUE is a 60 y.o.  male with testosterone deficiency who presents today for instruction on delivering an IM injection of testosterone cypionate with his daughter, Janett Billow.    I instructed the patient to identify the concentration of his testosterone. Testosterone for injection is usually in the form of testosterone cypionate. These liquids come in multiple concentrations, so before giving an injection, it's very important to make sure that his intended dosage takes into account the concentration of the testosterone serum. Usually, testosterone comes in a concentration of either 100 mg/ml or 200 mg/ml.  We typically use the 200 mg/mL in this office.    Using a sterile, 18 G needle and 3 cc syringe, the testosterone cypionate was drawn up for a 1 cc injection.  To draw up the dose, I demonstrated how to first draw air into the syringe equal to the volume of the dosage. Then, wipe the top of the medication bottle with an alcohol wipe, insert the needle through the lid and into the medication, and push the air from your syringe into the bottle. Turn the bottle upside down and draw out the exact dosage of testosterone.  I demonstrated how to aspirate the syringe by hinge the syringe with its needle uncapped and pointing up in front of him.  Looking for air bubbles in the syringe. Flick the side of the syringe to get these bubbles to rise to the top.    When the dosage is bubble-free, I slowly depressed the plunger to force the air at the top of the syringe out stopping when a tiny drop of medication comes out of the tip of the syringe.  I advised him to be certain no air remained in the syringe as injecting air is very dangerous.  Being careful not to squirt or spray a significant portion of the dosage onto the floor.  Preparing the injection site, outer middle third of the vastus lateralis muscle of the thigh, I took a sterile alcohol pad and wipe the immediate area around where I intended him to  inject.   I then demonstrated how to change the needle from the 18 G to the 21 G needle.  I then gave him the syringe for injecting.  He correctly identified the injection location.  He held the syringe like a dart at a 90-degree angle above the sterile injection site. Quickly plunged it into the flesh. Before depressing the plunger, he drew back on it slightly and no blood was seen.  I advised him that if blood flashed in the syringe, he would need to remove the needle and then select a different location as he was in the vein.  Inject the medication at a steady, controlled pace.  He fully depressed the plunger, slowly pull the needle out. Pressing around the injection site with a sterile cotton swab as he did so - this preventing the emerging needle from pulling on the skin and causing extra pain.  We assessed the needle entry point for bleeding, and applied a sterile Band-Aid and/or cotton swab if needed. Disposed of the used needle and syringe in a proper sharps container.  I advised him to acquire a sharps container for his personal use at home.  I advised him that If, after injection, he experienced redness, swelling, or discomfort beyond that of normal soreness at the site of injection, call our office for an appointment and instructions.  He is to always store his medication at the recommended temperature, and always check  the expiration date on the bottle. If it's expired, don't use it.  Of course, keep all of med's out of reach of children.  Do not change his dose without consulting your provider.  His starting dose will be 1 cc every 2 weeks.  He will return 1 week after his fourth injection for a testosterone level  Donna Snooks, PA-C  I spent 10 minutes on the day of the encounter to include pre-visit record review, face-to-face time with the patient, and post-visit ordering of tests.

## 2021-05-02 ENCOUNTER — Other Ambulatory Visit: Payer: Self-pay

## 2021-05-02 ENCOUNTER — Encounter: Payer: Self-pay | Admitting: Urology

## 2021-05-02 ENCOUNTER — Ambulatory Visit (INDEPENDENT_AMBULATORY_CARE_PROVIDER_SITE_OTHER): Payer: 59 | Admitting: Urology

## 2021-05-02 VITALS — BP 148/73 | HR 54 | Ht 68.0 in | Wt 210.0 lb

## 2021-05-02 DIAGNOSIS — E349 Endocrine disorder, unspecified: Secondary | ICD-10-CM | POA: Diagnosis not present

## 2021-05-30 ENCOUNTER — Encounter: Payer: Self-pay | Admitting: Podiatry

## 2021-05-30 ENCOUNTER — Ambulatory Visit: Payer: 59 | Admitting: Podiatry

## 2021-05-30 ENCOUNTER — Other Ambulatory Visit: Payer: Self-pay

## 2021-05-30 DIAGNOSIS — E0843 Diabetes mellitus due to underlying condition with diabetic autonomic (poly)neuropathy: Secondary | ICD-10-CM | POA: Diagnosis not present

## 2021-05-30 DIAGNOSIS — M79671 Pain in right foot: Secondary | ICD-10-CM

## 2021-05-30 NOTE — Progress Notes (Signed)
   Subjective:  60 y.o. male with PMHx of diabetes mellitus presenting for routine follow-up foot care.  Patient states that he developed callus to the medial aspect of the right great toe that he tried to trim himself.  He is afraid that he cut it too deep and now he has a wound.  He also complains that his toenails have not been growing back.  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  Very strenuous on his feet in composite toed boots.  He states that at the end of the day he notices bleeding to the toenails all digits.  He presents for further treatment and evaluation   Past Medical History:  Diagnosis Date   Actinic keratosis    Cancer ([place])    SKIN    Depression    Diabetes mellitus without complication ([place])    Type II   Dyspnea    with exertion    Fatigue    History of basal cell carcinoma (BCC) 05/21/2016   right spinal upper back/superficial   Hx of dysplastic nevus 09/26/2013   right malar cheek/mild   Hyperlipidemia    Hypertension    Hypogonadism male    Neuromuscular disorder ([place])    "back nerve stimulator"   Neuropathy    PAD (peripheral artery disease) ([place])    Sleep apnea    CPAP   Tinnitus    Tuberculosis    POSITIVE  TB SKIN TEST 1992.6 MTH TX .was exposed to someone who had it.      Objective/Physical Exam General: The patient is alert and oriented x3 in no acute distress.  Dermatology:  Wound #1 noted to the medial aspect of the first MTPJ right foot measuring approximately 1.5 x 1.0 x 0.2 cm (LxWxD).   To the noted ulceration(s), there is no eschar. There is a moderate amount of slough, fibrin, and necrotic tissue noted. Granulation tissue and wound base is red. There is a minimal amount of serosanguineous drainage noted. There is no exposed bone muscle-tendon ligament or joint. There is no malodor. Periwound integrity is intact. Skin is warm, dry and supple bilateral lower extremities.  Vascular: Palpable pedal pulses  bilaterally. No edema or erythema noted. Capillary refill within normal limits.  Neurological: Epicritic and protective threshold diminished bilaterally.   Musculoskeletal Exam: Range of motion within normal limits to all pedal and ankle joints bilateral. Muscle strength 5/5 in all groups bilateral.  History of prior toe amputations right foot  Assessment: 1.  Ulcer right first MTPJ secondary to diabetes mellitus 2. diabetes mellitus w/ peripheral neuropathy   Plan of Care:  1. Patient was evaluated. 2. Continue custom orthotics 3. Patient states that lately his feet feel very cold. Today we are going to order new ABIs bilateral lower extremities.  4. Return to clinic in 3 mos for routine care    Edrick Kins, DPM [facility]  Dr. Edrick Kins, DPM    [address], [place] [address]                Office [phone]  Fax [phone]

## 2021-06-07 ENCOUNTER — Other Ambulatory Visit: Payer: Self-pay | Admitting: Family Medicine

## 2021-06-07 NOTE — Telephone Encounter (Signed)
Requested Prescriptions  Pending Prescriptions Disp Refills  . hydrochlorothiazide (HYDRODIURIL) 25 MG tablet [Pharmacy Med Name: HYDROCHLOROTHIAZIDE 25 MG TAB] 30 tablet 5    Sig: TAKE 1 TABLET BY MOUTH EVERY DAY. *INSURANCE ONLY COVERS 30 DAYS**     Cardiovascular: Diuretics - Thiazide Failed - 06/07/2021 10:05 AM      Failed - Cr in normal range and within 360 days    Creat  Date Value Ref Range Status  05/05/2017 0.91 0.70 - 1.33 mg/dL Final    Comment:    For patients >67 years of age, the reference limit for Creatinine is approximately 13% higher for people identified as African-American. .    Creatinine, Ser  Date Value Ref Range Status  03/25/2021 1.40 (H) 0.76 - 1.27 mg/dL Final         Failed - Last BP in normal range    BP Readings from Last 1 Encounters:  05/02/21 (!) 148/73         Passed - Ca in normal range and within 360 days    Calcium  Date Value Ref Range Status  03/25/2021 9.6 8.6 - 10.2 mg/dL Final         Passed - K in normal range and within 360 days    Potassium  Date Value Ref Range Status  03/25/2021 4.5 3.5 - 5.2 mmol/L Final  12/28/2012 4.1 3.5 - 5.1 mmol/L Final         Passed - Na in normal range and within 360 days    Sodium  Date Value Ref Range Status  03/25/2021 137 134 - 144 mmol/L Final         Passed - Valid encounter within last 6 months    Recent Outpatient Visits          2 months ago Type 2 diabetes mellitus with diabetic polyneuropathy, without long-term current use of insulin ([place])   [facility] Jerrol Banana., MD   4 months ago Recurrent major depressive disorder, in partial remission [facility])   [facility] Jerrol Banana., MD   7 months ago Annual physical exam   [facility] Jerrol Banana., MD   1 year ago Type 2 diabetes mellitus with complication, without long-term current use of insulin [facility])   [facility] Jerrol Banana., MD    2 years ago DDD (degenerative disc disease), cervical   [facility] Jerrol Banana., MD      Future Appointments            In 2 weeks Brendolyn Patty, MD [place]   In 1 month Jerrol Banana., MD [facility]

## 2021-06-17 ENCOUNTER — Other Ambulatory Visit: Payer: Self-pay | Admitting: Urology

## 2021-06-20 ENCOUNTER — Other Ambulatory Visit: Payer: 59

## 2021-06-20 ENCOUNTER — Other Ambulatory Visit: Payer: Self-pay

## 2021-06-20 DIAGNOSIS — E349 Endocrine disorder, unspecified: Secondary | ICD-10-CM

## 2021-06-21 LAB — TESTOSTERONE: Testosterone: 66 ng/dL — ABNORMAL LOW (ref 264–916)

## 2021-06-22 ENCOUNTER — Other Ambulatory Visit: Payer: Self-pay | Admitting: Family Medicine

## 2021-06-22 DIAGNOSIS — I10 Essential (primary) hypertension: Secondary | ICD-10-CM

## 2021-06-22 NOTE — Telephone Encounter (Signed)
Requested Prescriptions  Pending Prescriptions Disp Refills  . losartan (COZAAR) 100 MG tablet [Pharmacy Med Name: LOSARTAN POTASSIUM 100 MG TAB] 30 tablet 0    Sig: TAKE 1 TABLET BY MOUTH EVERY DAY     Cardiovascular:  Angiotensin Receptor Blockers Failed - 06/22/2021  4:00 PM      Failed - Cr in normal range and within 180 days    Creat  Date Value Ref Range Status  05/05/2017 0.91 0.70 - 1.33 mg/dL Final    Comment:    For patients >62 years of age, the reference limit for Creatinine is approximately 13% higher for people identified as African-American. .    Creatinine, Ser  Date Value Ref Range Status  03/25/2021 1.40 (H) 0.76 - 1.27 mg/dL Final         Failed - Last BP in normal range    BP Readings from Last 1 Encounters:  05/02/21 (!) 148/73         Passed - K in normal range and within 180 days    Potassium  Date Value Ref Range Status  03/25/2021 4.5 3.5 - 5.2 mmol/L Final  12/28/2012 4.1 3.5 - 5.1 mmol/L Final         Passed - Patient is not pregnant      Passed - Valid encounter within last 6 months    Recent Outpatient Visits          3 months ago Type 2 diabetes mellitus with diabetic polyneuropathy, without long-term current use of insulin ([place])   [facility] Jerrol Banana., MD   5 months ago Recurrent major depressive disorder, in partial remission [facility])   [facility] Jerrol Banana., MD   8 months ago Annual physical exam   [facility] Jerrol Banana., MD   1 year ago Type 2 diabetes mellitus with complication, without long-term current use of insulin [facility])   [facility] Jerrol Banana., MD   2 years ago DDD (degenerative disc disease), cervical   [facility] Jerrol Banana., MD      Future Appointments            In 2 days Brendolyn Patty, MD [place]   In 3 weeks Jerrol Banana., MD [facility], [facility]

## 2021-06-24 ENCOUNTER — Ambulatory Visit: Payer: 59 | Admitting: Dermatology

## 2021-06-24 ENCOUNTER — Other Ambulatory Visit: Payer: Self-pay | Admitting: *Deleted

## 2021-06-24 ENCOUNTER — Other Ambulatory Visit: Payer: Self-pay

## 2021-06-24 DIAGNOSIS — L578 Other skin changes due to chronic exposure to nonionizing radiation: Secondary | ICD-10-CM

## 2021-06-24 DIAGNOSIS — Z85828 Personal history of other malignant neoplasm of skin: Secondary | ICD-10-CM

## 2021-06-24 DIAGNOSIS — L570 Actinic keratosis: Secondary | ICD-10-CM

## 2021-06-24 NOTE — Progress Notes (Signed)
Follow-Up Visit   Subjective  Jeff [name] is a 60 y.o. male who presents for the following: Actinic Keratosis (Recheck previously treated lesions of the R 5th Finger x 1, R index finger x 1, R wrist x 2, R forearm x 1, L 3rd finger x 1, L thenar hand x 1, L wrist x 2, L 2nd webspace x 1, L index finger x 2, R upper back x 1, R nasal dorsum x 1, R upper eyebrow x 1, R nasal tip x 2. ).   The following portions of the chart were reviewed this encounter and updated as appropriate:       Review of Systems:  No other skin or systemic complaints except as noted in HPI or Assessment and Plan.  Objective  Well appearing patient in no apparent distress; mood and affect are within normal limits.  A focused examination was performed including the face, arms, hands, and back. Relevant physical exam findings are noted in the Assessment and Plan.  L zygoma x 2, R temporal hairline x 1, R hand dorsum x 1, R thumb x 2, L hand dorsum x 2 (8) Pink scaly papules Previously treated Aks are clear   Assessment & Plan  AK (actinic keratosis) (8) L zygoma x 2, R temporal hairline x 1, R hand dorsum x 1, R thumb x 2, L hand dorsum x 2  Hypertrophic AK's on the hands  Actinic keratoses are precancerous spots that appear secondary to cumulative UV radiation exposure/sun exposure over time. They are chronic with expected duration over 1 year. A portion of actinic keratoses will progress to squamous cell carcinoma of the skin. It is not possible to reliably predict which spots will progress to skin cancer and so treatment is recommended to prevent development of skin cancer.  Recommend daily broad spectrum sunscreen SPF 30+ to sun-exposed areas, reapply every 2 hours as needed.  Recommend staying in the shade or wearing long sleeves, sun glasses (UVA+UVB protection) and wide brim hats (4-inch brim around the entire circumference of the hat). Call for new or changing lesions.   Destruction of lesion - L  zygoma x 2, R temporal hairline x 1, R hand dorsum x 1, R thumb x 2, L hand dorsum x 2  Destruction method: cryotherapy   Informed consent: discussed and consent obtained   Lesion destroyed using liquid nitrogen: Yes   Region frozen until ice ball extended beyond lesion: Yes   Outcome: patient tolerated procedure well with no complications   Post-procedure details: wound care instructions given   Additional details:  Prior to procedure, discussed risks of blister formation, small wound, skin dyspigmentation, or rare scar following cryotherapy. Recommend Vaseline ointment to treated areas while healing.   Actinic Damage - chronic, secondary to cumulative UV radiation exposure/sun exposure over time - diffuse scaly erythematous macules with underlying dyspigmentation - Recommend daily broad spectrum sunscreen SPF 30+ to sun-exposed areas, reapply every 2 hours as needed.  - Recommend staying in the shade or wearing long sleeves, sun glasses (UVA+UVB protection) and wide brim hats (4-inch brim around the entire circumference of the hat). - Call for new or changing lesions.  History of Basal Cell Carcinoma of the Skin - No evidence of recurrence today - Recommend regular full body skin exams - Recommend daily broad spectrum sunscreen SPF 30+ to sun-exposed areas, reapply every 2 hours as needed.  - Call if any new or changing lesions are noted between office visits   Return  in about 6 months (around 12/22/2021) for AK f/u .  Luther Redo, CMA, am acting as scribe for Brendolyn Patty, MD .  Documentation: I have reviewed the above documentation for accuracy and completeness, and I agree with the above.  Brendolyn Patty MD

## 2021-06-24 NOTE — Patient Instructions (Signed)

## 2021-06-30 ENCOUNTER — Encounter: Payer: Self-pay | Admitting: *Deleted

## 2021-07-09 ENCOUNTER — Other Ambulatory Visit: Payer: Self-pay | Admitting: *Deleted

## 2021-07-09 DIAGNOSIS — E349 Endocrine disorder, unspecified: Secondary | ICD-10-CM

## 2021-07-10 ENCOUNTER — Telehealth: Payer: Self-pay

## 2021-07-10 ENCOUNTER — Other Ambulatory Visit: Payer: 59

## 2021-07-10 ENCOUNTER — Other Ambulatory Visit: Payer: Self-pay

## 2021-07-10 DIAGNOSIS — E349 Endocrine disorder, unspecified: Secondary | ICD-10-CM

## 2021-07-10 NOTE — Telephone Encounter (Signed)
Erroneous error

## 2021-07-11 ENCOUNTER — Other Ambulatory Visit: Payer: Self-pay | Admitting: *Deleted

## 2021-07-11 DIAGNOSIS — N529 Male erectile dysfunction, unspecified: Secondary | ICD-10-CM

## 2021-07-11 DIAGNOSIS — E291 Testicular hypofunction: Secondary | ICD-10-CM

## 2021-07-11 DIAGNOSIS — E349 Endocrine disorder, unspecified: Secondary | ICD-10-CM

## 2021-07-11 LAB — TESTOSTERONE: Testosterone: 537 ng/dL (ref 264–916)

## 2021-07-14 MED ORDER — TESTOSTERONE CYPIONATE 200 MG/ML IM SOLN: INTRAMUSCULAR | 1 refills | Status: DC

## 2021-07-15 ENCOUNTER — Ambulatory Visit: Payer: 59 | Admitting: Family Medicine

## 2021-08-01 ENCOUNTER — Other Ambulatory Visit: Payer: Self-pay | Admitting: Family Medicine

## 2021-08-01 DIAGNOSIS — I10 Essential (primary) hypertension: Secondary | ICD-10-CM

## 2021-08-20 ENCOUNTER — Other Ambulatory Visit: Payer: Self-pay

## 2021-08-20 ENCOUNTER — Other Ambulatory Visit: Payer: Self-pay | Admitting: Podiatry

## 2021-08-20 ENCOUNTER — Ambulatory Visit (INDEPENDENT_AMBULATORY_CARE_PROVIDER_SITE_OTHER): Payer: 59

## 2021-08-20 DIAGNOSIS — R209 Unspecified disturbances of skin sensation: Secondary | ICD-10-CM

## 2021-08-20 DIAGNOSIS — E0843 Diabetes mellitus due to underlying condition with diabetic autonomic (poly)neuropathy: Secondary | ICD-10-CM

## 2021-08-20 DIAGNOSIS — E11621 Type 2 diabetes mellitus with foot ulcer: Secondary | ICD-10-CM

## 2021-08-20 DIAGNOSIS — L97519 Non-pressure chronic ulcer of other part of right foot with unspecified severity: Secondary | ICD-10-CM | POA: Diagnosis not present

## 2021-09-09 ENCOUNTER — Other Ambulatory Visit: Payer: Self-pay

## 2021-09-09 ENCOUNTER — Ambulatory Visit (INDEPENDENT_AMBULATORY_CARE_PROVIDER_SITE_OTHER): Payer: Medicare PPO | Admitting: Family Medicine

## 2021-09-09 DIAGNOSIS — E1142 Type 2 diabetes mellitus with diabetic polyneuropathy: Secondary | ICD-10-CM

## 2021-09-09 DIAGNOSIS — G629 Polyneuropathy, unspecified: Secondary | ICD-10-CM

## 2021-09-09 DIAGNOSIS — Z9889 Other specified postprocedural states: Secondary | ICD-10-CM

## 2021-09-09 DIAGNOSIS — I70209 Unspecified atherosclerosis of native arteries of extremities, unspecified extremity: Secondary | ICD-10-CM

## 2021-09-09 DIAGNOSIS — Z114 Encounter for screening for human immunodeficiency virus [HIV]: Secondary | ICD-10-CM | POA: Diagnosis not present

## 2021-09-09 DIAGNOSIS — M5417 Radiculopathy, lumbosacral region: Secondary | ICD-10-CM

## 2021-09-09 DIAGNOSIS — I10 Essential (primary) hypertension: Secondary | ICD-10-CM | POA: Diagnosis not present

## 2021-09-09 DIAGNOSIS — G894 Chronic pain syndrome: Secondary | ICD-10-CM

## 2021-09-09 DIAGNOSIS — F1129 Opioid dependence with unspecified opioid-induced disorder: Secondary | ICD-10-CM

## 2021-09-09 MED ORDER — EMPAGLIFLOZIN 25 MG PO TABS: 25.0000 mg | ORAL_TABLET | Freq: Every day | ORAL | 3 refills | Status: DC

## 2021-09-09 MED ORDER — METOPROLOL SUCCINATE ER 50 MG PO TB24: 50.0000 mg | ORAL_TABLET | Freq: Every day | ORAL | 3 refills | Status: DC

## 2021-09-09 MED ORDER — LYRICA 100 MG PO CAPS: ORAL_CAPSULE | ORAL | 0 refills | Status: DC

## 2021-09-09 MED ORDER — METFORMIN HCL 1000 MG PO TABS: 1000.0000 mg | ORAL_TABLET | Freq: Two times a day (BID) | ORAL | 3 refills | Status: DC

## 2021-09-09 MED ORDER — AMLODIPINE BESYLATE 5 MG PO TABS: 5.0000 mg | ORAL_TABLET | Freq: Every day | ORAL | 0 refills | Status: DC

## 2021-09-09 NOTE — Progress Notes (Signed)
Virtual telephone visit    Virtual Visit via Telephone Note   This visit type was conducted due to national recommendations for restrictions regarding the COVID-19 Pandemic (e.g. social distancing) in an effort to limit this patient's exposure and mitigate transmission in our community. Due to his co-morbid illnesses, this patient is at least at moderate risk for complications without adequate follow up. This format is felt to be most appropriate for this patient at this time. The patient did not have access to video technology or had technical difficulties with video requiring transitioning to audio format only (telephone). Physical exam was limited to content and character of the telephone converstion.    Patient location: home Provider location: home  I discussed the limitations of evaluation and management by telemedicine and the availability of in person appointments. The patient expressed understanding and agreed to proceed.   Visit Date: 09/09/2021  Today's healthcare provider: Myles Gip, DO   Chief Complaint  Patient presents with   Follow-up   Subjective    HPI HPI   Patient following up on T2DM and HTN. Need a refill on Lyrica Last edited by Doristine Devoid, CMA on 09/09/2021  8:44 AM.      Hypertension: - Medications: HCTZ, losartan, metoprolol, ASA - Compliance: good - Checking BP at home: yes, 150-160s SBP when in pain. Occasionally in 170s SBP - Denies any SOB, CP, LE edema, medication SEs, or symptoms of hypotension  Diabetes, Type 2 - Last A1c 7.4 03/2021 - Medications: jardiance, metformin - Compliance: good - Checking BG at home: sometimes, unsure - Eye exam: no, sees [facility] - Foot exam: UTD - Statin: yes - PNA vaccine: due - Denies symptoms of hypoglycemia - with diabetic neuropathy, follows with podiatry  Lumbar radiculopathy s/p laminectomy, DDD, chronic pain - oxycodone, lyrica, baclofen, robaxin. With spinal cord stimulator.  Follows with [facility] for procedural management. Looking for different pain management to manage chronic narcotics.   Low testosterone - follows with Urology. Getting injections.  Medications: Outpatient Medications Prior to Visit  Medication Sig   ARIPiprazole (ABILIFY) 5 MG tablet TAKE 1 TABLET BY MOUTH EVERY DAY   aspirin 81 MG tablet Take 81 mg by mouth daily.   baclofen (LIORESAL) 10 MG tablet    gentamicin cream (GARAMYCIN) 0.1 % Apply 1 application topically 2 (two) times daily. (Patient not taking: Reported on 06/24/2021)   hydrochlorothiazide (HYDRODIURIL) 25 MG tablet TAKE 1 TABLET BY MOUTH EVERY DAY. *INSURANCE ONLY COVERS 30 DAYS**   hydrocortisone 2.5 % cream Apply topically.   ibuprofen (ADVIL) 400 MG tablet Take 400 mg by mouth 3 (three) times daily as needed.   losartan (COZAAR) 100 MG tablet TAKE 1 TABLET BY MOUTH EVERY DAY   LYRICA 100 MG capsule LIMIT 1 CAPSULE BY MOUTH 3 - 5 TIMES PER DAY IF TOLERATED (NOTE THAT CAPSULE IS NOW 100 MG SIZE)   methocarbamol (ROBAXIN) 500 MG tablet SMARTSIG:0.5-1 Tablet(s) By Mouth 2-3 Times Daily   mometasone (ELOCON) 0.1 % cream Apply 1 application topically daily.   ONETOUCH VERIO test strip CHECK SUGAR ONCE DAILY DX E11.9   oxyCODONE (ROXICODONE) 15 MG immediate release tablet SMARTSIG:1 Tablet(s) By Mouth 4-6 Times Daily   rosuvastatin (CRESTOR) 40 MG tablet Take 1 tablet (40 mg total) by mouth daily.   sildenafil (REVATIO) 20 MG tablet TAKE 1 TO 5 TABLETS BY MOUTH DAILY AS NEEDED   testosterone cypionate (DEPOTESTOSTERONE CYPIONATE) 200 MG/ML injection INJECT 1 ML (200 MG  TOTAL) INTO THE MUSCLE EVERY 14 DAYS   venlafaxine (EFFEXOR) 75 MG tablet TAKE 3 TABLETS (225 MG TOTAL) BY MOUTH DAILY.   [DISCONTINUED] JARDIANCE 25 MG TABS tablet TAKE 1 TABLET BY MOUTH EVERY DAY   [DISCONTINUED] metFORMIN (GLUCOPHAGE) 1000 MG tablet TAKE 1 TABLET BY MOUTH TWICE A DAY WITH MEALS   [DISCONTINUED] metoprolol succinate (TOPROL-XL) 50 MG  24 hr tablet TAKE 1 TABLET BY MOUTH EVERY DAY   No facility-administered medications prior to visit.       Objective    There were no vitals taken for this visit.     Assessment & Plan     Problem List Items Addressed This Visit       Cardiovascular and Mediastinum   Atherosclerotic peripheral vascular disease ([place])    Recent ABIs normal. Continue ASA 81      Relevant Medications   amLODipine (NORVASC) 5 MG tablet   metoprolol succinate (TOPROL-XL) 50 MG 24 hr tablet   Essential (primary) hypertension    Uncontrolled. Add amlodipine, f/u in 1 month for recheck. Obtaining labs today.      Relevant Medications   amLODipine (NORVASC) 5 MG tablet   metoprolol succinate (TOPROL-XL) 50 MG 24 hr tablet     Endocrine   Diabetes mellitus, type 2 (HCC) - Primary    Recheck a1c and adjust as indicated. Refills provided.      Relevant Medications   empagliflozin (JARDIANCE) 25 MG TABS tablet   metFORMIN (GLUCOPHAGE) 1000 MG tablet   Other Relevant Orders   Basic Metabolic Panel (BMET)   Hemoglobin A1c     Nervous and Auditory   L-S radiculopathy   Relevant Medications   LYRICA 100 MG capsule   Neuropathy   Relevant Medications   LYRICA 100 MG capsule   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HCC)     Other   Chronic pain syndrome   Relevant Medications   LYRICA 100 MG capsule   History of lumbar laminectomy   Other Visit Diagnoses     Screening for HIV (human immunodeficiency virus)       Relevant Orders   HIV antibody (with reflex)          I discussed the assessment and treatment plan with the patient. The patient was provided an opportunity to ask questions and all were answered. The patient agreed with the plan and demonstrated an understanding of the instructions.   The patient was advised to call back or seek an in-person evaluation if the symptoms worsen or if the condition fails to improve as anticipated.  I provided 16 minutes of  non-face-to-face time during this encounter.   Myles Gip, [place] [phone] (phone) [phone] (fax)  [place]

## 2021-09-09 NOTE — Assessment & Plan Note (Signed)
Recheck a1c and adjust as indicated. Refills provided.

## 2021-09-09 NOTE — Assessment & Plan Note (Signed)
Recent ABIs normal. Continue ASA 81

## 2021-09-09 NOTE — Assessment & Plan Note (Signed)
Uncontrolled. Add amlodipine, f/u in 1 month for recheck. Obtaining labs today.

## 2021-09-10 LAB — BASIC METABOLIC PANEL
BUN/Creatinine Ratio: 15 (ref 10–24)
BUN: 22 mg/dL (ref 8–27)
CO2: 20 mmol/L (ref 20–29)
Calcium: 9.3 mg/dL (ref 8.6–10.2)
Chloride: 103 mmol/L (ref 96–106)
Creatinine, Ser: 1.43 mg/dL — ABNORMAL HIGH (ref 0.76–1.27)
Glucose: 150 mg/dL — ABNORMAL HIGH (ref 70–99)
Potassium: 6.3 mmol/L — ABNORMAL HIGH (ref 3.5–5.2)
Sodium: 147 mmol/L — ABNORMAL HIGH (ref 134–144)
eGFR: 55 mL/min/{1.73_m2} — ABNORMAL LOW (ref >59)

## 2021-09-10 LAB — HEMOGLOBIN A1C
Est. average glucose Bld gHb Est-mCnc: 163 mg/dL
Hgb A1c MFr Bld: 7.3 % — ABNORMAL HIGH (ref 4.8–5.6)

## 2021-09-10 LAB — HIV ANTIBODY (ROUTINE TESTING W REFLEX): HIV Screen 4th Generation wRfx: NONREACTIVE

## 2021-09-17 ENCOUNTER — Other Ambulatory Visit: Payer: Self-pay

## 2021-09-17 MED ORDER — PREGABALIN 100 MG PO CAPS: ORAL_CAPSULE | ORAL | 0 refills | Status: DC

## 2021-09-25 ENCOUNTER — Encounter: Payer: Self-pay | Admitting: Urology

## 2021-09-25 ENCOUNTER — Other Ambulatory Visit: Payer: 59

## 2021-09-26 ENCOUNTER — Other Ambulatory Visit: Payer: Self-pay | Admitting: Urology

## 2021-09-26 ENCOUNTER — Ambulatory Visit (INDEPENDENT_AMBULATORY_CARE_PROVIDER_SITE_OTHER): Payer: 59 | Admitting: Podiatry

## 2021-09-26 ENCOUNTER — Encounter: Payer: Self-pay | Admitting: Podiatry

## 2021-09-26 ENCOUNTER — Other Ambulatory Visit: Payer: Self-pay

## 2021-09-26 DIAGNOSIS — E0843 Diabetes mellitus due to underlying condition with diabetic autonomic (poly)neuropathy: Secondary | ICD-10-CM

## 2021-09-26 DIAGNOSIS — L97512 Non-pressure chronic ulcer of other part of right foot with fat layer exposed: Secondary | ICD-10-CM

## 2021-09-26 NOTE — Progress Notes (Signed)
° °  Subjective:  61 y.o. male with PMHx of diabetes mellitus presenting for routine follow-up foot care.  Patient states that he has now developed new wounds to the right great toe.  He was last seen in the office on 05/30/2021.  Last A1c 09/09/2021 was 7.3.  He presents for further treatment and evaluation  Past Medical History:  Diagnosis Date   Actinic keratosis    Cancer ([place])    SKIN    Depression    Diabetes mellitus without complication ([place])    Type II   Dyspnea    with exertion    Fatigue    History of basal cell carcinoma (BCC) 05/21/2016   right spinal upper back/superficial   Hx of dysplastic nevus 09/26/2013   right malar cheek/mild   Hyperlipidemia    Hypertension    Hypogonadism male    Neuromuscular disorder ([place])    "back nerve stimulator"   Neuropathy    PAD (peripheral artery disease) ([place])    Sleep apnea    CPAP   Tinnitus    Tuberculosis    POSITIVE  TB SKIN TEST 1992.6 MTH [place] .was exposed to someone who had it.         Objective/Physical Exam General: The patient is alert and oriented x3 in no acute distress.  Dermatology:  The most concerning ulcer today has developed to the distal tip of the right great toe which measures about 1.5 x 1.5 x 0.3 cm.  There is no undermining.  There is no exposed bone muscle tendon ligament or joint.  Wound base is granular.  Periwound is slightly erythematous.  Vascular: VAS [place] ABI W/WO TBI 08/20/2021 Summary:  Bilateral: Bilateral ankle-brachial indexes are within normal range. No  evidence of significant lower extremity arterial disease. Bilateral  toe-brachial indexes are within normal range.      Neurological: Epicritic and protective threshold diminished bilaterally.   Musculoskeletal Exam: Range of motion within normal limits to all pedal and ankle joints bilateral. Muscle strength 5/5 in all groups bilateral.  History of prior toe amputations fourth and fifth digit right foot  Assessment: 1.  Ulcer right  great toe secondary to diabetes mellitus 2. diabetes mellitus w/ peripheral neuropathy   Plan of Care:  1. Patient was evaluated. 2.  Postsurgical shoes dispensed.  I explained to the patient that I do not want any close toed shoes over the next few weeks to see if we can get the toe wounds to heal 3.  Advised against picking at the toes and nails.  Patient states that he picks at his nails daily 4.  Prescription for gentamicin cream apply 2 times daily to all toes 5.  Prescription for doxycycline 100 mg 2 times daily #20 6.  Return to clinic in 3 weeks    Edrick Kins, DPM [facility]  Dr. Edrick Kins, DPM    [address], [place] [address]                Office [phone]  Fax [phone]

## 2021-09-29 NOTE — Progress Notes (Signed)
09/30/2021 10:47 AM   Jeff [name] [birth year] [id number]  Referring provider: Jerrol Banana., MD [address] [place],  [place] [address]  Chief Complaint  Patient presents with   Benign Prostatic Hypertrophy   Erectile Dysfunction   Urological history: 1. Testosterone deficiency -contributing factors of age and diabetes -testosterone level pending -H & H level pending -managed with testosterone cypionate 200 mg/mL, 1 cc every 14 days   2. ED -contributing factors of age, testosterone deficiency, HTN, HLD, PAD, DDD, previous smoker, diabetes, sleep apnea and BP meds -SHIM 7 -managed with sildenafil 20 mg, on-demand-dosing  3. BPH with LU TS -PSA pending  -I PSS 12/2   HPI: Jeff [name] is a 61 y.o. male who presents for a three month follow up.   He states the injections are going well.  He denies any bruising or pain with the injections.  He has noticed no clinical difference.  He last ejection was Friday.    Patient denies any modifying or aggravating factors.  Patient denies any gross hematuria, dysuria or suprapubic/flank pain.  Patient denies any fevers, chills, nausea or vomiting.    IPSS     Row Name 09/30/21 0900         International Prostate Symptom Score   How often have you had the sensation of not emptying your bladder? Almost always     How often have you had to urinate less than every two hours? Less than half the time     How often have you found you stopped and started again several times when you urinated? Less than 1 in 5 times     How often have you found it difficult to postpone urination? Less than half the time     How often have you had a weak urinary stream? Not at All     How often have you had to strain to start urination? Not at All     How many times did you typically get up at night to urinate? 2 Times     Total IPSS Score 12       Quality of Life due to urinary symptoms   If you were to spend the rest  of your life with your urinary condition just the way it is now how would you feel about that? Mostly Satisfied              Score:  1-7 Mild 8-19 Moderate 20-35 Severe    SHIM     Row Name 09/30/21 0956         SHIM: Over the last 6 months:   How do you rate your confidence that you could get and keep an erection? Very Low     When you had erections with sexual stimulation, how often were your erections hard enough for penetration (entering your partner)? A Few Times (much less than half the time)     During sexual intercourse, how often were you able to maintain your erection after you had penetrated (entered) your partner? Almost Never or Never     During sexual intercourse, how difficult was it to maintain your erection to completion of intercourse? Extremely Difficult     When you attempted sexual intercourse, how often was it satisfactory for you? A Few Times (much less than half the time)       SHIM Total Score   SHIM 7  Score: 1-7 Severe ED 8-11 Moderate ED 12-16 Mild-Moderate ED 17-21 Mild ED 22-25 No ED      PMH: Past Medical History:  Diagnosis Date   Actinic keratosis    Cancer ([place])    SKIN    Depression    Diabetes mellitus without complication ([place])    Type II   Dyspnea    with exertion    Fatigue    History of basal cell carcinoma (BCC) 05/21/2016   right spinal upper back/superficial   Hx of dysplastic nevus 09/26/2013   right malar cheek/mild   Hyperlipidemia    Hypertension    Hypogonadism male    Neuromuscular disorder ([place])    "back nerve stimulator"   Neuropathy    PAD (peripheral artery disease) ([facility])    Sleep apnea    CPAP   Tinnitus    Tuberculosis    POSITIVE  TB SKIN TEST 1992.6 MTH [place] .was exposed to someone who had it.    Surgical History: Past Surgical History:  Procedure Laterality Date   AMPUTATION TOE Right 03/04/2018   Procedure: AMPUTATION TOE/MPJ JOINT FIFTH RIGHT;  Surgeon: Edrick Kins, DPM;   Location: [place];  Service: Podiatry;  Laterality: Right;   BACK SURGERY  2012   neck was 2012,back same year. plate in V3-7.[id number]   CARDIAC CATHETERIZATION  01/31/2013   Medical management   CARPAL TUNNEL RELEASE Bilateral    CATARACT EXTRACTION Right 2014   CATARACT EXTRACTION W/PHACO Left 03/12/2016   Procedure: CATARACT EXTRACTION PHACO AND INTRAOCULAR LENS PLACEMENT ([place]);  Surgeon: Birder Robson, MD;  Location: [facility];  Service: Ophthalmology;  Laterality: Left;  [place] 00:31AP% 17.9CDE 5.67Fluid pack lot # [id number]   CERVICAL FUSION  2012   CORONARY ANGIOPLASTY  2014   all good   KNEE ARTHROSCOPY Left    LEFT HEART CATHETERIZATION WITH CORONARY ANGIOGRAM N/A 01/31/2013   Procedure: LEFT HEART CATHETERIZATION WITH CORONARY ANGIOGRAM;  Surgeon: Minus Breeding, MD;  Location: [facility] CATH LAB;  Service: Cardiovascular;  Laterality: N/A;   LUMBAR LAMINECTOMY/DECOMPRESSION MICRODISCECTOMY  06/22/2011   Procedure: LUMBAR LAMINECTOMY/DECOMPRESSION MICRODISCECTOMY;  Surgeon: Ophelia Charter;  Location: [facility];  Service: Neurosurgery;  Laterality: N/A;  Thoracic Ten-Eleven,Thoracic Eleven-Twelve Laminectomy   Pain stimulator     ulnar N/A    VASECTOMY  1991    Home Medications:  Allergies as of 09/30/2021       Reactions   Topamax [topiramate] Other (See Comments)   unresponsive  Episodes ? SYNCOPE ?   Keppra [levetiracetam] Palpitations, Other (See Comments)   "jittery", and "loopy." per pt.   Neurontin [gabapentin] Other (See Comments)   TREMORS "jittery" and "loopy" per pt.   Cymbalta [duloxetine Hcl] Other (See Comments)   "jittery" and "loopy" per pt        Medication List        Accurate as of September 30, 2021 10:47 AM. If you have any questions, ask your nurse or doctor.          2-3CC SYRINGE 3 ML Misc 1 mg by Does not apply route every 14 (fourteen) days. Started by: Zara Council, PA-C   amLODipine 5 MG tablet Commonly known as: NORVASC Take 1  tablet (5 mg total) by mouth daily.   ARIPiprazole 5 MG tablet Commonly known as: ABILIFY TAKE 1 TABLET BY MOUTH EVERY DAY   aspirin 81 MG tablet Take 81 mg by mouth daily.   baclofen 10 MG tablet Commonly known as: LIORESAL  BD Disp Needles 18G X 1-1/2" Misc Generic drug: NEEDLE (DISP) 18 G 1 mg by Does not apply route every 14 (fourteen) days. Started by: Zara Council, PA-C   BD Disp Needles 21G X 1-1/2" Misc Generic drug: NEEDLE (DISP) 21 G 1 mg by Does not apply route every 14 (fourteen) days. Started by: Zara Council, PA-C   empagliflozin 25 MG Tabs tablet Commonly known as: Jardiance Take 1 tablet (25 mg total) by mouth daily.   gentamicin cream 0.1 % Commonly known as: GARAMYCIN Apply 1 application topically 2 (two) times daily.   hydrochlorothiazide 25 MG tablet Commonly known as: HYDRODIURIL TAKE 1 TABLET BY MOUTH EVERY DAY. *INSURANCE ONLY COVERS 30 DAYS**   hydrocortisone 2.5 % cream Apply topically.   ibuprofen 400 MG tablet Commonly known as: ADVIL Take 400 mg by mouth 3 (three) times daily as needed.   losartan 100 MG tablet Commonly known as: COZAAR TAKE 1 TABLET BY MOUTH EVERY DAY   metFORMIN 1000 MG tablet Commonly known as: GLUCOPHAGE Take 1 tablet (1,000 mg total) by mouth 2 (two) times daily with a meal.   methocarbamol 500 MG tablet Commonly known as: ROBAXIN SMARTSIG:0.5-1 Tablet(s) By Mouth 2-3 Times Daily   metoprolol succinate 50 MG 24 hr tablet Commonly known as: TOPROL-XL Take 1 tablet (50 mg total) by mouth daily. Take with or immediately following a meal.   mometasone 0.1 % cream Commonly known as: Elocon Apply 1 application topically daily.   OneTouch Verio test strip Generic drug: glucose blood CHECK SUGAR ONCE DAILY DX E11.9   oxyCODONE 15 MG immediate release tablet Commonly known as: ROXICODONE   pregabalin 100 MG capsule Commonly known as: Lyrica LIMIT 1 CAPSULE BY MOUTH 3 - 5 TIMES PER DAY IF TOLERATED    rosuvastatin 40 MG tablet Commonly known as: CRESTOR Take 1 tablet (40 mg total) by mouth daily.   sildenafil 20 MG tablet Commonly known as: REVATIO TAKE 1 TO 5 TABLETS BY MOUTH DAILY AS NEEDED   testosterone cypionate 200 MG/ML injection Commonly known as: DEPOTESTOSTERONE CYPIONATE Inject 1 cc every 14 days What changed: See the new instructions. Changed by: Zara Council, PA-C   venlafaxine 75 MG tablet Commonly known as: EFFEXOR TAKE 3 TABLETS (225 MG TOTAL) BY MOUTH DAILY.        Allergies:  Allergies  Allergen Reactions   Topamax [Topiramate] Other (See Comments)    unresponsive  Episodes ? SYNCOPE ?   Keppra [Levetiracetam] Palpitations and Other (See Comments)    "jittery", and "loopy." per pt.   Neurontin [Gabapentin] Other (See Comments)    TREMORS "jittery" and "loopy" per pt.   Cymbalta [Duloxetine Hcl] Other (See Comments)    "jittery" and "loopy" per pt    Family History: Family History  Problem Relation Age of Onset   Coronary artery disease Father        PPM in his late 56s, CAD Dx 32s   Hyperlipidemia Father    Hypertension Father    Heart disease Father        CABG at age 23   Diabetes Father    Atrial fibrillation Mother    Hypertension Mother    Transient ischemic attack Mother    Dementia Mother    Migraines Daughter    Cancer Maternal Uncle        Throat   Cancer Maternal Grandmother        Lung cancer    Social History:  reports that he quit smoking  about 13 years ago. His smoking use included cigarettes. He has a 45.00 pack-year smoking history. He quit smokeless tobacco use about 25 years ago. He reports that he does not drink alcohol and does not use drugs.  ROS: Pertinent ROS in HPI  Physical Exam: BP (!) 157/88    Pulse 63    Ht 5\' 8"  (1.727 m)    Wt 211 lb (95.7 kg)    BMI 32.08 kg/m   Constitutional:  Well nourished. Alert and oriented, No acute distress. HEENT: [place] AT, mask in place.  Trachea  midline Cardiovascular: No clubbing, cyanosis, or edema. Respiratory: Normal respiratory effort, no increased work of breathing. GU: No CVA tenderness.  No bladder fullness or masses.  Patient with circumcised phallus.  Urethral meatus is patent.  No penile discharge. No penile lesions or rashes. Scrotum without lesions, cysts, rashes and/or edema.  Testicles are located scrotally bilaterally. No masses are appreciated in the testicles. Left and right epididymis are normal. Rectal: Patient with  normal sphincter tone. Anus and perineum without scarring or rashes. No rectal masses are appreciated. Prostate is approximately 50 grams, irregular, no nodules are appreciated. Seminal vesicles could not be palpated.   Neurologic: Grossly intact, no focal deficits, moving all 4 extremities. Psychiatric: Normal mood and affect.  Laboratory Data: Lab Results  Component Value Date   CREATININE 1.43 (H) 09/09/2021    Lab Results  Component Value Date   TESTOSTERONE 537 07/10/2021    Lab Results  Component Value Date   HGBA1C 7.3 (H) 09/09/2021    Lab Results  Component Value Date   ALT 15 03/25/2021  I have reviewed the labs.    Pertinent Imaging: N/A   Assessment & Plan:    1. Testosterone deficiency -testosterone level pending -H & H pending -continue testosterone cypionate 200 mg/mL, 1 cc every 14 days-refills given  2. ED -continue sildenafil 20 mg, on-demand-dosing-refill given   3. BPH with LUTS -PSA pending -DRE benign -continue conservative management, avoiding bladder irritants and timed voiding's    Return for pending labs .  These notes generated with voice recognition software. I apologize for typographical errors.  Zara Council, PA-C  [facility] [address]  [place],  [address] [phone]

## 2021-09-30 ENCOUNTER — Telehealth: Payer: Self-pay

## 2021-09-30 ENCOUNTER — Encounter: Payer: Self-pay | Admitting: Urology

## 2021-09-30 ENCOUNTER — Ambulatory Visit (INDEPENDENT_AMBULATORY_CARE_PROVIDER_SITE_OTHER): Payer: Medicare PPO | Admitting: Urology

## 2021-09-30 ENCOUNTER — Other Ambulatory Visit: Payer: Self-pay

## 2021-09-30 VITALS — BP 157/88 | HR 63 | Ht 68.0 in | Wt 211.0 lb

## 2021-09-30 DIAGNOSIS — N529 Male erectile dysfunction, unspecified: Secondary | ICD-10-CM | POA: Diagnosis not present

## 2021-09-30 DIAGNOSIS — E349 Endocrine disorder, unspecified: Secondary | ICD-10-CM | POA: Diagnosis not present

## 2021-09-30 DIAGNOSIS — E291 Testicular hypofunction: Secondary | ICD-10-CM

## 2021-09-30 DIAGNOSIS — N401 Enlarged prostate with lower urinary tract symptoms: Secondary | ICD-10-CM | POA: Diagnosis not present

## 2021-09-30 DIAGNOSIS — N138 Other obstructive and reflux uropathy: Secondary | ICD-10-CM

## 2021-09-30 MED ORDER — DOXYCYCLINE HYCLATE 100 MG PO TABS: 100.0000 mg | ORAL_TABLET | Freq: Two times a day (BID) | ORAL | 0 refills | Status: DC

## 2021-09-30 MED ORDER — SILDENAFIL CITRATE 20 MG PO TABS: ORAL_TABLET | ORAL | 10 refills | Status: AC

## 2021-09-30 MED ORDER — "BD DISP NEEDLES 18G X 1-1/2"" MISC": 1.0000 mg | 0 refills | Status: DC

## 2021-09-30 MED ORDER — GENTAMICIN SULFATE 0.1 % EX CREA: 1.0000 "application " | TOPICAL_CREAM | Freq: Three times a day (TID) | CUTANEOUS | 1 refills | Status: AC

## 2021-09-30 MED ORDER — SYRINGE 2-3 ML 3 ML MISC: 1.0000 mg | 3 refills | Status: DC

## 2021-09-30 MED ORDER — TESTOSTERONE CYPIONATE 200 MG/ML IM SOLN: INTRAMUSCULAR | 0 refills | Status: DC

## 2021-09-30 MED ORDER — "BD DISP NEEDLES 21G X 1-1/2"" MISC": 1.0000 mg | 0 refills | Status: DC

## 2021-09-30 NOTE — Telephone Encounter (Signed)
Patient called stated that the antibiotic cream and pills were not sent to pharmacy last week and he request them to be sent in.  Per Dr. Amalia Hailey note, Doxycycline and  Gentamycin cream has been sent to pharmacy  Patient has been informed of medication

## 2021-10-01 LAB — HEMOGLOBIN AND HEMATOCRIT, BLOOD
Hematocrit: 56 % — ABNORMAL HIGH (ref 37.5–51.0)
Hemoglobin: 18.8 g/dL — ABNORMAL HIGH (ref 13.0–17.7)

## 2021-10-01 LAB — TESTOSTERONE: Testosterone: 1228 ng/dL — ABNORMAL HIGH (ref 264–916)

## 2021-10-01 LAB — PSA: Prostate Specific Ag, Serum: 1.9 ng/mL (ref 0.0–4.0)

## 2021-10-02 ENCOUNTER — Other Ambulatory Visit: Payer: Self-pay | Admitting: *Deleted

## 2021-10-02 DIAGNOSIS — E291 Testicular hypofunction: Secondary | ICD-10-CM

## 2021-10-02 DIAGNOSIS — E349 Endocrine disorder, unspecified: Secondary | ICD-10-CM

## 2021-10-06 ENCOUNTER — Other Ambulatory Visit: Payer: Self-pay | Admitting: Pain Medicine

## 2021-10-06 DIAGNOSIS — M5412 Radiculopathy, cervical region: Secondary | ICD-10-CM

## 2021-10-07 NOTE — Progress Notes (Signed)
Patient on schedule for Myelogram 3/15, called and spoke with patient on phone with pre procedure instructions given.made aware to be here @ 0830, Npo after 0400, with liquids after Mn prior to procedure. Needs driver post procedure/recovery/discharge. Stated understanding.  ?

## 2021-10-15 ENCOUNTER — Ambulatory Visit
Admission: RE | Admit: 2021-10-15 | Discharge: 2021-10-15 | Disposition: A | Discharge status: Home / Self Care | Payer: Medicare Other | Source: Ambulatory Visit | Attending: Pain Medicine | Admitting: Pain Medicine

## 2021-10-15 ENCOUNTER — Other Ambulatory Visit: Payer: Self-pay

## 2021-10-15 DIAGNOSIS — M5412 Radiculopathy, cervical region: Secondary | ICD-10-CM

## 2021-10-15 DIAGNOSIS — M4312 Spondylolisthesis, cervical region: Secondary | ICD-10-CM | POA: Diagnosis not present

## 2021-10-15 DIAGNOSIS — M50121 Cervical disc disorder at C4-C5 level with radiculopathy: Secondary | ICD-10-CM | POA: Diagnosis not present

## 2021-10-15 DIAGNOSIS — M50122 Cervical disc disorder at C5-C6 level with radiculopathy: Secondary | ICD-10-CM | POA: Diagnosis not present

## 2021-10-15 MED ORDER — IOHEXOL 300 MG/ML  SOLN
30.0000 mL | Freq: Once | INTRAMUSCULAR | Status: AC | PRN
  Administered 2021-10-15: 10 mL

## 2021-10-15 MED ORDER — LIDOCAINE HCL (PF) 1 % IJ SOLN
10.0000 mL | Freq: Once | INTRAMUSCULAR | Status: AC
  Administered 2021-10-15: 5 mL via INTRADERMAL
  Filled 2021-10-15: qty 10

## 2021-10-16 ENCOUNTER — Encounter: Payer: Self-pay | Admitting: Family Medicine

## 2021-10-16 ENCOUNTER — Ambulatory Visit (INDEPENDENT_AMBULATORY_CARE_PROVIDER_SITE_OTHER): Payer: Medicare Other | Admitting: Family Medicine

## 2021-10-16 VITALS — BP 136/80 | HR 60 | Temp 98.4°F | Resp 16 | Ht 68.0 in | Wt 203.0 lb

## 2021-10-16 DIAGNOSIS — F3341 Major depressive disorder, recurrent, in partial remission: Secondary | ICD-10-CM

## 2021-10-16 DIAGNOSIS — I10 Essential (primary) hypertension: Secondary | ICD-10-CM | POA: Diagnosis not present

## 2021-10-16 DIAGNOSIS — M5030 Other cervical disc degeneration, unspecified cervical region: Secondary | ICD-10-CM

## 2021-10-16 DIAGNOSIS — E291 Testicular hypofunction: Secondary | ICD-10-CM

## 2021-10-16 DIAGNOSIS — G894 Chronic pain syndrome: Secondary | ICD-10-CM | POA: Diagnosis not present

## 2021-10-16 DIAGNOSIS — I2510 Atherosclerotic heart disease of native coronary artery without angina pectoris: Secondary | ICD-10-CM

## 2021-10-16 DIAGNOSIS — E1142 Type 2 diabetes mellitus with diabetic polyneuropathy: Secondary | ICD-10-CM

## 2021-10-16 DIAGNOSIS — E785 Hyperlipidemia, unspecified: Secondary | ICD-10-CM

## 2021-10-16 NOTE — Progress Notes (Signed)
?  ? ? ?Established patient visit ? ?I,[name],acting as a scribe for [name], MD.,have documented all relevant documentation on the behalf of [name], MD,as directed by  [name], MD while in the presence of [name], MD. ? ? ?Patient: Jeff [name]   DOB: May 05, 1961   61 y.o. Male  MRN: [id number] ?Visit Date: 10/16/2021 ? ?Today's healthcare provider: [name], MD  ? ?No chief complaint on file. ? ?Subjective  ?  ?HPI  ?Patient comes in today for follow-up.  He has chronic pain and wishes to be referred to the local pain clinic to change doctors..  He does need refills of his Lyrica.  He has appointment with podiatry tomorrow. ?Overall everything else is about the same. ?His depression is stable.  He is taking his medications as prescribed. ?Hypertension, follow-up ? ?BP Readings from Last 3 Encounters:  ?10/15/21 136/79  ?09/30/21 (!) 157/88  ?05/02/21 (!) 148/73  ? Wt Readings from Last 3 Encounters:  ?09/30/21 211 lb (95.7 kg)  ?05/02/21 210 lb (95.3 kg)  ?04/21/21 210 lb (95.3 kg)  ?  ? ?He was last seen for hypertension 1 months ago.   ?Management since that visit includes; Uncontrolled. Added amlodipine, f/u in 1 month for recheck. Obtaining labs today. ? ?He reports good compliance with treatment. ?He is not having side effects. none ?He is following a Regular diet. ?He is exercising. ?He does not smoke. ? ?Use of agents associated with hypertension: none.  ? ?Outside blood pressures are normal. ? ?Pertinent labs: ?Lab Results  ?Component Value Date  ? CHOL 130 03/25/2021  ? HDL 29 (L) 03/25/2021  ? [place] 75 03/25/2021  ? TRIG 148 03/25/2021  ? CHOLHDL 4.5 03/25/2021  ? Lab Results  ?Component Value Date  ? NA 147 (H) 09/09/2021  ? K 6.3 (H) 09/09/2021  ? CREATININE 1.43 (H) 09/09/2021  ? EGFR 55 (L) 09/09/2021  ? GLUCOSE 150 (H) 09/09/2021  ? TSH 1.410 03/25/2021  ?  ? ?The 10-year ASCVD risk score (Arnett DK, et al., 2019) is: 23.8%   ? ?--------------------------------------------------------------------------------------------------- ? ? ?Medications: ?Outpatient Medications Prior to Visit  ?Medication Sig  ? amLODipine (NORVASC) 5 MG tablet Take 1 tablet (5 mg total) by mouth daily.  ? ARIPiprazole (ABILIFY) 5 MG tablet TAKE 1 TABLET BY MOUTH EVERY DAY  ? aspirin 81 MG tablet Take 81 mg by mouth daily.  ? baclofen (LIORESAL) 10 MG tablet   ? doxycycline (VIBRA-TABS) 100 MG tablet Take 1 tablet (100 mg total) by mouth 2 (two) times daily.  ? empagliflozin (JARDIANCE) 25 MG TABS tablet Take 1 tablet (25 mg total) by mouth daily.  ? gentamicin cream (GARAMYCIN) 0.1 % Apply 1 application topically 2 (two) times daily.  ? gentamicin cream (GARAMYCIN) 0.1 % Apply 1 application topically 3 (three) times daily.  ? hydrochlorothiazide (HYDRODIURIL) 25 MG tablet TAKE 1 TABLET BY MOUTH EVERY DAY. *INSURANCE ONLY COVERS 30 DAYS**  ? hydrocortisone 2.5 % cream Apply topically.  ? ibuprofen (ADVIL) 400 MG tablet Take 400 mg by mouth 3 (three) times daily as needed.  ? losartan (COZAAR) 100 MG tablet TAKE 1 TABLET BY MOUTH EVERY DAY  ? metFORMIN (GLUCOPHAGE) 1000 MG tablet Take 1 tablet (1,000 mg total) by mouth 2 (two) times daily with a meal.  ? methocarbamol (ROBAXIN) 500 MG tablet SMARTSIG:0.5-1 Tablet(s) By Mouth 2-3 Times Daily  ? metoprolol succinate (TOPROL-XL) 50 MG 24 hr tablet Take 1 tablet (  50 mg total) by mouth daily. Take with or immediately following a meal.  ? mometasone (ELOCON) 0.1 % cream Apply 1 application topically daily.  ? NEEDLE, DISP, 18 G (BD DISP NEEDLES) 18G X 1-1/2" MISC 1 mg by Does not apply route every 14 (fourteen) days.  ? NEEDLE, DISP, 21 G (BD DISP NEEDLES) 21G X 1-1/2" MISC 1 mg by Does not apply route every 14 (fourteen) days.  ? ONETOUCH VERIO test strip CHECK SUGAR ONCE DAILY DX E11.9  ? oxyCODONE (ROXICODONE) 15 MG immediate release tablet   ? pregabalin (LYRICA) 100 MG capsule LIMIT 1 CAPSULE BY MOUTH 3 - 5 TIMES  PER DAY IF TOLERATED  ? rosuvastatin (CRESTOR) 40 MG tablet Take 1 tablet (40 mg total) by mouth daily.  ? sildenafil (REVATIO) 20 MG tablet TAKE 1 TO 5 TABLETS BY MOUTH DAILY AS NEEDED  ? Syringe, Disposable, (2-3CC SYRINGE) 3 ML MISC 1 mg by Does not apply route every 14 (fourteen) days.  ? testosterone cypionate (DEPOTESTOSTERONE CYPIONATE) 200 MG/ML injection Inject 1 cc every 14 days  ? venlafaxine (EFFEXOR) 75 MG tablet TAKE 3 TABLETS (225 MG TOTAL) BY MOUTH DAILY.  ? ?No facility-administered medications prior to visit.  ? ? ?Review of Systems  ?Constitutional:  Negative for appetite change, chills and fever.  ?Respiratory:  Negative for chest tightness, shortness of breath and wheezing.   ?Cardiovascular:  Negative for chest pain and palpitations.  ?Gastrointestinal:  Negative for abdominal pain, nausea and vomiting.  ? ?Last hemoglobin A1c ?Lab Results  ?Component Value Date  ? HGBA1C 7.3 (H) 09/09/2021  ? ?  ?  Objective  ?  ?There were no vitals taken for this visit. ?BP Readings from Last 3 Encounters:  ?10/16/21 136/80  ?10/15/21 136/79  ?09/30/21 (!) 157/88  ? ?Wt Readings from Last 3 Encounters:  ?10/16/21 203 lb (92.1 kg)  ?09/30/21 211 lb (95.7 kg)  ?05/02/21 210 lb (95.3 kg)  ? ?  ? ?Physical Exam ?Vitals reviewed.  ?Constitutional:   ?   Appearance: He is well-developed.  ?HENT:  ?   Head: Normocephalic and atraumatic.  ?   Right Ear: External ear normal.  ?   Left Ear: External ear normal.  ?   Nose: Nose normal.  ?Eyes:  ?   General: No scleral icterus. ?   Conjunctiva/sclera: Conjunctivae normal.  ?Neck:  ?   Thyroid: No thyromegaly.  ?Cardiovascular:  ?   Rate and Rhythm: Normal rate and regular rhythm.  ?   Heart sounds: Normal heart sounds.  ?Pulmonary:  ?   Effort: Pulmonary effort is normal.  ?   Breath sounds: Normal breath sounds.  ?Abdominal:  ?   Palpations: Abdomen is soft.  ?Musculoskeletal:  ?   Right lower leg: Edema present.  ?   Left lower leg: Edema present.  ?   Comments: 1+  edema with support hose.  ?Skin: ?   General: Skin is warm and dry.  ?Neurological:  ?   Mental Status: He is alert and oriented to person, place, and time.  ?Psychiatric:     ?   Mood and Affect: Mood normal.     ?   Behavior: Behavior normal.     ?   Thought Content: Thought content normal.     ?   Judgment: Judgment normal.  ?  ? ? ?No results found for any visits on 10/16/21. ? Assessment & Plan  ?  ? ?1. Essential (primary) hypertension ?Good control on  present regimen. ?- Comprehensive Metabolic Panel (CMET) ? ?2. Type 2 diabetes mellitus with diabetic polyneuropathy, without long-term current use of insulin ([place]) ?Goal A1c less than 7 ?- Comprehensive Metabolic Panel (CMET) ? ?3. Hyperlipidemia, unspecified hyperlipidemia type ?On rosuvastatin 40 ?- Comprehensive Metabolic Panel (CMET) ? ?4. Chronic pain syndrome ?On pregabalin, refer to local pain clinic ?- Ambulatory referral to Pain Clinic ? ?5. ASCVD (arteriosclerotic cardiovascular disease) ?All risk factors treated ? ?6. DDD (degenerative disc disease), cervical ? ? ?7. Eunuchoidism ?Testosterone therapy ? ?8. Recurrent major depressive disorder, in partial remission ([place]) ?Stabilizer Abilify plus Effexor ? ? ?No follow-ups on file.  ?   ? ?I, [name], MD, have reviewed all documentation for this visit. The documentation on 10/18/21 for the exam, diagnosis, procedures, and orders are all accurate and complete. ? ? ? ?Traves Majchrzak Cranford Mon, MD  ?[facility] ?[phone] (phone) ?[phone] (fax) ? ?[facility] ?

## 2021-10-17 ENCOUNTER — Other Ambulatory Visit: Payer: Self-pay

## 2021-10-17 ENCOUNTER — Ambulatory Visit: Payer: 59

## 2021-10-17 ENCOUNTER — Ambulatory Visit (INDEPENDENT_AMBULATORY_CARE_PROVIDER_SITE_OTHER): Payer: 59 | Admitting: Podiatry

## 2021-10-17 ENCOUNTER — Encounter: Payer: Self-pay | Admitting: Podiatry

## 2021-10-17 DIAGNOSIS — E0843 Diabetes mellitus due to underlying condition with diabetic autonomic (poly)neuropathy: Secondary | ICD-10-CM

## 2021-10-17 DIAGNOSIS — L97512 Non-pressure chronic ulcer of other part of right foot with fat layer exposed: Secondary | ICD-10-CM

## 2021-10-17 LAB — COMPREHENSIVE METABOLIC PANEL
ALT: 14 IU/L (ref 0–44)
AST: 19 IU/L (ref 0–40)
Albumin/Globulin Ratio: 1.7 (ref 1.2–2.2)
Albumin: 5.3 g/dL — ABNORMAL HIGH (ref 3.8–4.8)
Alkaline Phosphatase: 78 IU/L (ref 44–121)
BUN/Creatinine Ratio: 20 (ref 10–24)
BUN: 29 mg/dL — ABNORMAL HIGH (ref 8–27)
Bilirubin Total: 0.8 mg/dL (ref 0.0–1.2)
CO2: 22 mmol/L (ref 20–29)
Calcium: 10.5 mg/dL — ABNORMAL HIGH (ref 8.6–10.2)
Chloride: 100 mmol/L (ref 96–106)
Creatinine, Ser: 1.48 mg/dL — ABNORMAL HIGH (ref 0.76–1.27)
Globulin, Total: 3.2 g/dL (ref 1.5–4.5)
Glucose: 104 mg/dL — ABNORMAL HIGH (ref 70–99)
Potassium: 4.6 mmol/L (ref 3.5–5.2)
Sodium: 141 mmol/L (ref 134–144)
Total Protein: 8.5 g/dL (ref 6.0–8.5)
eGFR: 53 mL/min/{1.73_m2} — ABNORMAL LOW (ref >59)

## 2021-10-17 NOTE — Progress Notes (Signed)
? ?  Subjective:  ?61 y.o. male with PMHx of diabetes mellitus presenting for follow-up evaluation of ulcers to the bilateral great toes, the worst being the right great toe.  Patient states that there is significant improvement.  He has not touched his toes or picked at the wounds.  He presents for further treatment and evaluation ? ?Past Medical History:  ?Diagnosis Date  ? Actinic keratosis   ? Cancer [facility])   ? SKIN   ? Depression   ? Diabetes mellitus without complication ([place])   ? Type II  ? Dyspnea   ? with exertion   ? Fatigue   ? History of basal cell carcinoma (BCC) 05/21/2016  ? right spinal upper back/superficial  ? Hx of dysplastic nevus 09/26/2013  ? right malar cheek/mild  ? Hyperlipidemia   ? Hypertension   ? Hypogonadism male   ? Neuromuscular disorder ([place])   ? "back nerve stimulator"  ? Neuropathy   ? PAD (peripheral artery disease) ([place])   ? Sleep apnea   ? CPAP  ? Tinnitus   ? Tuberculosis   ? POSITIVE  TB SKIN TEST 1992.6 MTH [place] .was exposed to someone who had it.  ? ? ?Objective/Physical Exam ?General: The patient is alert and oriented x3 in no acute distress. ? ?Dermatology:  ?The wound to the right great toe appears to be significantly improved.  There is no drainage to the area.  There is a superficial eschar noted with good healing.  The erythema around the areas has resolved. ? ?Vascular: VAS [place] ABI W/WO TBI 08/20/2021 ?Summary:  ?Bilateral: Bilateral ankle-brachial indexes are within normal range. No  ?evidence of significant lower extremity arterial disease. Bilateral  ?toe-brachial indexes are within normal range.  ?   ? ?Neurological: Epicritic and protective threshold diminished bilaterally.  ? ?Musculoskeletal Exam: Range of motion within normal limits to all pedal and ankle joints bilateral. Muscle strength 5/5 in all groups bilateral.  History of prior toe amputations fourth and fifth digit right foot ? ?Assessment: ?1.  Ulcer right great toe secondary to diabetes mellitus ?2.  diabetes mellitus w/ peripheral neuropathy ? ? ?Plan of Care:  ?1. Patient was evaluated. ?2.  Patient has completed the doxycycline 100 mg 2 times daily #20 that was prescribed and the patient states that he believes it helped significantly ?3.  Continue the gentamicin cream to the toes daily ?4.  Light debridement of the nail plate and callus with superficial eschar was performed using a tissue nipper especially to the bilateral great toes ?5.  Return to clinic 4 weeks ? ? ? ?Edrick Kins, DPM ?[place] ? ?Dr. Edrick Kins, DPM  ?  ?[address]                                      ?[place], [place] [address]                ?Office [phone]  ?Fax [phone] ? ? ? ? ?

## 2021-10-18 MED ORDER — PREGABALIN 100 MG PO CAPS: 200.0000 mg | ORAL_CAPSULE | Freq: Three times a day (TID) | ORAL | 2 refills | Status: DC

## 2021-10-24 ENCOUNTER — Telehealth: Payer: Self-pay | Admitting: *Deleted

## 2021-10-24 NOTE — Telephone Encounter (Signed)
Please clarify directions for pregabalin? ?

## 2021-11-11 ENCOUNTER — Other Ambulatory Visit: Payer: Self-pay | Admitting: Family Medicine

## 2021-11-11 DIAGNOSIS — E785 Hyperlipidemia, unspecified: Secondary | ICD-10-CM

## 2021-11-18 ENCOUNTER — Ambulatory Visit: Payer: Medicare Other | Admitting: Podiatry

## 2021-11-18 DIAGNOSIS — E0843 Diabetes mellitus due to underlying condition with diabetic autonomic (poly)neuropathy: Secondary | ICD-10-CM

## 2021-11-18 DIAGNOSIS — L97512 Non-pressure chronic ulcer of other part of right foot with fat layer exposed: Secondary | ICD-10-CM

## 2021-12-07 NOTE — Progress Notes (Addendum)
? ?  Subjective:  ?61 y.o. male with PMHx of diabetes mellitus presenting for follow-up evaluation of ulcers to the bilateral great toes, the worst being the right great toe.  Patient believes that the wounds have healed.  He no longer has any pain or tenderness to the area ? ?Past Medical History:  ?Diagnosis Date  ? Actinic keratosis   ? Cancer [facility])   ? SKIN   ? Depression   ? Diabetes mellitus without complication ([place])   ? Type II  ? Dyspnea   ? with exertion   ? Fatigue   ? History of basal cell carcinoma (BCC) 05/21/2016  ? right spinal upper back/superficial  ? Hx of dysplastic nevus 09/26/2013  ? right malar cheek/mild  ? Hyperlipidemia   ? Hypertension   ? Hypogonadism male   ? Neuromuscular disorder ([place])   ? "back nerve stimulator"  ? Neuropathy   ? PAD (peripheral artery disease) ([place])   ? Sleep apnea   ? CPAP  ? Tinnitus   ? Tuberculosis   ? POSITIVE  TB SKIN TEST 1992.6 MTH [place] .was exposed to someone who had it.  ? ? ?Objective/Physical Exam ?General: The patient is alert and oriented x3 in no acute distress. ? ?Dermatology:  ?The wound to the right great toe appears to be significantly improved and healed.  There is no drainage to the area.  No clinical evidence of infection ? ?Vascular: VAS [place] ABI W/WO TBI 08/20/2021 ?Summary:  ?Bilateral: Bilateral ankle-brachial indexes are within normal range. No  ?evidence of significant lower extremity arterial disease. Bilateral  ?toe-brachial indexes are within normal range.  ?   ? ?Neurological: Epicritic and protective threshold diminished bilaterally.  Patient does experience numbness with tingling and burning sensation extending up into his legs, thigh, buttocks, and lower back.  Clinically concerning for a lumbar radiculopathy ? ?Musculoskeletal Exam: Range of motion within normal limits to all pedal and ankle joints bilateral. Muscle strength 5/5 in all groups bilateral.  History of prior toe amputations fourth and fifth digit right  foot ? ?Assessment: ?1.  Ulcer right great toe secondary to diabetes mellitus; resolved ?2. diabetes mellitus w/ peripheral neuropathy ?3.  Lumbar radiculopathy ? ? ?Plan of Care:  ?1. Patient was evaluated. ?2.  Patient has completed the doxycycline 100 mg 2 times daily #20 that was prescribed and the patient states that he believes it helped significantly ?3.  Continue the gentamicin cream to the toes daily ?4.  Light debridement of the nail plate and callus with superficial eschar was performed using a tissue nipper especially to the bilateral great toes ?5.  Referral placed to Dr. Lou Cal, to address the lumbar radiculopathy  ?6.  Return to clinic 3 months now for routine foot care ? ? ? ?Edrick Kins, DPM ?[place] ? ?Dr. Edrick Kins, DPM  ?  ?[address]                                      ?[place], [place] [address]                ?Office [phone]  ?Fax [phone] ? ? ? ? ?

## 2021-12-08 ENCOUNTER — Other Ambulatory Visit: Payer: 59

## 2021-12-08 DIAGNOSIS — E349 Endocrine disorder, unspecified: Secondary | ICD-10-CM

## 2021-12-08 DIAGNOSIS — E291 Testicular hypofunction: Secondary | ICD-10-CM

## 2021-12-09 ENCOUNTER — Telehealth: Payer: Self-pay

## 2021-12-09 LAB — HEMOGLOBIN AND HEMATOCRIT, BLOOD
Hematocrit: 51.9 % — ABNORMAL HIGH (ref 37.5–51.0)
Hemoglobin: 18 g/dL — ABNORMAL HIGH (ref 13.0–17.7)

## 2021-12-09 LAB — TESTOSTERONE: Testosterone: 270 ng/dL (ref 264–916)

## 2021-12-09 NOTE — Telephone Encounter (Signed)
Pt states he has an appt to see his PCP on 6/19 and will speak with him then about the CPAP machine.  ?

## 2021-12-09 NOTE — Telephone Encounter (Signed)
Advised pt of results. Pt has not donated blood, he would like to know if that is something he needs to do. Pt does not sleep w CPAP machine. He states he has one but it quit working. Pt would also like for Larene [place] to know that his sildenafil has stopped working.  ?

## 2021-12-09 NOTE — Telephone Encounter (Signed)
-----   Message from Nori Riis, PA-C sent at 12/09/2021  7:58 AM EDT ----- ?Please let Jeff [name] know that his hemoglobin and hematocrit are still elevated, so we cannot restart his testosterone injections at this time.  Has he donated blood?  Does he sleep a CPAP machine? ?

## 2021-12-12 ENCOUNTER — Telehealth: Payer: Self-pay

## 2021-12-12 NOTE — Telephone Encounter (Signed)
Patient called stating his CPAP machine has stopped working and needs a new one.

## 2021-12-14 ENCOUNTER — Other Ambulatory Visit: Payer: Self-pay | Admitting: Family Medicine

## 2021-12-14 DIAGNOSIS — I10 Essential (primary) hypertension: Secondary | ICD-10-CM

## 2021-12-15 ENCOUNTER — Encounter: Payer: Self-pay | Admitting: Family Medicine

## 2021-12-15 ENCOUNTER — Ambulatory Visit (INDEPENDENT_AMBULATORY_CARE_PROVIDER_SITE_OTHER): Payer: Medicare Other | Admitting: Family Medicine

## 2021-12-15 VITALS — BP 136/87 | HR 61 | Temp 97.6°F | Resp 16 | Ht 68.0 in | Wt 211.4 lb

## 2021-12-15 DIAGNOSIS — G4733 Obstructive sleep apnea (adult) (pediatric): Secondary | ICD-10-CM

## 2021-12-15 DIAGNOSIS — F3341 Major depressive disorder, recurrent, in partial remission: Secondary | ICD-10-CM | POA: Diagnosis not present

## 2021-12-15 NOTE — Progress Notes (Signed)
Established patient visit   Patient: Jeff [name]   DOB: 03-02-1961   61 y.o. Male  MRN: [id number] Visit Date: 12/15/2021  Today's healthcare provider: [name], MD    I,Tiffany Darlina Sicilian as a scribe for [name], MD.,have documented all relevant documentation on the behalf of [name], MD,as directed by  [name], MD while in the presence of [name], MD.   No chief complaint on file.  Subjective    HPI  Patient is a 61 year old male who presents today for face to face evaluation for replacement CPAP machine.   She has known sleep apnea and just needs an machine..  Its been a while since he had a sleep study so eating or needs a sleep study or auto titrating CPAP. Medications: Outpatient Medications Prior to Visit  Medication Sig   amLODipine (NORVASC) 5 MG tablet Take 1 tablet (5 mg total) by mouth daily.   ARIPiprazole (ABILIFY) 5 MG tablet TAKE 1 TABLET BY MOUTH EVERY DAY   aspirin 81 MG tablet Take 81 mg by mouth daily.   doxycycline (VIBRA-TABS) 100 MG tablet Take 1 tablet (100 mg total) by mouth 2 (two) times daily.   empagliflozin (JARDIANCE) 25 MG TABS tablet Take 1 tablet (25 mg total) by mouth daily.   gentamicin cream (GARAMYCIN) 0.1 % Apply 1 application topically 2 (two) times daily.   gentamicin cream (GARAMYCIN) 0.1 % Apply 1 application topically 3 (three) times daily.   hydrocortisone 2.5 % cream Apply topically.   ibuprofen (ADVIL) 400 MG tablet Take 400 mg by mouth 3 (three) times daily as needed.   losartan (COZAAR) 100 MG tablet TAKE 1 TABLET BY MOUTH EVERY DAY   metFORMIN (GLUCOPHAGE) 1000 MG tablet Take 1 tablet (1,000 mg total) by mouth 2 (two) times daily with a meal.   methocarbamol (ROBAXIN) 500 MG tablet SMARTSIG:0.5-1 Tablet(s) By Mouth 2-3 Times Daily   metoprolol succinate (TOPROL-XL) 50 MG 24 hr tablet Take 1 tablet (50 mg total) by mouth daily. Take with or immediately following a  meal.   mometasone (ELOCON) 0.1 % cream Apply 1 application topically daily.   NEEDLE, DISP, 18 G (BD DISP NEEDLES) 18G X 1-1/2" MISC 1 mg by Does not apply route every 14 (fourteen) days.   NEEDLE, DISP, 21 G (BD DISP NEEDLES) 21G X 1-1/2" MISC 1 mg by Does not apply route every 14 (fourteen) days.   ONETOUCH VERIO test strip CHECK SUGAR ONCE DAILY DX E11.9   oxyCODONE (ROXICODONE) 15 MG immediate release tablet    pregabalin (LYRICA) 100 MG capsule Take 2 capsules (200 mg total) by mouth 3 (three) times daily. LIMIT 1 CAPSULE BY MOUTH 3 - 5 TIMES PER DAY IF TOLERATED   rosuvastatin (CRESTOR) 40 MG tablet TAKE 1 TABLET BY MOUTH EVERY DAY   sildenafil (REVATIO) 20 MG tablet TAKE 1 TO 5 TABLETS BY MOUTH DAILY AS NEEDED   Syringe, Disposable, (2-3CC SYRINGE) 3 ML MISC 1 mg by Does not apply route every 14 (fourteen) days.   testosterone cypionate (DEPOTESTOSTERONE CYPIONATE) 200 MG/ML injection Inject 1 cc every 14 days   venlafaxine (EFFEXOR) 75 MG tablet TAKE 3 TABLETS (225 MG TOTAL) BY MOUTH DAILY.   baclofen (LIORESAL) 10 MG tablet    hydrochlorothiazide (HYDRODIURIL) 25 MG tablet TAKE 1 TABLET BY MOUTH EVERY DAY. *INSURANCE ONLY COVERS 30 DAYS**   No facility-administered medications prior to visit.    Review  of Systems      Objective    BP 136/87 (BP Location: Left Arm, Patient Position: Sitting, Cuff Size: Normal)   Pulse 61   Temp 97.6 F (36.4 C) (Oral)   Resp 16   Ht '5\' 8"'$  (1.727 m)   Wt 211 lb 6.4 oz (95.9 kg)   SpO2 99%   BMI 32.14 kg/m  BP Readings from Last 3 Encounters:  12/15/21 136/87  10/16/21 136/80  10/15/21 136/79   Wt Readings from Last 3 Encounters:  12/15/21 211 lb 6.4 oz (95.9 kg)  10/16/21 203 lb (92.1 kg)  09/30/21 211 lb (95.7 kg)      Physical Exam Vitals reviewed.  Constitutional:      General: He is not in acute distress.    Appearance: He is well-developed.  HENT:     Head: Normocephalic and atraumatic.     Right Ear: Hearing normal.      Left Ear: Hearing normal.     Nose: Nose normal.  Eyes:     General: Lids are normal. No scleral icterus.       Right eye: No discharge.        Left eye: No discharge.     Conjunctiva/sclera: Conjunctivae normal.  Pulmonary:     Effort: Pulmonary effort is normal. No respiratory distress.  Musculoskeletal:        General: Normal range of motion.  Skin:    Findings: No lesion or rash.  Neurological:     General: No focal deficit present.     Mental Status: He is alert and oriented to person, place, and time.  Psychiatric:        Mood and Affect: Mood normal.        Speech: Speech normal.        Behavior: Behavior normal.        Thought Content: Thought content normal.        Judgment: Judgment normal.      No results found for any visits on 12/15/21.  Assessment & Plan     1. OSA (obstructive sleep apnea) Auto titrating CPAP with by ideal order split-night sleep study get a new machine. - Ambulatory referral to Sleep Studies  2. Recurrent major depressive disorder, in partial remission ([place]) In partial remission.   No follow-ups on file.      I, [name], MD, have reviewed all documentation for this visit. The documentation on 12/19/21 for the exam, diagnosis, procedures, and orders are all accurate and complete.    Eliazar Olivar Cranford Mon, MD  [facility] [phone] (phone) [phone] (fax)  [place]

## 2021-12-16 NOTE — Telephone Encounter (Signed)
Requested Prescriptions  ?Pending Prescriptions Disp Refills  ?? amLODipine (NORVASC) 5 MG tablet [Pharmacy Med Name: AMLODIPINE BESYLATE 5 MG TAB] 30 tablet 2  ?  Sig: TAKE 1 TABLET (5 MG TOTAL) BY MOUTH DAILY.  ?  ? Cardiovascular: Calcium Channel Blockers 2 Passed - 12/14/2021 11:22 AM  ?  ?  Passed - Last BP in normal range  ?  BP Readings from Last 1 Encounters:  ?12/15/21 136/87  ?   ?  ?  Passed - Last Heart Rate in normal range  ?  Pulse Readings from Last 1 Encounters:  ?12/15/21 61  ?   ?  ?  Passed - Valid encounter within last 6 months  ?  Recent Outpatient Visits   ?      ? Yesterday OSA (obstructive sleep apnea)  ? [facility] Jerrol Banana., MD  ? 2 months ago Essential (primary) hypertension  ? [facility] Jerrol Banana., MD  ? 3 months ago Type 2 diabetes mellitus with diabetic polyneuropathy, without long-term current use of insulin ([place])  ? [place], DO  ? 8 months ago Type 2 diabetes mellitus with diabetic polyneuropathy, without long-term current use of insulin ([place])  ? [facility] Jerrol Banana., MD  ? 10 months ago Recurrent major depressive disorder, in partial remission ([place])  ? [facility] Jerrol Banana., MD  ?  ?  ?Future Appointments   ?        ? In 1 week Brendolyn Patty, MD [place]  ? In 1 month Jerrol Banana., MD [facility]  ?  ? ?  ?  ?  ? ?

## 2021-12-22 ENCOUNTER — Telehealth: Payer: Self-pay

## 2021-12-22 NOTE — Telephone Encounter (Signed)
Pt states he would like to think ab moving on to the injections. Pt is nervous about injecting. Advised pt to call office should he change his mind.

## 2021-12-22 NOTE — Telephone Encounter (Signed)
-----   Message from Nori Riis, PA-C sent at 12/22/2021  9:32 AM EDT ----- I tried to call Mr. [place], but he could not hear me on his cell phone.  I was calling to discuss his Viagra not working.  If his Viagra is not working, we will need to move on to the injections into the penis for erections.

## 2021-12-23 ENCOUNTER — Ambulatory Visit: Payer: Medicare Other | Admitting: Dermatology

## 2021-12-23 DIAGNOSIS — C44319 Basal cell carcinoma of skin of other parts of face: Secondary | ICD-10-CM | POA: Diagnosis not present

## 2021-12-23 DIAGNOSIS — L578 Other skin changes due to chronic exposure to nonionizing radiation: Secondary | ICD-10-CM | POA: Diagnosis not present

## 2021-12-23 DIAGNOSIS — L570 Actinic keratosis: Secondary | ICD-10-CM | POA: Diagnosis not present

## 2021-12-23 DIAGNOSIS — L821 Other seborrheic keratosis: Secondary | ICD-10-CM | POA: Diagnosis not present

## 2021-12-23 DIAGNOSIS — D489 Neoplasm of uncertain behavior, unspecified: Secondary | ICD-10-CM

## 2021-12-23 DIAGNOSIS — C4491 Basal cell carcinoma of skin, unspecified: Secondary | ICD-10-CM

## 2021-12-23 HISTORY — DX: Basal cell carcinoma of skin, unspecified: C44.91

## 2021-12-23 NOTE — Patient Instructions (Addendum)
Biopsy Wound Care Instructions  Leave the original bandage on for 24 hours if possible.  If the bandage becomes soaked or soiled before that time, it is OK to remove it and examine the wound.  A small amount of post-operative bleeding is normal.  If excessive bleeding occurs, remove the bandage, place gauze over the site and apply continuous pressure (no peeking) over the area for 30 minutes. If this does not work, please call our clinic as soon as possible or page your doctor if it is after hours.   Once a day, cleanse the wound with soap and water. It is fine to shower. If a thick crust develops you may use a Q-tip dipped into dilute hydrogen peroxide (mix 1:1 with water) to dissolve it.  Hydrogen peroxide can slow the healing process, so use it only as needed.    After washing, apply petroleum jelly (Vaseline) or an antibiotic ointment if your doctor prescribed one for you, followed by a bandage.    For best healing, the wound should be covered with a layer of ointment at all times. If you are not able to keep the area covered with a bandage to hold the ointment in place, this may mean re-applying the ointment several times a day.  Continue this wound care until the wound has healed and is no longer open.   Itching and mild discomfort is normal during the healing process. However, if you develop pain or severe itching, please call our office.   If you have any discomfort, you can take Tylenol (acetaminophen) or ibuprofen as directed on the bottle. (Please do not take these if you have an allergy to them or cannot take them for another reason).  Some redness, tenderness and white or yellow material in the wound is normal healing.  If the area becomes very sore and red, or develops a thick yellow-green material (pus), it may be infected; please notify us.    If you have stitches, return to clinic as directed to have the stitches removed. You will continue wound care for 2-3 days after the stitches  are removed.   Wound healing continues for up to one year following surgery. It is not unusual to experience pain in the scar from time to time during the interval.  If the pain becomes severe or the scar thickens, you should notify the office.    A slight amount of redness in a scar is expected for the first six months.  After six months, the redness will fade and the scar will soften and fade.  The color difference becomes less noticeable with time.  If there are any problems, return for a post-op surgery check at your earliest convenience.  To improve the appearance of the scar, you can use silicone scar gel, cream, or sheets (such as Mederma or Serica) every night for up to one year. These are available over the counter (without a prescription).  Please call our office at [phone] for any questions or concerns.       Actinic keratoses are precancerous spots that appear secondary to cumulative UV radiation exposure/sun exposure over time. They are chronic with expected duration over 1 year. A portion of actinic keratoses will progress to squamous cell carcinoma of the skin. It is not possible to reliably predict which spots will progress to skin cancer and so treatment is recommended to prevent development of skin cancer.  Recommend daily broad spectrum sunscreen SPF 30+ to sun-exposed areas, reapply every 2 hours  as needed.  Recommend staying in the shade or wearing long sleeves, sun glasses (UVA+UVB protection) and wide brim hats (4-inch brim around the entire circumference of the hat). Call for new or changing lesions.    Cryotherapy Aftercare  Wash gently with soap and water everyday.   Apply Vaseline and Band-Aid daily until healed.   If You Need Anything After Your Visit  If you have any questions or concerns for your doctor, please call our main line at [phone] and press option 4 to reach your doctor's medical assistant. If no one answers, please leave a voicemail as  directed and we will return your call as soon as possible. Messages left after 4 pm will be answered the following business day.   You may also send [place] a message via [place]. We typically respond to MyChart messages within 1-2 business days.  For prescription refills, please ask your pharmacy to contact our office. Our fax number is [phone].  If you have an urgent issue when the clinic is closed that cannot wait until the next business day, you can page your doctor at the number below.    Please note that while we do our best to be available for urgent issues outside of office hours, we are not available 24/7.   If you have an urgent issue and are unable to reach [place], you may choose to seek medical care at your doctor's office, retail clinic, urgent care center, or emergency room.  If you have a medical emergency, please immediately call 911 or go to the emergency department.  Pager Numbers  - Dr. Nehemiah Massed: [phone]  - Dr. Laurence Ferrari: [phone]  - Dr. Nicole Kindred: [phone]  In the event of inclement weather, please call our main line at [phone] for an update on the status of any delays or closures.  Dermatology Medication Tips: Please keep the boxes that topical medications come in in order to help keep track of the instructions about where and how to use these. Pharmacies typically print the medication instructions only on the boxes and not directly on the medication tubes.   If your medication is too expensive, please contact our office at [phone] option 4 or send [place] a message through [place].   We are unable to tell what your co-pay for medications will be in advance as this is different depending on your insurance coverage. However, we may be able to find a substitute medication at lower cost or fill out paperwork to get insurance to cover a needed medication.   If a prior authorization is required to get your medication covered by your insurance company, please  allow [place] 1-2 business days to complete this process.  Drug prices often vary depending on where the prescription is filled and some pharmacies may offer cheaper prices.  The website www.goodrx.com contains coupons for medications through different pharmacies. The prices here do not account for what the cost may be with help from insurance (it may be cheaper with your insurance), but the website can give you the price if you did not use any insurance.  - You can print the associated coupon and take it with your prescription to the pharmacy.  - You may also stop by our office during regular business hours and pick up a GoodRx coupon card.  - If you need your prescription sent electronically to a different pharmacy, notify our office through [facility] or by phone at [phone] option 4.     Si Usted [employer]  Despus de Su Visita  Tambin puede enviarnos un mensaje a travs de MyChart. Por lo general respondemos a los mensajes de MyChart en el transcurso de 1 a 2 das hbiles.  Para renovar recetas, por favor pida a su farmacia que se ponga en contacto con nuestra oficina. Harland Dingwall de fax es [place] [phone].  Si tiene un asunto urgente cuando la clnica est cerrada y que no puede esperar hasta el siguiente da hbil, puede llamar/localizar a su doctor(a) al nmero que aparece a continuacin.   Por favor, tenga en cuenta que aunque hacemos todo lo posible para estar disponibles para asuntos urgentes fuera del horario de [place], no estamos disponibles las 24 horas del da, los 7 das de la [place].   Si tiene un problema urgente y no puede comunicarse con nosotros, puede optar por buscar atencin mdica  en el consultorio de su doctor(a), en una clnica privada, en un centro de atencin urgente o en una sala de emergencias.  Si tiene Engineering geologist, por favor llame inmediatamente al 911 o vaya a la sala de emergencias.  Nmeros de bper  - Dr. Nehemiah Massed:  [phone]  - Dra. Moye: [phone]  - Dra. Nicole Kindred: [phone]  En caso de inclemencias del [place], por favor llame a Johnsie Kindred principal al [phone] para una actualizacin sobre el [place] de cualquier retraso o cierre.  Consejos para la medicacin en dermatologa: Por favor, guarde las cajas en las que vienen los medicamentos de uso tpico para ayudarle a seguir las instrucciones sobre dnde y cmo usarlos. Las farmacias generalmente imprimen las instrucciones del medicamento slo en las cajas y no directamente en los tubos del [place].   Si su medicamento es muy caro, por favor, pngase en contacto con Zigmund Daniel llamando al [phone] y presione la opcin 4 o envenos un mensaje a travs de Pharmacist, community.   No podemos decirle cul ser su copago por los medicamentos por adelantado ya que esto es diferente dependiendo de la cobertura de su seguro. Sin embargo, es posible que podamos encontrar un medicamento sustituto a Electrical engineer un formulario para que el seguro cubra el medicamento que se considera necesario.   Si se requiere una autorizacin previa para que su compaa de seguros [place] su medicamento, por favor permtanos de 1 a 2 das hbiles para completar este proceso.  Los precios de los medicamentos varan con frecuencia dependiendo del Environmental consultant de dnde se surte la receta y alguna farmacias pueden ofrecer precios ms baratos.  El sitio web www.goodrx.com tiene cupones para medicamentos de Airline pilot. Los precios aqu no tienen en cuenta lo que podra costar con la ayuda del seguro (puede ser ms barato con su seguro), pero el sitio web puede darle el precio si no utiliz Research scientist (physical sciences).  - Puede imprimir el cupn correspondiente y llevarlo con su receta a la farmacia.  - Tambin puede pasar por nuestra oficina durante el horario de atencin regular y Charity fundraiser una tarjeta de cupones de GoodRx.  - Si necesita que su receta se enve electrnicamente a  una farmacia diferente, informe a nuestra oficina a travs de MyChart de [place] o por telfono llamando al [phone] y presione la opcin 4.

## 2021-12-23 NOTE — Progress Notes (Signed)
Follow-Up Visit   Subjective  Jeff [name] is a 61 y.o. male who presents for the following: Actinic Keratosis (Hx of aks at hands, face, scalp. Patient here for follow up on areas. Patient also reports a spot at right temple that doesn't go away. Hx of tick bite 4 - 5 weeks ago. ).  The patient has spots, moles and lesions to be evaluated, some may be new or changing and the patient has concerns that these could be cancer.  The following portions of the chart were reviewed this encounter and updated as appropriate:      Review of Systems: No other skin or systemic complaints except as noted in HPI or Assessment and Plan.   Objective  Well appearing patient in no apparent distress; mood and affect are within normal limits.  A focused examination was performed including b/l arms and hands, face, scalp, neck. Relevant physical exam findings are noted in the Assessment and Plan.  rt hand x 3, left hand x 3, nasal tip x 1, (7) Keratotic macules/papules  right upper temple 5 mm pink pearly papule with central crust        Assessment & Plan  Actinic keratosis (7) rt hand x 3, left hand x 3, nasal tip x 1,  Actinic keratoses are precancerous spots that appear secondary to cumulative UV radiation exposure/sun exposure over time. They are chronic with expected duration over 1 year. A portion of actinic keratoses will progress to squamous cell carcinoma of the skin. It is not possible to reliably predict which spots will progress to skin cancer and so treatment is recommended to prevent development of skin cancer.  Recommend daily broad spectrum sunscreen SPF 30+ to sun-exposed areas, reapply every 2 hours as needed.  Recommend staying in the shade or wearing long sleeves, sun glasses (UVA+UVB protection) and wide brim hats (4-inch brim around the entire circumference of the hat). Call for new or changing lesions.  Destruction of lesion - rt hand x 3, left hand x 3, nasal tip x  1,  Destruction method: cryotherapy   Informed consent: discussed and consent obtained   Timeout:  patient name, date of birth, surgical site, and procedure verified Lesion destroyed using liquid nitrogen: Yes   Region frozen until ice ball extended beyond lesion: Yes   Outcome: patient tolerated procedure well with no complications   Post-procedure details: wound care instructions given   Additional details:  Prior to procedure, discussed risks of blister formation, small wound, skin dyspigmentation, or rare scar following cryotherapy. Recommend Vaseline ointment to treated areas while healing.   Neoplasm of uncertain behavior right upper temple  Skin / nail biopsy Type of biopsy: tangential   Informed consent: discussed and consent obtained   Patient was prepped and draped in usual sterile fashion: Area prepped with alcohol. Anesthesia: the lesion was anesthetized in a standard fashion   Anesthetic:  1% lidocaine w/ epinephrine 1-100,000 buffered w/ 8.4% NaHCO3 Instrument used: flexible razor blade   Hemostasis achieved with: pressure, aluminum chloride and electrodesiccation   Outcome: patient tolerated procedure well   Post-procedure details: wound care instructions given   Post-procedure details comment:  Ointment and small bandage applied  Specimen 1 - Surgical pathology Differential Diagnosis: Tick bite reaction r/o bcc  Check Margins: No  Hx of tick bite at site 4 - 5 weeks ago  Tick bite reaction r/o bcc    Seborrheic Keratoses - Stuck-on, waxy, tan-brown papules and/or plaques  - Benign-appearing - Discussed  benign etiology and prognosis. - Observe - Call for any changes  Actinic Damage - chronic, secondary to cumulative UV radiation exposure/sun exposure over time - diffuse scaly erythematous macules with underlying dyspigmentation - Recommend daily broad spectrum sunscreen SPF 30+ to sun-exposed areas, reapply every 2 hours as needed.  - Recommend staying  in the shade or wearing long sleeves, sun glasses (UVA+UVB protection) and wide brim hats (4-inch brim around the entire circumference of the hat). - Call for new or changing lesions.  Return in about 6 months (around 06/25/2022) for ak followup. I, Ruthell Rummage, CMA, am acting as scribe for Brendolyn Patty, MD.  Documentation: I have reviewed the above documentation for accuracy and completeness, and I agree with the above.  Brendolyn Patty MD

## 2021-12-30 ENCOUNTER — Telehealth: Payer: Self-pay

## 2021-12-30 NOTE — Telephone Encounter (Signed)
-----   Message from Brendolyn Patty, MD sent at 12/30/2021  1:00 PM EDT ----- Skin , right upper temple BASAL CELL CARCINOMA, NODULAR PATTERN, BASE INVOLVED  BCC skin cancer- needs EDC vrs excision   - please call patient

## 2021-12-30 NOTE — Telephone Encounter (Signed)
Advised pt of bx results.  Scheduled pt for Exc vs EDC./sh

## 2022-01-16 NOTE — Progress Notes (Unsigned)
Established patient visit   Patient: Jeff [name]   DOB: 08-17-1960   61 y.o. Male  MRN: [id number] Visit Date: 01/19/2022  Today's healthcare provider: [name], MD   No chief complaint on file.  Subjective    HPI  Hypertension, follow-up  BP Readings from Last 3 Encounters:  01/19/22 (!) 151/85  12/15/21 136/87  10/16/21 136/80   Wt Readings from Last 3 Encounters:  01/19/22 212 lb (96.2 kg)  12/15/21 211 lb 6.4 oz (95.9 kg)  10/16/21 203 lb (92.1 kg)     He was last seen for hypertension 3 months ago.  BP at that visit was as above. Management since that visit includes none.  He reports good compliance with treatment. He is not having side effects.  He is following a Regular diet. He is not exercising as a routine structured thing, but has a physical job and gets exercise that way. He does not smoke.  Use of agents associated with hypertension: none.   Outside blood pressures are running a little high when he checks it in the evening.  He does not have the readings. Symptoms: No chest pain No chest pressure  No palpitations No syncope  No dyspnea No orthopnea  No paroxysmal nocturnal dyspnea No lower extremity edema   Pertinent labs Lab Results  Component Value Date   CHOL 130 03/25/2021   HDL 29 (L) 03/25/2021   LDLCALC 75 03/25/2021   TRIG 148 03/25/2021   CHOLHDL 4.5 03/25/2021   Lab Results  Component Value Date   NA 141 10/16/2021   K 4.6 10/16/2021   CREATININE 1.48 (H) 10/16/2021   EGFR 53 (L) 10/16/2021   GLUCOSE 104 (H) 10/16/2021   TSH 1.410 03/25/2021     The 10-year ASCVD risk score (Arnett DK, et al., 2019) is: 30%  --------------------------------------------------------------------------------------------------- Diabetes Mellitus Type II, Follow-up  Lab Results  Component Value Date   HGBA1C 7.3 (H) 09/09/2021   HGBA1C 7.4 (H) 03/25/2021   HGBA1C 6.5 (H) 10/28/2020   Wt Readings from Last 3 Encounters:   01/19/22 212 lb (96.2 kg)  12/15/21 211 lb 6.4 oz (95.9 kg)  10/16/21 203 lb (92.1 kg)   Last seen for diabetes 3 months ago.  Management since then includes none. He reports good compliance with treatment. He is not having side effects.  Symptoms: Yes fatigue No foot ulcerations  No appetite changes No nausea  No paresthesia of the feet  No polydipsia  No polyuria No visual disturbances   No vomiting     Home blood sugar records:  patient does not know the readings but states it stays good.  Episodes of hypoglycemia? No    Current insulin regiment: none Most Recent Eye Exam: patient is due  Pertinent Labs: Lab Results  Component Value Date   CHOL 130 03/25/2021   HDL 29 (L) 03/25/2021   LDLCALC 75 03/25/2021   TRIG 148 03/25/2021   CHOLHDL 4.5 03/25/2021   Lab Results  Component Value Date   NA 141 10/16/2021   K 4.6 10/16/2021   CREATININE 1.48 (H) 10/16/2021   EGFR 53 (L) 10/16/2021   MICROALBUR neg 03/11/2016     ---------------------------------------------------------------------------------------------------   Medications: Outpatient Medications Prior to Visit  Medication Sig Note   amLODipine (NORVASC) 5 MG tablet TAKE 1 TABLET (5 MG TOTAL) BY MOUTH DAILY.    ARIPiprazole (ABILIFY) 5 MG tablet TAKE 1 TABLET BY MOUTH EVERY DAY    aspirin 81  MG tablet Take 81 mg by mouth daily.    baclofen (LIORESAL) 10 MG tablet     doxycycline (VIBRA-TABS) 100 MG tablet Take 1 tablet (100 mg total) by mouth 2 (two) times daily.    empagliflozin (JARDIANCE) 25 MG TABS tablet Take 1 tablet (25 mg total) by mouth daily.    gentamicin cream (GARAMYCIN) 0.1 % Apply 1 application topically 2 (two) times daily.    gentamicin cream (GARAMYCIN) 0.1 % Apply 1 application topically 3 (three) times daily.    hydrochlorothiazide (HYDRODIURIL) 25 MG tablet TAKE 1 TABLET BY MOUTH EVERY DAY. *INSURANCE ONLY COVERS 30 DAYS**    hydrocortisone 2.5 % cream Apply topically.    ibuprofen  (ADVIL) 400 MG tablet Take 400 mg by mouth 3 (three) times daily as needed.    losartan (COZAAR) 100 MG tablet TAKE 1 TABLET BY MOUTH EVERY DAY    metFORMIN (GLUCOPHAGE) 1000 MG tablet Take 1 tablet (1,000 mg total) by mouth 2 (two) times daily with a meal.    methocarbamol (ROBAXIN) 500 MG tablet SMARTSIG:0.5-1 Tablet(s) By Mouth 2-3 Times Daily    metoprolol succinate (TOPROL-XL) 50 MG 24 hr tablet Take 1 tablet (50 mg total) by mouth daily. Take with or immediately following a meal.    mometasone (ELOCON) 0.1 % cream Apply 1 application topically daily.    NEEDLE, DISP, 18 G (BD DISP NEEDLES) 18G X 1-1/2" MISC 1 mg by Does not apply route every 14 (fourteen) days.    NEEDLE, DISP, 21 G (BD DISP NEEDLES) 21G X 1-1/2" MISC 1 mg by Does not apply route every 14 (fourteen) days.    ONETOUCH VERIO test strip CHECK SUGAR ONCE DAILY DX E11.9    oxyCODONE (ROXICODONE) 15 MG immediate release tablet     pregabalin (LYRICA) 100 MG capsule Take 2 capsules (200 mg total) by mouth 3 (three) times daily. LIMIT 1 CAPSULE BY MOUTH 3 - 5 TIMES PER DAY IF TOLERATED    rosuvastatin (CRESTOR) 40 MG tablet TAKE 1 TABLET BY MOUTH EVERY DAY    sildenafil (REVATIO) 20 MG tablet TAKE 1 TO 5 TABLETS BY MOUTH DAILY AS NEEDED    Syringe, Disposable, (2-3CC SYRINGE) 3 ML MISC 1 mg by Does not apply route every 14 (fourteen) days.    venlafaxine (EFFEXOR) 75 MG tablet TAKE 3 TABLETS (225 MG TOTAL) BY MOUTH DAILY.    testosterone cypionate (DEPOTESTOSTERONE CYPIONATE) 200 MG/ML injection Inject 1 cc every 14 days (Patient not taking: Reported on 01/19/2022) 01/19/2022: Currently om hold    No facility-administered medications prior to visit.    Review of Systems  Constitutional:  Negative for appetite change, chills and fever.  Respiratory:  Negative for chest tightness, shortness of breath and wheezing.   Cardiovascular:  Negative for chest pain and palpitations.  Gastrointestinal:  Negative for abdominal pain, nausea  and vomiting.    {Labs  Heme  Chem  Endocrine  Serology  Results Review (optional):[id number]}   Objective    BP (!) 151/85 (BP Location: Left Arm, Patient Position: Sitting, Cuff Size: Normal)   Pulse (!) 58   Temp (!) 97.5 F (36.4 C) (Oral)   Wt 212 lb (96.2 kg)   SpO2 100%   BMI 32.23 kg/m  Vitals:   01/19/22 1042 01/19/22 1051  BP: (!) 157/87 (!) 151/85  Pulse: (!) 58   Temp: (!) 97.5 F (36.4 C)   TempSrc: Oral   SpO2: 100%   Weight: 212 lb (96.2 kg)  Physical Exam  ***  No results found for any visits on 01/19/22.  Assessment & Plan     ***  No follow-ups on file.      {provider attestation***:1}   [name], MD  [facility] [phone] (phone) [phone] (fax)  [place]

## 2022-01-19 ENCOUNTER — Ambulatory Visit (INDEPENDENT_AMBULATORY_CARE_PROVIDER_SITE_OTHER): Payer: Medicare Other | Admitting: Family Medicine

## 2022-01-19 VITALS — BP 151/85 | HR 58 | Temp 97.5°F | Wt 212.0 lb

## 2022-01-19 DIAGNOSIS — E785 Hyperlipidemia, unspecified: Secondary | ICD-10-CM

## 2022-01-19 DIAGNOSIS — G894 Chronic pain syndrome: Secondary | ICD-10-CM

## 2022-01-19 DIAGNOSIS — Z9889 Other specified postprocedural states: Secondary | ICD-10-CM | POA: Diagnosis not present

## 2022-01-19 DIAGNOSIS — E1121 Type 2 diabetes mellitus with diabetic nephropathy: Secondary | ICD-10-CM | POA: Diagnosis not present

## 2022-01-19 DIAGNOSIS — I10 Essential (primary) hypertension: Secondary | ICD-10-CM

## 2022-01-19 DIAGNOSIS — E291 Testicular hypofunction: Secondary | ICD-10-CM

## 2022-01-19 DIAGNOSIS — N289 Disorder of kidney and ureter, unspecified: Secondary | ICD-10-CM

## 2022-01-19 DIAGNOSIS — E1142 Type 2 diabetes mellitus with diabetic polyneuropathy: Secondary | ICD-10-CM | POA: Diagnosis not present

## 2022-01-19 DIAGNOSIS — I2510 Atherosclerotic heart disease of native coronary artery without angina pectoris: Secondary | ICD-10-CM | POA: Diagnosis not present

## 2022-01-19 DIAGNOSIS — M5417 Radiculopathy, lumbosacral region: Secondary | ICD-10-CM

## 2022-01-19 DIAGNOSIS — F3341 Major depressive disorder, recurrent, in partial remission: Secondary | ICD-10-CM

## 2022-01-19 LAB — POCT GLYCOSYLATED HEMOGLOBIN (HGB A1C): Hemoglobin A1C: 6.7 % — AB (ref 4.0–5.6)

## 2022-01-19 MED ORDER — HYDRALAZINE HCL 25 MG PO TABS: 25.0000 mg | ORAL_TABLET | Freq: Two times a day (BID) | ORAL | 6 refills | Status: DC

## 2022-01-19 NOTE — Patient Instructions (Signed)
Dr Rosanna Randy would like you to try over the counter Turmeric  daily for the neck pain.    Start Hydralazine 25 mg twice daily, sent to pharmacy  Referral has been placed to the pain clinic  Call Parke Poisson at [phone] to check on sleep study

## 2022-01-20 LAB — RENAL FUNCTION PANEL
Albumin: 4.8 g/dL (ref 3.8–4.8)
BUN/Creatinine Ratio: 10 (ref 10–24)
BUN: 11 mg/dL (ref 8–27)
CO2: 23 mmol/L (ref 20–29)
Calcium: 9.6 mg/dL (ref 8.6–10.2)
Chloride: 104 mmol/L (ref 96–106)
Creatinine, Ser: 1.12 mg/dL (ref 0.76–1.27)
Glucose: 120 mg/dL — ABNORMAL HIGH (ref 70–99)
Phosphorus: 2.7 mg/dL — ABNORMAL LOW (ref 2.8–4.1)
Potassium: 4.4 mmol/L (ref 3.5–5.2)
Sodium: 143 mmol/L (ref 134–144)
eGFR: 74 mL/min/{1.73_m2} (ref >59)

## 2022-01-23 DIAGNOSIS — H35371 Puckering of macula, right eye: Secondary | ICD-10-CM | POA: Diagnosis not present

## 2022-01-23 DIAGNOSIS — H31011 Macula scars of posterior pole (postinflammatory) (post-traumatic), right eye: Secondary | ICD-10-CM | POA: Diagnosis not present

## 2022-01-23 LAB — HM DIABETES EYE EXAM

## 2022-01-26 ENCOUNTER — Ambulatory Visit: Payer: Medicare Other | Admitting: Dermatology

## 2022-01-26 ENCOUNTER — Encounter: Payer: Self-pay | Admitting: Dermatology

## 2022-01-26 DIAGNOSIS — C44319 Basal cell carcinoma of skin of other parts of face: Secondary | ICD-10-CM

## 2022-01-26 DIAGNOSIS — L578 Other skin changes due to chronic exposure to nonionizing radiation: Secondary | ICD-10-CM

## 2022-01-26 DIAGNOSIS — C44310 Basal cell carcinoma of skin of unspecified parts of face: Secondary | ICD-10-CM

## 2022-01-26 NOTE — Progress Notes (Signed)
   Follow-Up Visit   Subjective  Jeff [name] is a 61 y.o. male who presents for the following: Bx proven BCC (R upper temple - pt is here today for tx, ED&C vs excision).   The following portions of the chart were reviewed this encounter and updated as appropriate:       Review of Systems:  No other skin or systemic complaints except as noted in HPI or Assessment and Plan.  Objective  Well appearing patient in no apparent distress; mood and affect are within normal limits.  A focused examination was performed including the face. Relevant physical exam findings are noted in the Assessment and Plan.  R upper temple Pink biopsy site.    Assessment & Plan  Basal cell carcinoma (BCC) of skin of face, unspecified part of face R upper temple  Destruction of lesion  Destruction method: electrodesiccation and curettage   Informed consent: discussed and consent obtained   Timeout:  patient name, date of birth, surgical site, and procedure verified Anesthesia: the lesion was anesthetized in a standard fashion   Anesthetic:  1% lidocaine w/ epinephrine 1-100,000 local infiltration Curettage performed in three different directions: Yes   Electrodesiccation performed over the curetted area: Yes   Final wound size (cm):  0.7 Hemostasis achieved with:  pressure, aluminum chloride and electrodesiccation Outcome: patient tolerated procedure well with no complications   Post-procedure details: wound care instructions given   Additional details:  Mupirocin ointment and Bandaid applied    Actinic Damage - chronic, secondary to cumulative UV radiation exposure/sun exposure over time - diffuse scaly erythematous macules with underlying dyspigmentation - Recommend daily broad spectrum sunscreen SPF 30+ to sun-exposed areas, reapply every 2 hours as needed.  - Recommend staying in the shade or wearing long sleeves, sun glasses (UVA+UVB protection) and wide brim hats (4-inch brim around the  entire circumference of the hat). - Call for new or changing lesions.  Return for appointment as scheduled.  Maylene Roes, CMA, am acting as scribe for Willeen Niece, MD .   Documentation: I have reviewed the above documentation for accuracy and completeness, and I agree with the above.  Willeen Niece MD

## 2022-01-31 ENCOUNTER — Inpatient Hospital Stay (HOSPITAL_COMMUNITY)
Admission: EM | Admit: 2022-01-31 | Discharge: 2022-02-03 | DRG: 083 | Disposition: A | Discharge status: Home / Self Care | Payer: Medicare Other | Attending: Surgery | Admitting: Surgery

## 2022-01-31 ENCOUNTER — Encounter (HOSPITAL_COMMUNITY): Payer: Self-pay

## 2022-01-31 ENCOUNTER — Emergency Department (HOSPITAL_COMMUNITY): Payer: Medicare Other

## 2022-01-31 ENCOUNTER — Other Ambulatory Visit: Payer: Self-pay

## 2022-01-31 DIAGNOSIS — Z83438 Family history of other disorder of lipoprotein metabolism and other lipidemia: Secondary | ICD-10-CM

## 2022-01-31 DIAGNOSIS — Z85828 Personal history of other malignant neoplasm of skin: Secondary | ICD-10-CM | POA: Diagnosis not present

## 2022-01-31 DIAGNOSIS — I609 Nontraumatic subarachnoid hemorrhage, unspecified: Secondary | ICD-10-CM | POA: Diagnosis not present

## 2022-01-31 DIAGNOSIS — R410 Disorientation, unspecified: Secondary | ICD-10-CM | POA: Diagnosis not present

## 2022-01-31 DIAGNOSIS — Z8249 Family history of ischemic heart disease and other diseases of the circulatory system: Secondary | ICD-10-CM

## 2022-01-31 DIAGNOSIS — S2242XA Multiple fractures of ribs, left side, initial encounter for closed fracture: Secondary | ICD-10-CM | POA: Diagnosis not present

## 2022-01-31 DIAGNOSIS — Z9842 Cataract extraction status, left eye: Secondary | ICD-10-CM | POA: Diagnosis not present

## 2022-01-31 DIAGNOSIS — I10 Essential (primary) hypertension: Secondary | ICD-10-CM | POA: Diagnosis not present

## 2022-01-31 DIAGNOSIS — S2243XA Multiple fractures of ribs, bilateral, initial encounter for closed fracture: Secondary | ICD-10-CM | POA: Diagnosis not present

## 2022-01-31 DIAGNOSIS — S2231XA Fracture of one rib, right side, initial encounter for closed fracture: Secondary | ICD-10-CM | POA: Diagnosis not present

## 2022-01-31 DIAGNOSIS — Z9841 Cataract extraction status, right eye: Secondary | ICD-10-CM

## 2022-01-31 DIAGNOSIS — S066XAA Traumatic subarachnoid hemorrhage with loss of consciousness status unknown, initial encounter: Secondary | ICD-10-CM | POA: Diagnosis not present

## 2022-01-31 DIAGNOSIS — Y906 Blood alcohol level of 120-199 mg/100 ml: Secondary | ICD-10-CM | POA: Diagnosis present

## 2022-01-31 DIAGNOSIS — Z888 Allergy status to other drugs, medicaments and biological substances status: Secondary | ICD-10-CM | POA: Diagnosis not present

## 2022-01-31 DIAGNOSIS — Z89421 Acquired absence of other right toe(s): Secondary | ICD-10-CM

## 2022-01-31 DIAGNOSIS — Z9852 Vasectomy status: Secondary | ICD-10-CM

## 2022-01-31 DIAGNOSIS — G894 Chronic pain syndrome: Secondary | ICD-10-CM | POA: Diagnosis not present

## 2022-01-31 DIAGNOSIS — Z833 Family history of diabetes mellitus: Secondary | ICD-10-CM

## 2022-01-31 DIAGNOSIS — S06360A Traumatic hemorrhage of cerebrum, unspecified, without loss of consciousness, initial encounter: Secondary | ICD-10-CM | POA: Diagnosis not present

## 2022-01-31 DIAGNOSIS — Z7982 Long term (current) use of aspirin: Secondary | ICD-10-CM | POA: Diagnosis not present

## 2022-01-31 DIAGNOSIS — E785 Hyperlipidemia, unspecified: Secondary | ICD-10-CM | POA: Diagnosis not present

## 2022-01-31 DIAGNOSIS — I6529 Occlusion and stenosis of unspecified carotid artery: Secondary | ICD-10-CM | POA: Diagnosis not present

## 2022-01-31 DIAGNOSIS — K5730 Diverticulosis of large intestine without perforation or abscess without bleeding: Secondary | ICD-10-CM | POA: Diagnosis not present

## 2022-01-31 DIAGNOSIS — Z981 Arthrodesis status: Secondary | ICD-10-CM

## 2022-01-31 DIAGNOSIS — Z79899 Other long term (current) drug therapy: Secondary | ICD-10-CM

## 2022-01-31 DIAGNOSIS — S299XXA Unspecified injury of thorax, initial encounter: Secondary | ICD-10-CM | POA: Diagnosis not present

## 2022-01-31 DIAGNOSIS — M47816 Spondylosis without myelopathy or radiculopathy, lumbar region: Secondary | ICD-10-CM | POA: Diagnosis present

## 2022-01-31 DIAGNOSIS — M542 Cervicalgia: Secondary | ICD-10-CM | POA: Diagnosis not present

## 2022-01-31 DIAGNOSIS — S066X0A Traumatic subarachnoid hemorrhage without loss of consciousness, initial encounter: Secondary | ICD-10-CM | POA: Diagnosis not present

## 2022-01-31 DIAGNOSIS — R9431 Abnormal electrocardiogram [ECG] [EKG]: Secondary | ICD-10-CM | POA: Diagnosis not present

## 2022-01-31 DIAGNOSIS — Z7984 Long term (current) use of oral hypoglycemic drugs: Secondary | ICD-10-CM | POA: Diagnosis not present

## 2022-01-31 DIAGNOSIS — Z87891 Personal history of nicotine dependence: Secondary | ICD-10-CM

## 2022-01-31 DIAGNOSIS — E119 Type 2 diabetes mellitus without complications: Secondary | ICD-10-CM | POA: Diagnosis not present

## 2022-01-31 DIAGNOSIS — N281 Cyst of kidney, acquired: Secondary | ICD-10-CM | POA: Diagnosis not present

## 2022-01-31 DIAGNOSIS — F10129 Alcohol abuse with intoxication, unspecified: Secondary | ICD-10-CM | POA: Diagnosis present

## 2022-01-31 DIAGNOSIS — G4730 Sleep apnea, unspecified: Secondary | ICD-10-CM | POA: Diagnosis present

## 2022-01-31 DIAGNOSIS — Z961 Presence of intraocular lens: Secondary | ICD-10-CM | POA: Diagnosis present

## 2022-01-31 DIAGNOSIS — T1490XA Injury, unspecified, initial encounter: Secondary | ICD-10-CM | POA: Diagnosis not present

## 2022-01-31 DIAGNOSIS — S3993XA Unspecified injury of pelvis, initial encounter: Secondary | ICD-10-CM | POA: Diagnosis not present

## 2022-01-31 DIAGNOSIS — Z86010 Personal history of colonic polyps: Secondary | ICD-10-CM

## 2022-01-31 DIAGNOSIS — R0902 Hypoxemia: Secondary | ICD-10-CM | POA: Diagnosis not present

## 2022-01-31 DIAGNOSIS — I517 Cardiomegaly: Secondary | ICD-10-CM | POA: Diagnosis not present

## 2022-01-31 DIAGNOSIS — S2232XA Fracture of one rib, left side, initial encounter for closed fracture: Secondary | ICD-10-CM | POA: Diagnosis not present

## 2022-01-31 LAB — BASIC METABOLIC PANEL
Anion gap: 9 (ref 5–15)
BUN: 20 mg/dL (ref 8–23)
CO2: 21 mmol/L — ABNORMAL LOW (ref 22–32)
Calcium: 8.5 mg/dL — ABNORMAL LOW (ref 8.9–10.3)
Chloride: 108 mmol/L (ref 98–111)
Creatinine, Ser: 1.3 mg/dL — ABNORMAL HIGH (ref 0.61–1.24)
GFR, Estimated: >60 mL/min (ref >60)
Glucose, Bld: 137 mg/dL — ABNORMAL HIGH (ref 70–99)
Potassium: 3.9 mmol/L (ref 3.5–5.1)
Sodium: 138 mmol/L (ref 135–145)

## 2022-01-31 LAB — CBC
HCT: 48.6 % (ref 39.0–52.0)
Hemoglobin: 17.3 g/dL — ABNORMAL HIGH (ref 13.0–17.0)
MCH: 33 pg (ref 26.0–34.0)
MCHC: 35.6 g/dL (ref 30.0–36.0)
MCV: 92.6 fL (ref 80.0–100.0)
Platelets: 201 10*3/uL (ref 150–400)
RBC: 5.25 MIL/uL (ref 4.22–5.81)
RDW: 13 % (ref 11.5–15.5)
WBC: 7.7 10*3/uL (ref 4.0–10.5)
nRBC: 0 % (ref 0.0–0.2)

## 2022-01-31 LAB — ETHANOL: Alcohol, Ethyl (B): 171 mg/dL — ABNORMAL HIGH (ref <10)

## 2022-01-31 MED ORDER — HYDROMORPHONE HCL 1 MG/ML IJ SOLN
0.5000 mg | Freq: Once | INTRAMUSCULAR | Status: AC
  Administered 2022-02-01: 0.5 mg via INTRAVENOUS
  Filled 2022-01-31: qty 1

## 2022-01-31 MED ORDER — KETOROLAC TROMETHAMINE 30 MG/ML IJ SOLN
30.0000 mg | Freq: Once | INTRAMUSCULAR | Status: AC
  Administered 2022-01-31: 30 mg via INTRAVENOUS
  Filled 2022-01-31: qty 1

## 2022-01-31 MED ORDER — IOHEXOL 350 MG/ML SOLN
100.0000 mL | Freq: Once | INTRAVENOUS | Status: AC | PRN
  Administered 2022-01-31: 100 mL via INTRAVENOUS

## 2022-01-31 MED ORDER — ONDANSETRON HCL 4 MG/2ML IJ SOLN
4.0000 mg | Freq: Once | INTRAMUSCULAR | Status: AC
  Administered 2022-02-01: 4 mg via INTRAVENOUS
  Filled 2022-01-31: qty 2
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
## 2022-01-31 NOTE — Discharge Instructions (Addendum)
Please follow up with your neurosurgeon Dr. Marcello Moores. They are also recommending a repeat head CT in 4 weeks. The phone number for the neurosurgery office [facility]) is [phone].

## 2022-01-31 NOTE — ED Notes (Signed)
Consulted Dr. Langston Masker for pain management orders, patient complaining of neck and back pain while laying in CT scanner, attempting to roll off in order to get comfortable. New orders for Toradol, see EMAR. Inquired if XRAYS of lower extremities are to be ordered, as patient complains of bilateral ankle pain when palpated. No new orders for imaging at this time.
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
## 2022-02-01 ENCOUNTER — Inpatient Hospital Stay (HOSPITAL_COMMUNITY): Payer: Medicare Other

## 2022-02-01 DIAGNOSIS — Z961 Presence of intraocular lens: Secondary | ICD-10-CM | POA: Diagnosis present

## 2022-02-01 DIAGNOSIS — S066XAA Traumatic subarachnoid hemorrhage with loss of consciousness status unknown, initial encounter: Secondary | ICD-10-CM | POA: Diagnosis not present

## 2022-02-01 DIAGNOSIS — M47816 Spondylosis without myelopathy or radiculopathy, lumbar region: Secondary | ICD-10-CM | POA: Diagnosis present

## 2022-02-01 DIAGNOSIS — Y906 Blood alcohol level of 120-199 mg/100 ml: Secondary | ICD-10-CM | POA: Diagnosis present

## 2022-02-01 DIAGNOSIS — Z79899 Other long term (current) drug therapy: Secondary | ICD-10-CM | POA: Diagnosis not present

## 2022-02-01 DIAGNOSIS — Z89421 Acquired absence of other right toe(s): Secondary | ICD-10-CM | POA: Diagnosis not present

## 2022-02-01 DIAGNOSIS — Z7984 Long term (current) use of oral hypoglycemic drugs: Secondary | ICD-10-CM | POA: Diagnosis not present

## 2022-02-01 DIAGNOSIS — S0003XA Contusion of scalp, initial encounter: Secondary | ICD-10-CM | POA: Diagnosis not present

## 2022-02-01 DIAGNOSIS — S2243XA Multiple fractures of ribs, bilateral, initial encounter for closed fracture: Secondary | ICD-10-CM | POA: Diagnosis present

## 2022-02-01 DIAGNOSIS — F10129 Alcohol abuse with intoxication, unspecified: Secondary | ICD-10-CM | POA: Diagnosis present

## 2022-02-01 DIAGNOSIS — Z888 Allergy status to other drugs, medicaments and biological substances status: Secondary | ICD-10-CM | POA: Diagnosis not present

## 2022-02-01 DIAGNOSIS — Z8249 Family history of ischemic heart disease and other diseases of the circulatory system: Secondary | ICD-10-CM | POA: Diagnosis not present

## 2022-02-01 DIAGNOSIS — Z83438 Family history of other disorder of lipoprotein metabolism and other lipidemia: Secondary | ICD-10-CM | POA: Diagnosis not present

## 2022-02-01 DIAGNOSIS — G894 Chronic pain syndrome: Secondary | ICD-10-CM | POA: Diagnosis present

## 2022-02-01 DIAGNOSIS — I10 Essential (primary) hypertension: Secondary | ICD-10-CM | POA: Diagnosis present

## 2022-02-01 DIAGNOSIS — G4730 Sleep apnea, unspecified: Secondary | ICD-10-CM | POA: Diagnosis present

## 2022-02-01 DIAGNOSIS — S066X0A Traumatic subarachnoid hemorrhage without loss of consciousness, initial encounter: Secondary | ICD-10-CM | POA: Diagnosis not present

## 2022-02-01 DIAGNOSIS — Z981 Arthrodesis status: Secondary | ICD-10-CM | POA: Diagnosis not present

## 2022-02-01 DIAGNOSIS — M542 Cervicalgia: Secondary | ICD-10-CM | POA: Diagnosis present

## 2022-02-01 DIAGNOSIS — I609 Nontraumatic subarachnoid hemorrhage, unspecified: Secondary | ICD-10-CM

## 2022-02-01 DIAGNOSIS — Z87891 Personal history of nicotine dependence: Secondary | ICD-10-CM | POA: Diagnosis not present

## 2022-02-01 DIAGNOSIS — S06890A Other specified intracranial injury without loss of consciousness, initial encounter: Secondary | ICD-10-CM | POA: Diagnosis not present

## 2022-02-01 DIAGNOSIS — E785 Hyperlipidemia, unspecified: Secondary | ICD-10-CM | POA: Diagnosis present

## 2022-02-01 DIAGNOSIS — E119 Type 2 diabetes mellitus without complications: Secondary | ICD-10-CM | POA: Diagnosis present

## 2022-02-01 DIAGNOSIS — Z9842 Cataract extraction status, left eye: Secondary | ICD-10-CM | POA: Diagnosis not present

## 2022-02-01 DIAGNOSIS — Z85828 Personal history of other malignant neoplasm of skin: Secondary | ICD-10-CM | POA: Diagnosis not present

## 2022-02-01 DIAGNOSIS — Z7982 Long term (current) use of aspirin: Secondary | ICD-10-CM | POA: Diagnosis not present

## 2022-02-01 DIAGNOSIS — S2242XA Multiple fractures of ribs, left side, initial encounter for closed fracture: Secondary | ICD-10-CM | POA: Diagnosis not present

## 2022-02-01 DIAGNOSIS — Z9841 Cataract extraction status, right eye: Secondary | ICD-10-CM | POA: Diagnosis not present

## 2022-02-01 DIAGNOSIS — Z9852 Vasectomy status: Secondary | ICD-10-CM | POA: Diagnosis not present

## 2022-02-01 HISTORY — PX: IR ANGIO VERTEBRAL SEL VERTEBRAL UNI R MOD SED: IMG5368

## 2022-02-01 HISTORY — PX: IR ANGIO INTRA EXTRACRAN SEL INTERNAL CAROTID BILAT MOD SED: IMG5363

## 2022-02-01 LAB — BASIC METABOLIC PANEL
Anion gap: 10 (ref 5–15)
BUN: 19 mg/dL (ref 8–23)
CO2: 20 mmol/L — ABNORMAL LOW (ref 22–32)
Calcium: 8.5 mg/dL — ABNORMAL LOW (ref 8.9–10.3)
Chloride: 106 mmol/L (ref 98–111)
Creatinine, Ser: 1.17 mg/dL (ref 0.61–1.24)
GFR, Estimated: >60 mL/min (ref >60)
Glucose, Bld: 124 mg/dL — ABNORMAL HIGH (ref 70–99)
Potassium: 4.4 mmol/L (ref 3.5–5.1)
Sodium: 136 mmol/L (ref 135–145)

## 2022-02-01 LAB — GLUCOSE, CAPILLARY
Glucose-Capillary: 107 mg/dL — ABNORMAL HIGH (ref 70–99)
Glucose-Capillary: 90 mg/dL (ref 70–99)
Glucose-Capillary: 126 mg/dL — ABNORMAL HIGH (ref 70–99)
Glucose-Capillary: 160 mg/dL — ABNORMAL HIGH (ref 70–99)
Glucose-Capillary: 179 mg/dL — ABNORMAL HIGH (ref 70–99)

## 2022-02-01 LAB — MRSA NEXT GEN BY PCR, NASAL: MRSA by PCR Next Gen: NOT DETECTED

## 2022-02-01 LAB — CBC
HCT: 48.1 % (ref 39.0–52.0)
Hemoglobin: 17.4 g/dL — ABNORMAL HIGH (ref 13.0–17.0)
MCH: 33.3 pg (ref 26.0–34.0)
MCHC: 36.2 g/dL — ABNORMAL HIGH (ref 30.0–36.0)
MCV: 92 fL (ref 80.0–100.0)
Platelets: 196 10*3/uL (ref 150–400)
RBC: 5.23 MIL/uL (ref 4.22–5.81)
RDW: 13.1 % (ref 11.5–15.5)
WBC: 10.6 10*3/uL — ABNORMAL HIGH (ref 4.0–10.5)
nRBC: 0 % (ref 0.0–0.2)

## 2022-02-01 MED ORDER — MIDAZOLAM HCL 2 MG/2ML IJ SOLN
INTRAMUSCULAR | Status: AC
  Filled 2022-02-01: qty 2

## 2022-02-01 MED ORDER — OXYCODONE HCL 5 MG PO TABS
10.0000 mg | ORAL_TABLET | ORAL | Status: DC | PRN
  Administered 2022-02-01 – 2022-02-03 (×8): 10 mg via ORAL
  Filled 2022-02-01 (×8): qty 2

## 2022-02-01 MED ORDER — ACETAMINOPHEN 500 MG PO TABS
1000.0000 mg | ORAL_TABLET | Freq: Four times a day (QID) | ORAL | Status: DC
  Administered 2022-02-01 – 2022-02-03 (×10): 1000 mg via ORAL
  Filled 2022-02-01 (×10): qty 2

## 2022-02-01 MED ORDER — DOCUSATE SODIUM 100 MG PO CAPS
100.0000 mg | ORAL_CAPSULE | Freq: Two times a day (BID) | ORAL | Status: DC
  Administered 2022-02-01 – 2022-02-03 (×5): 100 mg via ORAL
  Filled 2022-02-01 (×5): qty 1

## 2022-02-01 MED ORDER — METHOCARBAMOL 500 MG PO TABS: 500.0000 mg | ORAL_TABLET | Freq: Three times a day (TID) | ORAL | Status: DC | PRN

## 2022-02-01 MED ORDER — METOPROLOL SUCCINATE ER 50 MG PO TB24
50.0000 mg | ORAL_TABLET | Freq: Every day | ORAL | Status: DC
  Administered 2022-02-01 – 2022-02-03 (×3): 50 mg via ORAL
  Filled 2022-02-01 (×3): qty 1

## 2022-02-01 MED ORDER — HYDRALAZINE HCL 20 MG/ML IJ SOLN: 10.0000 mg | INTRAMUSCULAR | Status: DC | PRN

## 2022-02-01 MED ORDER — INSULIN ASPART 100 UNIT/ML IJ SOLN
0.0000 [IU] | Freq: Three times a day (TID) | INTRAMUSCULAR | Status: DC
  Administered 2022-02-02: 3 [IU] via SUBCUTANEOUS
  Administered 2022-02-02 – 2022-02-03 (×3): 2 [IU] via SUBCUTANEOUS

## 2022-02-01 MED ORDER — PREGABALIN 100 MG PO CAPS
100.0000 mg | ORAL_CAPSULE | Freq: Every day | ORAL | Status: DC
  Filled 2022-02-01: qty 1

## 2022-02-01 MED ORDER — SODIUM CHLORIDE 0.9 % IV SOLN: INTRAVENOUS | Status: DC

## 2022-02-01 MED ORDER — ARIPIPRAZOLE 10 MG PO TABS: 5.0000 mg | ORAL_TABLET | Freq: Every day | ORAL | Status: DC

## 2022-02-01 MED ORDER — BISACODYL 10 MG RE SUPP: 10.0000 mg | Freq: Every day | RECTAL | Status: DC | PRN

## 2022-02-01 MED ORDER — VENLAFAXINE HCL 75 MG PO TABS: 225.0000 mg | ORAL_TABLET | Freq: Every day | ORAL | Status: DC

## 2022-02-01 MED ORDER — LOSARTAN POTASSIUM 50 MG PO TABS
100.0000 mg | ORAL_TABLET | Freq: Every day | ORAL | Status: DC
  Administered 2022-02-01 – 2022-02-03 (×3): 100 mg via ORAL
  Filled 2022-02-01 (×3): qty 2

## 2022-02-01 MED ORDER — HYDROMORPHONE HCL 1 MG/ML IJ SOLN
0.5000 mg | INTRAMUSCULAR | Status: DC | PRN
  Administered 2022-02-01 (×2): 0.5 mg via INTRAVENOUS
  Filled 2022-02-01 (×2): qty 1

## 2022-02-01 MED ORDER — OXYCODONE HCL 5 MG PO TABS
5.0000 mg | ORAL_TABLET | ORAL | Status: DC | PRN
  Administered 2022-02-01: 5 mg via ORAL
  Filled 2022-02-01: qty 1

## 2022-02-01 MED ORDER — ONDANSETRON 4 MG PO TBDP: 4.0000 mg | ORAL_TABLET | Freq: Four times a day (QID) | ORAL | Status: DC | PRN

## 2022-02-01 MED ORDER — ORAL CARE MOUTH RINSE: 15.0000 mL | OROMUCOSAL | Status: DC | PRN

## 2022-02-01 MED ORDER — PREGABALIN 100 MG PO CAPS
200.0000 mg | ORAL_CAPSULE | Freq: Two times a day (BID) | ORAL | Status: DC
  Administered 2022-02-01 – 2022-02-02 (×2): 200 mg via ORAL
  Filled 2022-02-01 (×2): qty 2

## 2022-02-01 MED ORDER — PREGABALIN 50 MG PO CAPS
200.0000 mg | ORAL_CAPSULE | Freq: Two times a day (BID) | ORAL | Status: DC
  Administered 2022-02-01: 200 mg via ORAL
  Filled 2022-02-01: qty 4

## 2022-02-01 MED ORDER — IOHEXOL 300 MG/ML  SOLN
100.0000 mL | Freq: Once | INTRAMUSCULAR | Status: AC | PRN
  Administered 2022-02-01: 64 mL via INTRA_ARTERIAL

## 2022-02-01 MED ORDER — HYDROMORPHONE HCL 1 MG/ML IJ SOLN: 1.0000 mg | Freq: Once | INTRAMUSCULAR | Status: DC

## 2022-02-01 MED ORDER — LEVETIRACETAM IN NACL 500 MG/100ML IV SOLN
500.0000 mg | Freq: Two times a day (BID) | INTRAVENOUS | Status: DC
  Administered 2022-02-01 (×2): 500 mg via INTRAVENOUS
  Filled 2022-02-01 (×3): qty 100

## 2022-02-01 MED ORDER — LEVETIRACETAM 500 MG PO TABS
500.0000 mg | ORAL_TABLET | Freq: Two times a day (BID) | ORAL | Status: DC
  Administered 2022-02-01 – 2022-02-03 (×4): 500 mg via ORAL
  Filled 2022-02-01 (×4): qty 1

## 2022-02-01 MED ORDER — TRAMADOL HCL 50 MG PO TABS
50.0000 mg | ORAL_TABLET | Freq: Four times a day (QID) | ORAL | Status: DC | PRN
  Administered 2022-02-01 – 2022-02-02 (×4): 50 mg via ORAL
  Filled 2022-02-01 (×4): qty 1

## 2022-02-01 MED ORDER — HEPARIN SODIUM (PORCINE) 1000 UNIT/ML IJ SOLN
INTRAMUSCULAR | Status: AC
  Filled 2022-02-01: qty 10

## 2022-02-01 MED ORDER — METHOCARBAMOL 1000 MG/10ML IJ SOLN: 500.0000 mg | Freq: Three times a day (TID) | INTRAVENOUS | Status: DC | PRN

## 2022-02-01 MED ORDER — METOPROLOL TARTRATE 5 MG/5ML IV SOLN: 5.0000 mg | Freq: Four times a day (QID) | INTRAVENOUS | Status: DC | PRN

## 2022-02-01 MED ORDER — LIDOCAINE HCL 1 % IJ SOLN
INTRAMUSCULAR | Status: AC
  Administered 2022-02-01: 10 mL
  Filled 2022-02-01: qty 20

## 2022-02-01 MED ORDER — ONDANSETRON HCL 4 MG/2ML IJ SOLN: 4.0000 mg | Freq: Four times a day (QID) | INTRAMUSCULAR | Status: DC | PRN

## 2022-02-01 MED ORDER — PREGABALIN 50 MG PO CAPS: 100.0000 mg | ORAL_CAPSULE | Freq: Every day | ORAL | Status: DC

## 2022-02-01 MED ORDER — FENTANYL CITRATE (PF) 100 MCG/2ML IJ SOLN
INTRAMUSCULAR | Status: AC
  Filled 2022-02-01: qty 2

## 2022-02-01 MED ORDER — HYDROMORPHONE HCL 1 MG/ML IJ SOLN
0.5000 mg | Freq: Once | INTRAMUSCULAR | Status: AC
  Administered 2022-02-01: 0.5 mg via INTRAVENOUS
  Filled 2022-02-01: qty 1

## 2022-02-01 MED ORDER — AMLODIPINE BESYLATE 5 MG PO TABS
5.0000 mg | ORAL_TABLET | Freq: Every day | ORAL | Status: DC
  Administered 2022-02-01 – 2022-02-03 (×3): 5 mg via ORAL
  Filled 2022-02-01 (×3): qty 1

## 2022-02-01 MED ORDER — CHLORHEXIDINE GLUCONATE CLOTH 2 % EX PADS
6.0000 | MEDICATED_PAD | Freq: Every day | CUTANEOUS | Status: DC
  Administered 2022-02-02 – 2022-02-03 (×2): 6 via TOPICAL
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
## 2022-02-01 NOTE — Brief Op Note (Signed)
  NEUROSURGERY BRIEF OPERATIVE  NOTE   PREOP DX: Subarachnoid Hemorrhage  POSTOP DX: Same  PROCEDURE: Diagnostic cerebral angiogram  SURGEON: Dr. Consuella Lose, MD  ANESTHESIA: IV Sedation with Local  EBL: Minimal  SPECIMENS: None  COMPLICATIONS: None  CONDITION: Stable to recovery  FINDINGS (Full report in [facility]): 1. Normal cerebral angiogram, no dissection, aneurysm, or fistula is identified.   Consuella Lose, MD [facility]

## 2022-02-01 NOTE — Progress Notes (Signed)
Pt. Refusing sliding scale insulin. CBG 160. Pt. Requesting to restart home metformin and Jardiance. Dr. Bobbye Morton notified of pt's request. Per MD, metformin contraindicated due to recent contrast administration and Jardiance contraindicated due to risk for hypoglycemia during acute hospitalization.   Pt. Notified of inability to restart home medications at this time. Importance of maintaining glycemic control emphasized to patient. Patient would like to hold off on receiving insulin for fear of "going on the needle," and restart PO meds when able to. Dr. Bobbye Morton notified of patient declining insulin.

## 2022-02-01 NOTE — H&P (Signed)
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 small cyst, postsurgical changes of the lower thoracic spine with spinal stimulator in place, T11-12 calcified disc protrusion with mild compression of the left hemicord, evidence of previous decompression laminectomy, cervical posterior canal neural stimulator CTA findings: 2 mm focal outpouching extending inferiorly from the supraclinoid right ICA, indeterminate could reflect small aneurysm versus vascular infundibulum, otherwise negative intracranial CTA     Patient Active Problem List   Diagnosis Date Noted   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[place]) 03/27/2021   Opioid-induced hyperalgesia 03/27/2021   Spinal cord stimulator status 03/27/2021   History of lumbar laminectomy 03/27/2021   Moderate episode of recurrent major depressive disorder ([place]) 03/27/2021   Infected ulcer of skin ([place]) 04/17/2019   ASCVD (arteriosclerotic cardiovascular disease) 02/22/2019   DDD (degenerative disc disease), cervical 02/25/2016   S/P cervical spinal fusion 02/25/2016   Cervical facet syndrome 02/25/2016   DDD (degenerative disc disease), lumbar 02/25/2016    Facet syndrome, lumbar  02/25/2016   Sacroiliac joint dysfunction 02/25/2016   Atherosclerotic peripheral vascular disease ([place]) 02/25/2016   Abnormal kidney function 12/06/2014   Cervical nerve root disorder 12/06/2014   Colon polyp 12/06/2014   Clinical depression 12/06/2014   Essential (primary) hypertension 12/06/2014   Cephalalgia 12/06/2014   Bulge of cervical disc without myelopathy 12/06/2014   HLD (hyperlipidemia) 12/06/2014   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   Displacement of lumbar intervertebral disc without myelopathy 12/06/2014   L-S radiculopathy 12/06/2014   Mild major depression ([place]) 12/06/2014   Neuropathy 12/06/2014   Adiposity 12/06/2014   Peripheral vascular disease ([place]) 12/06/2014   Diabetes mellitus, type 2 ([place]) 12/06/2014   Cervical post-laminectomy syndrome 10/04/2013   Chronic neck pain 10/04/2013   Peripheral neuropathic pain 10/04/2013   Fatigue 03/17/2013   Chest pain 01/28/2013   Seizures ([place]) 01/28/2013   Syncope 01/28/2013   Chronic pain syndrome 08/17/2012   Polypharmacy 08/02/2012   Bernhardt's paresthesia 08/02/2012   Post laminectomy syndrome 08/02/2012   Thoracic spinal stenosis 06/22/2011       Romana Juniper, [facility]  See AMION to contact appropriate on-call provider

## 2022-02-01 NOTE — Progress Notes (Signed)
Orthopedic Tech Progress Note Patient Details:  Jeff [name] [birth year] [id number]  Patient ID: [name], male   DOB: May 31, 1961, 61 y.o.   MRN: [id number] I attended trauma page Karolee Stamps 02/01/2022, 3:11 AM
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
## 2022-02-01 NOTE — Progress Notes (Signed)
Patient seen and examined. GCS15, neurointact. 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 Kathyrn Sheriff.   Jesusita Oka, MD [facility]
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
## 2022-02-02 LAB — CBC
HCT: 43.7 % (ref 39.0–52.0)
Hemoglobin: 15.2 g/dL (ref 13.0–17.0)
MCH: 32.5 pg (ref 26.0–34.0)
MCHC: 34.8 g/dL (ref 30.0–36.0)
MCV: 93.6 fL (ref 80.0–100.0)
Platelets: 161 10*3/uL (ref 150–400)
RBC: 4.67 MIL/uL (ref 4.22–5.81)
RDW: 13.2 % (ref 11.5–15.5)
WBC: 6.8 10*3/uL (ref 4.0–10.5)
nRBC: 0 % (ref 0.0–0.2)

## 2022-02-02 LAB — BASIC METABOLIC PANEL
Anion gap: 5 (ref 5–15)
BUN: 22 mg/dL (ref 8–23)
CO2: 22 mmol/L (ref 22–32)
Calcium: 7.9 mg/dL — ABNORMAL LOW (ref 8.9–10.3)
Chloride: 109 mmol/L (ref 98–111)
Creatinine, Ser: 1.14 mg/dL (ref 0.61–1.24)
GFR, Estimated: >60 mL/min (ref >60)
Glucose, Bld: 225 mg/dL — ABNORMAL HIGH (ref 70–99)
Potassium: 4.1 mmol/L (ref 3.5–5.1)
Sodium: 136 mmol/L (ref 135–145)

## 2022-02-02 LAB — GLUCOSE, CAPILLARY
Glucose-Capillary: 123 mg/dL — ABNORMAL HIGH (ref 70–99)
Glucose-Capillary: 185 mg/dL — ABNORMAL HIGH (ref 70–99)
Glucose-Capillary: 133 mg/dL — ABNORMAL HIGH (ref 70–99)
Glucose-Capillary: 190 mg/dL — ABNORMAL HIGH (ref 70–99)

## 2022-02-02 MED ORDER — PREGABALIN 100 MG PO CAPS
200.0000 mg | ORAL_CAPSULE | Freq: Every day | ORAL | Status: DC
  Administered 2022-02-03: 200 mg via ORAL
  Filled 2022-02-02: qty 2

## 2022-02-02 MED ORDER — METHOCARBAMOL 1000 MG/10ML IJ SOLN
1000.0000 mg | Freq: Three times a day (TID) | INTRAVENOUS | Status: DC
  Administered 2022-02-02 – 2022-02-03 (×3): 1000 mg via INTRAVENOUS
  Filled 2022-02-02 (×7): qty 10

## 2022-02-02 NOTE — Progress Notes (Signed)
Pt continues to refuse subq insulin. Educated on the importance of treating blood glucose and the risks of hyperglycemia.
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
## 2022-02-02 NOTE — Progress Notes (Addendum)
While bathing patient, two ticks noted and removed from patient with heads in tact. One from right anterior shin and one from right flank. Patient states he is frequently out in the woods and finds/removes several ticks from himself weekly.

## 2022-02-03 LAB — BASIC METABOLIC PANEL
Anion gap: 7 (ref 5–15)
BUN: 17 mg/dL (ref 8–23)
CO2: 23 mmol/L (ref 22–32)
Calcium: 8.5 mg/dL — ABNORMAL LOW (ref 8.9–10.3)
Chloride: 108 mmol/L (ref 98–111)
Creatinine, Ser: 1.03 mg/dL (ref 0.61–1.24)
GFR, Estimated: >60 mL/min (ref >60)
Glucose, Bld: 115 mg/dL — ABNORMAL HIGH (ref 70–99)
Potassium: 4.2 mmol/L (ref 3.5–5.1)
Sodium: 138 mmol/L (ref 135–145)

## 2022-02-03 LAB — CBC
HCT: 45.7 % (ref 39.0–52.0)
Hemoglobin: 15.9 g/dL (ref 13.0–17.0)
MCH: 32.4 pg (ref 26.0–34.0)
MCHC: 34.8 g/dL (ref 30.0–36.0)
MCV: 93.3 fL (ref 80.0–100.0)
Platelets: 162 10*3/uL (ref 150–400)
RBC: 4.9 MIL/uL (ref 4.22–5.81)
RDW: 13.2 % (ref 11.5–15.5)
WBC: 7.6 10*3/uL (ref 4.0–10.5)
nRBC: 0 % (ref 0.0–0.2)

## 2022-02-03 LAB — GLUCOSE, CAPILLARY
Glucose-Capillary: 144 mg/dL — ABNORMAL HIGH (ref 70–99)
Glucose-Capillary: 127 mg/dL — ABNORMAL HIGH (ref 70–99)

## 2022-02-03 MED ORDER — ACETAMINOPHEN 500 MG PO TABS: 500.0000 mg | ORAL_TABLET | Freq: Four times a day (QID) | ORAL | 0 refills | Status: AC | PRN

## 2022-02-03 MED ORDER — LEVETIRACETAM 500 MG PO TABS: 500.0000 mg | ORAL_TABLET | Freq: Two times a day (BID) | ORAL | 0 refills | Status: AC

## 2022-02-03 MED ORDER — OXYCODONE HCL 5 MG PO TABS: 5.0000 mg | ORAL_TABLET | Freq: Four times a day (QID) | ORAL | 0 refills | Status: DC | PRN
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
## 2022-02-03 NOTE — Progress Notes (Signed)
Discharge instructions given to patient, questions answered. IVs removed. Patient taken to car to meet family via wheelchair, all belongings with patient at time of discharge.

## 2022-02-09 ENCOUNTER — Encounter (HOSPITAL_COMMUNITY): Payer: Self-pay

## 2022-02-09 ENCOUNTER — Other Ambulatory Visit: Payer: Self-pay

## 2022-02-09 ENCOUNTER — Emergency Department (HOSPITAL_COMMUNITY): Payer: Medicare Other

## 2022-02-09 ENCOUNTER — Emergency Department (HOSPITAL_COMMUNITY)
Admission: EM | Admit: 2022-02-09 | Discharge: 2022-02-09 | Disposition: A | Discharge status: Home / Self Care | Payer: Medicare Other | Attending: Emergency Medicine | Admitting: Emergency Medicine

## 2022-02-09 DIAGNOSIS — E119 Type 2 diabetes mellitus without complications: Secondary | ICD-10-CM | POA: Insufficient documentation

## 2022-02-09 DIAGNOSIS — Z7984 Long term (current) use of oral hypoglycemic drugs: Secondary | ICD-10-CM | POA: Diagnosis not present

## 2022-02-09 DIAGNOSIS — I10 Essential (primary) hypertension: Secondary | ICD-10-CM | POA: Insufficient documentation

## 2022-02-09 DIAGNOSIS — R0789 Other chest pain: Secondary | ICD-10-CM | POA: Insufficient documentation

## 2022-02-09 DIAGNOSIS — R519 Headache, unspecified: Secondary | ICD-10-CM | POA: Diagnosis not present

## 2022-02-09 DIAGNOSIS — Z79899 Other long term (current) drug therapy: Secondary | ICD-10-CM | POA: Diagnosis not present

## 2022-02-09 DIAGNOSIS — Z7982 Long term (current) use of aspirin: Secondary | ICD-10-CM | POA: Insufficient documentation

## 2022-02-09 LAB — COMPREHENSIVE METABOLIC PANEL
ALT: 25 U/L (ref 0–44)
AST: 21 U/L (ref 15–41)
Albumin: 3.9 g/dL (ref 3.5–5.0)
Alkaline Phosphatase: 63 U/L (ref 38–126)
Anion gap: 9 (ref 5–15)
BUN: 12 mg/dL (ref 8–23)
CO2: 24 mmol/L (ref 22–32)
Calcium: 8.9 mg/dL (ref 8.9–10.3)
Chloride: 106 mmol/L (ref 98–111)
Creatinine, Ser: 0.99 mg/dL (ref 0.61–1.24)
GFR, Estimated: >60 mL/min (ref >60)
Glucose, Bld: 127 mg/dL — ABNORMAL HIGH (ref 70–99)
Potassium: 3.8 mmol/L (ref 3.5–5.1)
Sodium: 139 mmol/L (ref 135–145)
Total Bilirubin: 0.8 mg/dL (ref 0.3–1.2)
Total Protein: 7.3 g/dL (ref 6.5–8.1)

## 2022-02-09 LAB — CBC WITH DIFFERENTIAL/PLATELET
Abs Immature Granulocytes: 0.02 10*3/uL (ref 0.00–0.07)
Basophils Absolute: 0 10*3/uL (ref 0.0–0.1)
Basophils Relative: 1 %
Eosinophils Absolute: 0.1 10*3/uL (ref 0.0–0.5)
Eosinophils Relative: 1 %
HCT: 49 % (ref 39.0–52.0)
Hemoglobin: 17.1 g/dL — ABNORMAL HIGH (ref 13.0–17.0)
Immature Granulocytes: 0 %
Lymphocytes Relative: 24 %
Lymphs Abs: 1.7 10*3/uL (ref 0.7–4.0)
MCH: 32.9 pg (ref 26.0–34.0)
MCHC: 34.9 g/dL (ref 30.0–36.0)
MCV: 94.2 fL (ref 80.0–100.0)
Monocytes Absolute: 0.5 10*3/uL (ref 0.1–1.0)
Monocytes Relative: 7 %
Neutro Abs: 4.6 10*3/uL (ref 1.7–7.7)
Neutrophils Relative %: 67 %
Platelets: 225 10*3/uL (ref 150–400)
RBC: 5.2 MIL/uL (ref 4.22–5.81)
RDW: 13 % (ref 11.5–15.5)
WBC: 7 10*3/uL (ref 4.0–10.5)
nRBC: 0 % (ref 0.0–0.2)

## 2022-02-09 LAB — PROTIME-INR
INR: 1 (ref 0.8–1.2)
Prothrombin Time: 13.5 seconds (ref 11.4–15.2)

## 2022-02-09 LAB — MAGNESIUM: Magnesium: 1.9 mg/dL (ref 1.7–2.4)

## 2022-02-09 MED ORDER — METHOCARBAMOL 500 MG PO TABS
500.0000 mg | ORAL_TABLET | Freq: Once | ORAL | Status: AC
  Administered 2022-02-09: 500 mg via ORAL
  Filled 2022-02-09: qty 1

## 2022-02-09 MED ORDER — ACETAMINOPHEN 325 MG PO TABS
650.0000 mg | ORAL_TABLET | Freq: Once | ORAL | Status: AC
  Administered 2022-02-09: 650 mg via ORAL
  Filled 2022-02-09: qty 2

## 2022-02-09 MED ORDER — OXYCODONE HCL 5 MG PO TABS: 5.0000 mg | ORAL_TABLET | Freq: Four times a day (QID) | ORAL | 0 refills | Status: DC | PRN

## 2022-02-09 MED ORDER — METOCLOPRAMIDE HCL 5 MG/ML IJ SOLN
10.0000 mg | Freq: Once | INTRAMUSCULAR | Status: AC
  Administered 2022-02-09: 10 mg via INTRAVENOUS
  Filled 2022-02-09: qty 2

## 2022-02-09 MED ORDER — DIPHENHYDRAMINE HCL 50 MG/ML IJ SOLN
12.5000 mg | Freq: Once | INTRAMUSCULAR | Status: AC
  Administered 2022-02-09: 12.5 mg via INTRAVENOUS
  Filled 2022-02-09: qty 1

## 2022-02-09 MED ORDER — METOCLOPRAMIDE HCL 10 MG PO TABS: 10.0000 mg | ORAL_TABLET | Freq: Three times a day (TID) | ORAL | 0 refills | Status: DC | PRN

## 2022-02-09 MED ORDER — LACTATED RINGERS IV BOLUS
500.0000 mL | Freq: Once | INTRAVENOUS | Status: AC
Start: 2022-02-09 — End: 2022-02-09
  Administered 2022-02-09: 500 mL via INTRAVENOUS

## 2022-02-09 MED ORDER — IOHEXOL 350 MG/ML SOLN
75.0000 mL | Freq: Once | INTRAVENOUS | Status: AC | PRN
  Administered 2022-02-09: 75 mL via INTRAVENOUS

## 2022-02-09 MED ORDER — LACTATED RINGERS IV BOLUS
500.0000 mL | Freq: Once | INTRAVENOUS | Status: AC
  Administered 2022-02-09: 500 mL via INTRAVENOUS

## 2022-02-09 MED ORDER — PROCHLORPERAZINE EDISYLATE 10 MG/2ML IJ SOLN
10.0000 mg | Freq: Once | INTRAMUSCULAR | Status: AC
  Administered 2022-02-09: 10 mg via INTRAVENOUS
  Filled 2022-02-09: qty 2

## 2022-02-09 NOTE — ED Triage Notes (Signed)
Pt arrived POV from home c/o a headache since 7/4 after being discharged with a subarachnoid hemorrhage. Pt states it has gotten gradually worse over the last 2 days.
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
## 2022-02-09 NOTE — ED Notes (Signed)
Pt given turkey sandwich and a drink. ?

## 2022-02-09 NOTE — Discharge Instructions (Signed)
Continue to take Tylenol every 6 hours.  There were additional medications that were sent to pharmacy.  Reglan is a medication that can help headaches and nausea.  Take this as needed for severe headache.  Additionally, a new prescription for oxycodone was sent.  This is a narcotic medication that can treat pain.  This is more for your rib pain.  Take only as needed.  Follow-up as scheduled with the neurosurgery team.  Return to the emergency department at any time for new or worsening severity of symptoms.

## 2022-02-10 ENCOUNTER — Ambulatory Visit: Payer: Medicare Other | Attending: Otolaryngology

## 2022-02-10 DIAGNOSIS — G4730 Sleep apnea, unspecified: Secondary | ICD-10-CM | POA: Diagnosis not present

## 2022-02-10 DIAGNOSIS — R0683 Snoring: Secondary | ICD-10-CM | POA: Diagnosis not present

## 2022-02-10 DIAGNOSIS — G4733 Obstructive sleep apnea (adult) (pediatric): Secondary | ICD-10-CM | POA: Diagnosis present

## 2022-02-12 ENCOUNTER — Telehealth (HOSPITAL_COMMUNITY): Payer: Self-pay | Admitting: Emergency Medicine

## 2022-02-12 MED ORDER — OXYCODONE HCL 5 MG PO TABS: 5.0000 mg | ORAL_TABLET | Freq: Four times a day (QID) | ORAL | 0 refills | Status: DC | PRN

## 2022-02-12 NOTE — Telephone Encounter (Signed)
Pharmacy called and request resend prescription

## 2022-02-13 ENCOUNTER — Other Ambulatory Visit: Payer: Self-pay | Admitting: Physician Assistant

## 2022-02-13 ENCOUNTER — Other Ambulatory Visit: Payer: Self-pay | Admitting: Family Medicine

## 2022-02-13 DIAGNOSIS — E785 Hyperlipidemia, unspecified: Secondary | ICD-10-CM

## 2022-02-13 DIAGNOSIS — I10 Essential (primary) hypertension: Secondary | ICD-10-CM

## 2022-02-13 NOTE — Telephone Encounter (Signed)
Requested Prescriptions  Pending Prescriptions Disp Refills  . rosuvastatin (CRESTOR) 40 MG tablet [Pharmacy Med Name: ROSUVASTATIN CALCIUM 40 MG TAB] 90 tablet 1    Sig: Take 1 tablet (40 mg total) by mouth daily.     Cardiovascular:  Antilipid - Statins 2 Failed - 02/13/2022  2:27 AM      Failed - Lipid Panel in normal range within the last 12 months    Cholesterol, Total  Date Value Ref Range Status  03/25/2021 130 100 - 199 mg/dL Final   LDL Cholesterol (Calc)  Date Value Ref Range Status  05/05/2017 69 mg/dL (calc) Final    Comment:    Reference range: <100 . Desirable range <100 mg/dL for primary prevention;   <70 mg/dL for patients with CHD or diabetic patients  with > or = 2 CHD risk factors. Marland Kitchen LDL-C is now calculated using the Martin-Hopkins  calculation, which is a validated novel method providing  better accuracy than the Friedewald equation in the  estimation of LDL-C.  Cresenciano Genre et al. Annamaria Helling. 5790;383(33): 2061-2068  (http://education.[employer].com/faq/FAQ164)    LDL Chol Calc (NIH)  Date Value Ref Range Status  03/25/2021 75 0 - 99 mg/dL Final   HDL  Date Value Ref Range Status  03/25/2021 29 (L) >39 mg/dL Final   Triglycerides  Date Value Ref Range Status  03/25/2021 148 0 - 149 mg/dL Final         Passed - Cr in normal range and within 360 days    Creat  Date Value Ref Range Status  05/05/2017 0.91 0.70 - 1.33 mg/dL Final    Comment:    For patients >75 years of age, the reference limit for Creatinine is approximately 13% higher for people identified as African-American. .    Creatinine, Ser  Date Value Ref Range Status  02/09/2022 0.99 0.61 - 1.24 mg/dL Final         Passed - Patient is not pregnant      Passed - Valid encounter within last 12 months    Recent Outpatient Visits          3 weeks ago Essential (primary) hypertension   [facility] Jerrol Banana., MD   2 months ago OSA (obstructive sleep  apnea)   [facility] Jerrol Banana., MD   4 months ago Essential (primary) hypertension   [facility] Jerrol Banana., MD   5 months ago Type 2 diabetes mellitus with diabetic polyneuropathy, without long-term current use of insulin [facility])   [place], [place]   10 months ago Type 2 diabetes mellitus with diabetic polyneuropathy, without long-term current use of insulin [facility])   [facility] Jerrol Banana., MD      Future Appointments            In 3 months Jerrol Banana., MD [facility] [place]   In 4 months Brendolyn Patty, MD [place]

## 2022-02-13 NOTE — Telephone Encounter (Signed)
Requested Prescriptions  Pending Prescriptions Disp Refills  . losartan (COZAAR) 100 MG tablet [Pharmacy Med Name: LOSARTAN POTASSIUM 100 MG TAB] 30 tablet 5    Sig: TAKE 1 TABLET BY MOUTH EVERY DAY     Cardiovascular:  Angiotensin Receptor Blockers Passed - 02/13/2022  9:11 AM      Passed - Cr in normal range and within 180 days    Creat  Date Value Ref Range Status  05/05/2017 0.91 0.70 - 1.33 mg/dL Final    Comment:    For patients >63 years of age, the reference limit for Creatinine is approximately 13% higher for people identified as African-American. .    Creatinine, Ser  Date Value Ref Range Status  02/09/2022 0.99 0.61 - 1.24 mg/dL Final         Passed - K in normal range and within 180 days    Potassium  Date Value Ref Range Status  02/09/2022 3.8 3.5 - 5.1 mmol/L Final  12/28/2012 4.1 3.5 - 5.1 mmol/L Final         Passed - Patient is not pregnant      Passed - Last BP in normal range    BP Readings from Last 1 Encounters:  02/09/22 120/77         Passed - Valid encounter within last 6 months    Recent Outpatient Visits          3 weeks ago Essential (primary) hypertension   [facility] Jerrol Banana., MD   2 months ago OSA (obstructive sleep apnea)   [facility] Jerrol Banana., MD   4 months ago Essential (primary) hypertension   [facility] Jerrol Banana., MD   5 months ago Type 2 diabetes mellitus with diabetic polyneuropathy, without long-term current use of insulin [facility])   [place], DO   10 months ago Type 2 diabetes mellitus with diabetic polyneuropathy, without long-term current use of insulin [facility])   [facility] Jerrol Banana., MD      Future Appointments            In 3 months Jerrol Banana., MD [facility] [place]   In 4 months Brendolyn Patty, MD [place]

## 2022-02-16 ENCOUNTER — Other Ambulatory Visit: Payer: Self-pay | Admitting: Neurosurgery

## 2022-02-16 DIAGNOSIS — I609 Nontraumatic subarachnoid hemorrhage, unspecified: Secondary | ICD-10-CM

## 2022-02-17 ENCOUNTER — Ambulatory Visit: Payer: Medicare Other | Admitting: Podiatry

## 2022-02-17 DIAGNOSIS — B351 Tinea unguium: Secondary | ICD-10-CM

## 2022-02-17 DIAGNOSIS — M79675 Pain in left toe(s): Secondary | ICD-10-CM | POA: Diagnosis not present

## 2022-02-17 DIAGNOSIS — E0843 Diabetes mellitus due to underlying condition with diabetic autonomic (poly)neuropathy: Secondary | ICD-10-CM

## 2022-02-17 DIAGNOSIS — M79674 Pain in right toe(s): Secondary | ICD-10-CM | POA: Diagnosis not present
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
## 2022-02-18 ENCOUNTER — Ambulatory Visit (INDEPENDENT_AMBULATORY_CARE_PROVIDER_SITE_OTHER): Payer: Medicare Other | Admitting: Family Medicine

## 2022-02-18 ENCOUNTER — Encounter: Payer: Self-pay | Admitting: Family Medicine

## 2022-02-18 DIAGNOSIS — S2242XA Multiple fractures of ribs, left side, initial encounter for closed fracture: Secondary | ICD-10-CM

## 2022-02-18 DIAGNOSIS — S066X9A Traumatic subarachnoid hemorrhage with loss of consciousness of unspecified duration, initial encounter: Secondary | ICD-10-CM

## 2022-02-18 DIAGNOSIS — R55 Syncope and collapse: Secondary | ICD-10-CM | POA: Diagnosis not present

## 2022-02-18 DIAGNOSIS — F3341 Major depressive disorder, recurrent, in partial remission: Secondary | ICD-10-CM

## 2022-02-18 DIAGNOSIS — E1142 Type 2 diabetes mellitus with diabetic polyneuropathy: Secondary | ICD-10-CM

## 2022-02-18 DIAGNOSIS — I70209 Unspecified atherosclerosis of native arteries of extremities, unspecified extremity: Secondary | ICD-10-CM | POA: Diagnosis not present

## 2022-02-18 DIAGNOSIS — F1129 Opioid dependence with unspecified opioid-induced disorder: Secondary | ICD-10-CM

## 2022-02-18 DIAGNOSIS — G894 Chronic pain syndrome: Secondary | ICD-10-CM
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
## 2022-02-24 ENCOUNTER — Ambulatory Visit
Payer: Medicare Other | Attending: Student in an Organized Health Care Education/Training Program | Admitting: Student in an Organized Health Care Education/Training Program

## 2022-02-24 ENCOUNTER — Encounter: Payer: Self-pay | Admitting: Student in an Organized Health Care Education/Training Program

## 2022-02-24 VITALS — BP 158/84 | HR 71 | Temp 97.5°F | Resp 16 | Ht 68.0 in | Wt 209.8 lb

## 2022-02-24 DIAGNOSIS — M5010 Cervical disc disorder with radiculopathy, unspecified cervical region: Secondary | ICD-10-CM | POA: Insufficient documentation

## 2022-02-24 DIAGNOSIS — G894 Chronic pain syndrome: Secondary | ICD-10-CM | POA: Diagnosis not present

## 2022-02-24 DIAGNOSIS — M961 Postlaminectomy syndrome, not elsewhere classified: Secondary | ICD-10-CM | POA: Insufficient documentation

## 2022-02-24 DIAGNOSIS — Z9689 Presence of other specified functional implants: Secondary | ICD-10-CM | POA: Diagnosis not present

## 2022-02-24 DIAGNOSIS — M170 Bilateral primary osteoarthritis of knee: Secondary | ICD-10-CM | POA: Insufficient documentation

## 2022-02-24 DIAGNOSIS — I2510 Atherosclerotic heart disease of native coronary artery without angina pectoris: Secondary | ICD-10-CM | POA: Diagnosis not present

## 2022-02-24 DIAGNOSIS — M47812 Spondylosis without myelopathy or radiculopathy, cervical region: Secondary | ICD-10-CM | POA: Diagnosis not present

## 2022-02-24 DIAGNOSIS — Z9889 Other specified postprocedural states: Secondary | ICD-10-CM | POA: Insufficient documentation

## 2022-02-24 DIAGNOSIS — E1140 Type 2 diabetes mellitus with diabetic neuropathy, unspecified: Secondary | ICD-10-CM | POA: Insufficient documentation

## 2022-02-24 DIAGNOSIS — E1142 Type 2 diabetes mellitus with diabetic polyneuropathy: Secondary | ICD-10-CM | POA: Diagnosis not present

## 2022-02-24 DIAGNOSIS — Z981 Arthrodesis status: Secondary | ICD-10-CM | POA: Insufficient documentation

## 2022-02-24 NOTE — Progress Notes (Signed)
Safety precautions to be maintained throughout the outpatient stay will include: orient to surroundings, keep bed in low position, maintain call bell within reach at all times, provide assistance with transfer out of bed and ambulation.  

## 2022-02-24 NOTE — Progress Notes (Signed)
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 seen in the subcutaneous tissues immediately posterior to the thecal sac at the site of the posterior laminectomies and resection of the spinous processes.  The thecal sac and spinal canal are well decompressed.  Again noted is a small disc protrusion just to the left of midline at T11-12, unchanged.  There is no myelopathy or mass or significant compression of the spinal cord.  Neural foramina are widely patent.  Tip of the conus is well visualized and is normal.  The remainder of the thoracic spine is unchanged and normal.  IMPRESSION: Expected fluid collections at the operative site at T10-11 and T11- 12.  Thecal sac is well decompressed.  No change in the small disc protrusion at T11-12.  No significant impingement.  Original Report Authenticated By: JLarey Seat  M.D.   Narrative CLINICAL DATA:  Bilateral low back pain with sciatica unspecified. Neck pain. Multiple falls. Cervical fusion 2012. Spinal cord stimulator.  EXAM: CT CERVICAL, THORACIC, AND LUMBAR SPINE WITHOUT CONTRAST  TECHNIQUE: Multidetector CT imaging of the cervical, thoracic and lumbar spine was performed without intravenous contrast. Multiplanar CT image reconstructions were also generated.  COMPARISON:  MRI lumbar spine 07/03/2015.  FINDINGS: CT CERVICAL SPINE FINDINGS  ACDF at C6-7. Anterior plate and screws in good position. Solid fusion. Normal alignment. Negative for fracture. Negative for bony or soft tissue mass.  C2-3:  Negative  C3-4: Disc degeneration with mild uncinate spurring. No significant stenosis  C4-5: Mild disc degeneration and mild uncinate spurring. Bilateral facet hypertrophy. No significant spinal or foraminal stenosis.  C5-6: Mild disc degeneration and mild uncinate spurring without stenosis  C6-7 surgical fusion which appears solid. No significant spinal or foraminal stenosis  C7-T1: Negative  CT THORACIC SPINE FINDINGS  Thoracic alignment is normal. Negative for fracture or mass. Bilateral laminectomy at T10 and T11. Spinal cord stimulator extends  to the T7-8 level posteriorly in good position.  No significant spinal stenosis. Mild degenerative changes. Mild facet degeneration is present bilaterally at T2-3 and T3-4. Disc degeneration and calcified disc protrusion on the left at T11-12. Adequate posterior decompression at this level. No significant spinal stenosis  Visualized lung parenchyma is negative for acute abnormality.  CT LUMBAR SPINE FINDINGS  Normal lumbar alignment. Negative for fracture or mass. No acute bony abnormality. SI joints are normal. Adjacent soft tissues are without acute abnormality. Spinal cord stimulator generator pack is noted in the subcutaneous tissues of the left lumbar spine.  Mild atherosclerotic calcification in the abdominal aorta without aneurysm.  L1-2:  Mild disc and facet degeneration without spinal stenosis  L2-3: Diffuse disc bulging. Bilateral facet hypertrophy. Mild spinal stenosis.  L3-4: Diffuse disc bulging. Bilateral facet hypertrophy. Mild spinal stenosis.  L4-5: Diffuse bulging of the disc. Bilateral facet and ligamentum flavum hypertrophy. Mild spinal stenosis. Neural foramina adequately patent  L5-S1: Mild disc degeneration. Moderate facet hypertrophy without significant spinal or foraminal stenosis.  IMPRESSION: Surgical fusion at C6-7. Multilevel cervical degenerative change without significant spinal stenosis  Spinal cord stimulator at T7-8 in good position. Bilateral laminectomy T10 and T11. Calcified chronic disc protrusion on the left at T11-T12. Thoracic facet degeneration as above  Lumbar degenerative changes as above. Mild spinal stenosis at L2-3, L3-4, and L4-5 due to disc and facet degeneration  No acute spinal abnormality.   Electronically Signed By: Franchot Gallo M.D. On: 05/17/2015 16:13  Narrative *RADIOLOGY REPORT*  Clinical Data: T10-12 laminectomy and microdiskectomy.  THORACOLUMBAR SPINE - 2 VIEW  Comparison: Thoracic spine MR 03/23/2011.  Findings: Two intraoperative cross-table lateral views of the thoracolumbar spine are submitted.  The first film, taken at 0955 hours, shows a surgical instrument tip projecting anterior to the T11 vertebral body.  Multilevel endplate degenerative changes are seen in the spine.  The second film, taken at 1005 hours, shows a surgical instrument tip projecting in the soft tissues posterior to the T11-12 disc space.  IMPRESSION: Intraoperative visualization for T10-12 laminectomy and microdiskectomy.  Original Report Authenticated By: Luretha Rued, M.D.  Thoracic DG w/swimmers view: No results found for this or any previous visit.  Thoracic DG  Myelogram views: No results found for this or any previous visit.  Thoracic DG Myelogram views: No results found for this or any previous visit.   Lumbosacral Imaging:   Narrative *RADIOLOGY REPORT*  Clinical Data: Bilateral lower extremity pain and low back pain. Right leg tenderness, progressive.  MRI LUMBAR SPINE WITHOUT CONTRAST  Technique:  Multiplanar and multiecho pulse sequences of the lumbar spine were obtained without intravenous contrast.  Comparison: Lumbar MRI dated 05/23/2011  Findings: The scan extends from T11-12 through S2. Tip of the conus is at T12 and appears normal.  There has been no change in the appearance of the lumbar spine since the prior exam.  No abnormality from T12-L1 through L4-5. Small annular tear and central disc bulge at L5-S1 without impingement.  Paraspinal soft tissues are normal.  The operative site at the T10-11 and T11-12 is described on the MRI of the thoracic spine performed on this same day.  IMPRESSION: Stable appearance of the lumbar spine with no significant abnormalities.  No findings to explain this patient's right leg symptoms.  Original Report Authenticated By: Larey Seat, M.D.   Narrative CLINICAL DATA:  Lumbar plexopathy, traumatic  EXAM: CT LUMBAR SPINE WITHOUT CONTRAST  TECHNIQUE: Multidetector CT imaging of the lumbar spine  was performed without intravenous contrast administration. Multiplanar CT image reconstructions were also generated.  RADIATION DOSE REDUCTION: This exam was performed according to the departmental dose-optimization program which includes automated exposure control, adjustment of the mA and/or kV according to patient size and/or use of iterative reconstruction technique.  COMPARISON:  None Available.  FINDINGS: Segmentation: 5 lumbar type vertebrae.  Alignment: Normal.  Vertebrae: Multilevel mild moderate osteophyte formation no acute fracture or focal pathologic  process.  Paraspinal and other soft tissues: Negative.  Disc levels: Maintained.  Other: Atherosclerotic plaque. Left flank subcutaneus soft tissue neural stimulator. Query partial right nephrectomy versus scarring.  IMPRESSION: 1. No acute displaced fracture or traumatic listhesis of the lumbar spine. 2.  Aortic Atherosclerosis (ICD10-I70.0).   Electronically Signed By: Iven Finn M.D. On: 01/31/2022 22:10   Narrative CLINICAL DATA:  Chronic low back pain. Left-sided sciatica. Personal history of spinal cord stimulator.  EXAM: CT MYELOGRAPHY LUMBAR SPINE  TECHNIQUE: CT imaging of the lumbar spine was performed after Isovue 300 M contrast administration. Multiplanar CT image reconstructions were also generated.  COMPARISON:  MRI of the lumbar spine 07/03/2011.  FINDINGS: Opacification of the subarachnoid space is excellent. Injection was performed by Dr. Gershon Mussel Register  Segmentation: 5 non rib-bearing lumbar type vertebral bodies are present.  Alignment: AP alignment is anatomic.  Vertebrae: Vertebral body heights are maintained. No acute or healing fractures are present.  Conus medullaris: Extends to the T12-L1 level and appears normal.  Paraspinal and other soft tissues: Atherosclerotic calcifications are present in the aorta and branch vessels without aneurysm.  Disc levels:  L1-2: Moderate facet hypertrophy is present bilaterally.  L2-3: Asymmetric left-sided facet hypertrophy and spurring is present without significant stenosis.  L3-4: A mild broad-based disc bulge is present. Short pedicles and mild facet hypertrophy is noted bilaterally. There is no focal stenosis.  L4-5: A broad-based disc protrusion is present. Asymmetric left-sided facet hypertrophy ligamentum flavum thickening is present. This contributes to mild left subarticular stenosis. Mild foraminal narrowing is worse on the left. Short pedicles contribute.  L5-S1: No significant  disc protrusion is present. Moderate facet hypertrophy is noted bilaterally. There is no focal stenosis.  IMPRESSION: 1. Broad-based disc protrusion and facet hypertrophy at L4-5 with mild left subarticular stenosis and left greater than right foraminal narrowing. 2. Multilevel facet disease through the remainder lumbar spine without focal stenosis elsewhere.   Electronically Signed By: San Morelle M.D. On: 06/25/2017 09:52   Narrative CLINICAL DATA:  Low back pain.  Prior surgery.  EXAM: LUMBAR MYELOGRAM  FLUOROSCOPY TIME:  3 minutes 36 seconds.  PROCEDURE: After thorough discussion of risks and benefits of the procedure including bleeding, infection, injury to nerves, blood vessels, adjacent structures as well as headache and CSF leak, written and oral informed consent was obtained. Consent was obtained by Dr. Marcello Moores Register. Time out form was completed.  Patient was positioned prone on the fluoroscopy table. Local anesthesia was provided with 1% lidocaine without epinephrine after prepped and draped in the usual sterile fashion. Puncture was performed at L4-L5 using a 3 1/2 inch 22-gauge spinal needle via (_ left ___Paramedian) approach. Using a single pass through the dura, the needle was placed within the thecal sac, with return of clear CSF. 70m of Isovue M- 300 was injected into the thecal sac, with normal opacification of the nerve roots and cauda equina consistent with free flow within the subarachnoid space.  I personally performed the lumbar puncture and administered the intrathecal contrast. I also personally  supervised acquisition of the myelogram images.  TECHNIQUE: Contiguous axial images were obtained through the Lumbar spine after the intrathecal infusion of infusion. Coronal and sagittal reconstructions were obtained of the axial image sets.  COMPARISON:  CT 05/17/2015.  MRI 07/03/2011.  FINDINGS: LUMBAR MYELOGRAM  FINDINGS:  Degenerative changes noted of the lumbar spine. Reference is made to postmyelogram CT for further findings.  CT LUMBAR MYELOGRAM FINDINGS:  Reference is made to postmyelogram CT report.  IMPRESSION: LUMBAR MYELOGRAM IMPRESSION:  Degenerate changes lumbar spine. Reference is made to postmyelogram CT for further findings  CT LUMBAR MYELOGRAM IMPRESSION:  Reference is made to postmyelogram CT report.   Electronically Signed By: Marcello Moores  Register On: 06/25/2017 09:42    Narrative CLINICAL DATA:  Previous cervical fusion. Left arm pain, numbness in weakness.  FLUOROSCOPY TIME:  1 minutes 11 seconds. 177.36 micro gray meter squared  PROCEDURE: LUMBAR PUNCTURE FOR CERVICAL MYELOGRAM  After thorough discussion of risks and benefits of the procedure including bleeding, infection, injury to nerves, blood vessels, adjacent structures as well as headache and CSF leak, written and oral informed consent was obtained. Consent was obtained by Dr. Nelson Chimes. We discussed the high likelihood of obtaining a diagnostic study.  Patient was positioned prone on the fluoroscopy table. Local anesthesia was provided with 1% lidocaine without epinephrine after prepped and draped in the usual sterile fashion. Puncture was performed at left L4-5 using a 3 1/2 inch 22-gauge spinal needle via left para median approach. Using a single pass through the dura, the needle was placed within the thecal sac, with return of clear CSF. 10 mL of Isovue-300 was injected into the thecal sac, with normal opacification of the nerve roots and cauda equina consistent with free flow within the subarachnoid space. The patient was then moved to the trendelenburg position and contrast flowed into the Cervical spine region.  I personally performed the lumbar puncture and administered the intrathecal contrast. I also personally performed acquisition of the myelogram images.  TECHNIQUE: Contiguous  axial images were obtained through the Cervical spine after the intrathecal infusion of infusion. Coronal and sagittal reconstructions were obtained of the axial image sets.  FINDINGS: CERVICAL MYELOGRAM FINDINGS:  Previous ACDF at C6-7 appears solid. There is wide patency of the central canal. No diminished root sleeve filling is demonstrated. Small anterior extradural defects at C3-4 and C4-5 without apparent neural compression. Flexion extension views do not show abnormal motion.  CT CERVICAL MYELOGRAM FINDINGS:  Foramen magnum is widely patent. Ordinary osteoarthritis at the C1-2 articulation.  C2-3:  Normal interspace.  C3-4: Mild bilateral uncovertebral degeneration. No compressive narrowing of the canal or foramina. No facet arthropathy.  C4-5: Mild bilateral uncovertebral degeneration. No compressive narrowing of the canal or foramina. Mild facet osteoarthritis on the right but no canal or foraminal stenosis.  C5-6: Mild bilateral uncovertebral degeneration. No facet arthropathy. No canal or foraminal stenosis.  C6-7: Previous ACDF with solid fusion and wide patency of the canal and foramina.  C7-T1: No disc pathology. Mild bilateral facet degeneration with mild osteophytic encroachment upon the foramina but without visible neural compression.  T1-2:  Mild facet osteoarthritis.  No canal or foraminal stenosis.  IMPRESSION: No cause of the presenting symptoms is identified.  Good appearance at the fusion level of C6-7.  Mild uncovertebral hypertrophy at C3-4, C4-5 and C5-6 but without significant canal or foraminal narrowing.  Bilateral facet degeneration at C7-T1 with mild osteophytic encroachment upon the foramina but no apparent compressive stenosis.   Electronically  Signed By: Nelson Chimes M.D. On: 05/08/2016 11:34  Complexity Note: Imaging results reviewed. Results shared with Mr. Dibella, using Layman's terms.                         ROS   Cardiovascular: High blood pressure Pulmonary or Respiratory: No reported pulmonary signs or symptoms such as wheezing and difficulty taking a deep full breath (Asthma), difficulty blowing air out (Emphysema), coughing up mucus (Bronchitis), persistent dry cough, or temporary stoppage of breathing during sleep Neurological: No reported neurological signs or symptoms such as seizures, abnormal skin sensations, urinary and/or fecal incontinence, being born with an abnormal open spine and/or a tethered spinal cord Psychological-Psychiatric: Depressed Gastrointestinal: No reported gastrointestinal signs or symptoms such as vomiting or evacuating blood, reflux, heartburn, alternating episodes of diarrhea and constipation, inflamed or scarred liver, or pancreas or irrregular and/or infrequent bowel movements Genitourinary: No reported renal or genitourinary signs or symptoms such as difficulty voiding or producing urine, peeing blood, non-functioning kidney, kidney stones, difficulty emptying the bladder, difficulty controlling the flow of urine, or chronic kidney disease Hematological: No reported hematological signs or symptoms such as prolonged bleeding, low or poor functioning platelets, bruising or bleeding easily, hereditary bleeding problems, low energy levels due to low hemoglobin or being anemic Endocrine: High blood sugar controlled without the use of insulin (NIDDM) Rheumatologic: No reported rheumatological signs and symptoms such as fatigue, joint pain, tenderness, swelling, redness, heat, stiffness, decreased range of motion, with or without associated rash Musculoskeletal: Negative for myasthenia gravis, muscular dystrophy, multiple sclerosis or malignant hyperthermia Work History: Retired  Allergies  Mr. Buxton is allergic to topamax [topiramate], keppra [levetiracetam], neurontin [gabapentin], and cymbalta [duloxetine hcl].  Laboratory Chemistry Profile   Renal Lab Results  Component  Value Date   BUN 12 02/09/2022   CREATININE 0.99 02/09/2022   BCR 10 01/19/2022   GFRAA 71 03/21/2020   GFRNONAA >60 02/09/2022   SPECGRAV 1.015 03/11/2016   PHUR 5.0 03/11/2016   PROTEINUR neg 03/11/2016     Electrolytes Lab Results  Component Value Date   NA 139 02/09/2022   K 3.8 02/09/2022   CL 106 02/09/2022   CALCIUM 8.9 02/09/2022   MG 1.9 02/09/2022   PHOS 2.7 (L) 01/19/2022     Hepatic Lab Results  Component Value Date   AST 21 02/09/2022   ALT 25 02/09/2022   ALBUMIN 3.9 02/09/2022   ALKPHOS 63 02/09/2022     ID Lab Results  Component Value Date   HIV Non Reactive 09/09/2021   STAPHAUREUS NEGATIVE 06/16/2011   MRSAPCR NEGATIVE 01/28/2013     Bone Lab Results  Component Value Date   TESTOSTERONE 270 12/08/2021     Endocrine Lab Results  Component Value Date   GLUCOSE 127 (H) 02/09/2022   HGBA1C 6.7 (A) 01/19/2022   TSH 1.410 03/25/2021   TESTOSTERONE 270 12/08/2021     Neuropathy Lab Results  Component Value Date   HGBA1C 6.7 (A) 01/19/2022   HIV Non Reactive 09/09/2021     CNS No results found for: "COLORCSF", "APPEARCSF", "RBCCOUNTCSF", "WBCCSF", "POLYSCSF", "LYMPHSCSF", "EOSCSF", "PROTEINCSF", "GLUCCSF", "JCVIRUS", "CSFOLI", "IGGCSF", "LABACHR", "ACETBL"   Inflammation (CRP: Acute  ESR: Chronic) No results found for: "CRP", "ESRSEDRATE", "LATICACIDVEN"   Rheumatology No results found for: "RF", "ANA", "LABURIC", "URICUR", "LYMEIGGIGMAB", "LYMEABIGMQN", "HLAB27"   Coagulation Lab Results  Component Value Date   INR 1.0 02/09/2022   LABPROT 13.5 02/09/2022   PLT 225 02/09/2022  Cardiovascular Lab Results  Component Value Date   CKTOTAL 46 01/28/2013   CKMB 1.4 01/28/2013   TROPONINI <0.03 01/03/2016   HGB 17.1 (H) 02/09/2022   HCT 49.0 02/09/2022     Screening Lab Results  Component Value Date   STAPHAUREUS NEGATIVE 06/16/2011   MRSAPCR NEGATIVE 01/28/2013   HIV Non Reactive 09/09/2021     Cancer No results found  for: "CEA", "CA125", "LABCA2"   Allergens No results found for: "ALMOND", "APPLE", "ASPARAGUS", "AVOCADO", "BANANA", "BARLEY", "BASIL", "BAYLEAF", "GREENBEAN", "LIMABEAN", "WHITEBEAN", "BEEFIGE", "REDBEET", "BLUEBERRY", "BROCCOLI", "CABBAGE", "MELON", "CARROT", "CASEIN", "CASHEWNUT", "CAULIFLOWER", "CELERY"     Note: Lab results reviewed.  PFSH  Drug: Mr. Debono  reports no history of drug use. Alcohol:  reports no history of alcohol use. Tobacco:  reports that he quit smoking about 13 years ago. His smoking use included cigarettes. He has a 45.00 pack-year smoking history. He quit smokeless tobacco use about 26 years ago. Medical:  has a past medical history of Actinic keratosis, Basal cell carcinoma (12/23/2021), Cancer ([place]), Depression, Diabetes mellitus without complication ([place]), Dyspnea, Fatigue, History of basal cell carcinoma (BCC) (05/21/2016), dysplastic nevus (09/26/2013), Hyperlipidemia, Hypertension, Hypogonadism male, Neuromuscular disorder ([place]), Neuropathy, PAD (peripheral artery disease) ([place]), Sleep apnea, Tinnitus, and Tuberculosis. Family: family history includes Atrial fibrillation in his mother; Cancer in his maternal grandmother and maternal uncle; Coronary artery disease in his father; Dementia in his mother; Diabetes in his father; Heart disease in his father; Hyperlipidemia in his father; Hypertension in his father and mother; Migraines in his daughter; Transient ischemic attack in his mother.  Past Surgical History:  Procedure Laterality Date   AMPUTATION TOE Right 03/04/2018   Procedure: AMPUTATION TOE/MPJ JOINT FIFTH RIGHT;  Surgeon: Edrick Kins, DPM;  Location: [facility];  Service: Podiatry;  Laterality: Right;   BACK SURGERY  2012   neck was 2012,back same year. plate in H8-8.[id number]   CARDIAC CATHETERIZATION  01/31/2013   Medical management   CARPAL TUNNEL RELEASE Bilateral    CATARACT EXTRACTION Right 2014   CATARACT EXTRACTION W/PHACO Left 03/12/2016    Procedure: CATARACT EXTRACTION PHACO AND INTRAOCULAR LENS PLACEMENT ([place]);  Surgeon: Birder Robson, MD;  Location: [facility];  Service: Ophthalmology;  Laterality: Left;  [place] 00:31AP% 17.9CDE 5.67Fluid pack lot # [id number]   CERVICAL FUSION  2012   CORONARY ANGIOPLASTY  2014   all good   IR ANGIO INTRA EXTRACRAN SEL INTERNAL CAROTID BILAT MOD SED  02/01/2022   IR ANGIO VERTEBRAL SEL VERTEBRAL UNI R MOD SED  02/01/2022   KNEE ARTHROSCOPY Left    LEFT HEART CATHETERIZATION WITH CORONARY ANGIOGRAM N/A 01/31/2013   Procedure: LEFT HEART CATHETERIZATION WITH CORONARY ANGIOGRAM;  Surgeon: Minus Breeding, MD;  Location: [facility] C[facility];  Service: Cardiovascular;  Laterality: N/A;   LUMBAR LAMINECTOMY/DECOMPRESSION MICRODISCECTOMY  06/22/2011   Procedure: LUMBAR LAMINECTOMY/DECOMPRESSION MICRODISCECTOMY;  Surgeon: Ophelia Charter;  Location: [facility];  Service: Neurosurgery;  Laterality: N/A;  Thoracic Ten-Eleven,Thoracic Eleven-Twelve Laminectomy   Pain stimulator     ulnar N/A    VASECTOMY  1991   Active Ambulatory Problems    Diagnosis Date Noted   Thoracic spinal stenosis 06/22/2011   Chest pain 01/28/2013   Seizures ([place]) 01/28/2013   Syncope 01/28/2013   Fatigue 03/17/2013   Abnormal kidney function 12/06/2014   Cervical nerve root disorder 12/06/2014   Colon polyp 12/06/2014   Clinical depression 12/06/2014   Essential (primary) hypertension 12/06/2014   Cephalalgia 12/06/2014   Cervical disc disorder with radiculopathy  of cervical region 12/06/2014   HLD (hyperlipidemia) 12/06/2014   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   Displacement of lumbar intervertebral disc without myelopathy 12/06/2014   L-S radiculopathy 12/06/2014   Mild major depression ([place]) 12/06/2014   Neuropathy 12/06/2014   Adiposity 12/06/2014   Peripheral vascular disease ([place]) 12/06/2014   Diabetes mellitus, type 2 ([place]) 12/06/2014   Cervical post-laminectomy syndrome 10/04/2013   Polypharmacy 08/02/2012   Bernhardt's  paresthesia 08/02/2012   Chronic neck pain 10/04/2013   Peripheral neuropathic pain 10/04/2013   Chronic pain syndrome 08/17/2012   Post laminectomy syndrome 08/02/2012   DDD (degenerative disc disease), cervical 02/25/2016   S/P cervical spinal fusion 02/25/2016   Cervical spondylosis without myelopathy 02/25/2016   DDD (degenerative disc disease), lumbar 02/25/2016   Facet syndrome, lumbar 02/25/2016   Sacroiliac joint dysfunction 02/25/2016   Atherosclerotic peripheral vascular disease ([place]) 02/25/2016   ASCVD (arteriosclerotic cardiovascular disease) 02/22/2019   Infected ulcer of skin ([place]) 04/17/2019   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[place]) 03/27/2021   Opioid-induced hyperalgesia 03/27/2021   Spinal cord stimulator status 03/27/2021   History of lumbar laminectomy 03/27/2021   Moderate episode of recurrent major depressive disorder ([place]) 03/27/2021   Subarachnoid hemorrhage ([place]) 02/01/2022   Bilateral primary osteoarthritis of knee 02/24/2022   S/P arthroscopic surgery of left knee 02/24/2022   Chronic painful diabetic neuropathy ([place]) 02/24/2022   Resolved Ambulatory Problems    Diagnosis Date Noted   Hypokalemia 01/30/2013   Positive D dimer 01/30/2013   Lyme disease 12/06/2014   Failed back syndrome of thoracic spine 10/04/2013   Past Medical History:  Diagnosis Date   Actinic keratosis    Basal cell carcinoma 12/23/2021   Cancer ([place])    Depression    Diabetes mellitus without complication ([place])    Dyspnea    History of basal cell carcinoma (BCC) 05/21/2016   Hx of dysplastic nevus 09/26/2013   Hyperlipidemia    Hypertension    Hypogonadism male    Neuromuscular disorder ([place])    PAD (peripheral artery disease) ([place])    Sleep apnea    Tinnitus    Tuberculosis    Constitutional Exam  General appearance: Well nourished, well developed, and well hydrated. In no apparent acute distress Vitals:   02/24/22 0859  BP: (!) 158/84  Pulse: 71   Resp: 16  Temp: (!) 97.5 F (36.4 C)  TempSrc: Temporal  SpO2: 99%  Weight: 209 lb 12.8 oz (95.2 kg)  Height: _0  (1.727 m)   BMI Assessment: Estimated body mass index is 31.9 kg/m as calculated from the following:   Height as of this encounter: _1  (1.727 m).   Weight as of this encounter: 209 lb 12.8 oz (95.2 kg).  BMI interpretation table: BMI level Category Range association with higher incidence of chronic pain  <18 kg/m2 Underweight   18.5-24.9 kg/m2 Ideal body weight   25-29.9 kg/m2 Overweight Increased incidence by 20%  30-34.9 kg/m2 Obese (Class I) Increased incidence by 68%  35-39.9 kg/m2 Severe obesity (Class II) Increased incidence by 136%  >40 kg/m2 Extreme obesity (Class III) Increased incidence by 254%   Patient's current BMI Ideal Body weight  Body mass index is 31.9 kg/m. Ideal body weight: 68.4 kg (150 lb 12.7 oz) Adjusted ideal body weight: 79.1 kg (174 lb 6.4 oz)   BMI Readings from Last 4 Encounters:  02/24/22 31.90 kg/m  02/18/22 31.17 kg/m  02/09/22 22.81 kg/m  01/31/22 22.81 kg/m   Wt Readings from Last 4 Encounters:  02/24/22 209 lb 12.8 oz (95.2 kg)  02/18/22 205 lb (93 kg)  01/31/22 150 lb (68 kg)  01/19/22 212 lb (96.2 kg)    Psych/Mental status: Alert, oriented x 3 (person, place, & time)       Eyes: PERLA Respiratory: No evidence of acute respiratory distress  Cervical Spine Area Exam  Skin & Axial Inspection: Well healed scar from previous spine surgery detected Alignment: Asymmetric Functional ROM: Pain restricted ROM, bilaterally Stability: No instability detected Muscle Tone/Strength: Guarding observed Sensory (Neurological): Neurogenic pain pattern Palpation: Complains of area being tender to palpation             Upper Extremity (UE) Exam    Side: Right upper extremity  Side: Left upper extremity  Skin & Extremity Inspection: Skin color, temperature, and hair growth are WNL. No peripheral edema or cyanosis. No masses,  redness, swelling, asymmetry, or associated skin lesions. No contractures.  Skin & Extremity Inspection: Skin color, temperature, and hair growth are WNL. No peripheral edema or cyanosis. No masses, redness, swelling, asymmetry, or associated skin lesions. No contractures.  Functional ROM: Unrestricted ROM          Functional ROM: Pain restricted ROM for shoulder and elbow  Muscle Tone/Strength: Functionally intact. No obvious neuro-muscular anomalies detected.  Muscle Tone/Strength: Functionally intact. No obvious neuro-muscular anomalies detected.  Sensory (Neurological): Dermatomal pain pattern          Sensory (Neurological): Dermatomal pain pattern          Palpation: No palpable anomalies              Palpation: No palpable anomalies              Provocative Test(s):  Phalen's test: deferred Tinel's test: deferred Apley's scratch test (touch opposite shoulder):  Action 1 (Across chest): Decreased ROM Action 2 (Overhead): Decreased ROM Action 3 (LB reach): Decreased ROM   Provocative Test(s):  Phalen's test: deferred Tinel's test: deferred Apley's scratch test (touch opposite shoulder):  Action 1 (Across chest): Decreased ROM Action 2 (Overhead): Decreased ROM Action 3 (LB reach): Decreased ROM    Thoracic Spine Area Exam  Skin & Axial Inspection: No masses, redness, or swelling Alignment: Symmetrical Functional ROM: Decreased ROM Stability: No instability detected Muscle Tone/Strength: Functionally intact. No obvious neuro-muscular anomalies detected. Sensory (Neurological): Articular pain pattern Muscle strength & Tone: No palpable anomalies Lumbar Spine Area Exam  Skin & Axial Inspection: Well healed scar from previous spine surgery detected Alignment: Scoliosis detected Functional ROM: Mechanically restricted ROM       Stability: No instability detected Muscle Tone/Strength: Functionally intact. No obvious neuro-muscular anomalies detected. Sensory (Neurological):  Dermatomal pain pattern Palpation: No palpable anomalies        Gait & Posture Assessment  Ambulation: Unassisted Gait: Antalgic gait (limping) Posture: Difficulty standing up straight, due to pain  Lower Extremity Exam    Side: Right lower extremity- 2 toes amputated   Side: Left lower extremity  Stability: No instability observed          Stability: No instability observed          Skin & Extremity Inspection:  Amputation of 2 toes  Skin & Extremity Inspection: Skin color, temperature, and hair growth are WNL. No peripheral edema or cyanosis. No masses, redness, swelling, asymmetry, or associated skin lesions. No contractures.  Functional ROM: Pain restricted ROM for hip joint          Functional ROM: Pain restricted ROM for hip and  knee joints          Muscle Tone/Strength: Functionally intact. No obvious neuro-muscular anomalies detected.  Muscle Tone/Strength: Functionally intact. No obvious neuro-muscular anomalies detected.  Sensory (Neurological): Neurogenic pain pattern        Sensory (Neurological): Neurogenic pain pattern        DTR: Patellar: deferred today Achilles: deferred today Plantar: deferred today  DTR: Patellar: deferred today Achilles: deferred today Plantar: deferred today  Palpation: No palpable anomalies  Palpation: No palpable anomalies    Assessment  Primary Diagnosis & Pertinent Problem List: The primary encounter diagnosis was Cervical facet joint syndrome. 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  Visit Diagnosis (New problems to examiner): 1. Cervical facet joint syndrome   2. Cervical spondylosis without myelopathy   3. S/P cervical spinal fusion   4. 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 Bilateral primary osteoarthritis of knee   6. S/P arthroscopic surgery of left knee   7. Chronic painful diabetic neuropathy ([place])   8. ASCVD (arteriosclerotic cardiovascular disease)   9. Spinal cord stimulator status   10. Chronic pain syndrome   11. Type 2 diabetes mellitus with diabetic polyneuropathy, without long-term current use of insulin ([place])   12. Post laminectomy syndrome    Plan of Care (Initial workup plan)   Problem-specific plan: Chronic painful diabetic neuropathy ([place]) Continue Lyrica as prescribed.  Discussed Qutenza for painful diabetic neuropathy that the patient would like to move forward with.  Post laminectomy syndrome Status post lumbar spinal cord stimulator.  Continue with that.  Cervical spondylosis without myelopathy ISIAC BREIGHNER has a history of greater than 3 months of moderate to severe pain which is resulted in functional impairment.  The patient has tried various conservative therapeutic options such as NSAIDs, Tylenol, muscle relaxants, physical therapy which was inadequately effective.  Patient's pain is predominantly axial with physical exam findings suggestive of facet arthropathy.  Cervical facet medial branch nerve blocks were discussed with the patient.  Risks and benefits were reviewed.  Patient would like to proceed with bilateral C4, C5, C6, C7 medial branch nerve block.  If diagnostic block is effective further consider cervical medial branch radiofrequency cervical medial branch stim   Bilateral primary osteoarthritis of knee I discussed genicular nerve block with patient possible RFA    Procedure Orders         CERVICAL FACET (MEDIAL BRANCH NERVE BLOCK)           NEUROLYSIS       Pharmacological management options: defer to PCP, do not recommend COT  I spent a total of 60 minutes reviewing chart data, face-to-face evaluation with the patient, counseling and coordination of care as detailed above.   Provider-requested follow-up: Return in about 8 days (around 03/04/2022) for B/L C-Fct MBNB + Qutenza (90 mins), PO Valium up to pt.  Future Appointments  Date Time Provider [place]  05/27/2022  9:40 AM Jerrol Banana., MD [facility]  07/06/2022 10:45 AM Brendolyn Patty, MD [facility] None  08/21/2022 10:15 AM Edrick Kins, DPM [email or web address]  Note by: Gillis Santa, MD Date: 02/24/2022; Time: 10:52 AM

## 2022-02-24 NOTE — Assessment & Plan Note (Signed)
Continue Lyrica as prescribed.  Discussed Qutenza for painful diabetic neuropathy that the patient would like to move forward with.

## 2022-02-24 NOTE — Assessment & Plan Note (Signed)
Status post lumbar spinal cord stimulator.  Continue with that.

## 2022-02-24 NOTE — Assessment & Plan Note (Signed)
I discussed genicular nerve block with patient possible RFA

## 2022-02-24 NOTE — Patient Instructions (Signed)
GENERAL RISKS AND COMPLICATIONS ° °What are the risk, side effects and possible complications? °Generally speaking, most procedures are safe.  However, with any procedure there are risks, side effects, and the possibility of complications.  The risks and complications are dependent upon the sites that are lesioned, or the type of nerve block to be performed.  The closer the procedure is to the spine, the more serious the risks are.  Great care is taken when placing the radio frequency needles, block needles or lesioning probes, but sometimes complications can occur. °Infection: Any time there is an injection through the skin, there is a risk of infection.  This is why sterile conditions are used for these blocks.  There are four possible types of infection. °Localized skin infection. °Central Nervous System Infection-This can be in the form of Meningitis, which can be deadly. °Epidural Infections-This can be in the form of an epidural abscess, which can cause pressure inside of the spine, causing compression of the spinal cord with subsequent paralysis. This would require an emergency surgery to decompress, and there are no guarantees that the patient would recover from the paralysis. °Discitis-This is an infection of the intervertebral discs.  It occurs in about 1% of discography procedures.  It is difficult to treat and it may lead to surgery. ° °      2. Pain: the needles have to go through skin and soft tissues, will cause soreness. °      3. Damage to internal structures:  The nerves to be lesioned may be near blood vessels or   ° other nerves which can be potentially damaged. °      4. Bleeding: Bleeding is more common if the patient is taking blood thinners such as  aspirin, Coumadin, Ticiid, Plavix, etc., or if he/she have some genetic predisposition  such as hemophilia. Bleeding into the spinal canal can cause compression of the spinal  cord with subsequent paralysis.  This would require an emergency  surgery to  decompress and there are no guarantees that the patient would recover from the  paralysis. °      5. Pneumothorax:  Puncturing of a lung is a possibility, every time a needle is introduced in  the area of the chest or upper back.  Pneumothorax refers to free air around the  collapsed lung(s), inside of the thoracic cavity (chest cavity).  Another two possible  complications related to a similar event would include: Hemothorax and Chylothorax.   These are variations of the Pneumothorax, where instead of air around the collapsed  lung(s), you may have blood or chyle, respectively. °      6. Spinal headaches: They may occur with any procedures in the area of the spine. °      7. Persistent CSF (Cerebro-Spinal Fluid) leakage: This is a rare problem, but may occur  with prolonged intrathecal or epidural catheters either due to the formation of a fistulous  track or a dural tear. °      8. Nerve damage: By working so close to the spinal cord, there is always a possibility of  nerve damage, which could be as serious as a permanent spinal cord injury with  paralysis. °      9. Death:  Although rare, severe deadly allergic reactions known as "Anaphylactic  reaction" can occur to any of the medications used. °     10. Worsening of the symptoms:  We can always make thing worse. ° °What are the chances   of something like this happening? °Chances of any of this occuring are extremely low.  By statistics, you have more of a chance of getting killed in a motor vehicle accident: while driving to the hospital than any of the above occurring .  Nevertheless, you should be aware that they are possibilities.  In general, it is similar to taking a shower.  Everybody knows that you can slip, hit your head and get killed.  Does that mean that you should not shower again?  Nevertheless always keep in mind that statistics do not mean anything if you happen to be on the wrong side of them.  Even if a procedure has a 1 (one) in a  1,000,000 (million) chance of going wrong, it you happen to be that one..Also, keep in mind that by statistics, you have more of a chance of having something go wrong when taking medications. ° °Who should not have this procedure? °If you are on a blood thinning medication (e.g. Coumadin, Plavix, see list of "Blood Thinners"), or if you have an active infection going on, you should not have the procedure.  If you are taking any blood thinners, please inform your physician. ° °How should I prepare for this procedure? °Do not eat or drink anything at least six hours prior to the procedure. °Bring a driver with you .  It cannot be a taxi. °Come accompanied by an adult that can drive you back, and that is strong enough to help you if your legs get weak or numb from the local anesthetic. °Take all of your medicines the morning of the procedure with just enough water to swallow them. °If you have diabetes, make sure that you are scheduled to have your procedure done first thing in the morning, whenever possible. °If you have diabetes, take only half of your insulin dose and notify our nurse that you have done so as soon as you arrive at the clinic. °If you are diabetic, but only take blood sugar pills (oral hypoglycemic), then do not take them on the morning of your procedure.  You may take them after you have had the procedure. °Do not take aspirin or any aspirin-containing medications, at least eleven (11) days prior to the procedure.  They may prolong bleeding. °Wear loose fitting clothing that may be easy to take off and that you would not mind if it got stained with Betadine or blood. °Do not wear any jewelry or perfume °Remove any nail coloring.  It will interfere with some of our monitoring equipment. ° °NOTE: Remember that this is not meant to be interpreted as a complete list of all possible complications.  Unforeseen problems may occur. ° °BLOOD THINNERS °The following drugs contain aspirin or other products,  which can cause increased bleeding during surgery and should not be taken for 2 weeks prior to and 1 week after surgery.  If you should need take something for relief of minor pain, you may take acetaminophen which is found in Tylenol,m Datril, Anacin-3 and Panadol. It is not blood thinner. The products listed below are.  Do not take any of the products listed below in addition to any listed on your instruction sheet. ° °A.P.C or A.P.C with Codeine Codeine Phosphate Capsules #3 Ibuprofen Ridaura  °ABC compound Congesprin Imuran rimadil  °Advil Cope Indocin Robaxisal  °Alka-Seltzer Effervescent Pain Reliever and Antacid Coricidin or Coricidin-D ° Indomethacin Rufen  °Alka-Seltzer plus Cold Medicine Cosprin Ketoprofen S-A-C Tablets  °Anacin Analgesic Tablets or Capsules Coumadin   Korlgesic Salflex  Anacin Extra Strength Analgesic tablets or capsules CP-2 Tablets Lanoril Salicylate  Anaprox Cuprimine Capsules Levenox Salocol  Anexsia-D Dalteparin Magan Salsalate  Anodynos Darvon compound Magnesium Salicylate Sine-off  Ansaid Dasin Capsules Magsal Sodium Salicylate  Anturane Depen Capsules Marnal Soma  APF Arthritis pain formula Dewitt's Pills Measurin Stanback  Argesic Dia-Gesic Meclofenamic Sulfinpyrazone  Arthritis Bayer Timed Release Aspirin Diclofenac Meclomen Sulindac  Arthritis pain formula Anacin Dicumarol Medipren Supac  Analgesic (Safety coated) Arthralgen Diffunasal Mefanamic Suprofen  Arthritis Strength Bufferin Dihydrocodeine Mepro Compound Suprol  Arthropan liquid Dopirydamole Methcarbomol with Aspirin Synalgos  ASA tablets/Enseals Disalcid Micrainin Tagament  Ascriptin Doan's Midol Talwin  Ascriptin A/D Dolene Mobidin Tanderil  Ascriptin Extra Strength Dolobid Moblgesic Ticlid  Ascriptin with Codeine Doloprin or Doloprin with Codeine Momentum Tolectin  Asperbuf Duoprin Mono-gesic Trendar  Aspergum Duradyne Motrin or Motrin IB Triminicin  Aspirin plain, buffered or enteric coated  Durasal Myochrisine Trigesic  Aspirin Suppositories Easprin Nalfon Trillsate  Aspirin with Codeine Ecotrin Regular or Extra Strength Naprosyn Uracel  Atromid-S Efficin Naproxen Ursinus  Auranofin Capsules Elmiron Neocylate Vanquish  Axotal Emagrin Norgesic Verin  Azathioprine Empirin or Empirin with Codeine Normiflo Vitamin E  Azolid Emprazil Nuprin Voltaren  Bayer Aspirin plain, buffered or children's or timed BC Tablets or powders Encaprin Orgaran Warfarin Sodium  Buff-a-Comp Enoxaparin Orudis Zorpin  Buff-a-Comp with Codeine Equegesic Os-Cal-Gesic   Buffaprin Excedrin plain, buffered or Extra Strength Oxalid   Bufferin Arthritis Strength Feldene Oxphenbutazone   Bufferin plain or Extra Strength Feldene Capsules Oxycodone with Aspirin   Bufferin with Codeine Fenoprofen Fenoprofen Pabalate or Pabalate-SF   Buffets II Flogesic Panagesic   Buffinol plain or Extra Strength Florinal or Florinal with Codeine Panwarfarin   Buf-Tabs Flurbiprofen Penicillamine   Butalbital Compound Four-way cold tablets Penicillin   Butazolidin Fragmin Pepto-Bismol   Carbenicillin Geminisyn Percodan   Carna Arthritis Reliever Geopen Persantine   Carprofen Gold's salt Persistin   Chloramphenicol Goody's Phenylbutazone   Chloromycetin Haltrain Piroxlcam   Clmetidine heparin Plaquenil   Cllnoril Hyco-pap Ponstel   Clofibrate Hydroxy chloroquine Propoxyphen         Before stopping any of these medications, be sure to consult the physician who ordered them.  Some, such as Coumadin (Warfarin) are ordered to prevent or treat serious conditions such as "deep thrombosis", "pumonary embolisms", and other heart problems.  The amount of time that you may need off of the medication may also vary with the medication and the reason for which you were taking it.  If you are taking any of these medications, please make sure you notify your pain physician before you undergo any procedures.         Capsaicin  Patches What is this medication? CAPSAICIN (cap SAY sin) relieves minor pain in your muscles and joints. It works by making your skin feel warm or cool, which blocks pain signals going to the brain. This medicine may be used for other purposes; ask your health care provider or pharmacist if you have questions. COMMON BRAND NAME(S): Qutenza What should I tell my care team before I take this medication? They need to know if you have any of these conditions: Broken or irritated skin High blood pressure History of heart attack or stroke An unusual or allergic reaction to capsaicin, hot peppers, other medications, foods, dyes, or preservatives Pregnant or trying to get pregnant Breast-feeding How should I use this medication? This medication is for external use only. It is applied by your care team in a  hospital or clinic setting. Talk to your care team about the use of this medication in children. Special care may be needed. Overdosage: If you think you have taken too much of this medicine contact a poison control center or emergency room at once. NOTE: This medicine is only for you. Do not share this medicine with others. What if I miss a dose? This does not apply. What may interact with this medication? Interactions are not expected. Do not use any other skin products on the affected area without asking your care team. This list may not describe all possible interactions. Give your health care provider a list of all the medicines, herbs, non-prescription drugs, or dietary supplements you use. Also tell them if you smoke, drink alcohol, or use illegal drugs. Some items may interact with your medicine. What should I watch for while using this medication? Your condition will be monitored carefully while you are receiving this medication. Your blood pressure may go up during the procedure. Do not touch the medication patch during treatment. This medication causes red, burning skin. You may need  pain medication for during and after the procedure. This medication can make you more sensitive to heat for a few days after treatment. Be careful in hot showers or baths. Keep out of the sun. Exercise may make the treated skin feel hotter. Tell your care team if your symptoms do not start to get better or if they get worse. What side effects may I notice from receiving this medication? Side effects that you should report to your care team as soon as possible: Allergic reactions--skin rash, itching, hives, swelling of the face, lips, tongue, or throat Side effects that usually do not require medical attention (report these to your care team if they continue or are bothersome): Mild skin irritation, redness, or dryness This list may not describe all possible side effects. Call your doctor for medical advice about side effects. You may report side effects to FDA at 1-800-FDA-1088. Where should I keep my medication? This medication is given in a hospital or clinic. It will not be stored at home. NOTE: This sheet is a summary. It may not cover all possible information. If you have questions about this medicine, talk to your doctor, pharmacist, or health care provider.  2023 Elsevier/Gold Standard (2021-05-20 00:00:00) Facet Blocks Patient Information  Description: The facets are joints in the spine between the vertebrae.  Like any joints in the body, facets can become irritated and painful.  Arthritis can also effect the facets.  By injecting steroids and local anesthetic in and around these joints, we can temporarily block the nerve supply to them.  Steroids act directly on irritated nerves and tissues to reduce selling and inflammation which often leads to decreased pain.  Facet blocks may be done anywhere along the spine from the neck to the low back depending upon the location of your pain.   After numbing the skin with local anesthetic (like Novocaine), a small needle is passed onto the facet  joints under x-ray guidance.  You may experience a sensation of pressure while this is being done.  The entire block usually lasts about 15-25 minutes.   Conditions which may be treated by facet blocks:  Low back/buttock pain Neck/shoulder pain Certain types of headaches  Preparation for the injection:  Do not eat any solid food or dairy products within 8 hours of your appointment. You may drink clear liquid up to 3 hours before appointment.  Clear liquids include  water, black coffee, juice or soda.  No milk or cream please. You may take your regular medication, including pain medications, with a sip of water before your appointment.  Diabetics should hold regular insulin (if taken separately) and take 1/2 normal NPH dose the morning of the procedure.  Carry some sugar containing items with you to your appointment. A driver must accompany you and be prepared to drive you home after your procedure. Bring all your current medications with you. An IV may be inserted and sedation may be given at the discretion of the physician. A blood pressure cuff, EKG and other monitors will often be applied during the procedure.  Some patients may need to have extra oxygen administered for a short period. You will be asked to provide medical information, including your allergies and medications, prior to the procedure.  We must know immediately if you are taking blood thinners (like Coumadin/Warfarin) or if you are allergic to IV iodine contrast (dye).  We must know if you could possible be pregnant.  Possible side-effects:  Bleeding from needle site Infection (rare, may require surgery) Nerve injury (rare) Numbness & tingling (temporary) Difficulty urinating (rare, temporary) Spinal headache (a headache worse with upright posture) Light-headedness (temporary) Pain at injection site (serveral days) Decreased blood pressure (rare, temporary) Weakness in arm/leg (temporary) Pressure sensation in  back/neck (temporary)   Call if you experience:  Fever/chills associated with headache or increased back/neck pain Headache worsened by an upright position New onset, weakness or numbness of an extremity below the injection site Hives or difficulty breathing (go to the emergency room) Inflammation or drainage at the injection site(s) Severe back/neck pain greater than usual New symptoms which are concerning to you  Please note:  Although the local anesthetic injected can often make your back or neck feel good for several hours after the injection, the pain will likely return. It takes 3-7 days for steroids to work.  You may not notice any pain relief for at least one week.  If effective, we will often do a series of 2-3 injections spaced 3-6 weeks apart to maximally decrease your pain.  After the initial series, you may be a candidate for a more permanent nerve block of the facets.  If you have any questions, please call #[phone] [facility]

## 2022-02-24 NOTE — Assessment & Plan Note (Addendum)
Jeff [name] has a history of greater than 3 months of moderate to severe pain which is resulted in functional impairment.  The patient has tried various conservative therapeutic options such as NSAIDs, Tylenol, muscle relaxants, physical therapy which was inadequately effective.  Patient's pain is predominantly axial with physical exam findings suggestive of facet arthropathy.  Cervical facet medial branch nerve blocks were discussed with the patient.  Risks and benefits were reviewed.  Patient would like to proceed with bilateral C4, C5, C6, C7 medial branch nerve block.  If diagnostic block is effective further consider cervical medial branch radiofrequency cervical medial branch stim

## 2022-03-03 ENCOUNTER — Encounter: Payer: Self-pay | Admitting: *Deleted

## 2022-03-05 ENCOUNTER — Ambulatory Visit
Admission: RE | Admit: 2022-03-05 | Discharge: 2022-03-05 | Disposition: A | Discharge status: Home / Self Care | Payer: Medicare Other | Source: Ambulatory Visit | Attending: Neurosurgery | Admitting: Neurosurgery

## 2022-03-05 DIAGNOSIS — I609 Nontraumatic subarachnoid hemorrhage, unspecified: Secondary | ICD-10-CM

## 2022-03-05 DIAGNOSIS — Z85820 Personal history of malignant melanoma of skin: Secondary | ICD-10-CM | POA: Diagnosis not present

## 2022-03-05 DIAGNOSIS — S066X0A Traumatic subarachnoid hemorrhage without loss of consciousness, initial encounter: Secondary | ICD-10-CM | POA: Diagnosis not present

## 2022-03-05 DIAGNOSIS — Z8679 Personal history of other diseases of the circulatory system: Secondary | ICD-10-CM | POA: Diagnosis not present

## 2022-03-05 MED ORDER — IOPAMIDOL (ISOVUE-370) INJECTION 76%
75.0000 mL | Freq: Once | INTRAVENOUS | Status: AC | PRN
  Administered 2022-03-05: 75 mL via INTRAVENOUS

## 2022-03-11 ENCOUNTER — Ambulatory Visit
Admission: RE | Admit: 2022-03-11 | Discharge: 2022-03-11 | Disposition: A | Discharge status: Home / Self Care | Payer: Medicare Other | Source: Ambulatory Visit | Attending: Student in an Organized Health Care Education/Training Program | Admitting: Student in an Organized Health Care Education/Training Program

## 2022-03-11 ENCOUNTER — Ambulatory Visit
Payer: Medicare Other | Attending: Student in an Organized Health Care Education/Training Program | Admitting: Student in an Organized Health Care Education/Training Program

## 2022-03-11 ENCOUNTER — Other Ambulatory Visit: Payer: Self-pay

## 2022-03-11 ENCOUNTER — Encounter: Payer: Self-pay | Admitting: Student in an Organized Health Care Education/Training Program

## 2022-03-11 VITALS — BP 151/92 | HR 69 | Temp 97.3°F | Resp 16 | Ht 68.0 in | Wt 209.0 lb

## 2022-03-11 DIAGNOSIS — G894 Chronic pain syndrome: Secondary | ICD-10-CM | POA: Diagnosis not present

## 2022-03-11 DIAGNOSIS — E1140 Type 2 diabetes mellitus with diabetic neuropathy, unspecified: Secondary | ICD-10-CM | POA: Diagnosis not present

## 2022-03-11 DIAGNOSIS — Z981 Arthrodesis status: Secondary | ICD-10-CM | POA: Diagnosis not present

## 2022-03-11 DIAGNOSIS — E1142 Type 2 diabetes mellitus with diabetic polyneuropathy: Secondary | ICD-10-CM | POA: Diagnosis not present

## 2022-03-11 DIAGNOSIS — M47812 Spondylosis without myelopathy or radiculopathy, cervical region: Secondary | ICD-10-CM

## 2022-03-11 MED ORDER — DEXAMETHASONE SODIUM PHOSPHATE 10 MG/ML IJ SOLN
INTRAMUSCULAR | Status: AC
  Filled 2022-03-11: qty 2

## 2022-03-11 MED ORDER — DIAZEPAM 5 MG PO TABS
ORAL_TABLET | ORAL | Status: AC
  Filled 2022-03-11: qty 1

## 2022-03-11 MED ORDER — ROPIVACAINE HCL 2 MG/ML IJ SOLN
INTRAMUSCULAR | Status: AC
  Filled 2022-03-11: qty 20

## 2022-03-11 MED ORDER — LIDOCAINE HCL 2 % IJ SOLN
INTRAMUSCULAR | Status: AC
  Filled 2022-03-11: qty 20

## 2022-03-11 MED ORDER — DIAZEPAM 5 MG PO TABS
5.0000 mg | ORAL_TABLET | ORAL | Status: AC
  Administered 2022-03-11: 5 mg via ORAL

## 2022-03-11 MED ORDER — LIDOCAINE HCL 2 % IJ SOLN
20.0000 mL | Freq: Once | INTRAMUSCULAR | Status: AC
  Administered 2022-03-11: 400 mg

## 2022-03-11 MED ORDER — DEXAMETHASONE SODIUM PHOSPHATE 10 MG/ML IJ SOLN
10.0000 mg | Freq: Once | INTRAMUSCULAR | Status: AC
  Administered 2022-03-11: 10 mg

## 2022-03-11 MED ORDER — CAPSAICIN-CLEANSING GEL 8 % EX KIT
1.0000 | PACK | Freq: Once | CUTANEOUS | Status: AC
  Administered 2022-03-11: 1 via TOPICAL

## 2022-03-11 MED ORDER — ROPIVACAINE HCL 2 MG/ML IJ SOLN
9.0000 mL | Freq: Once | INTRAMUSCULAR | Status: AC
  Administered 2022-03-11: 9 mL via PERINEURAL

## 2022-03-11 NOTE — Progress Notes (Signed)
Safety precautions to be maintained throughout the outpatient stay will include: orient to surroundings, keep bed in low position, maintain call bell within reach at all times, provide assistance with transfer out of bed and ambulation.  

## 2022-03-11 NOTE — Progress Notes (Signed)
PROVIDER NOTE: Interpretation of information contained herein should be left to medically-trained personnel. Specific patient instructions are provided elsewhere under "Patient Instructions" section of medical record. This document was created in part using STT-dictation technology, any transcriptional errors that may result from this process are unintentional.  Patient: Jeff [name] Type: Established DOB: 27-Mar-1961 MRN: [id number] PCP: Jerrol Banana., MD  Service: Procedure DOS: 03/11/2022 Setting: Ambulatory Location: Ambulatory outpatient facility Delivery: Face-to-face Provider: Gillis Santa, MD Specialty: Interventional Pain Management Specialty designation: 09 Location: Outpatient facility Ref. Prov.: Gillis Santa, MD    Primary Reason for Visit: Interventional Pain Management Treatment. CC: Neck Pain (Bilateral ) and Foot Pain (Bilateral )  Interventional Treatment:          Procedure: Qutenza Neurolysis #1  Laterality:  Bilateral Area treated: Feet Imaging Guidance: None Anesthesia/analgesia/anxiolysis/sedation: None required Medication (Right): Qutenza patches Medication (Left): Qutenza patches Date: 03/11/2022 Performed by: Gillis Santa, MD  Chronic painful diabetic neuropathy  NAS-11 Pain score:   Pre-procedure: 10-Worst pain ever/10   Post-procedure: 10-Worst pain ever (Md aware and Valium given)/10     Position / Prep / Materials:  Position: Supine  Materials: Qutenza Kit Pre-op H&P Assessment:  Jeff [name] is a 61 y.o. (year old), male patient, seen today for interventional treatment. 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 Jeff [name] has a current medication list which includes the following prescription(s): acetaminophen, amlodipine, aripiprazole, empagliflozin, gentamicin cream, ibuprofen, losartan, metformin, metoprolol succinate, pregabalin, rosuvastatin, sildenafil, and levetiracetam. His primarily concern today is the Neck Pain (Bilateral ) and Foot Pain (Bilateral )  Initial Vital Signs:  Pulse/HCG Rate: 63  Temp: (!) 97.3 F (36.3 C) Resp: 16 BP: 137/81 SpO2: 100 %  BMI: Estimated body mass index is 31.78 kg/m as calculated from the following:   Height as of this encounter: _0  (1.727 m).   Weight as of this encounter: 209 lb (94.8 kg).  Risk Assessment: Allergies: Reviewed. He is allergic to topamax [topiramate], keppra [levetiracetam], neurontin [gabapentin], and cymbalta [duloxetine hcl].  Allergy Precautions: None required Coagulopathies: Reviewed. None identified.  Blood-thinner therapy: None at this time Active Infection(s): Reviewed. None identified. Jeff [name] is afebrile  Site Confirmation: Jeff [name] was asked to confirm the procedure and laterality before marking the site Procedure checklist: Completed Consent: Before the procedure and under the influence of no sedative(s), amnesic(s), or anxiolytics, the patient was informed of the treatment options, risks and possible complications. To fulfill our ethical and legal obligations, as recommended by the American Medical Association's Code of Ethics, I have informed the patient of my clinical impression; the nature and purpose of the treatment or procedure; the risks, benefits, and possible complications of the intervention; the alternatives, including doing nothing; the  risk(s) and benefit(s) of the alternative treatment(s) or procedure(s); and the risk(s) and benefit(s) of doing nothing. The patient was provided information about the general risks and possible complications associated with the procedure. These may include, but are not limited to: failure to achieve desired goals, infection, bleeding, organ or nerve damage, allergic reactions, paralysis,  and death. In addition, the patient was informed of those risks and complications associated to the procedure, such as failure to decrease pain; infection; bleeding; organ or nerve damage with subsequent damage to sensory, motor, and/or autonomic systems, resulting in permanent pain, numbness, and/or weakness of one or several areas of the body; allergic reactions; (i.e.: anaphylactic reaction); and/or death. Furthermore, the patient was informed of those risks and complications associated with the medications. These include, but are not limited to: allergic reactions (i.e.: anaphylactic or anaphylactoid reaction(s)); adrenal axis suppression; blood sugar elevation that in diabetics may result in ketoacidosis or comma; water retention that in patients with history of congestive heart failure may result in shortness of breath, pulmonary edema, and decompensation with resultant heart failure; weight gain; swelling or edema; medication-induced neural toxicity; particulate matter embolism and blood vessel occlusion with resultant organ, and/or nervous system infarction; and/or aseptic necrosis of one or more joints. Finally, the patient was informed that Medicine is not an exact science; therefore, there is also the possibility of unforeseen or unpredictable risks and/or possible complications that may result in a catastrophic outcome. The patient indicated having understood very clearly. We have given the patient no guarantees and we have made no promises. Enough time was given to the patient to ask questions, all of which were  answered to the patient's satisfaction. Jeff [name] has indicated that he wanted to continue with the procedure. Attestation: I, the ordering provider, attest that I have discussed with the patient the benefits, risks, side-effects, alternatives, likelihood of achieving goals, and potential problems during recovery for the procedure that I have provided informed consent. Date  Time: 03/11/2022  9:19 AM  Pre-Procedure Preparation:  Monitoring: As per clinic protocol. Respiration, ETCO2, SpO2, BP, heart rate and rhythm monitor placed and checked for adequate function Safety Precautions: Patient was assessed for positional comfort and pressure points before starting the procedure. Time-out: I initiated and conducted the "Time-out" before starting the procedure, as per protocol. The patient was asked to participate by confirming the accuracy of the "Time Out" information. Verification of the correct person, site, and procedure were performed and confirmed by me, the nursing staff, and the patient. "Time-out" conducted as per Joint Commission's Universal Protocol (UP.01.01.01). Time: 1022  Description/Narrative of Procedure:          Region: Distal lower extremity Target Area: Sensory peripheral nerves affected by diabetic peripheral neuropathy Site: Feet Approach: Percutaneous  No./Series: Not applicable  Type: Percutaneous  Purpose: Therapeutic  Region: Distal lower extremities  Description of the Procedure: Protocol guidelines were followed. The patient was assisted into a comfortable position.  Informed consent was obtained in the patient monitored in the usual manner.  All questions were answered prior to the procedure.  They Qutenza patches were applied to the affected area and then covered with the wrap.  The Patient was kept under observation until the treatment was completed.  The patches were removed and the treated area was inspected.  Vitals:   03/11/22 0952 03/11/22 0957 03/11/22 1002  03/11/22 1007  BP: 131/86 120/88 (!) 146/92 (!) 151/92  Pulse: 67 66 67 69  Resp: _0 Temp:      TempSrc:      SpO2: 98% 98% 98% 99%  Weight:      Height:         Start Time: 1022 hrs. End Time: 1102 hrs.  Post-operative Assessment:  Post-procedure Vital Signs:  Pulse/HCG Rate: 69  Temp:  (!) 97.3  F (36.3 C) Resp: 16 BP:  (!) 151/92 SpO2: 99 %  EBL: None  Complications: No immediate post-treatment complications observed by team, or reported by patient.  Note: The patient tolerated the entire procedure well. A repeat set of vitals were taken after the procedure and the patient was kept under observation following institutional policy, for this type of procedure. Post-procedural neurological assessment was performed, showing return to baseline, prior to discharge. The patient was provided with post-procedure discharge instructions, including a section on how to identify potential problems. Should any problems arise concerning this procedure, the patient was given instructions to immediately contact us, at any time, without hesitation. In any case, we plan to contact the patient by telephone for a follow-up status report regarding this interventional procedure.  Comments:  No additional relevant information.  Plan of Care  Orders:  Orders Placed This Encounter  Procedures   DG PAIN CL[facility] C-ARM 1-60 MIN NO REPORT    Intraoperative interpretation by procedural physician at [place].    Standing Status:   Standing    Number of Occurrences:   1    Order Specific Question:   Reason for exam:    Answer:   Assistance in needle guidance and placement for procedures requiring needle placement in or near specific anatomical locations not easily accessible without such assistance.     Medications ordered for procedure: Meds ordered this encounter  Medications   lidocaine (XYLOCAINE) 2 % (with pres) injection 400 mg   diazepam (VALIUM) tablet 5 mg    Make  sure Flumazenil is available in the pyxis when using this medication. If oversedation occurs, administer 0.2 mg IV over 15 sec. If after 45 sec no response, administer 0.2 mg again over 1 min; may repeat at 1 min intervals; not to exceed 4 doses (1 mg)   dexamethasone (DECADRON) injection 10 mg   dexamethasone (DECADRON) injection 10 mg   ropivacaine (PF) 2 mg/mL (0.2%) (NAROPIN) injection 9 mL   capsaicin topical system 8 % patch 1 patch   diazepam (VALIUM) tablet 5 mg    Make sure Flumazenil is available in the pyxis when using this medication. If oversedation occurs, administer 0.2 mg IV over 15 sec. If after 45 sec no response, administer 0.2 mg again over 1 min; may repeat at 1 min intervals; not to exceed 4 doses (1 mg)   Medications administered: We administered lidocaine, diazepam, dexamethasone, dexamethasone, ropivacaine (PF) 2 mg/mL (0.2%), capsaicin topical system, and diazepam.  See the medical record for exact dosing, route, and time of administration.  Follow-up plan:   Return in about 4 weeks (around 04/08/2022) for in person post procedure eval.     Recent Visits Date Type Provider Dept  02/24/22 Office Visit Gillis Santa, MD [facility]  Showing recent visits within past 90 days and meeting all other requirements Today's Visits Date Type Provider Dept  03/11/22 Procedure visit Gillis Santa, MD [facility]  Showing today's visits and meeting all other requirements Future Appointments Date Type Provider Dept  04/08/22 Appointment Gillis Santa, MD [facility]  Showing future appointments within next 90 days and meeting all other requirements  Disposition: Discharge home  Discharge (Date  Time): 03/11/2022; 1115 hrs.   Primary Care Physician: Jerrol Banana., MD Location: [facility] Note by: Gillis Santa, MD Date: 03/11/2022; Time: 12:13 PM  Disclaimer:  Medicine is not an exact science. The only  guarantee in medicine is that  nothing is guaranteed. It is important to note that the decision to proceed with this intervention was based on the information collected from the patient. The Data and conclusions were drawn from the patient's questionnaire, the interview, and the physical examination. Because the information was provided in large part by the patient, it cannot be guaranteed that it has not been purposely or unconsciously manipulated. Every effort has been made to obtain as much relevant data as possible for this evaluation. It is important to note that the conclusions that lead to this procedure are derived in large part from the available data. Always take into account that the treatment will also be dependent on availability of resources and existing treatment guidelines, considered by other Pain Management Practitioners as being common knowledge and practice, at the time of the intervention. For Medico-Legal purposes, it is also important to point out that variation in procedural techniques and pharmacological choices are the acceptable norm. The indications, contraindications, technique, and results of the above procedure should only be interpreted and judged by a Board-Certified Interventional Pain Specialist with extensive familiarity and expertise in the same exact procedure and technique.

## 2022-03-11 NOTE — Progress Notes (Signed)
PROVIDER NOTE: Interpretation of information contained herein should be left to medically-trained personnel. Specific patient instructions are provided elsewhere under "Patient Instructions" section of medical record. This document was created in part using STT-dictation technology, any transcriptional errors that may result from this process are unintentional.  Patient: Jeff [name] Type: Established DOB: Aug 15, 1960 MRN: [id number] PCP: Jerrol Banana., MD  Service: Procedure DOS: 03/11/2022 Setting: Ambulatory Location: Ambulatory outpatient facility Delivery: Face-to-face Provider: Gillis Santa, MD Specialty: Interventional Pain Management Specialty designation: 09 Location: Outpatient facility Ref. Prov.: Gillis Santa, MD    Primary Reason for Visit: Interventional Pain Management Treatment. CC: Neck Pain (Bilateral ) and Foot Pain (Bilateral )   Procedure:           Type: Cervical Facet Medial Branch Block(s) #1  Laterality: Bilateral  Level: C4, C5, C6, & C7 Medial Branch Level(s). Injecting these levels blocks the C4-5, C5-6, and C6-7 cervical facet joints.  Imaging: Fluoroscopic guidance Anesthesia: Local anesthesia (1-2% Lidocaine) Anxiolysis: None                 Sedation: Minimal Sedation              .  DOS: 03/11/2022  Performed by: Gillis Santa, MD  Purpose: Diagnostic/Therapeutic Indications: Cervicalgia (cervical spine axial pain) severe enough to impact quality of life or function.  Cervical facet syndrome   NAS-11 Pain score:   Pre-procedure: 10-Worst pain ever/10   Post-procedure: 10-Worst pain ever (Md aware and Valium given)/10     Position / Prep / Materials:  Position: Prone. Head in cradle. C-spine slightly flexed. Prep solution: DuraPrep (Iodine Povacrylex [0.7% available iodine] and Isopropyl Alcohol, 74% w/w) Prep Area: Posterior Cervico-thoracic Region. From occipital ridge to tip of scapula, and from shoulder to shoulder. Entire posterior  and lateral neck surface. Materials:  Tray: Block Needle(s):  Type: Spinal  Gauge (G): 22"  Length: 3.5-in  Qty:4  Pre-op H&P Assessment:  [name] is a 61 y.o. (year old), male patient, seen today for interventional treatment. 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 [name] has a current medication list which includes the following prescription(s): acetaminophen, amlodipine, aripiprazole, empagliflozin, gentamicin cream, ibuprofen, losartan, metformin, metoprolol succinate, pregabalin, rosuvastatin, sildenafil, and levetiracetam. His primarily concern today is the Neck Pain (Bilateral ) and Foot Pain (Bilateral )  Initial Vital Signs:  Pulse/HCG Rate: 63  Temp:  (!) 97.3 F (36.3 C) Resp: 16 BP: 137/81 SpO2: 100 %  BMI: Estimated body mass index is 31.78 kg/m as calculated from the following:   Height as of this encounter: '5\' 8"'$  (1.727 m).   Weight as of this encounter: 209 lb (94.8 kg).  Risk Assessment: Allergies: Reviewed. He is allergic to topamax [topiramate], keppra [levetiracetam], neurontin [gabapentin], and cymbalta [duloxetine hcl].  Allergy Precautions: None required Coagulopathies: Reviewed. None identified.  Blood-thinner therapy: None at this time Active Infection(s): Reviewed. None identified. [name] is afebrile  Site Confirmation: [name] was asked to confirm  the procedure and laterality before marking the site Procedure checklist: Completed Consent: Before  the procedure and under the influence of no sedative(s), amnesic(s), or anxiolytics, the patient was informed of the treatment options, risks and possible complications. To fulfill our ethical and legal obligations, as recommended by the American Medical Association's Code of Ethics, I have informed the patient of my clinical impression; the nature and purpose of the treatment or procedure; the risks, benefits, and possible complications of the intervention; the alternatives, including doing nothing; the risk(s) and benefit(s) of the alternative treatment(s) or procedure(s); and the risk(s) and benefit(s) of doing nothing. The patient was provided information about the general risks and possible complications associated with the procedure. These may include, but are not limited to: failure to achieve desired goals, infection, bleeding, organ or nerve damage, allergic reactions, paralysis, and death. In addition, the patient was informed of those risks and complications associated to Spine-related procedures, such as failure to decrease pain; infection (i.e.: Meningitis, epidural or intraspinal abscess); bleeding (i.e.: epidural hematoma, subarachnoid hemorrhage, or any other type of intraspinal or peri-dural bleeding); organ or nerve damage (i.e.: Any type of peripheral nerve, nerve root, or spinal cord injury) with subsequent damage to sensory, motor, and/or autonomic systems, resulting in permanent pain, numbness, and/or weakness of one or several areas of the body; allergic reactions; (i.e.: anaphylactic reaction); and/or death. Furthermore, the patient was informed of those risks and complications associated with the medications. These include, but are not limited to: allergic reactions (i.e.: anaphylactic or anaphylactoid reaction(s)); adrenal axis suppression; blood sugar elevation that in diabetics may  result in ketoacidosis or comma; water retention that in patients with history of congestive heart failure may result in shortness of breath, pulmonary edema, and decompensation with resultant heart failure; weight gain; swelling or edema; medication-induced neural toxicity; particulate matter embolism and blood vessel occlusion with resultant organ, and/or nervous system infarction; and/or aseptic necrosis of one or more joints. Finally, the patient was informed that Medicine is not an exact science; therefore, there is also the possibility of unforeseen or unpredictable risks and/or possible complications that may result in a catastrophic outcome. The patient indicated having understood very clearly. We have given the patient no guarantees and we have made no promises. Enough time was given to the patient to ask questions, all of which were answered to the patient's satisfaction. Mr. Burget has indicated that he wanted to continue with the procedure. Attestation: I, the ordering provider, attest that I have discussed with the patient the benefits, risks, side-effects, alternatives, likelihood of achieving goals, and potential problems during recovery for the procedure that I have provided informed consent. Date  Time: 03/11/2022  9:19 AM  Pre-Procedure Preparation:  Monitoring: As per clinic protocol. Respiration, ETCO2, SpO2, BP, heart rate and rhythm monitor placed and checked for adequate function Safety Precautions: Patient was assessed for positional comfort and pressure points before starting the procedure. Time-out: I initiated and conducted the "Time-out" before starting the procedure, as per protocol. The patient was asked to participate by confirming the accuracy of the "Time Out" information. Verification of the correct person, site, and procedure were performed and confirmed by me, the nursing staff, and the patient. "Time-out" conducted as per Joint Commission's Universal Protocol  (UP.01.01.01). Time: 1022  Description/Narrative of Procedure:          Laterality: Bilateral. The procedure was performed in identical fashion on both sides. Targeted Levels:   C4, C5, C6, & C7 Medial Branch Level(s).  Rationale (medical necessity): procedure needed and proper for the diagnosis and/or treatment of the patient's  medical symptoms and needs. Procedural Technique Safety Precautions: Aspiration looking for blood return was conducted prior to all injections. At no point did we inject any substances, as a needle was being advanced. No attempts were made at seeking any paresthesias. Safe injection practices and needle disposal techniques used. Medications properly checked for expiration dates. SDV (single dose vial) medications used. Description of the Procedure: Protocol guidelines were followed. The patient was assisted into a comfortable position. The target area was identified and the area prepped in the usual manner. Skin & deeper tissues infiltrated with local anesthetic. Appropriate amount of time allowed to pass for local anesthetics to take effect. The procedure needles were then advanced to the target area. Proper needle placement secured. Negative aspiration confirmed. Solution injected in intermittent fashion, asking for systemic symptoms every 0.5cc of injectate. The needles were then removed and the area cleansed, making sure to leave some of the prepping solution back to take advantage of its long term bactericidal properties.  Technical description of process:    C4 Medial Branch Nerve Block (MBB): The target area for the C4 dorsal medial articular branch is the lateral concave waist of the articular pillar of C4. Under fluoroscopic guidance, a Quincke needle was inserted until contact was made with os over the postero-lateral aspect of the articular pillar of C4 (target area). After negative aspiration for blood, 84m of the nerve block solution was injected without difficulty  or complication. The needle was removed intact. C5 Medial Branch Nerve Block (MBB): The target area for the C5 dorsal medial articular branch is the lateral concave waist of the articular pillar of C5. Under fluoroscopic guidance, a Quincke needle was inserted until contact was made with os over the postero-lateral aspect of the articular pillar of C5 (target area). After negative aspiration for blood, 269mof the nerve block solution was injected without difficulty or complication. The needle was removed intact. C6 Medial Branch Nerve Block (MBB): The target area for the C6 dorsal medial articular branch is the lateral concave waist of the articular pillar of C6. Under fluoroscopic guidance, a Quincke needle was inserted until contact was made with os over the postero-lateral aspect of the articular pillar of C6 (target area). After negative aspiration for blood, 14m51mf the nerve block solution was injected without difficulty or complication. The needle was removed intact. C7 Medial Branch Nerve Block (MBB): The target for the C7 dorsal medial articular branch lies on the superior-medial tip of the C7 transverse process. Under fluoroscopic guidance, a Quincke needle was inserted until contact was made with os over the postero-lateral aspect of the articular pillar of C7 (target area). After negative aspiration for blood, 14mL114m the nerve block solution was injected without difficulty or complication. The needle was removed intact.   12 cc solution made of 10 cc of 0.2% ropivacaine, 2 cc of Decadron 10 mg/cc.  2 cc injected at each level above bilaterally.   Once the entire procedure was completed, the treated area was cleaned, making sure to leave some of the prepping solution back to take advantage of its long term bactericidal properties.  Anatomy Reference Guide:       Vitals:   03/11/22 0952 03/11/22 0957 03/11/22 1002 03/11/22 1007  BP: 131/86 120/88 (!) 146/92 (!) 151/92  Pulse: 67 66 67 69   Resp: '15 16 16 16  '$ Temp:      TempSrc:      SpO2: 98% 98% 98% 99%  Weight:  Height:         Start Time: 1022 hrs. End Time: 1102 hrs.  Imaging Guidance (Spinal):          Type of Imaging Technique: Fluoroscopy Guidance (Spinal) Indication(s): Assistance in needle guidance and placement for procedures requiring needle placement in or near specific anatomical locations not easily accessible without such assistance. Exposure Time: Please see nurses notes. Contrast: None used. Fluoroscopic Guidance: I was personally present during the use of fluoroscopy. "Tunnel Vision Technique" used to obtain the best possible view of the target area. Parallax error corrected before commencing the procedure. "Direction-depth-direction" technique used to introduce the needle under continuous pulsed fluoroscopy. Once target was reached, antero-posterior, oblique, and lateral fluoroscopic projection used confirm needle placement in all planes. Images permanently stored in EMR. Interpretation: No contrast injected. I personally interpreted the imaging intraoperatively. Adequate needle placement confirmed in multiple planes. Permanent images saved into the patient's record.  Post-operative Assessment:  Post-procedure Vital Signs:  Pulse/HCG Rate: 69  Temp:  (!) 97.3 F (36.3 C) Resp: 16 BP:  (!) 151/92 SpO2: 99 %  EBL: None  Complications: No immediate post-treatment complications observed by team, or reported by patient.  Note: The patient tolerated the entire procedure well. A repeat set of vitals were taken after the procedure and the patient was kept under observation following institutional policy, for this type of procedure. Post-procedural neurological assessment was performed, showing return to baseline, prior to discharge. The patient was provided with post-procedure discharge instructions, including a section on how to identify potential problems. Should any problems arise concerning this  procedure, the patient was given instructions to immediately contact us, at any time, without hesitation. In any case, we plan to contact the patient by telephone for a follow-up status report regarding this interventional procedure.  Comments:  No additional relevant information.  Plan of Care  5 out of 5 strength bilateral upper extremity: Shoulder abduction, elbow flexion, elbow extension, thumb extension.    Orders:  Orders Placed This Encounter  Procedures   DG PAIN CLINIC C-ARM 1-60 MIN NO REPORT    Intraoperative interpretation by procedural physician at [place].    Standing Status:   Standing    Number of Occurrences:   1    Order Specific Question:   Reason for exam:    Answer:   Assistance in needle guidance and placement for procedures requiring needle placement in or near specific anatomical locations not easily accessible without such assistance.    Medications ordered for procedure: Meds ordered this encounter  Medications   lidocaine (XYLOCAINE) 2 % (with pres) injection 400 mg   diazepam (VALIUM) tablet 5 mg    Make sure Flumazenil is available in the pyxis when using this medication. If oversedation occurs, administer 0.2 mg IV over 15 sec. If after 45 sec no response, administer 0.2 mg again over 1 min; may repeat at 1 min intervals; not to exceed 4 doses (1 mg)   dexamethasone (DECADRON) injection 10 mg   dexamethasone (DECADRON) injection 10 mg   ropivacaine (PF) 2 mg/mL (0.2%) (NAROPIN) injection 9 mL   capsaicin topical system 8 % patch 1 patch   diazepam (VALIUM) tablet 5 mg    Make sure Flumazenil is available in the pyxis when using this medication. If oversedation occurs, administer 0.2 mg IV over 15 sec. If after 45 sec no response, administer 0.2 mg again over 1 min; may repeat at 1 min intervals; not to exceed 4 doses (1  mg)   Medications administered: We administered lidocaine, diazepam, dexamethasone, dexamethasone, ropivacaine (PF) 2  mg/mL (0.2%), capsaicin topical system, and diazepam.  See the medical record for exact dosing, route, and time of administration.  Follow-up plan:   Return in about 4 weeks (around 04/08/2022) for in person post procedure eval.       B/L C4,5,6,7 MBNB #1 & Qutenza 03/11/22   Recent Visits Date Type Provider Dept  02/24/22 Office Visit Gillis Santa, MD [facility]  Showing recent visits within past 90 days and meeting all other requirements Today's Visits Date Type Provider Dept  03/11/22 Procedure visit Gillis Santa, MD [facility]  Showing today's visits and meeting all other requirements Future Appointments Date Type Provider Dept  04/08/22 Appointment Gillis Santa, MD [facility]  Showing future appointments within next 90 days and meeting all other requirements  Disposition: Discharge home  Discharge (Date  Time): 03/11/2022; 1115 hrs.   Primary Care Physician: Jerrol Banana., MD Location: [facility] Note by: Gillis Santa, MD Date: 03/11/2022; Time: 12:11 PM  Disclaimer:  Medicine is not an exact science. The only guarantee in medicine is that nothing is guaranteed. It is important to note that the decision to proceed with this intervention was based on the information collected from the patient. The Data and conclusions were drawn from the patient's questionnaire, the interview, and the physical examination. Because the information was provided in large part by the patient, it cannot be guaranteed that it has not been purposely or unconsciously manipulated. Every effort has been made to obtain as much relevant data as possible for this evaluation. It is important to note that the conclusions that lead to this procedure are derived in large part from the available data. Always take into account that the treatment will also be dependent on availability of resources and existing treatment guidelines, considered by other  Pain Management Practitioners as being common knowledge and practice, at the time of the intervention. For Medico-Legal purposes, it is also important to point out that variation in procedural techniques and pharmacological choices are the acceptable norm. The indications, contraindications, technique, and results of the above procedure should only be interpreted and judged by a Board-Certified Interventional Pain Specialist with extensive familiarity and expertise in the same exact procedure and technique.

## 2022-03-11 NOTE — Patient Instructions (Signed)

## 2022-03-12 ENCOUNTER — Telehealth: Payer: Self-pay | Admitting: *Deleted

## 2022-03-12 NOTE — Telephone Encounter (Signed)
Post procedure call;  patient reports he is better but does still have some pain.  Rational behind numbing agent and steroids discussed.  Patient verbalizes u/o information.

## 2022-03-16 DIAGNOSIS — S066X0D Traumatic subarachnoid hemorrhage without loss of consciousness, subsequent encounter: Secondary | ICD-10-CM | POA: Diagnosis not present

## 2022-03-27 ENCOUNTER — Other Ambulatory Visit: Payer: Self-pay | Admitting: Family Medicine

## 2022-03-27 DIAGNOSIS — I10 Essential (primary) hypertension: Secondary | ICD-10-CM

## 2022-03-27 MED ORDER — AMLODIPINE BESYLATE 5 MG PO TABS: 5.0000 mg | ORAL_TABLET | Freq: Every day | ORAL | 2 refills | Status: DC

## 2022-03-27 NOTE — Addendum Note (Signed)
Addended by: Carlisle Beers on: 03/27/2022 11:47 AM   Modules accepted: Orders

## 2022-03-27 NOTE — Telephone Encounter (Signed)
Cosigned by wrong provider. Reordered and cosigned under Dr Marlan Palau name
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
## 2022-04-08 ENCOUNTER — Encounter: Payer: Self-pay | Admitting: Student in an Organized Health Care Education/Training Program

## 2022-04-08 ENCOUNTER — Ambulatory Visit
Payer: Medicare Other | Attending: Student in an Organized Health Care Education/Training Program | Admitting: Student in an Organized Health Care Education/Training Program

## 2022-04-08 VITALS — BP 123/88 | HR 76 | Temp 97.0°F | Resp 18 | Ht 68.0 in | Wt 210.0 lb

## 2022-04-08 DIAGNOSIS — G894 Chronic pain syndrome: Secondary | ICD-10-CM | POA: Insufficient documentation

## 2022-04-08 DIAGNOSIS — E1140 Type 2 diabetes mellitus with diabetic neuropathy, unspecified: Secondary | ICD-10-CM | POA: Diagnosis not present

## 2022-04-08 DIAGNOSIS — E1142 Type 2 diabetes mellitus with diabetic polyneuropathy: Secondary | ICD-10-CM | POA: Insufficient documentation

## 2022-04-08 NOTE — Progress Notes (Signed)
PROVIDER NOTE: Information contained herein reflects review and annotations entered in association with encounter. Interpretation of such information and data should be left to medically-trained personnel. Information provided to patient can be located elsewhere in the medical record under "Patient Instructions". Document created using STT-dictation technology, any transcriptional errors that may result from process are unintentional.    Patient: Jeff [name]  Service Category: E/M  Provider: Gillis Santa, MD  DOB: 27-Sep-1960  DOS: 04/08/2022  Referring Provider: Jerrol Banana.,*  MRN: [id number]  Specialty: Interventional Pain Management  PCP: Jerrol Banana., MD  Type: Established Patient  Setting: Ambulatory outpatient    Location: Office  Delivery: Face-to-face     HPI  [name], a 61 y.o. year old male, is here today because of his Type 2 diabetes mellitus with diabetic polyneuropathy, without long-term current use of insulin (B and E) [E11.42]. Mr. Amos primary complain today is Neck Pain and Shoulder Pain Last encounter: My last encounter with him was on 03/11/2022. Pertinent problems: Mr. Panico has Bilateral primary osteoarthritis of knee; S/P arthroscopic surgery of left knee; and Chronic painful diabetic neuropathy ([place]) on their pertinent problem list. Pain Assessment: Severity of Chronic pain is reported as a 10-Worst pain ever/10. Location: Neck Right, Left/radiates into both shoulders. Onset: More than a month ago. Quality: Stabbing. Timing: Constant. Modifying factor(s): procedure helped first day. Vitals:  height is 5' 8" (1.727 m) and weight is 210 lb (95.3 kg). His temperature is 97 F (36.1 C) (abnormal). His blood pressure is 123/88 and his pulse is 76. His respiration is 18 and oxygen saturation is 97%.   Reason for encounter: post-procedure evaluation and assessment.    Post-procedure evaluation   Qutenza Neurolysis #1  Laterality:  Bilateral Area  treated: Feet Imaging Guidance: None Anesthesia/analgesia/anxiolysis/sedation: None required Medication (Right): Qutenza patches Medication (Left): Qutenza patches Date: 03/11/2022 Performed by: Gillis Santa, MD  Chronic painful diabetic neuropathy  NAS-11 Pain score:   Pre-procedure: 10-Worst pain ever/10   Post-procedure: 10-Worst pain ever (Md aware and Valium given)/10   Procedure Type: Cervical Facet Medial Branch Block(s) #1  Laterality: Bilateral  Level: C4, C5, C6, & C7 Medial Branch Level(s). Injecting these levels blocks the C4-5, C5-6, and C6-7 cervical facet joints.  Imaging: Fluoroscopic guidance Anesthesia: Local anesthesia (1-2% Lidocaine) Anxiolysis: None                 Sedation: Minimal Sedation              .  DOS: 03/11/2022  Performed by: Gillis Santa, MD  Unfortunately, no benefit from his diagnostic cervical facet medial branch nerve blocks. I have limited options for this patient.  We discussed lidocaine infusion.  Risk and benefits reviewed and patient like to proceed.  QTc less than 500 ms on recent EKG done in July. He had a cervical spinal cord stimulator in place which he states is not effective.  ROS  Constitutional: Denies any fever or chills Gastrointestinal: No reported hemesis, hematochezia, vomiting, or acute GI distress Musculoskeletal:  Cervical spine pain, occipital neuralgia Neurological: No reported episodes of acute onset apraxia, aphasia, dysarthria, agnosia, amnesia, paralysis, loss of coordination, or loss of consciousness  Medication Review  ARIPiprazole, acetaminophen, amLODipine, empagliflozin, gentamicin cream, ibuprofen, levETIRAcetam, losartan, metFORMIN, metoprolol succinate, pregabalin, rosuvastatin, and sildenafil  History Review  Allergy: Mr. Licciardi is allergic to topamax [topiramate], keppra [levetiracetam], neurontin [gabapentin], and cymbalta [duloxetine hcl]. Drug: Mr. Poke  reports no history of drug use. Alcohol:  reports no history of alcohol use. Tobacco:  reports that he quit smoking about 13 years ago. His smoking use included cigarettes. He has a 45.00 pack-year smoking history. He quit smokeless tobacco use about 26 years ago. Social: Mr. Ahrendt  reports that he quit smoking about 13 years ago. His smoking use included cigarettes. He has a 45.00 pack-year smoking history. He quit smokeless tobacco use about 26 years ago. He reports that he does not drink alcohol and does not use drugs. Medical:  has a past medical history of Actinic keratosis, Basal cell carcinoma (12/23/2021), Cancer ([place]), Depression, Diabetes mellitus without complication ([employer]), Dyspnea, Fatigue, History of basal cell carcinoma ([facility]) (05/21/2016), dysplastic nevus (09/26/2013), Hyperlipidemia, Hypertension, Hypogonadism male, Neuromuscular disorder ([place]), Neuropathy, PAD (peripheral artery disease) ([place]), Sleep apnea, Tinnitus, and Tuberculosis. Surgical: Mr. Crescenzo  has a past surgical history that includes Cervical fusion (2012); Carpal tunnel release (Bilateral); Knee arthroscopy (Left); Lumbar laminectomy/decompression microdiscectomy (06/22/2011); left heart catheterization with coronary angiogram (N/A, 01/31/2013); Vasectomy (1991); Back surgery (2012); Cardiac catheterization (01/31/2013); Coronary angioplasty (2014); Cataract extraction w/PHACO (Left, 03/12/2016); Cataract extraction (Right, 2014); ulnar (N/A); Pain stimulator; Amputation toe (Right, 03/04/2018); IR ANGIO INTRA EXTRACRAN SEL INTERNAL CAROTID BILAT MOD SED (02/01/2022); and IR ANGIO VERTEBRAL SEL VERTEBRAL UNI R MOD SED (02/01/2022). Family: family history includes Atrial fibrillation in his mother; Cancer in his maternal grandmother and maternal uncle; Coronary artery disease in his father; Dementia in his mother; Diabetes in his father; Heart disease in his father; Hyperlipidemia in his father; Hypertension in his father and mother; Migraines in his daughter; Transient ischemic  attack in his mother.  Laboratory Chemistry Profile   Renal Lab Results  Component Value Date   BUN 12 02/09/2022   CREATININE 0.99 02/09/2022   BCR 10 01/19/2022   GFRAA 71 03/21/2020   GFRNONAA >60 02/09/2022    Hepatic Lab Results  Component Value Date   AST 21 02/09/2022   ALT 25 02/09/2022   ALBUMIN 3.9 02/09/2022   ALKPHOS 63 02/09/2022    Electrolytes Lab Results  Component Value Date   NA 139 02/09/2022   K 3.8 02/09/2022   CL 106 02/09/2022   CALCIUM 8.9 02/09/2022   MG 1.9 02/09/2022   PHOS 2.7 (L) 01/19/2022    Bone Lab Results  Component Value Date   TESTOSTERONE 270 12/08/2021    Inflammation (CRP: Acute Phase) (ESR: Chronic Phase) No results found for: "CRP", "ESRSEDRATE", "LATICACIDVEN"       Note: Above Lab results reviewed.  Physical Exam  General appearance: Well nourished, well developed, and well hydrated. In no apparent acute distress Mental status: Alert, oriented x 3 (person, place, & time)       Respiratory: No evidence of acute respiratory distress Eyes: PERLA Vitals: BP 123/88   Pulse 76   Temp (!) 97 F (36.1 C)   Resp 18   Ht 5' 8" (1.727 m)   Wt 210 lb (95.3 kg)   SpO2 97%   BMI 31.93 kg/m  BMI: Estimated body mass index is 31.93 kg/m as calculated from the following:   Height as of this encounter: 5' 8" (1.727 m).   Weight as of this encounter: 210 lb (95.3 kg). Ideal: Ideal body weight: 68.4 kg (150 lb 12.7 oz) Adjusted ideal body weight: 79.1 kg (174 lb 7.6 oz)    Cervical Spine Area Exam  Skin & Axial Inspection: Well healed scar from previous spine surgery detected Alignment: Asymmetric Functional ROM: Pain restricted ROM, bilaterally Stability: No  instability detected Muscle Tone/Strength: Guarding observed Sensory (Neurological): Neurogenic pain pattern Palpation: Complains of area being tender to palpation             Upper Extremity (UE) Exam      Side: Right upper extremity   Side: Left upper extremity   Skin & Extremity Inspection: Skin color, temperature, and hair growth are WNL. No peripheral edema or cyanosis. No masses, redness, swelling, asymmetry, or associated skin lesions. No contractures.   Skin & Extremity Inspection: Skin color, temperature, and hair growth are WNL. No peripheral edema or cyanosis. No masses, redness, swelling, asymmetry, or associated skin lesions. No contractures.  Functional ROM: Unrestricted ROM           Functional ROM: Pain restricted ROM for shoulder and elbow  Muscle Tone/Strength: Functionally intact. No obvious neuro-muscular anomalies detected.   Muscle Tone/Strength: Functionally intact. No obvious neuro-muscular anomalies detected.  Sensory (Neurological): Dermatomal pain pattern           Sensory (Neurological): Dermatomal pain pattern          Palpation: No palpable anomalies               Palpation: No palpable anomalies              Provocative Test(s):  Phalen's test: deferred Tinel's test: deferred Apley's scratch test (touch opposite shoulder):  Action 1 (Across chest): Decreased ROM Action 2 (Overhead): Decreased ROM Action 3 (LB reach): Decreased ROM     Provocative Test(s):  Phalen's test: deferred Tinel's test: deferred Apley's scratch test (touch opposite shoulder):  Action 1 (Across chest): Decreased ROM Action 2 (Overhead): Decreased ROM Action 3 (LB reach): Decreased ROM      Assessment   Diagnosis Status  1. Type 2 diabetes mellitus with diabetic polyneuropathy, without long-term current use of insulin ([place])   2. Chronic painful diabetic neuropathy ([place])   3. Chronic pain syndrome    Persistent Persistent Persistent   Updated Problems: Problem  Bilateral Primary Osteoarthritis of Knee  S/P Arthroscopic Surgery of Left Knee  Chronic Painful Diabetic Neuropathy ([facility])    Plan of Care    Orders:  Orders Placed This Encounter  Procedures   LIDOCAINE INFUSION    Standing Status:   Future    Standing Expiration Date:    07/08/2022    Scheduling Instructions:     Laterality: Does not apply     Level(s): Peripheral vascular access     Sedation: With IV Sedation     Scheduling Timeframe: As soon as pre-approved     NOTE: Continuous cardiovascular monitoring required (ECG, NIBPM, SpO2)    Order Specific Question:   Where will this procedure be performed?    Answer:   [facility]   Follow-up plan:   Return in about 2 weeks (around 04/22/2022) for Lidocaine gtt.     B/L C4,5,6,7 MBNB #1 & Qutenza 03/11/22    Recent Visits Date Type Provider Dept  03/11/22 Procedure visit Gillis Santa, MD [facility]  02/24/22 Office Visit Gillis Santa, MD [facility]  Showing recent visits within past 90 days and meeting all other requirements Today's Visits Date Type Provider Dept  04/08/22 Office Visit Gillis Santa, MD [facility]  Showing today's visits and meeting all other requirements Future Appointments No visits were found meeting these conditions. Showing future appointments within next 90 days and meeting all other requirements  I discussed the assessment and treatment plan with the patient. The  patient was provided an opportunity to ask questions and all were answered. The patient agreed with the plan and demonstrated an understanding of the instructions.  Patient advised to call back or seek an in-person evaluation if the symptoms or condition worsens.  Duration of encounter: 58mnutes.  Total time on encounter, as per AMA guidelines included both the face-to-face and non-face-to-face time personally spent by the physician and/or other qualified health care professional(s) on the day of the encounter (includes time in activities that require the physician or other qualified health care professional and does not include time in activities normally performed by clinical staff). Physician's time may include the following activities when performed: preparing to see the  patient (eg, review of tests, pre-charting review of records) obtaining and/or reviewing separately obtained history performing a medically appropriate examination and/or evaluation counseling and educating the patient/family/caregiver ordering medications, tests, or procedures referring and communicating with other health care professionals (when not separately reported) documenting clinical information in the electronic or other health record independently interpreting results (not separately reported) and communicating results to the patient/ family/caregiver care coordination (not separately reported)  Note by: BGillis Santa MD Date: 04/08/2022; Time: 3:16 PM

## 2022-04-08 NOTE — Progress Notes (Signed)
Safety precautions to be maintained throughout the outpatient stay will include: orient to surroundings, keep bed in low position, maintain call bell within reach at all times, provide assistance with transfer out of bed and ambulation.  

## 2022-04-09 ENCOUNTER — Encounter: Payer: Self-pay | Admitting: Family Medicine

## 2022-04-10 ENCOUNTER — Other Ambulatory Visit: Payer: Self-pay | Admitting: *Deleted

## 2022-04-10 DIAGNOSIS — M47812 Spondylosis without myelopathy or radiculopathy, cervical region: Secondary | ICD-10-CM

## 2022-04-10 DIAGNOSIS — M5010 Cervical disc disorder with radiculopathy, unspecified cervical region: Secondary | ICD-10-CM

## 2022-04-10 DIAGNOSIS — M5412 Radiculopathy, cervical region: Secondary | ICD-10-CM

## 2022-04-22 ENCOUNTER — Encounter: Payer: Self-pay | Admitting: Student in an Organized Health Care Education/Training Program

## 2022-04-22 ENCOUNTER — Ambulatory Visit
Payer: Medicare Other | Attending: Student in an Organized Health Care Education/Training Program | Admitting: Student in an Organized Health Care Education/Training Program

## 2022-04-22 ENCOUNTER — Ambulatory Visit: Payer: Medicare Other | Admitting: Student in an Organized Health Care Education/Training Program

## 2022-04-22 DIAGNOSIS — E1142 Type 2 diabetes mellitus with diabetic polyneuropathy: Secondary | ICD-10-CM | POA: Diagnosis not present

## 2022-04-22 DIAGNOSIS — E1140 Type 2 diabetes mellitus with diabetic neuropathy, unspecified: Secondary | ICD-10-CM | POA: Insufficient documentation

## 2022-04-22 DIAGNOSIS — G894 Chronic pain syndrome: Secondary | ICD-10-CM | POA: Diagnosis not present

## 2022-04-22 MED ORDER — LIDOCAINE IN D5W 4-5 MG/ML-% IV SOLN
4.0000 mg/min | INTRAVENOUS | Status: AC
  Administered 2022-04-22: 4 mg/min via INTRAVENOUS

## 2022-04-22 NOTE — Progress Notes (Signed)
Safety precautions to be maintained throughout the outpatient stay will include: orient to surroundings, keep bed in low position, maintain call bell within reach at all times, provide assistance with transfer out of bed and ambulation.  

## 2022-04-22 NOTE — Progress Notes (Signed)
PROVIDER NOTE: Interpretation of information contained herein should be left to medically-trained personnel. Specific patient instructions are provided elsewhere under "Patient Instructions" section of medical record. This document was created in part using STT-dictation technology, any transcriptional errors that may result from this process are unintentional.  Patient: Jeff [name] Type: Established DOB: 1960/08/04 MRN: [id number] PCP: Jerrol Banana., MD  Service: Procedure DOS: 04/22/2022 Setting: Ambulatory Location: Ambulatory outpatient facility Delivery: Face-to-face Provider: Gillis Santa, MD Specialty: Interventional Pain Management Specialty designation: 09 Location: Outpatient facility Ref. Prov.: Gillis Santa, MD    Primary Reason for Visit: Interventional Pain Management Treatment. CC: Neck Pain    Procedure:            Type: Palliative Intravenous lidocaine infusion Region: Systemic Level: Upper Extremity IV access Laterality: Please see nurses note.     1. Type 2 diabetes mellitus with diabetic polyneuropathy, without long-term current use of insulin ([place])   2. Chronic painful diabetic neuropathy ([place])   3. Chronic pain syndrome    NAS-11 Pain score:   Pre-procedure: 10-Worst pain ever/10   Post-procedure: 10-Worst pain ever/10     Pre-op H&P Assessment:  Jeff [name] is a 61 y.o. (year old), male patient, seen today for interventional treatment. 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 Jeff [name] has a current medication list which includes the following prescription(s): acetaminophen, amlodipine, aripiprazole, empagliflozin, gentamicin cream, ibuprofen, losartan, metformin, metoprolol succinate, pregabalin, rosuvastatin, sildenafil, and levetiracetam. His primarily concern today is the Neck Pain  Initial Vital Signs:  Pulse/HCG Rate:  (!) 55   Temp: (!) 97 F (36.1 C) Resp: 16 BP: 131/70 SpO2: 99 %  BMI: Estimated body mass index is 32.84 kg/m as calculated from the following:   Height as of this encounter: '5\' 8"'$  (1.727 m).   Weight as of this encounter: 216 lb (98 kg).  Risk Assessment: Allergies: Reviewed. He is allergic to topamax [topiramate], keppra [levetiracetam], neurontin [gabapentin], and cymbalta [duloxetine hcl].  Allergy Precautions: None required Coagulopathies: Reviewed. None identified.  Blood-thinner therapy: None at this time Active Infection(s): Reviewed. None identified. Jeff [name] is afebrile  Site Confirmation: Jeff [name] was asked to confirm the procedure and laterality before marking the site Procedure checklist: Completed Consent: Before the procedure and under the influence of no sedative(s), amnesic(s), or anxiolytics, the patient was informed of the treatment options, risks and possible complications. To fulfill our ethical and legal obligations, as recommended by the American Medical Association's Code of Ethics, I have informed the patient of my clinical impression; the nature and purpose of the treatment or procedure; the risks, benefits, and possible complications of the intervention; the alternatives, including doing nothing; the risk(s) and benefit(s) of the alternative treatment(s) or procedure(s); and the risk(s) and benefit(s) of doing nothing. The patient was provided information about the general risks and  possible complications associated with any invasive procedure. These may include, but are not limited to: failure to achieve desired goals; pain; worsening of initial condition; infections; bleeding; organ or nerve damage; allergic reactions; and death. In addition, the  patient was informed of those risks and complications associated to this procedure, such as failure to decrease pain; infection; bleeding; phlebitis; vascular extravasation; tissue necrosis; tenderness at IV access site; skin or nerve damage with subsequent damage to sensory, motor, and/or autonomic systems; worsening of the pain; persistent pain, numbness, and/or weakness of one or several areas of the body; cardiac dysrhythmias; seizure; stroke; and/or death. Furthermore, the patient was informed of those risks and complications associated with the medications used during the procedure. These include, but are not limited to: allergic reactions (i.e.: anaphylactic or anaphylactoid reactions); cardiac conduction blockade; poisoning; toxicity; CNS depression; cardiovascular depression and collapse; muscle twitching; tonic-clonic seizures; convulsions; loss of consciousness; coma; respiratory depression; arrest; and/or death. Finally, the patient was informed that Medicine is not an exact science; therefore, there is also the possibility of unforeseen or unpredictable risks and/or possible complications that may result in a catastrophic outcome. The patient indicated having understood very clearly. We have given the patient no guarantees and we have made no promises. Enough time was given to the patient to ask questions, all of which were answered to the patient's satisfaction. Jeff [name] has indicated that he wanted to continue with the procedure. Attestation: I, the ordering provider, attest that I have discussed with the patient the benefits, risks, side-effects, alternatives, likelihood of achieving goals, and potential problems during recovery  for the procedure that I have provided informed consent. Date  Time: 04/22/2022  1:04 PM  Pre-Procedure Preparation:  Monitoring: As per clinic protocol. Respiration, ETCO2, SpO2, BP, heart rate and rhythm monitor placed and checked for adequate function Safety Precautions: Patient was assessed for positional comfort and pressure points before starting the procedure. Time-out: I initiated and conducted the "Time-out" before starting the procedure, as per protocol. The patient was asked to participate by confirming the accuracy of the "Time Out" information. Verification of the correct person, site, and procedure were performed and confirmed by me, the nursing staff, and the patient. "Time-out" conducted as per Joint Commission's Universal Protocol (UP.01.01.01). Time: 1309  Description of Procedure:          Target Area: Intravenous Approach: Intravenous angiocath approach. Area Prepped: Antecubital DuraPrep (Iodine Povacrylex [0.7% available iodine] and Isopropyl Alcohol, 74% w/w) Safety Precautions: Medications properly checked for expiration dates. SDV (single dose vial) medications used.  Dose Calculation: Lidocaine preparation: 2 grams of IV lidocaine in 500 mL of Lactated Ringer's(LR) (4 mg/mL) (0.4% Lidocaine) Maximum Lidocaine Dose: 4 mg/kg x 98 =  392 mg.  Calculated infusion rate: '392mg'$  divided by 4 mg/mL = 4m in 60 minutes (955mhr) Set rate: 9623mr Duration of Infusion: 1 hour  Description of the Procedure: Protocol guidelines were followed. The patient was placed in position. Informed consent was obtained.  He was cautioned to be on the look out for prodromal symptoms of a possible impending seizure, such as: new onset tinnitus; perioral, circumoral, or tongue numbness; or a metallic taste in his mouth, lightheadedness; dizziness; visual or auditory disturbances; disorientation; profound drowsiness; or muscle twitching. An IV was started and the patient's dose calculated as  above. The infusion was carried out with a nurse at his side 100% of the time, monitoring his vitals as per protocol. Pre-procedure sedation was started 5-10 minutes before infusion and midazolam was kept at bedside during the entire procedure, along with CPR equipment, as per protocol.  Vitals:   04/22/22 1406 04/22/22 1411 04/22/22 1416 04/22/22 1421  BP: 125/73 137/78 130/78 131/75  Pulse: (!) 57 (!) 54 (!)Marland Kitchen  56 (!) 56  Resp: '14 20 18 16  '$ Temp:      SpO2: 100% 99% 98% 99%  Weight:      Height:        Start Time: 1321 hrs. End Time: 1421 hrs. Materials: IV infusion pump Medication(s): IV Lidocaine.  Imaging Guidance:          Type of Imaging Technique: None used Indication(s): N/A Exposure Time: No patient exposure Contrast: None used. Fluoroscopic Guidance: N/A Ultrasound Guidance: N/A Interpretation: N/A  Antibiotic Prophylaxis:   Anti-infectives (From admission, onward)    None      Indication(s): None identified  Post-operative Assessment:  Post-procedure Vital Signs:  Pulse/HCG Rate:  (!) 56  Temp:  (!) 97 F (36.1 C) Resp: 16 BP: 131/75 SpO2: 99 %  EBL: None  Complications: No immediate post-treatment complications observed by team, or reported by patient.  Note: The patient tolerated the entire procedure well. A repeat set of vitals were taken after the procedure and the patient was kept under observation following institutional policy, for this type of procedure. Post-procedural neurological assessment was performed, showing return to baseline, prior to discharge. The patient was provided with post-procedure discharge instructions, including a section on how to identify potential problems. Should any problems arise concerning this procedure, the patient was given instructions to immediately contact us, at any time, without hesitation. In any case, we plan to contact the patient by telephone for a follow-up status report regarding this interventional  procedure.  Comments:  No additional relevant information.  Plan of Care   Procedure Orders    No procedure(s) ordered today    Medications ordered for procedure: Meds ordered this encounter  Medications   lidocaine (cardiac) 2000 mg in dextrose 5% 500 mL ('4mg'$ /mL) IV infusion    IV Lidocaine 2 grams in 500 mL of Lactated Ringer's(LR). (4 mg/mL) (0.4% Lidocaine)   Medications administered: We administered lidocaine.  See the medical record for exact dosing rate.  New Prescriptions   No medications on file   Disposition: Discharge home  Discharge Date & Time: 04/22/2022; 1430 hrs.   Physician-requested Follow-up: Return in about 4 weeks (around 05/20/2022) for Post Procedure Evaluation, virtual. Recent Visits Date Type Provider Dept  04/08/22 Office Visit Gillis Santa, MD [facility]  03/11/22 Procedure visit Gillis Santa, MD [facility]  02/24/22 Office Visit Gillis Santa, MD [facility]  Showing recent visits within past 90 days and meeting all other requirements Today's Visits Date Type Provider Dept  04/22/22 Procedure visit Gillis Santa, MD [facility]  Showing today's visits and meeting all other requirements Future Appointments Date Type Provider Dept  05/21/22 Appointment Gillis Santa, MD [facility]  Showing future appointments within next 90 days and meeting all other requirements  Primary Care Physician: Jerrol Banana., MD Location: [facility] Note by: Gillis Santa, MD Date: 04/22/2022; Time: 2:53 PM  Disclaimer:  Medicine is not an exact science. The only guarantee in medicine is that nothing is guaranteed. It is important to note that the decision to proceed with this intervention was based on the information collected from the patient. The Data and conclusions were drawn from the patient's questionnaire, the interview, and the physical examination. Because the  information was provided in large part by the patient, it cannot be guaranteed that it has not been purposely or unconsciously manipulated. Every effort has been made to obtain as much relevant data as possible for  this evaluation. It is important to note that the conclusions that lead to this procedure are derived in large part from the available data. Always take into account that the treatment will also be dependent on availability of resources and existing treatment guidelines, considered by other Pain Management Practitioners as being common knowledge and practice, at the time of the intervention. For Medico-Legal purposes, it is also important to point out that variation in procedural techniques and pharmacological choices are the acceptable norm. The indications, contraindications, technique, and results of the above procedure should only be interpreted and judged by a Board-Certified Interventional Pain Specialist with extensive familiarity and expertise in the same exact procedure and technique.

## 2022-04-23 ENCOUNTER — Telehealth: Payer: Self-pay

## 2022-04-23 NOTE — Telephone Encounter (Signed)
Post procedure follow up.  LM 

## 2022-04-29 ENCOUNTER — Other Ambulatory Visit: Payer: Self-pay | Admitting: Family Medicine

## 2022-04-30 ENCOUNTER — Other Ambulatory Visit: Payer: Self-pay | Admitting: Family Medicine

## 2022-04-30 MED ORDER — PREGABALIN 100 MG PO CAPS: 100.0000 mg | ORAL_CAPSULE | Freq: Three times a day (TID) | ORAL | 5 refills | Status: DC

## 2022-05-21 ENCOUNTER — Ambulatory Visit
Payer: Medicare Other | Attending: Student in an Organized Health Care Education/Training Program | Admitting: Student in an Organized Health Care Education/Training Program

## 2022-05-21 DIAGNOSIS — E1140 Type 2 diabetes mellitus with diabetic neuropathy, unspecified: Secondary | ICD-10-CM

## 2022-05-21 NOTE — Progress Notes (Signed)
I attempted to call the patient however no response.  -Dr Hopie Pellegrin  

## 2022-05-27 ENCOUNTER — Ambulatory Visit (INDEPENDENT_AMBULATORY_CARE_PROVIDER_SITE_OTHER): Payer: Medicare Other | Admitting: Physician Assistant

## 2022-05-27 ENCOUNTER — Ambulatory Visit: Payer: Medicare Other | Admitting: Family Medicine

## 2022-05-27 ENCOUNTER — Encounter: Payer: Self-pay | Admitting: Physician Assistant

## 2022-05-27 VITALS — BP 140/82 | HR 67 | Wt 211.3 lb

## 2022-05-27 DIAGNOSIS — G4733 Obstructive sleep apnea (adult) (pediatric): Secondary | ICD-10-CM | POA: Insufficient documentation

## 2022-05-27 DIAGNOSIS — H608X3 Other otitis externa, bilateral: Secondary | ICD-10-CM | POA: Diagnosis not present

## 2022-05-27 DIAGNOSIS — E1140 Type 2 diabetes mellitus with diabetic neuropathy, unspecified: Secondary | ICD-10-CM | POA: Diagnosis not present

## 2022-05-27 MED ORDER — CIPROFLOXACIN-DEXAMETHASONE 0.3-0.1 % OT SUSP: 4.0000 [drp] | Freq: Two times a day (BID) | OTIC | 0 refills | Status: DC

## 2022-05-27 MED ORDER — PREGABALIN 100 MG PO CAPS: ORAL_CAPSULE | ORAL | 3 refills | Status: DC

## 2022-05-27 NOTE — Assessment & Plan Note (Signed)
Refilled for correct sig. Pt takes 2 capsules in AM and 1 capsule in PM

## 2022-05-27 NOTE — Assessment & Plan Note (Signed)
Historically. Recent home sleep study reported no evidence of OSA. Pt has lost weight since his past study. Pt advised we can repeat at a center, or not use CPAP and monitor sleepiness.  He will contact office if he wishes to repeat at a center

## 2022-05-27 NOTE — Progress Notes (Signed)
I,Sha'taria Tyson,acting as a Education administrator for [employer], PA-C.,have documented all relevant documentation on the behalf of Mikey Kirschner, PA-C,as directed by  Mikey Kirschner, PA-C while in the presence of Mikey Kirschner, PA-C.   Established patient visit   Patient: Jeff [name]   DOB: 06-07-1961   61 y.o. Male  MRN: [id number] Visit Date: 05/27/2022  Today's healthcare provider: Mikey Kirschner, PA-C   C. Ear pain, lyrica refill, sleep study  Subjective    Ear Drainage  There is pain in the right ear. This is a new problem. The current episode started in the past 7 days. The problem occurs every few minutes. There has been no fever. The patient is experiencing no pain. Pertinent negatives include no coughing or headaches. Associated symptoms comments: Itching (both ears). He has tried nothing for the symptoms.  Denies nasal congestion, headache, sore throat. Reports constant tinnitus in R ear but has worsened over the past week. Reports muffled hearing.  Pt needs lyria refill-- reports he takes 100 mg, 2 capsules in AM and 1 in the evening.   Pt would like to know results of sleep study. H/o sleep apnea w/ cpap use, but cpap had broken. Medications: Outpatient Medications Prior to Visit  Medication Sig   acetaminophen (TYLENOL) 500 MG tablet Take 1 tablet (500 mg total) by mouth every 6 (six) hours as needed for mild pain.   amLODipine (NORVASC) 5 MG tablet Take 1 tablet (5 mg total) by mouth daily.   ARIPiprazole (ABILIFY) 5 MG tablet TAKE 1 TABLET BY MOUTH EVERY DAY   empagliflozin (JARDIANCE) 25 MG TABS tablet Take 1 tablet (25 mg total) by mouth daily.   gentamicin cream (GARAMYCIN) 0.1 % Apply 1 application topically 3 (three) times daily.   ibuprofen (ADVIL) 200 MG tablet Take 400 mg by mouth every 6 (six) hours as needed for mild pain.   levETIRAcetam (KEPPRA) 500 MG tablet Take 1 tablet (500 mg total) by mouth 2 (two) times daily for 6 days.   losartan (COZAAR) 100 MG  tablet TAKE 1 TABLET BY MOUTH EVERY DAY   metFORMIN (GLUCOPHAGE) 1000 MG tablet Take 1 tablet (1,000 mg total) by mouth 2 (two) times daily with a meal.   metoprolol succinate (TOPROL-XL) 50 MG 24 hr tablet Take 1 tablet (50 mg total) by mouth daily. Take with or immediately following a meal.   rosuvastatin (CRESTOR) 40 MG tablet Take 1 tablet (40 mg total) by mouth daily.   sildenafil (REVATIO) 20 MG tablet TAKE 1 TO 5 TABLETS BY MOUTH DAILY AS NEEDED (Patient taking differently: Take 20-100 mg by mouth daily as needed (erectile dysfunction).)   [DISCONTINUED] pregabalin (LYRICA) 100 MG capsule Take 1 capsule (100 mg total) by mouth 3 (three) times daily. 2 capsule TID   No facility-administered medications prior to visit.    Review of Systems  Constitutional:  Negative for fatigue and fever.  HENT:  Positive for ear pain.   Respiratory:  Negative for cough and shortness of breath.   Cardiovascular:  Negative for chest pain, palpitations and leg swelling.  Neurological:  Negative for dizziness and headaches.      Objective    Blood pressure (!) 140/82, pulse 67, weight 211 lb 4.8 oz (95.8 kg), SpO2 100 %.   Physical Exam Vitals reviewed.  Constitutional:      Appearance: He is not ill-appearing.  HENT:     Head: Normocephalic.     Ears:     Comments: B/l  TM with erythema, crusting difficult to tell if exudate or cerumen. Unable to fully visualize b/l TM 2/2 cerumen Eyes:     Conjunctiva/sclera: Conjunctivae normal.  Cardiovascular:     Rate and Rhythm: Normal rate.  Pulmonary:     Effort: Pulmonary effort is normal. No respiratory distress.  Neurological:     General: No focal deficit present.     Mental Status: He is alert and oriented to person, place, and time.  Psychiatric:        Mood and Affect: Mood normal.        Behavior: Behavior normal.      No results found for any visits on 05/27/22.  Assessment & Plan     Bl otitis externa Rx ciprodex use 4 drops bid  x 7 days. Can use prn in future for recurrent itching symptoms If tinnitus severity does not resolve would ref to ENT for auditory eval  Problem List Items Addressed This Visit       Respiratory   OSA (obstructive sleep apnea)    Historically. Recent home sleep study reported no evidence of OSA. Pt has lost weight since his past study. Pt advised we can repeat at a center, or not use CPAP and monitor sleepiness.  He will contact office if he wishes to repeat at a center        Endocrine   Chronic painful diabetic neuropathy ([place]) - Primary    Refilled for correct sig. Pt takes 2 capsules in AM and 1 capsule in PM      Relevant Medications   pregabalin (LYRICA) 100 MG capsule   Other Visit Diagnoses     Other otitis externa, bilateral       Relevant Medications   ciprofloxacin-dexamethasone (CIPRODEX) OTIC suspension        Return in about 2 months (around 07/27/2022) for DMII, hypertension.      I, Mikey Kirschner, PA-C have reviewed all documentation for this visit. The documentation on  05/27/2022 for the exam, diagnosis, procedures, and orders are all accurate and complete.  Mikey Kirschner, PA-C [facility] [address] [place], [place], [address] Office: [phone] Fax: [place]

## 2022-06-01 ENCOUNTER — Encounter (INDEPENDENT_AMBULATORY_CARE_PROVIDER_SITE_OTHER): Payer: Self-pay

## 2022-07-06 ENCOUNTER — Ambulatory Visit: Payer: Medicare Other | Admitting: Dermatology

## 2022-07-06 VITALS — BP 184/84

## 2022-07-06 DIAGNOSIS — L570 Actinic keratosis: Secondary | ICD-10-CM

## 2022-07-06 DIAGNOSIS — L578 Other skin changes due to chronic exposure to nonionizing radiation: Secondary | ICD-10-CM | POA: Diagnosis not present

## 2022-07-06 DIAGNOSIS — Z85828 Personal history of other malignant neoplasm of skin: Secondary | ICD-10-CM

## 2022-07-06 DIAGNOSIS — Q825 Congenital non-neoplastic nevus: Secondary | ICD-10-CM

## 2022-07-06 DIAGNOSIS — L219 Seborrheic dermatitis, unspecified: Secondary | ICD-10-CM

## 2022-07-06 DIAGNOSIS — L814 Other melanin hyperpigmentation: Secondary | ICD-10-CM

## 2022-07-06 MED ORDER — KETOCONAZOLE 2 % EX CREA: TOPICAL_CREAM | CUTANEOUS | 5 refills | Status: DC

## 2022-07-06 MED ORDER — HYDROCORTISONE 2.5 % EX CREA: TOPICAL_CREAM | CUTANEOUS | 5 refills | Status: DC

## 2022-07-06 NOTE — Patient Instructions (Addendum)
Seborrheic Dermatitis - flaky areas on face  Mix hydrocortisone with ketaconazole 2% twice a day. If improved, decrease to hydrocortisone and ketaconazole mixed once a day. If still clear, decrease to ketaconazole only.  Cryotherapy Aftercare  Wash gently with soap and water everyday.   Apply Vaseline and Band-Aid daily until healed.   Due to recent changes in healthcare laws, you may see results of your pathology and/or laboratory studies on MyChart before the doctors have had a chance to review them. We understand that in some cases there may be results that are confusing or concerning to you. Please understand that not all results are received at the same time and often the doctors may need to interpret multiple results in order to provide you with the best plan of care or course of treatment. Therefore, we ask that you please give [place] 2 business days to thoroughly review all your results before contacting the office for clarification. Should we see a critical lab result, you will be contacted sooner.   If You Need Anything After Your Visit  If you have any questions or concerns for your doctor, please call our main line at [phone] and press option 4 to reach your doctor's medical assistant. If no one answers, please leave a voicemail as directed and we will return your call as soon as possible. Messages left after 4 pm will be answered the following business day.   You may also send [place] a message via [place]. We typically respond to MyChart messages within 1-2 business days.  For prescription refills, please ask your pharmacy to contact our office. Our fax number is [phone].  If you have an urgent issue when the clinic is closed that cannot wait until the next business day, you can page your doctor at the number below.    Please note that while we do our best to be available for urgent issues outside of office hours, we are not available 24/7.   If you have an urgent issue and are  unable to reach [place], you may choose to seek medical care at your doctor's office, retail clinic, urgent care center, or emergency room.  If you have a medical emergency, please immediately call 911 or go to the emergency department.  Pager Numbers  - Dr. Nehemiah Massed: [phone]  - Dr. Laurence Ferrari: [phone]  - Dr. Nicole Kindred: [phone]  In the event of inclement weather, please call our main line at [phone] for an update on the status of any delays or closures.  Dermatology Medication Tips: Please keep the boxes that topical medications come in in order to help keep track of the instructions about where and how to use these. Pharmacies typically print the medication instructions only on the boxes and not directly on the medication tubes.   If your medication is too expensive, please contact our office at [phone] option 4 or send [place] a message through [place].   We are unable to tell what your co-pay for medications will be in advance as this is different depending on your insurance coverage. However, we may be able to find a substitute medication at lower cost or fill out paperwork to get insurance to cover a needed medication.   If a prior authorization is required to get your medication covered by your insurance company, please allow [place] 1-2 business days to complete this process.  Drug prices often vary depending on where the prescription is filled and some pharmacies may offer cheaper prices.  The website www.goodrx.com contains  coupons for medications through different pharmacies. The prices here do not account for what the cost may be with help from insurance (it may be cheaper with your insurance), but the website can give you the price if you did not use any insurance.  - You can print the associated coupon and take it with your prescription to the pharmacy.  - You may also stop by our office during regular business hours and pick up a GoodRx coupon card.  - If you need your  prescription sent electronically to a different pharmacy, notify our office through [facility] or by phone at [phone] option 4.     Si Usted Necesita Algo Despus de Su Visita  Tambin puede enviarnos un mensaje a travs de Pharmacist, community. Por lo general respondemos a los mensajes de MyChart en el transcurso de 1 a 2 das hbiles.  Para renovar recetas, por favor pida a su farmacia que se ponga en contacto con nuestra oficina. Harland Dingwall de fax es [place] [phone].  Si tiene un asunto urgente cuando la clnica est cerrada y que no puede esperar hasta el siguiente da hbil, puede llamar/localizar a su doctor(a) al nmero que aparece a continuacin.   Por favor, tenga en cuenta que aunque hacemos todo lo posible para estar disponibles para asuntos urgentes fuera del horario de [place], no estamos disponibles las 24 horas del da, los 7 das de la [place].   Si tiene un problema urgente y no puede comunicarse con nosotros, puede optar por buscar atencin mdica  en el consultorio de su doctor(a), en una clnica privada, en un centro de atencin urgente o en una sala de emergencias.  Si tiene Engineering geologist, por favor llame inmediatamente al 911 o vaya a la sala de emergencias.  Nmeros de bper  - Dr. Nehemiah Massed: [phone]  - Dra. Moye: [phone]  - Dra. Nicole Kindred: [phone]  En caso de inclemencias del [place], por favor llame a Johnsie Kindred principal al [phone] para una actualizacin sobre el [place] de cualquier retraso o cierre.  Consejos para la medicacin en dermatologa: Por favor, guarde las cajas en las que vienen los medicamentos de uso tpico para ayudarle a seguir las instrucciones sobre dnde y cmo usarlos. Las farmacias generalmente imprimen las instrucciones del medicamento slo en las cajas y no directamente en los tubos del [place].   Si su medicamento es muy caro, por favor, pngase en contacto con Zigmund Daniel llamando al [phone]  y presione la opcin 4 o envenos un mensaje a travs de Pharmacist, community.   No podemos decirle cul ser su copago por los medicamentos por adelantado ya que esto es diferente dependiendo de la cobertura de su seguro. Sin embargo, es posible que podamos encontrar un medicamento sustituto a Electrical engineer un formulario para que el seguro cubra el medicamento que se considera necesario.   Si se requiere una autorizacin previa para que su compaa de seguros [place] su medicamento, por favor permtanos de 1 a 2 das hbiles para completar este proceso.  Los precios de los medicamentos varan con frecuencia dependiendo del Environmental consultant de dnde se surte la receta y alguna farmacias pueden ofrecer precios ms baratos.  El sitio web www.goodrx.com tiene cupones para medicamentos de Airline pilot. Los precios aqu no tienen en cuenta lo que podra costar con la ayuda del seguro (puede ser ms barato con su seguro), pero el sitio web puede darle el precio si no utiliz Research scientist (physical sciences).  - Puede imprimir el cupn  correspondiente y llevarlo con su receta a la farmacia.  - Tambin puede pasar por nuestra oficina durante el horario de atencin regular y Charity fundraiser una tarjeta de cupones de GoodRx.  - Si necesita que su receta se enve electrnicamente a una farmacia diferente, informe a nuestra oficina a travs de MyChart de  o por telfono llamando al [phone] y presione la opcin 4.

## 2022-07-06 NOTE — Progress Notes (Signed)
Follow-Up Visit   Subjective  Jeff [name] is a 61 y.o. male who presents for the following: Follow-up.  Patient presents for 6 month follow-up Aks. He has a few scaly spots on his hands and glabella that he has noticed. He has h/o BCC R temple treated last visit with [facility].  The following portions of the chart were reviewed this encounter and updated as appropriate:       Review of Systems:  No other skin or systemic complaints except as noted in HPI or Assessment and Plan.  Objective  Well appearing patient in no apparent distress; mood and affect are within normal limits.  A focused examination was performed including face, arms, abdomen. Relevant physical exam findings are noted in the Assessment and Plan.  left upper abdomen 4.0 x 2.0 cm speckled brown patch with hypertrichosis   right temple x 3, right forearm x 7, left forearm x 5 (15) Pink keratotic macules/papules.  eyebrows, scalp, beard area Pink patches with greasy scale.     Assessment & Plan  Congenital non-neoplastic nevus left upper abdomen  Benign-appearing.  Stable. Observation.  Call clinic for new or changing moles.  Recommend daily use of broad spectrum spf 30+ sunscreen to sun-exposed areas.    AK (actinic keratosis) (15) right temple x 3, right forearm x 7, left forearm x 5  Actinic keratoses are precancerous spots that appear secondary to cumulative UV radiation exposure/sun exposure over time. They are chronic with expected duration over 1 year. A portion of actinic keratoses will progress to squamous cell carcinoma of the skin. It is not possible to reliably predict which spots will progress to skin cancer and so treatment is recommended to prevent development of skin cancer.  Recommend daily broad spectrum sunscreen SPF 30+ to sun-exposed areas, reapply every 2 hours as needed.  Recommend staying in the shade or wearing long sleeves, sun glasses (UVA+UVB protection) and wide brim hats (4-inch  brim around the entire circumference of the hat). Call for new or changing lesions.  Destruction of lesion - right temple x 3, right forearm x 7, left forearm x 5  Destruction method: cryotherapy   Informed consent: discussed and consent obtained   Lesion destroyed using liquid nitrogen: Yes   Region frozen until ice ball extended beyond lesion: Yes   Outcome: patient tolerated procedure well with no complications   Post-procedure details: wound care instructions given   Additional details:  Prior to procedure, discussed risks of blister formation, small wound, skin dyspigmentation, or rare scar following cryotherapy. Recommend Vaseline ointment to treated areas while healing.   Seborrheic dermatitis eyebrows, scalp, beard area  Chronic and persistent condition with duration or expected duration over one year. Condition is bothersome/symptomatic for patient. Currently flared.   Seborrheic Dermatitis  -  is a chronic persistent rash characterized by pinkness and scaling most commonly of the mid face but also can occur on the scalp (dandruff), ears; mid chest, mid back and groin.  It tends to be exacerbated by stress and cooler weather.  People who have neurologic disease may experience new onset or exacerbation of existing seborrheic dermatitis.  The condition is not curable but treatable and can be controlled.  Start ketoconazole 2% cream Apply to AA QD/BID as directed dsp 60g 5Rf. Start 2.5% hydrocortisone cream Apply to AA QD/BID as directed dsp 30g 6Rf.   ketoconazole (NIZORAL) 2 % cream - eyebrows, scalp, beard area Apply to flaky areas on face once to twice daily as  directed.  hydrocortisone 2.5 % cream - eyebrows, scalp, beard area Apply to flaky areas on face once to twice daily as directed.  History of Basal Cell Carcinoma of the Skin - No evidence of recurrence today R upper temple, [facility] 6/23 - Recommend regular full body skin exams - Recommend daily broad spectrum  sunscreen SPF 30+ to sun-exposed areas, reapply every 2 hours as needed.  - Call if any new or changing lesions are noted between office visits   Actinic Damage - chronic, secondary to cumulative UV radiation exposure/sun exposure over time - diffuse scaly erythematous macules with underlying dyspigmentation - Recommend daily broad spectrum sunscreen SPF 30+ to sun-exposed areas, reapply every 2 hours as needed.  - Recommend staying in the shade or wearing long sleeves, sun glasses (UVA+UVB protection) and wide brim hats (4-inch brim around the entire circumference of the hat). - Call for new or changing lesions.   Lentigines - Scattered tan macules - Due to sun exposure - Benign-appearing, observe - Recommend daily broad spectrum sunscreen SPF 30+ to sun-exposed areas, reapply every 2 hours as needed. - Call for any changes   Return in about 6 months (around 01/05/2023) for UBSE, Hx AKs, Hx BCC.  Documentation: I have reviewed the above documentation for accuracy and completeness, and I agree with the above.  Brendolyn Patty MD

## 2022-07-31 NOTE — Progress Notes (Deleted)
Established patient visit   Patient: Jeff [name]   DOB: 07/31/1961   61 y.o. Male  MRN: [id number] Visit Date: 08/04/2022  Today's healthcare provider: Mikey Kirschner, PA-C   No chief complaint on file.  Subjective    HPI  Diabetes Mellitus Type II, Follow-up  Lab Results  Component Value Date   HGBA1C 6.7 (A) 01/19/2022   HGBA1C 7.3 (H) 09/09/2021   HGBA1C 7.4 (H) 03/25/2021   Wt Readings from Last 3 Encounters:  05/27/22 211 lb 4.8 oz (95.8 kg)  04/22/22 216 lb (98 kg)  04/08/22 210 lb (95.3 kg)   Last seen for diabetes 6 months ago.  Management since then includes no changes. He reports {excellent/good/fair/poor:19665} compliance with treatment. He {is/is not:21021397} having side effects. {document side effects if present:1} Symptoms: {Yes/No:20286} fatigue {Yes/No:20286} foot ulcerations  {Yes/No:20286} appetite changes {Yes/No:20286} nausea  {Yes/No:20286} paresthesia of the feet  {Yes/No:20286} polydipsia  {Yes/No:20286} polyuria {Yes/No:20286} visual disturbances   {Yes/No:20286} vomiting     Home blood sugar records: {diabetes glucometry results:16657}  Episodes of hypoglycemia? {Yes/No:20286} {enter symptoms and frequency of symptoms if yes:1}   Current insulin regiment: {none} Most Recent Eye Exam: 01/23/22  Pertinent Labs: Lab Results  Component Value Date   CHOL 130 03/25/2021   HDL 29 (L) 03/25/2021   LDLCALC 75 03/25/2021   TRIG 148 03/25/2021   CHOLHDL 4.5 03/25/2021   Lab Results  Component Value Date   NA 139 02/09/2022   K 3.8 02/09/2022   CREATININE 0.99 02/09/2022   GFRNONAA >60 02/09/2022   MICROALBUR neg 03/11/2016     ---------------------------------------------------------------------------------------------------   Medications: Outpatient Medications Prior to Visit  Medication Sig   acetaminophen (TYLENOL) 500 MG tablet Take 1 tablet (500 mg total) by mouth every 6 (six) hours as needed for mild pain.    amLODipine (NORVASC) 5 MG tablet Take 1 tablet (5 mg total) by mouth daily.   ARIPiprazole (ABILIFY) 5 MG tablet TAKE 1 TABLET BY MOUTH EVERY DAY   ciprofloxacin-dexamethasone (CIPRODEX) OTIC suspension Place 4 drops into both ears 2 (two) times daily.   empagliflozin (JARDIANCE) 25 MG TABS tablet Take 1 tablet (25 mg total) by mouth daily.   gentamicin cream (GARAMYCIN) 0.1 % Apply 1 application topically 3 (three) times daily.   hydrocortisone 2.5 % cream Apply to flaky areas on face once to twice daily as directed.   ibuprofen (ADVIL) 200 MG tablet Take 400 mg by mouth every 6 (six) hours as needed for mild pain.   ketoconazole (NIZORAL) 2 % cream Apply to flaky areas on face once to twice daily as directed.   levETIRAcetam (KEPPRA) 500 MG tablet Take 1 tablet (500 mg total) by mouth 2 (two) times daily for 6 days.   losartan (COZAAR) 100 MG tablet TAKE 1 TABLET BY MOUTH EVERY DAY   metFORMIN (GLUCOPHAGE) 1000 MG tablet Take 1 tablet (1,000 mg total) by mouth 2 (two) times daily with a meal.   metoprolol succinate (TOPROL-XL) 50 MG 24 hr tablet Take 1 tablet (50 mg total) by mouth daily. Take with or immediately following a meal.   pregabalin (LYRICA) 100 MG capsule Take 2 capsules in the morning and 1 capsule at bedtime   rosuvastatin (CRESTOR) 40 MG tablet Take 1 tablet (40 mg total) by mouth daily.   sildenafil (REVATIO) 20 MG tablet TAKE 1 TO 5 TABLETS BY MOUTH DAILY AS NEEDED (Patient taking differently: Take 20-100 mg by mouth daily as needed (erectile dysfunction).)  No facility-administered medications prior to visit.    Review of Systems  {Labs  Heme  Chem  Endocrine  Serology  Results Review (optional):23779}   Objective    There were no vitals taken for this visit. {Show previous vital signs (optional):23777}  Physical Exam  ***  No results found for any visits on 08/04/22.  Assessment & Plan     ***  No follow-ups on file.      {provider  attestation***:1}   Mikey Kirschner, PA-C  [facility] [phone] (phone) [phone] (fax)  [place]

## 2022-08-04 ENCOUNTER — Ambulatory Visit: Payer: Medicare Other | Admitting: Physician Assistant

## 2022-08-05 ENCOUNTER — Encounter: Payer: Self-pay | Admitting: Family Medicine

## 2022-08-05 ENCOUNTER — Ambulatory Visit (INDEPENDENT_AMBULATORY_CARE_PROVIDER_SITE_OTHER): Payer: Medicare Other | Admitting: Family Medicine

## 2022-08-05 VITALS — BP 145/65 | HR 62 | Temp 97.8°F | Resp 16 | Wt 207.0 lb

## 2022-08-05 DIAGNOSIS — H608X3 Other otitis externa, bilateral: Secondary | ICD-10-CM | POA: Diagnosis not present

## 2022-08-05 DIAGNOSIS — E1142 Type 2 diabetes mellitus with diabetic polyneuropathy: Secondary | ICD-10-CM

## 2022-08-05 DIAGNOSIS — E1140 Type 2 diabetes mellitus with diabetic neuropathy, unspecified: Secondary | ICD-10-CM

## 2022-08-05 DIAGNOSIS — Z87891 Personal history of nicotine dependence: Secondary | ICD-10-CM | POA: Diagnosis not present

## 2022-08-05 DIAGNOSIS — E1121 Type 2 diabetes mellitus with diabetic nephropathy: Secondary | ICD-10-CM | POA: Diagnosis not present

## 2022-08-05 DIAGNOSIS — E1169 Type 2 diabetes mellitus with other specified complication: Secondary | ICD-10-CM

## 2022-08-05 DIAGNOSIS — E785 Hyperlipidemia, unspecified: Secondary | ICD-10-CM | POA: Diagnosis not present

## 2022-08-05 DIAGNOSIS — E1159 Type 2 diabetes mellitus with other circulatory complications: Secondary | ICD-10-CM

## 2022-08-05 DIAGNOSIS — I152 Hypertension secondary to endocrine disorders: Secondary | ICD-10-CM | POA: Insufficient documentation

## 2022-08-05 MED ORDER — EMPAGLIFLOZIN 25 MG PO TABS: 25.0000 mg | ORAL_TABLET | Freq: Every day | ORAL | 3 refills | Status: AC

## 2022-08-05 MED ORDER — CIPROFLOXACIN-DEXAMETHASONE 0.3-0.1 % OT SUSP: 4.0000 [drp] | Freq: Two times a day (BID) | OTIC | 0 refills | Status: AC

## 2022-08-05 MED ORDER — AMLODIPINE BESYLATE 10 MG PO TABS: 10.0000 mg | ORAL_TABLET | Freq: Every day | ORAL | 3 refills | Status: DC

## 2022-08-05 MED ORDER — PREGABALIN 100 MG PO CAPS: 200.0000 mg | ORAL_CAPSULE | Freq: Two times a day (BID) | ORAL | 0 refills | Status: AC

## 2022-08-05 NOTE — Assessment & Plan Note (Signed)
Acute on chronic, in setting of URI Request for additional refills to assist Other symptoms have subsided

## 2022-08-05 NOTE — Progress Notes (Signed)
Established patient visit   Patient: Jeff [name]   DOB: 03/22/1961   62 y.o. Male  MRN: [id number] Visit Date: 08/05/2022  Today's healthcare provider: Gwyneth Sprout, FNP  Introduced to nurse practitioner role and practice setting.  All questions answered.  Discussed provider/patient relationship and expectations.   Chief Complaint  Patient presents with   Follow-Up chronic disease   Subjective    HPI  Diabetes Mellitus Type II, Follow-up  Lab Results  Component Value Date   HGBA1C 6.7 (A) 01/19/2022   HGBA1C 7.3 (H) 09/09/2021   HGBA1C 7.4 (H) 03/25/2021   Wt Readings from Last 3 Encounters:  08/05/22 207 lb (93.9 kg)  05/27/22 211 lb 4.8 oz (95.8 kg)  04/22/22 216 lb (98 kg)   Last seen for diabetes 6 months ago.  Management since then includes none. Reports he run out of Jardiance 25 mg. He reports excellent compliance with treatment.  Symptoms: Yes fatigue No foot ulcerations  No appetite changes No nausea  Yes paresthesia of the feet  No polydipsia  No polyuria No visual disturbances   No vomiting     Home blood sugar records:  under 200's  Episodes of hypoglycemia? No    Most Recent Eye Exam: 05/2022   Pertinent Labs: Lab Results  Component Value Date   CHOL 130 03/25/2021   HDL 29 (L) 03/25/2021   LDLCALC 75 03/25/2021   TRIG 148 03/25/2021   CHOLHDL 4.5 03/25/2021   Lab Results  Component Value Date   NA 139 02/09/2022   K 3.8 02/09/2022   CREATININE 0.99 02/09/2022   GFRNONAA >60 02/09/2022   MICROALBUR neg 03/11/2016     Hypertension: Patient here for follow-up of elevated blood pressure. Patient was started on Hydralazine mg twice daily.   Blood pressure is well controlled at home in the 120's-130/70's-90. Cardiac symptoms fatigue. Patient denies chest pain, chest pressure/discomfort, exertional chest pressure/discomfort, irregular heart beat, lower extremity edema, and near-syncope.    BP Readings from Last 3 Encounters:   08/05/22 (!) 145/65  07/06/22 (!) 184/84  05/27/22 (!) 140/82    Medications: Outpatient Medications Prior to Visit  Medication Sig   acetaminophen (TYLENOL) 500 MG tablet Take 1 tablet (500 mg total) by mouth every 6 (six) hours as needed for mild pain.   ARIPiprazole (ABILIFY) 5 MG tablet TAKE 1 TABLET BY MOUTH EVERY DAY   gentamicin cream (GARAMYCIN) 0.1 % Apply 1 application topically 3 (three) times daily.   hydrocortisone 2.5 % cream Apply to flaky areas on face once to twice daily as directed.   ibuprofen (ADVIL) 200 MG tablet Take 400 mg by mouth every 6 (six) hours as needed for mild pain.   ketoconazole (NIZORAL) 2 % cream Apply to flaky areas on face once to twice daily as directed.   losartan (COZAAR) 100 MG tablet TAKE 1 TABLET BY MOUTH EVERY DAY   metFORMIN (GLUCOPHAGE) 1000 MG tablet Take 1 tablet (1,000 mg total) by mouth 2 (two) times daily with a meal.   metoprolol succinate (TOPROL-XL) 50 MG 24 hr tablet Take 1 tablet (50 mg total) by mouth daily. Take with or immediately following a meal.   rosuvastatin (CRESTOR) 40 MG tablet Take 1 tablet (40 mg total) by mouth daily.   sildenafil (REVATIO) 20 MG tablet TAKE 1 TO 5 TABLETS BY MOUTH DAILY AS NEEDED (Patient taking differently: Take 20-100 mg by mouth daily as needed (erectile dysfunction).)   [DISCONTINUED] amLODipine (NORVASC) 5  MG tablet Take 1 tablet (5 mg total) by mouth daily.   [DISCONTINUED] pregabalin (LYRICA) 100 MG capsule Take 2 capsules in the morning and 1 capsule at bedtime   levETIRAcetam (KEPPRA) 500 MG tablet Take 1 tablet (500 mg total) by mouth 2 (two) times daily for 6 days.   [DISCONTINUED] ciprofloxacin-dexamethasone (CIPRODEX) OTIC suspension Place 4 drops into both ears 2 (two) times daily. (Patient not taking: Reported on 08/05/2022)   [DISCONTINUED] empagliflozin (JARDIANCE) 25 MG TABS tablet Take 1 tablet (25 mg total) by mouth daily. (Patient not taking: Reported on 08/05/2022)   No  facility-administered medications prior to visit.    Review of Systems    Objective    BP (!) 145/65 (BP Location: Right Arm, Patient Position: Sitting, Cuff Size: Large)   Pulse 62   Temp 97.8 F (36.6 C) (Oral)   Resp 16   Wt 207 lb (93.9 kg)   BMI 31.47 kg/m   Physical Exam Vitals and nursing note reviewed.  Constitutional:      General: He is not in acute distress.    Appearance: Normal appearance. He is obese. He is not ill-appearing, toxic-appearing or diaphoretic.  HENT:     Head: Normocephalic and atraumatic.  Eyes:     Pupils: Pupils are equal, round, and reactive to light.  Cardiovascular:     Rate and Rhythm: Normal rate and regular rhythm.     Pulses: Normal pulses.     Heart sounds: Normal heart sounds.  Pulmonary:     Effort: Pulmonary effort is normal.     Breath sounds: Normal breath sounds.  Musculoskeletal:        General: Normal range of motion.     Cervical back: Normal range of motion.     Right lower leg: No edema.     Left lower leg: No edema.  Skin:    General: Skin is warm and dry.     Capillary Refill: Capillary refill takes less than 2 seconds.  Neurological:     General: No focal deficit present.     Mental Status: He is alert and oriented to person, place, and time. Mental status is at baseline.  Psychiatric:        Mood and Affect: Mood normal.        Behavior: Behavior normal.        Thought Content: Thought content normal.        Judgment: Judgment normal.     No results found for any visits on 08/05/22.  Assessment & Plan     Problem List Items Addressed This Visit       Cardiovascular and Mediastinum   Hypertension associated with diabetes ([place])    Chronic, elevated Goal <130/<80 Recommend titration of norvasc from 5 to 10 mg; OK to use home supply by doubling up and plan to send in new Rx at 10 mg Also has been out of jardiance- 25; refill provided Continue losartan 100 mg  Follow up at CPE      Relevant Medications    empagliflozin (JARDIANCE) 25 MG TABS tablet   amLODipine (NORVASC) 10 MG tablet   Other Relevant Orders   CBC   Comprehensive Metabolic Panel (CMET)     Endocrine   Chronic painful diabetic neuropathy (HCC)    Chronic; followed by podiatry [place] S/p toe amputations Wishes to try increase of lyrica from 300 mg total/day to 400 mg/day RTC as needed      Relevant Medications   empagliflozin (  JARDIANCE) 25 MG TABS tablet   pregabalin (LYRICA) 100 MG capsule   Diabetes mellitus, type 2 (HCC)    Chronic, previously stable Goal <7% Continue to recommend balanced, lower carb meals. Smaller meal size, adding snacks. Choosing water as drink of choice and increasing purposeful exercise. Repeat A1c Refill jardiance at 25 mg       Relevant Medications   empagliflozin (JARDIANCE) 25 MG TABS tablet   Other Relevant Orders   Hemoglobin A1c   Urine Microalbumin w/creat. ratio   Hemoglobin A1c   Urine Microalbumin w/creat. ratio   Hyperlipidemia associated with type 2 diabetes mellitus (HCC) - Primary    Chronic, borderline Last LDL at 75 Previously on 40 Crestor mg recommend diet low in saturated fat and regular exercise - 30 min at least 5 times per week       Relevant Medications   empagliflozin (JARDIANCE) 25 MG TABS tablet   amLODipine (NORVASC) 10 MG tablet   Other Relevant Orders   Lipid panel     Nervous and Auditory   Other otitis externa, bilateral    Acute on chronic, in setting of URI Request for additional refills to assist Other symptoms have subsided       Relevant Medications   ciprofloxacin-dexamethasone (CIPRODEX) OTIC suspension     Other   History of tobacco abuse    Recommend low dose CT scan       Relevant Orders   Ambulatory Referral Lung Cancer Screening [facility]   Return in about 3 months (around 11/04/2022) for annual examination.     Vonna Kotyk, FNP, have reviewed all documentation for this visit. The documentation on  08/05/22 for the exam, diagnosis, procedures, and orders are all accurate and complete.  Gwyneth Sprout, [place] [phone] (phone) [phone] (fax)  [place]

## 2022-08-05 NOTE — Assessment & Plan Note (Signed)
Chronic, elevated Goal <130/<80 Recommend titration of norvasc from 5 to 10 mg; OK to use home supply by doubling up and plan to send in new Rx at 10 mg Also has been out of jardiance- 25; refill provided Continue losartan 100 mg  Follow up at CPE

## 2022-08-05 NOTE — Assessment & Plan Note (Signed)
Recommend low dose CT scan

## 2022-08-05 NOTE — Assessment & Plan Note (Signed)
Chronic, borderline Last LDL at 75 Previously on 40 Crestor mg recommend diet low in saturated fat and regular exercise - 30 min at least 5 times per week

## 2022-08-05 NOTE — Assessment & Plan Note (Signed)
Chronic; followed by podiatry Jeff [name] S/p toe amputations Wishes to try increase of lyrica from 300 mg total/day to 400 mg/day RTC as needed

## 2022-08-05 NOTE — Assessment & Plan Note (Signed)
Chronic, previously stable Goal <7% Continue to recommend balanced, lower carb meals. Smaller meal size, adding snacks. Choosing water as drink of choice and increasing purposeful exercise. Repeat A1c Refill jardiance at 25 mg

## 2022-08-06 ENCOUNTER — Other Ambulatory Visit: Payer: Self-pay | Admitting: Family Medicine

## 2022-08-06 DIAGNOSIS — E785 Hyperlipidemia, unspecified: Secondary | ICD-10-CM

## 2022-08-06 LAB — LIPID PANEL
Chol/HDL Ratio: 8.1 ratio — ABNORMAL HIGH (ref 0.0–5.0)
Cholesterol, Total: 162 mg/dL (ref 100–199)
HDL: 20 mg/dL — ABNORMAL LOW (ref >39)
LDL Chol Calc (NIH): 91 mg/dL (ref 0–99)
Triglycerides: 303 mg/dL — ABNORMAL HIGH (ref 0–149)
VLDL Cholesterol Cal: 51 mg/dL — ABNORMAL HIGH (ref 5–40)

## 2022-08-06 LAB — COMPREHENSIVE METABOLIC PANEL
ALT: 38 IU/L (ref 0–44)
AST: 27 IU/L (ref 0–40)
Albumin/Globulin Ratio: 1.3 (ref 1.2–2.2)
Albumin: 4.4 g/dL (ref 3.9–4.9)
Alkaline Phosphatase: 120 IU/L (ref 44–121)
BUN/Creatinine Ratio: 15 (ref 10–24)
BUN: 19 mg/dL (ref 8–27)
Bilirubin Total: 0.5 mg/dL (ref 0.0–1.2)
CO2: 25 mmol/L (ref 20–29)
Calcium: 8.9 mg/dL (ref 8.6–10.2)
Chloride: 103 mmol/L (ref 96–106)
Creatinine, Ser: 1.25 mg/dL (ref 0.76–1.27)
Globulin, Total: 3.4 g/dL (ref 1.5–4.5)
Glucose: 126 mg/dL — ABNORMAL HIGH (ref 70–99)
Potassium: 4.5 mmol/L (ref 3.5–5.2)
Sodium: 143 mmol/L (ref 134–144)
Total Protein: 7.8 g/dL (ref 6.0–8.5)
eGFR: 65 mL/min/{1.73_m2} (ref >59)

## 2022-08-06 LAB — CBC
Hematocrit: 47.1 % (ref 37.5–51.0)
Hemoglobin: 15.6 g/dL (ref 13.0–17.7)
MCH: 31.2 pg (ref 26.6–33.0)
MCHC: 33.1 g/dL (ref 31.5–35.7)
MCV: 94 fL (ref 79–97)
Platelets: 243 10*3/uL (ref 150–450)
RBC: 5 x10E6/uL (ref 4.14–5.80)
RDW: 12.5 % (ref 11.6–15.4)
WBC: 11.4 10*3/uL — ABNORMAL HIGH (ref 3.4–10.8)

## 2022-08-06 LAB — HEMOGLOBIN A1C
Est. average glucose Bld gHb Est-mCnc: 146 mg/dL
Hgb A1c MFr Bld: 6.7 % — ABNORMAL HIGH (ref 4.8–5.6)

## 2022-08-06 LAB — MICROALBUMIN / CREATININE URINE RATIO
Creatinine, Urine: 201.2 mg/dL
Microalb/Creat Ratio: 11 mg/g creat (ref 0–29)
Microalbumin, Urine: 22.5 ug/mL

## 2022-08-06 NOTE — Progress Notes (Signed)
Non fasting labs show elevated fats, low good/HDL cholesterol, and elevated bad/LDL cholesterol. Stroke and heart attack risk is elevated.   A1c remains stable at 6.7%.   Recommend referral to lipid specialist regarding start of additional cholesterol medication to assist in further cardiovascular attacks.

## 2022-08-06 NOTE — Progress Notes (Signed)
Urine micro stable; repeat annually

## 2022-08-18 ENCOUNTER — Ambulatory Visit: Payer: Medicare Other | Admitting: Podiatry

## 2022-08-18 DIAGNOSIS — L97512 Non-pressure chronic ulcer of other part of right foot with fat layer exposed: Secondary | ICD-10-CM | POA: Diagnosis not present

## 2022-08-18 NOTE — Progress Notes (Signed)
   Subjective:  62 y.o. male with PMHx of diabetes mellitus presenting for routine diabetic foot care.  Patient states that he is doing well.  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  However he is doing much better and he presents for routine foot care  Past Medical History:  Diagnosis Date   Actinic keratosis    Basal cell carcinoma 12/23/2021   right upper temple ED&C 01/26/22   Cancer ([place])    SKIN    Depression    Diabetes mellitus without complication ([place])    Type II   Dyspnea    with exertion    Fatigue    History of basal cell carcinoma (BCC) 05/21/2016   right spinal upper back/superficial   Hx of dysplastic nevus 09/26/2013   right malar cheek/mild   Hyperlipidemia    Hypertension    Hypogonadism male    Neuromuscular disorder ([place])    "back nerve stimulator"   Neuropathy    PAD (peripheral artery disease) ([place])    Sleep apnea    CPAP   Tinnitus    Tuberculosis    POSITIVE  TB SKIN TEST 1992.6 MTH [place] .was exposed to someone who had it.    Objective/Physical Exam General: The patient is alert and oriented x3 in no acute distress.  Dermatology:  Mildly hyperkeratotic dystrophic nails noted to the digits bilateral as well as preulcerative callus tissue to the right posterior heel and left plantar first MTP joint.  No open wounds noted  Vascular: VAS [place] ABI W/WO TBI 08/20/2021 Summary:  Bilateral: Bilateral ankle-brachial indexes are within normal range. No  evidence of significant lower extremity arterial disease. Bilateral  toe-brachial indexes are within normal range.      Neurological: Epicritic and protective threshold diminished bilaterally.  Patient does experience numbness with tingling and burning sensation extending up into his legs, thigh, buttocks, and lower back.  Clinically concerning for a lumbar radiculopathy  Musculoskeletal Exam: History of prior toe amputations fourth and fifth digit right foot  Assessment: 1.   Ulcer right great toe secondary to diabetes mellitus; resolved 2. diabetes mellitus w/ peripheral neuropathy 3.  Lumbar radiculopathy 4.  Preulcerative callus lesions bilateral feet   Plan of Care:  1. Patient was evaluated. 2.  Patient continues to apply the gentamicin cream to the toes daily.  Significant improvement.  Continue. 3.  Light debridement of the nails and preulcerative callus lesions was performed as a courtesy to the patient without incident or bleeding 4.  Return to clinic 6 months    Edrick Kins, DPM [facility]  Dr. Edrick Kins, DPM    [address], [place] [address]                Office [phone]  Fax [phone]

## 2022-08-21 ENCOUNTER — Ambulatory Visit: Payer: Medicare Other | Admitting: Podiatry

## 2022-09-15 ENCOUNTER — Other Ambulatory Visit: Payer: Self-pay | Admitting: Family Medicine

## 2022-09-15 DIAGNOSIS — I10 Essential (primary) hypertension: Secondary | ICD-10-CM

## 2022-09-15 DIAGNOSIS — E1142 Type 2 diabetes mellitus with diabetic polyneuropathy: Secondary | ICD-10-CM
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
## 2022-09-29 ENCOUNTER — Encounter: Payer: Self-pay | Admitting: Student in an Organized Health Care Education/Training Program

## 2022-09-29 DIAGNOSIS — E1140 Type 2 diabetes mellitus with diabetic neuropathy, unspecified: Secondary | ICD-10-CM

## 2022-10-05 ENCOUNTER — Ambulatory Visit
Payer: Medicare Other | Attending: Student in an Organized Health Care Education/Training Program | Admitting: Student in an Organized Health Care Education/Training Program

## 2022-10-05 ENCOUNTER — Encounter: Payer: Self-pay | Admitting: Student in an Organized Health Care Education/Training Program

## 2022-10-05 VITALS — BP 142/85 | HR 62 | Temp 98.2°F | Ht 68.0 in | Wt 216.0 lb

## 2022-10-05 DIAGNOSIS — G894 Chronic pain syndrome: Secondary | ICD-10-CM | POA: Diagnosis not present

## 2022-10-05 DIAGNOSIS — E1142 Type 2 diabetes mellitus with diabetic polyneuropathy: Secondary | ICD-10-CM | POA: Insufficient documentation

## 2022-10-05 DIAGNOSIS — E1140 Type 2 diabetes mellitus with diabetic neuropathy, unspecified: Secondary | ICD-10-CM | POA: Diagnosis not present

## 2022-10-05 MED ORDER — CAPSAICIN-CLEANSING GEL 8 % EX KIT
4.0000 | PACK | Freq: Once | CUTANEOUS | Status: AC
  Administered 2022-10-05: 4 via TOPICAL

## 2022-10-05 NOTE — Progress Notes (Signed)
PROVIDER NOTE: Interpretation of information contained herein should be left to medically-trained personnel. Specific patient instructions are provided elsewhere under "Patient Instructions" section of medical record. This document was created in part using STT-dictation technology, any transcriptional errors that may result from this process are unintentional.  Patient: Jeff [name] Type: Established DOB: [birth year] MRN: [id number] PCP: Mikey Kirschner, PA-C  Service: Procedure DOS: 10/05/2022 Setting: Ambulatory Location: Ambulatory outpatient facility Delivery: Face-to-face Provider: Gillis Santa, MD Specialty: Interventional Pain Management Specialty designation: 09 Location: Outpatient facility Ref. Prov.: Mikey Kirschner, PA-C    Primary Reason for Visit: Interventional Pain Management Treatment. CC: Foot Pain (bilatreral)  Interventional Treatment:          Procedure: Qutenza Neurolysis #2  Laterality:  Bilateral Area treated: Feet Imaging Guidance: None Anesthesia/analgesia/anxiolysis/sedation: None required Medication (Right): Qutenza patches Medication (Left): Qutenza patches Date: 10/05/2022 Performed by: Gillis Santa, MD  Chronic painful diabetic neuropathy     Position / Prep / Materials:  Position: Supine  Materials: Qutenza Kit Pre-op H&P Assessment:  Jeff [name] is a 62 y.o. (year old), male patient, seen today for interventional treatment. He  has a past surgical history that includes Cervical fusion (2012); Carpal tunnel release (Bilateral); Knee arthroscopy (Left); Lumbar laminectomy/decompression microdiscectomy (06/22/2011); left heart catheterization with coronary angiogram (N/A, 01/31/2013); Vasectomy (1991); Back surgery (2012); Cardiac catheterization (01/31/2013); Coronary angioplasty (2014); Cataract extraction w/PHACO (Left, 03/12/2016); Cataract extraction (Right, 2014); ulnar (N/A); Pain stimulator; Amputation toe (Right, 03/04/2018); IR ANGIO INTRA EXTRACRAN  SEL INTERNAL CAROTID BILAT MOD SED (02/01/2022); and IR ANGIO VERTEBRAL SEL VERTEBRAL UNI R MOD SED (02/01/2022). Jeff [name] has a current medication list which includes the following prescription(s): acetaminophen, aripiprazole, aspirin ec, ciprofloxacin-dexamethasone, empagliflozin, gentamicin cream, hydralazine, hydrocortisone, ibuprofen, ketoconazole, losartan, metformin, metoprolol succinate, mometasone, pregabalin, rosuvastatin, sildenafil, and levetiracetam. His primarily concern today is the Foot Pain (bilatreral)  Initial Vital Signs:  Pulse/HCG Rate: 62  Temp: 98.2 F (36.8 C) Resp:   BP: (!) 142/85 SpO2: 96 %  BMI: Estimated body mass index is 32.84 kg/m as calculated from the following:   Height as of this encounter: '5\' 8"'$  (1.727 m).   Weight as of this encounter: 216 lb (98 kg).  Risk Assessment: Allergies: Reviewed. He is allergic to topamax [topiramate], keppra [levetiracetam], neurontin [gabapentin], and cymbalta [duloxetine hcl].  Allergy Precautions: None required Coagulopathies: Reviewed. None identified.  Blood-thinner therapy: None at this time Active Infection(s): Reviewed. None identified. Jeff [name] is afebrile  Site Confirmation: Jeff [name] was asked to confirm the procedure and laterality before marking the site Procedure checklist: Completed Consent: Before the procedure and under the influence of no sedative(s), amnesic(s), or anxiolytics, the patient was informed of the treatment options, risks and possible complications. To fulfill our ethical and legal obligations, as recommended by the American Medical Association's Code of Ethics, I have informed the patient of my clinical impression; the nature and purpose of the treatment or procedure; the risks, benefits, and possible complications of the intervention; the alternatives, including doing nothing; the risk(s) and benefit(s) of the alternative treatment(s) or procedure(s); and the risk(s) and benefit(s) of doing  nothing. The patient was provided information about the general risks and possible complications associated with the procedure. These may include, but are not limited to: failure to achieve desired goals, infection, bleeding, organ or nerve damage, allergic reactions, paralysis, and death. In addition, the patient was informed of those risks and complications associated to the procedure, such as failure to decrease pain; infection; bleeding; organ or nerve  damage with subsequent damage to sensory, motor, and/or autonomic systems, resulting in permanent pain, numbness, and/or weakness of one or several areas of the body; allergic reactions; (i.e.: anaphylactic reaction); and/or death. Furthermore, the patient was informed of those risks and complications associated with the medications. These include, but are not limited to: allergic reactions (i.e.: anaphylactic or anaphylactoid reaction(s)); adrenal axis suppression; blood sugar elevation that in diabetics may result in ketoacidosis or comma; water retention that in patients with history of congestive heart failure may result in shortness of breath, pulmonary edema, and decompensation with resultant heart failure; weight gain; swelling or edema; medication-induced neural toxicity; particulate matter embolism and blood vessel occlusion with resultant organ, and/or nervous system infarction; and/or aseptic necrosis of one or more joints. Finally, the patient was informed that Medicine is not an exact science; therefore, there is also the possibility of unforeseen or unpredictable risks and/or possible complications that may result in a catastrophic outcome. The patient indicated having understood very clearly. We have given the patient no guarantees and we have made no promises. Enough time was given to the patient to ask questions, all of which were answered to the patient's satisfaction. Jeff [name] has indicated that he wanted to continue with the  procedure. Attestation: I, the ordering provider, attest that I have discussed with the patient the benefits, risks, side-effects, alternatives, likelihood of achieving goals, and potential problems during recovery for the procedure that I have provided informed consent. Date  Time: 10/05/2022 10:12 AM  Pre-Procedure Preparation:  Monitoring: As per clinic protocol. Respiration, ETCO2, SpO2, BP, heart rate and rhythm monitor placed and checked for adequate function Safety Precautions: Patient was assessed for positional comfort and pressure points before starting the procedure. Time-out: I initiated and conducted the "Time-out" before starting the procedure, as per protocol. The patient was asked to participate by confirming the accuracy of the "Time Out" information. Verification of the correct person, site, and procedure were performed and confirmed by me, the nursing staff, and the patient. "Time-out" conducted as per Joint Commission's Universal Protocol (UP.01.01.01). Time: 1059  Description/Narrative of Procedure:          Region: Distal lower extremity Target Area: Sensory peripheral nerves affected by diabetic peripheral neuropathy Site: Feet Approach: Percutaneous  No./Series: Not applicable  Type: Percutaneous  Purpose: Therapeutic  Region: Distal lower extremities  Description of the Procedure: Protocol guidelines were followed. The patient was assisted into a comfortable position.  Informed consent was obtained in the patient monitored in the usual manner.  All questions were answered prior to the procedure.  They Qutenza patches were applied to the affected area and then covered with the wrap.  The Patient was kept under observation until the treatment was completed.  The patches were removed and the treated area was inspected.  Vitals:   10/05/22 1015  BP: (!) 142/85  Pulse: 62  Temp: 98.2 F (36.8 C)  TempSrc: Temporal  SpO2: 96%  Weight: 216 lb (98 kg)  Height: '5\' 8"'$   (1.727 m)     Start Time: 1059 hrs. End Time:   hrs.  Post-operative Assessment:  Post-procedure Vital Signs:  Pulse/HCG Rate: 62  Temp:  98.2 F (36.8 C) Resp:   BP:  (!) 142/85 SpO2: 96 %  EBL: None  Complications: No immediate post-treatment complications observed by team, or reported by patient.  Note: The patient tolerated the entire procedure well. A repeat set of vitals were taken after the procedure and the patient was kept under  observation following institutional policy, for this type of procedure. Post-procedural neurological assessment was performed, showing return to baseline, prior to discharge. The patient was provided with post-procedure discharge instructions, including a section on how to identify potential problems. Should any problems arise concerning this procedure, the patient was given instructions to immediately contact us, at any time, without hesitation. In any case, we plan to contact the patient by telephone for a follow-up status report regarding this interventional procedure.  Comments:  No additional relevant information.  Plan of Care  Orders:  No orders of the defined types were placed in this encounter.    Medications ordered for procedure: Meds ordered this encounter  Medications   capsaicin topical system 8 % patch 4 patch   Medications administered: We administered capsaicin topical system.  See the medical record for exact dosing, route, and time of administration.  Follow-up plan:   Return in about 6 weeks (around 11/16/2022) for virtual PPE.     Recent Visits No visits were found meeting these conditions. Showing recent visits within past 90 days and meeting all other requirements Today's Visits Date Type Provider Dept  10/05/22 Procedure visit Gillis Santa, MD [facility]  Showing today's visits and meeting all other requirements Future Appointments Date Type Provider Dept  11/16/22 Appointment Gillis Santa, MD  [facility]  Showing future appointments within next 90 days and meeting all other requirements  Disposition: Discharge home  Discharge (Date  Time): 10/05/2022; 1146 hrs.   Primary Care Physician: Mikey Kirschner, PA-C Location: [facility] Note by: Gillis Santa, MD Date: 10/05/2022; Time: 12:03 PM  Disclaimer:  Medicine is not an exact science. The only guarantee in medicine is that nothing is guaranteed. It is important to note that the decision to proceed with this intervention was based on the information collected from the patient. The Data and conclusions were drawn from the patient's questionnaire, the interview, and the physical examination. Because the information was provided in large part by the patient, it cannot be guaranteed that it has not been purposely or unconsciously manipulated. Every effort has been made to obtain as much relevant data as possible for this evaluation. It is important to note that the conclusions that lead to this procedure are derived in large part from the available data. Always take into account that the treatment will also be dependent on availability of resources and existing treatment guidelines, considered by other Pain Management Practitioners as being common knowledge and practice, at the time of the intervention. For Medico-Legal purposes, it is also important to point out that variation in procedural techniques and pharmacological choices are the acceptable norm. The indications, contraindications, technique, and results of the above procedure should only be interpreted and judged by a Board-Certified Interventional Pain Specialist with extensive familiarity and expertise in the same exact procedure and technique.

## 2022-10-05 NOTE — Progress Notes (Signed)
Safety precautions to be maintained throughout the outpatient stay will include: orient to surroundings, keep bed in low position, maintain call bell within reach at all times, provide assistance with transfer out of bed and ambulation.  

## 2022-10-06 NOTE — Progress Notes (Signed)
Called pp, spoke with daughter and she states that he complained of burning. Instructed to call if needed.

## 2022-10-10 ENCOUNTER — Other Ambulatory Visit: Payer: Self-pay | Admitting: Physician Assistant

## 2022-10-10 DIAGNOSIS — E1142 Type 2 diabetes mellitus with diabetic polyneuropathy: Secondary | ICD-10-CM

## 2022-10-10 DIAGNOSIS — I10 Essential (primary) hypertension: Secondary | ICD-10-CM
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
## 2022-10-14 ENCOUNTER — Other Ambulatory Visit: Payer: Self-pay | Admitting: Physician Assistant

## 2022-10-14 DIAGNOSIS — E1140 Type 2 diabetes mellitus with diabetic neuropathy, unspecified: Secondary | ICD-10-CM
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
## 2022-10-29 ENCOUNTER — Telehealth: Payer: Self-pay

## 2022-10-29 NOTE — Telephone Encounter (Signed)
Copied from [place] [phone]. Topic: Appointment Scheduling - Scheduling Inquiry for Clinic >> Oct 29, 2022 10:30 AM [name] wrote: Reason for CRM: Patient called in wants to cancel April 2 appt. He is with Dr Rosanna Randy now

## 2022-10-29 NOTE — Telephone Encounter (Signed)
Appointment has been cancelled.

## 2022-10-30 ENCOUNTER — Other Ambulatory Visit: Payer: Self-pay | Admitting: Physician Assistant

## 2022-10-30 DIAGNOSIS — E1142 Type 2 diabetes mellitus with diabetic polyneuropathy: Secondary | ICD-10-CM

## 2022-10-30 DIAGNOSIS — E1140 Type 2 diabetes mellitus with diabetic neuropathy, unspecified: Secondary | ICD-10-CM

## 2022-10-30 DIAGNOSIS — I10 Essential (primary) hypertension: Secondary | ICD-10-CM

## 2022-11-03 ENCOUNTER — Ambulatory Visit: Payer: Medicare Other | Admitting: Physician Assistant

## 2022-11-04 ENCOUNTER — Telehealth: Payer: Self-pay | Admitting: Family Medicine

## 2022-11-04 NOTE — Telephone Encounter (Signed)
Contacted Jeff [name] to schedule their annual wellness visit. Patient declined to schedule AWV at this time. Transferred care to [place] new office.   [place] Direct Dial: [phone]

## 2022-11-16 ENCOUNTER — Ambulatory Visit
Payer: Medicare Other | Attending: Student in an Organized Health Care Education/Training Program | Admitting: Student in an Organized Health Care Education/Training Program

## 2022-11-16 DIAGNOSIS — Z9689 Presence of other specified functional implants: Secondary | ICD-10-CM | POA: Diagnosis not present

## 2022-11-16 DIAGNOSIS — E1140 Type 2 diabetes mellitus with diabetic neuropathy, unspecified: Secondary | ICD-10-CM

## 2022-11-16 DIAGNOSIS — G894 Chronic pain syndrome: Secondary | ICD-10-CM

## 2022-11-16 DIAGNOSIS — E1142 Type 2 diabetes mellitus with diabetic polyneuropathy: Secondary | ICD-10-CM

## 2022-11-16 NOTE — Progress Notes (Signed)
Patient: Jeff [name]  Service Category: E/M  Provider: Edward Jolly, MD  DOB: [birth year]  DOS: 11/16/2022  Location: Office  MRN: [id number]  Setting: Ambulatory outpatient  Referring Provider: Alfredia Ferguson, PA-C  Type: Established Patient  Specialty: Interventional Pain Management  PCP: Bosie Clos, MD  Location: Remote location  Delivery: TeleHealth     Virtual Encounter - Pain Management PROVIDER NOTE: Information contained herein reflects review and annotations entered in association with encounter. Interpretation of such information and data should be left to medically-trained personnel. Information provided to patient can be located elsewhere in the medical record under "Patient Instructions". Document created using STT-dictation technology, any transcriptional errors that may result from process are unintentional.    Contact & Pharmacy Preferred: [phone] Home: [phone] (home) Mobile: [phone] (mobile) E-mail: [email or web address]  [id number] - [place], [place] - [address] [place] [address] Phone: [phone] Fax: [phone]   Pre-screening  Jeff [name] offered "in-person" vs "virtual" encounter. He indicated preferring virtual for this encounter.   Reason COVID-19*  Social distancing based on CDC and AMA recommendations.   I contacted Jeff [name] on 11/16/2022 via telephone.      I clearly identified myself [name] Edward Jolly, MD. I verified that I was speaking with the correct person using two identifiers (Name: Jeff [name], and date of birth: 1960-11-02).  Consent I sought verbal advanced consent from Jeff [name] for virtual visit interactions. I informed Jeff [name] of possible security and privacy concerns, risks, and limitations associated with providing "not-in-person" medical evaluation and management services. I also informed Jeff [name] of the availability of "in-person" appointments. Finally, I informed him  that there would be a charge for the virtual visit and that he could be  personally, fully or partially, financially responsible for it. Jeff [name] expressed understanding and agreed to proceed.   Historic Elements   Jeff [name] is a 62 y.o. year old, male patient evaluated today after our last contact on 10/05/2022. Jeff [name]  has a past medical history of Actinic keratosis, Basal cell carcinoma (12/23/2021), Cancer (HCC), Depression, Diabetes mellitus without complication (HCC), Dyspnea, Fatigue, History of basal cell carcinoma (BCC) (05/21/2016), dysplastic nevus (09/26/2013), Hyperlipidemia, Hypertension, Hypogonadism male, Neuromuscular disorder (HCC), Neuropathy, PAD (peripheral artery disease) (HCC), Sleep apnea, Tinnitus, and Tuberculosis. He also  has a past surgical history that includes Cervical fusion (2012); Carpal tunnel release (Bilateral); Knee arthroscopy (Left); Lumbar laminectomy/decompression microdiscectomy (06/22/2011); left heart catheterization with coronary angiogram (N/A, 01/31/2013); Vasectomy (1991); Back surgery (2012); Cardiac catheterization (01/31/2013); Coronary angioplasty (2014); Cataract extraction w/PHACO (Left, 03/12/2016); Cataract extraction (Right, 2014); ulnar (N/A); Pain stimulator; Amputation toe (Right, 03/04/2018); IR ANGIO INTRA EXTRACRAN SEL INTERNAL CAROTID BILAT MOD SED (02/01/2022); and IR ANGIO VERTEBRAL SEL VERTEBRAL UNI R MOD SED (02/01/2022). Mr. Paras has a current medication list which includes the following prescription(s): acetaminophen, aripiprazole, aspirin ec, ciprofloxacin-dexamethasone, empagliflozin, gentamicin cream, hydralazine, hydrocortisone, ibuprofen, ketoconazole, losartan, metformin, metoprolol succinate, mometasone, pregabalin, rosuvastatin, sildenafil, and levetiracetam. He  reports that he quit smoking about 14 years ago. His smoking use included cigarettes. He has a 45.00 pack-year smoking history. He quit smokeless tobacco use about 26  years ago. He reports that he does not drink alcohol and does not use drugs. Jeff [name] is allergic to topamax [topiramate], keppra [levetiracetam], neurontin [gabapentin], and cymbalta [duloxetine hcl].  BMI: Estimated body mass index is 32.84 kg/m [name] calculated from the following:   Height [name] of 10/05/22:  (  1.727 m).   Weight [name] of 10/05/22: 216 lb (98 kg). Last encounter: 05/21/2022. Last procedure: 10/05/2022.  HPI  Today, he is being contacted for a post-procedure assessment.   Post-procedure evaluation   Qutenza Neurolysis #2  Laterality:  Bilateral Area treated: Feet Imaging Guidance: None Anesthesia/analgesia/anxiolysis/sedation: None required Medication (Right): Qutenza patches Medication (Left): Qutenza patches Date: 10/05/2022 Performed by: Edward Jolly, MD  Chronic painful diabetic neuropathy      Effectiveness:  Initial hour after procedure: 0 %  Subsequent 4-6 hours post-procedure: 0 %  Analgesia past initial 6 hours: 0 %  Ongoing improvement:  Analgesic:  0% Function: No benefit ROM: No benefit   Laboratory Chemistry Profile   Renal Lab Results  Component Value Date   BUN 19 08/05/2022   CREATININE 1.25 08/05/2022   BCR 15 08/05/2022   GFRAA 71 03/21/2020   GFRNONAA >60 02/09/2022    Hepatic Lab Results  Component Value Date   AST 27 08/05/2022   ALT 38 08/05/2022   ALBUMIN 4.4 08/05/2022   ALKPHOS 120 08/05/2022    Electrolytes Lab Results  Component Value Date   NA 143 08/05/2022   K 4.5 08/05/2022   CL 103 08/05/2022   CALCIUM 8.9 08/05/2022   MG 1.9 02/09/2022   PHOS 2.7 (L) 01/19/2022    Bone Lab Results  Component Value Date   TESTOSTERONE 270 12/08/2021    Inflammation (CRP: Acute Phase) (ESR: Chronic Phase) No results found for: "CRP", "ESRSEDRATE", "LATICACIDVEN"       Note: Above Lab results reviewed.  Assessment  There were no encounter diagnoses.  Plan of Care  No benefit with Qutenza #2 Patient has a cervical  and lumbar spinal cord stimulator in place.  I encouraged him to reach out to his device company to see if they could optimize/update his lumbar spinal cord stimulator programming that could potentially improve with his diabetic neuropathic pain.  Follow-up plan:   No follow-ups on file.      B/L C4,5,6,7 MBNB #1 & Qutenza 03/11/22, 10/05/22     Recent Visits Date Type Provider Dept  10/05/22 Procedure visit Edward Jolly, MD [facility]  Showing recent visits within past 90 days and meeting all other requirements Today's Visits Date Type Provider Dept  11/16/22 Office Visit Edward Jolly, MD [facility]  Showing today's visits and meeting all other requirements Future Appointments No visits were found meeting these conditions. Showing future appointments within next 90 days and meeting all other requirements  I discussed the assessment and treatment plan with the patient. The patient was provided an opportunity to ask questions and all were answered. The patient agreed with the plan and demonstrated an understanding of the instructions.  Patient advised to call back or seek an in-person evaluation if the symptoms or condition worsens.  Duration of encounter: .  Note by: Edward Jolly, MD Date: 11/16/2022; Time: 10:59 AM

## 2022-12-27 ENCOUNTER — Other Ambulatory Visit: Payer: Self-pay | Admitting: Physician Assistant

## 2022-12-27 DIAGNOSIS — I10 Essential (primary) hypertension: Secondary | ICD-10-CM

## 2022-12-27 DIAGNOSIS — E1142 Type 2 diabetes mellitus with diabetic polyneuropathy: Secondary | ICD-10-CM

## 2023-01-05 ENCOUNTER — Ambulatory Visit: Payer: Medicare Other | Admitting: Dermatology

## 2023-01-05 VITALS — BP 121/72 | HR 83

## 2023-01-05 DIAGNOSIS — D1801 Hemangioma of skin and subcutaneous tissue: Secondary | ICD-10-CM

## 2023-01-05 DIAGNOSIS — L814 Other melanin hyperpigmentation: Secondary | ICD-10-CM

## 2023-01-05 DIAGNOSIS — L821 Other seborrheic keratosis: Secondary | ICD-10-CM | POA: Diagnosis not present

## 2023-01-05 DIAGNOSIS — L578 Other skin changes due to chronic exposure to nonionizing radiation: Secondary | ICD-10-CM

## 2023-01-05 DIAGNOSIS — Z1283 Encounter for screening for malignant neoplasm of skin: Secondary | ICD-10-CM | POA: Diagnosis not present

## 2023-01-05 DIAGNOSIS — X32XXXA Exposure to sunlight, initial encounter: Secondary | ICD-10-CM | POA: Diagnosis not present

## 2023-01-05 DIAGNOSIS — W908XXA Exposure to other nonionizing radiation, initial encounter: Secondary | ICD-10-CM

## 2023-01-05 DIAGNOSIS — W57XXXS Bitten or stung by nonvenomous insect and other nonvenomous arthropods, sequela: Secondary | ICD-10-CM

## 2023-01-05 DIAGNOSIS — D229 Melanocytic nevi, unspecified: Secondary | ICD-10-CM

## 2023-01-05 DIAGNOSIS — D692 Other nonthrombocytopenic purpura: Secondary | ICD-10-CM

## 2023-01-05 DIAGNOSIS — S20361S Insect bite (nonvenomous) of right front wall of thorax, sequela: Secondary | ICD-10-CM | POA: Diagnosis not present

## 2023-01-05 DIAGNOSIS — R222 Localized swelling, mass and lump, trunk: Secondary | ICD-10-CM

## 2023-01-05 DIAGNOSIS — L570 Actinic keratosis: Secondary | ICD-10-CM | POA: Diagnosis not present

## 2023-01-05 DIAGNOSIS — Z85828 Personal history of other malignant neoplasm of skin: Secondary | ICD-10-CM

## 2023-01-05 NOTE — Progress Notes (Signed)
Follow-Up Visit   Subjective  Jeff [name] is a 62 y.o. male who presents for the following: Skin Cancer Screening and Upper Body Skin Exam  The patient presents for Upper Body Skin Exam (UBSE) for skin cancer screening and mole check. The patient has spots, moles and lesions to be evaluated, some may be new or changing and the patient has concerns that these could be cancer. He has some persistent scaly spots on hands and nose.    The following portions of the chart were reviewed this encounter and updated as appropriate: medications, allergies, medical history  Review of Systems:  No other skin or systemic complaints except as noted in HPI or Assessment and Plan.  Objective  Well appearing patient in no apparent distress; mood and affect are within normal limits.  All skin waist up examined. Relevant physical exam findings are noted in the Assessment and Plan.  L nasal root x 1, R hand 4th mcp x 1, R hand 5th mcp x 1, L hand dorsum x 1, R ear antihelix x 1 (5) Keratotic papules    Assessment & Plan   Hypertrophic actinic keratosis (5) L nasal root x 1, R hand 4th mcp x 1, R hand 5th mcp x 1, L hand dorsum x 1, R ear antihelix x 1  Vs ISK  Destruction of lesion - L nasal root x 1, R hand 4th mcp x 1, R hand 5th mcp x 1, L hand dorsum x 1, R ear antihelix x 1  Destruction method: cryotherapy   Informed consent: discussed and consent obtained   Lesion destroyed using liquid nitrogen: Yes   Region frozen until ice ball extended beyond lesion: Yes   Outcome: patient tolerated procedure well with no complications   Post-procedure details: wound care instructions given   Additional details:  Prior to procedure, discussed risks of blister formation, small wound, skin dyspigmentation, or rare scar following cryotherapy. Recommend Vaseline ointment to treated areas while healing.    Lentigines, Seborrheic Keratoses, Hemangiomas - Benign normal skin lesions - Benign-appearing -  Call for any changes  Melanocytic Nevi - Tan-brown and/or pink-flesh-colored symmetric macules and papules - Benign appearing on exam today - Observation - Call clinic for new or changing moles - Recommend daily use of broad spectrum spf 30+ sunscreen to sun-exposed areas.   Actinic Damage - Chronic condition, secondary to cumulative UV/sun exposure - diffuse scaly erythematous macules with underlying dyspigmentation - Recommend daily broad spectrum sunscreen SPF 30+ to sun-exposed areas, reapply every 2 hours as needed.  - Staying in the shade or wearing long sleeves, sun glasses (UVA+UVB protection) and wide brim hats (4-inch brim around the entire circumference of the hat) are also recommended for sun protection.  - Call for new or changing lesions.  Skin cancer screening performed today.  Purpura - Chronic; persistent and recurrent.  Treatable, but not curable. - Violaceous macules and patches - Benign - Related to trauma, age, sun damage and/or use of blood thinners, chronic use of topical and/or oral steroids - Observe - Can use OTC arnica containing moisturizer such as Dermend Bruise Formula if desired - Call for worsening or other concerns  HISTORY OF BASAL CELL CARCINOMA OF THE SKIN - No evidence of recurrence today - Recommend regular full body skin exams - Recommend daily broad spectrum sunscreen SPF 30+ to sun-exposed areas, reapply every 2 hours as needed.  - Call if any new or changing lesions are noted between office visits  -  R upper temple, R spinal upper back  BITE REACTION Hx of tick bite Exam: pink edematous pap with central crusting R chest  Treatment Plan: Benign. 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 Recommend observation. Call clinic for new or changing moles. Recommend daily use of broad spectrum spf 30+ sunscreen to sun-exposed areas.     Return in about 6 months (around 07/07/2023) for [place] f/u.  Maylene Roes, CMA, am acting as scribe for Willeen Niece, MD .  Documentation: I have reviewed the above documentation for accuracy and completeness, and I agree with the above.  Willeen Niece, MD

## 2023-01-05 NOTE — Patient Instructions (Signed)
Cryotherapy Aftercare  Wash gently with soap and water everyday.   Apply Vaseline and Band-Aid daily until healed.     Due to recent changes in healthcare laws, you may see results of your pathology and/or laboratory studies on MyChart before the doctors have had a chance to review them. We understand that in some cases there may be results that are confusing or concerning to you. Please understand that not all results are received at the same time and often the doctors may need to interpret multiple results in order to provide you with the best plan of care or course of treatment. Therefore, we ask that you please give us 2 business days to thoroughly review all your results before contacting the office for clarification. Should we see a critical lab result, you will be contacted sooner.   If You Need Anything After Your Visit  If you have any questions or concerns for your doctor, please call our main line at [phone] and press option 4 to reach your doctor's medical assistant. If no one answers, please leave a voicemail as directed and we will return your call as soon as possible. Messages left after 4 pm will be answered the following business day.   You may also send us a message via MyChart. We typically respond to MyChart messages within 1-2 business days.  For prescription refills, please ask your pharmacy to contact our office. Our fax number is [phone].  If you have an urgent issue when the clinic is closed that cannot wait until the next business day, you can page your doctor at the number below.    Please note that while we do our best to be available for urgent issues outside of office hours, we are not available 24/7.   If you have an urgent issue and are unable to reach us, you may choose to seek medical care at your doctor's office, retail clinic, urgent care center, or emergency room.  If you have a medical emergency, please immediately call 911 or go to the  emergency department.  Pager Numbers  - Dr. Kowalski: [phone]  - Dr. Moye: [phone]  - Dr. Stewart: [phone]  In the event of inclement weather, please call our main line at [phone] for an update on the status of any delays or closures.  Dermatology Medication Tips: Please keep the boxes that topical medications come in in order to help keep track of the instructions about where and how to use these. Pharmacies typically print the medication instructions only on the boxes and not directly on the medication tubes.   If your medication is too expensive, please contact our office at [phone] option 4 or send us a message through MyChart.   We are unable to tell what your co-pay for medications will be in advance as this is different depending on your insurance coverage. However, we may be able to find a substitute medication at lower cost or fill out paperwork to get insurance to cover a needed medication.   If a prior authorization is required to get your medication covered by your insurance company, please allow us 1-2 business days to complete this process.  Drug prices often vary depending on where the prescription is filled and some pharmacies may offer cheaper prices.  The website www.goodrx.com contains coupons for medications through different pharmacies. The prices here do not account for what the cost may be with help from insurance (it may be cheaper with your insurance), but the website can   give you the price if you did not use any insurance.  - You can print the associated coupon and take it with your prescription to the pharmacy.  - You may also stop by our office during regular business hours and pick up a GoodRx coupon card.  - If you need your prescription sent electronically to a different pharmacy, notify our office through [facility] MyChart or by phone at [phone] option 4.     Si Usted Necesita Algo Despus de Su Visita  Tambin puede  enviarnos un mensaje a travs de MyChart. Por lo general respondemos a los mensajes de MyChart en el transcurso de 1 a 2 das hbiles.  Para renovar recetas, por favor pida a su farmacia que se ponga en contacto con nuestra oficina. Nuestro nmero de fax es el [phone].  Si tiene un asunto urgente cuando la clnica est cerrada y que no puede esperar hasta el siguiente da hbil, puede llamar/localizar a su doctor(a) al nmero que aparece a continuacin.   Por favor, tenga en cuenta que aunque hacemos todo lo posible para estar disponibles para asuntos urgentes fuera del horario de oficina, no estamos disponibles las 24 horas del da, los 7 das de la semana.   Si tiene un problema urgente y no puede comunicarse con nosotros, puede optar por buscar atencin mdica  en el consultorio de su doctor(a), en una clnica privada, en un centro de atencin urgente o en una sala de emergencias.  Si tiene una emergencia mdica, por favor llame inmediatamente al 911 o vaya a la sala de emergencias.  Nmeros de bper  - Dr. Kowalski: [phone]  - Dra. Moye: [phone]  - Dra. Stewart: [phone]  En caso de inclemencias del tiempo, por favor llame a nuestra lnea principal al [phone] para una actualizacin sobre el estado de cualquier retraso o cierre.  Consejos para la medicacin en dermatologa: Por favor, guarde las cajas en las que vienen los medicamentos de uso tpico para ayudarle a seguir las instrucciones sobre dnde y cmo usarlos. Las farmacias generalmente imprimen las instrucciones del medicamento slo en las cajas y no directamente en los tubos del medicamento.   Si su medicamento es muy caro, por favor, pngase en contacto con nuestra oficina llamando al [phone] y presione la opcin 4 o envenos un mensaje a travs de MyChart.   No podemos decirle cul ser su copago por los medicamentos por adelantado ya que esto es diferente dependiendo de la cobertura de su seguro.  Sin embargo, es posible que podamos encontrar un medicamento sustituto a menor costo o llenar un formulario para que el seguro cubra el medicamento que se considera necesario.   Si se requiere una autorizacin previa para que su compaa de seguros cubra su medicamento, por favor permtanos de 1 a 2 das hbiles para completar este proceso.  Los precios de los medicamentos varan con frecuencia dependiendo del lugar de dnde se surte la receta y alguna farmacias pueden ofrecer precios ms baratos.  El sitio web www.goodrx.com tiene cupones para medicamentos de diferentes farmacias. Los precios aqu no tienen en cuenta lo que podra costar con la ayuda del seguro (puede ser ms barato con su seguro), pero el sitio web puede darle el precio si no utiliz ningn seguro.  - Puede imprimir el cupn correspondiente y llevarlo con su receta a la farmacia.  - Tambin puede pasar por nuestra oficina durante el horario de atencin regular y recoger una tarjeta de cupones de GoodRx.  -   Si necesita que su receta se enve electrnicamente a una farmacia diferente, informe a nuestra oficina a travs de MyChart de [place] o por telfono llamando al [phone] y presione la opcin 4.  

## 2023-01-08 ENCOUNTER — Ambulatory Visit: Payer: Self-pay

## 2023-01-08 NOTE — Patient Outreach (Signed)
  Care Coordination   01/08/2023 Name: Jeff [name] MRN: [id number] DOB: Sep 14, 1960   Care Coordination Outreach Attempts:  An unsuccessful telephone outreach was attempted today to offer the patient information about available care coordination services.  Follow Up Plan:  Additional outreach attempts will be made to offer the patient care coordination information and services.   Encounter Outcome:  No Answer   Care Coordination Interventions:  No, not indicated    SIG Lysle Morales, BSW Social Worker [facility] Care Management  [phone]

## 2023-01-26 ENCOUNTER — Ambulatory Visit: Payer: Medicare Other | Admitting: Dermatology

## 2023-01-28 DIAGNOSIS — E119 Type 2 diabetes mellitus without complications: Secondary | ICD-10-CM | POA: Diagnosis not present

## 2023-02-25 ENCOUNTER — Ambulatory Visit: Payer: Medicare Other | Admitting: Internal Medicine

## 2023-02-25 ENCOUNTER — Encounter: Payer: Self-pay | Admitting: Internal Medicine

## 2023-02-25 VITALS — BP 158/96 | HR 53 | Ht 68.0 in | Wt 219.8 lb

## 2023-02-25 DIAGNOSIS — Z7984 Long term (current) use of oral hypoglycemic drugs: Secondary | ICD-10-CM

## 2023-02-25 DIAGNOSIS — E785 Hyperlipidemia, unspecified: Secondary | ICD-10-CM | POA: Diagnosis not present

## 2023-02-25 DIAGNOSIS — I10 Essential (primary) hypertension: Secondary | ICD-10-CM | POA: Diagnosis not present

## 2023-02-25 DIAGNOSIS — E119 Type 2 diabetes mellitus without complications: Secondary | ICD-10-CM | POA: Diagnosis not present

## 2023-02-25 MED ORDER — AMLODIPINE BESYLATE 5 MG PO TABS: 5.0000 mg | ORAL_TABLET | Freq: Every day | ORAL | 3 refills | Status: AC

## 2023-02-25 NOTE — Patient Instructions (Signed)
Medication Instructions:  Start amlodipine 5 mg  *If you need a refill on your cardiac medications before your next appointment, please call your pharmacy*   Lab Work: No labs If you have labs (blood work) drawn today and your tests are completely normal, you will receive your results only by: MyChart Message (if you have MyChart) OR A paper copy in the mail If you have any lab test that is abnormal or we need to change your treatment, we will call you to review the results.   Testing/Procedures: No Testing   Follow-Up: At [facility], you and your health needs are our priority.  As part of our continuing mission to provide you with exceptional heart care, we have created designated Provider Care Teams.  These Care Teams include your primary Cardiologist (physician) and Advanced Practice Providers (APPs -  Physician Assistants and Nurse Practitioners) who all work together to provide you with the care you need, when you need it.  We recommend signing up for the patient portal called "MyChart".  Sign up information is provided on this After Visit Summary.  MyChart is used to connect with patients for Virtual Visits (Telemedicine).  Patients are able to view lab/test results, encounter notes, upcoming appointments, etc.  Non-urgent messages can be sent to your provider as well.   To learn more about what you can do with MyChart, go to [email or web address].    Your next appointment:   Follow up As Needed  Provider:   None  Chrystie Nose, MD

## 2023-02-25 NOTE — Progress Notes (Signed)
LIPID CLINIC CONSULT NOTE  Chief Complaint:  Manage dyslipidemia  Primary Care Physician: Bosie Clos, MD  Primary Cardiologist:  None  HPI:  Jeff [name] is a 62 y.o. male who is being seen today for the evaluation of dyslipidemia at the request of Jacky Kindle, FNP. This is a pleasant 63 year old male kindly referred for evaluation management of dyslipidemia.  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  Target LDL is less than 70.  Earlier this year LDL was 91 with triglycerides 303.  He says there was an issue getting his medications and he was off of it.  Subsequently was restarted on rosuvastatin 40 mg daily.  He is been compliant with diet and recent repeat check of his lipids this month showed total cholesterol 101, triglycerides 182, HDL 29 and LDL 36.  This indicates very good control.  He is also struggled somewhat with his blood pressure.  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  Ultimately he says he has had several surgeries and has spinal stimulators.  With regards to neuropathy he has had lower extremity swelling which seem to be a little worse on the left than the right.  He is also had hypertension and was on amlodipine with increasing doses up to 10 mg daily which I suspect contributed to his edema.  He was taken off of that however his blood pressure is still very high today at 158/96.  Prior to this he was better controlled.  He is also on hydralazine low-dose 25 mg twice daily, losartan 100 mg daily and metoprolol 50 mg daily.  He has no known coronary disease although has been noted to have some aortic atherosclerosis.  He saw Dr. Mariah Milling back in 2014 for syncopal episode.  He had cardiac catheterization at that time which showed minimal coronary artery disease.  Medication adjustments were made and he has not followed up with  cardiology since then.  PMHx:  Past Medical History:  Diagnosis Date   Actinic keratosis    Basal cell carcinoma 12/23/2021   right upper temple ED&C 01/26/22   Cancer (HCC)    SKIN    Depression    Diabetes mellitus without complication (HCC)    Type II   Dyspnea    with exertion    Fatigue    History of basal cell carcinoma (BCC) 05/21/2016   right spinal upper back/superficial   Hx of dysplastic nevus 09/26/2013   right malar cheek/mild   Hyperlipidemia    Hypertension    Hypogonadism male    Neuromuscular disorder (HCC)    "back nerve stimulator"   Neuropathy    PAD (peripheral artery disease) (HCC)    Sleep apnea    CPAP   Tinnitus    Tuberculosis    POSITIVE  TB SKIN TEST 1992.6 [place] .was exposed to someone who had it.    Past Surgical History:  Procedure Laterality Date   AMPUTATION TOE Right 03/04/2018   Procedure: AMPUTATION TOE/MPJ JOINT FIFTH RIGHT;  Surgeon: Felecia Shelling, DPM;  Location: [facility] OR;  Service: Podiatry;  Laterality: Right;   BACK SURGERY  2012   neck was 2012,back same year. plate in [id number]   CARDIAC CATHETERIZATION  01/31/2013   Medical management   CARPAL TUNNEL RELEASE Bilateral    CATARACT EXTRACTION Right 2014   CATARACT  EXTRACTION W/PHACO Left 03/12/2016   Procedure: CATARACT EXTRACTION PHACO AND INTRAOCULAR LENS PLACEMENT (IOC);  Surgeon: Galen Manila, MD;  Location: [facility] ORS;  Service: Ophthalmology;  Laterality: Left;  [place] 00:31AP% 17.9CDE 5.67Fluid pack lot # [id number]   CERVICAL FUSION  2012   CORONARY ANGIOPLASTY  2014   all good   IR ANGIO INTRA EXTRACRAN SEL INTERNAL CAROTID BILAT MOD SED  02/01/2022   IR ANGIO VERTEBRAL SEL VERTEBRAL UNI R MOD SED  02/01/2022   KNEE ARTHROSCOPY Left    LEFT HEART CATHETERIZATION WITH CORONARY ANGIOGRAM N/A 01/31/2013   Procedure: LEFT HEART CATHETERIZATION WITH CORONARY ANGIOGRAM;  Surgeon: Rollene Rotunda, MD;  [facility] CATH LAB;  Service: Cardiovascular;  Laterality: N/A;   LUMBAR  LAMINECTOMY/DECOMPRESSION MICRODISCECTOMY  06/22/2011   Procedure: LUMBAR LAMINECTOMY/DECOMPRESSION MICRODISCECTOMY;  Surgeon: Cristi Loron;  Location: [facility] NEURO ORS;  Service: Neurosurgery;  Laterality: N/A;  Thoracic Ten-Eleven,Thoracic Eleven-Twelve Laminectomy   Pain stimulator     ulnar N/A    VASECTOMY  1991    FAMHx:  Family History  Problem Relation Age of Onset   Coronary artery disease Father        PPM in his late 13s, CAD Dx 53s   Hyperlipidemia Father    Hypertension Father    Heart disease Father        CABG at age 96   Diabetes Father    Atrial fibrillation Mother    Hypertension Mother    Transient ischemic attack Mother    Dementia Mother    Migraines Daughter    Cancer Maternal Uncle        Throat   Cancer Maternal Grandmother        Lung cancer    SOCHx:   reports that he quit smoking about 14 years ago. His smoking use included cigarettes. He started smoking about 44 years ago. He has a 45 pack-year smoking history. He quit smokeless tobacco use about 27 years ago. He reports that he does not drink alcohol and does not use drugs.  ALLERGIES:  Allergies  Allergen Reactions   Topamax [Topiramate] Other (See Comments)    Unresponsive episodes Syncope    Keppra [Levetiracetam] Palpitations and Other (See Comments)    "jittery", and "loopy." per pt.   Neurontin [Gabapentin] Other (See Comments)    Tremors "jittery" and "loopy" per pt.   Cymbalta [Duloxetine Hcl] Other (See Comments)    "jittery" and "loopy" per pt    ROS: Pertinent items noted in HPI and remainder of comprehensive ROS otherwise negative.  HOME MEDS: Current Outpatient Medications on File Prior to Visit  Medication Sig Dispense Refill   acetaminophen (TYLENOL) 500 MG tablet Take 1 tablet (500 mg total) by mouth every 6 (six) hours as needed for mild pain. 30 tablet 0   aspirin EC (BAYER ASPIRIN EC LOW DOSE) 81 MG tablet Take 81 mg by mouth daily.     ciprofloxacin-dexamethasone  (CIPRODEX) OTIC suspension Place 4 drops into both ears 2 (two) times daily. 7.5 mL 0   empagliflozin (JARDIANCE) 25 MG TABS tablet Take 1 tablet (25 mg total) by mouth daily. 90 tablet 3   gentamicin cream (GARAMYCIN) 0.1 % Apply 1 application topically 3 (three) times daily. 15 g 1   hydrALAZINE (APRESOLINE) 25 MG tablet Take 25 mg by mouth 2 (two) times daily.     hydrocortisone 2.5 % cream Apply to flaky areas on face once to twice daily as directed. 30 g 5   ibuprofen (ADVIL)  200 MG tablet Take 400 mg by mouth every 6 (six) hours as needed for mild pain.     losartan (COZAAR) 100 MG tablet TAKE 1 TABLET BY MOUTH EVERY DAY 30 tablet 5   metFORMIN (GLUCOPHAGE) 1000 MG tablet TAKE 1 TABLET (1,000 MG TOTAL) BY MOUTH TWICE A DAY WITH FOOD 60 tablet 1   metoprolol succinate (TOPROL-XL) 50 MG 24 hr tablet TAKE 1 TABLET BY MOUTH DAILY. TAKE WITH OR IMMEDIATELY FOLLOWING A MEAL. 30 tablet 1   mometasone (ELOCON) 0.1 % cream Apply 1 Application topically daily.     pregabalin (LYRICA) 100 MG capsule Take 2 capsules (200 mg total) by mouth 2 (two) times daily. 360 capsule 0   rosuvastatin (CRESTOR) 40 MG tablet Take 1 tablet (40 mg total) by mouth daily. 90 tablet 1   sildenafil (REVATIO) 20 MG tablet TAKE 1 TO 5 TABLETS BY MOUTH DAILY AS NEEDED (Patient taking differently: Take 20-100 mg by mouth daily as needed (erectile dysfunction).) 25 tablet 10   ARIPiprazole (ABILIFY) 5 MG tablet TAKE 1 TABLET BY MOUTH EVERY DAY (Patient not taking: Reported on 02/25/2023) 90 tablet 4   ketoconazole (NIZORAL) 2 % cream Apply to flaky areas on face once to twice daily as directed. (Patient not taking: Reported on 02/25/2023) 60 g 5   levETIRAcetam (KEPPRA) 500 MG tablet Take 1 tablet (500 mg total) by mouth 2 (two) times daily for 6 days. 12 tablet 0   No current facility-administered medications on file prior to visit.    LABS/IMAGING: No results found for this or any previous visit (from the past 48  hour(s)). No results found.  LIPID PANEL:    Component Value Date/Time   CHOL 162 08/05/2022 1354   TRIG 303 (H) 08/05/2022 1354   HDL 20 (L) 08/05/2022 1354   CHOLHDL 8.1 (H) 08/05/2022 1354   CHOLHDL 6.1 (H) 05/05/2017 0800   VLDL 25 01/30/2013 0455   LDLCALC 91 08/05/2022 1354   LDLCALC 69 05/05/2017 0800    WEIGHTS: Wt Readings from Last 3 Encounters:  02/25/23 219 lb 12.8 oz (99.7 kg)  10/05/22 216 lb (98 kg)  08/05/22 207 lb (93.9 kg)    VITALS: BP (!) 158/96 (BP Location: Left Arm, Patient Position: Sitting, Cuff Size: Normal)   Pulse (!) 53   Ht 5\' 8"  (1.727 m)   Wt 219 lb 12.8 oz (99.7 kg)   SpO2 97%   BMI 33.42 kg/m   EXAM: Deferred  EKG: Deferred  ASSESSMENT: Mixed dyslipidemia, goal LDL less than 70 Type 2 diabetes, recent A1c 8.0% Recent subarachnoid hemorrhage secondary to ruptured aneurysm (2023) History of syncope PAD Aortic atherosclerosis Hypertension  PLAN: 1.   [name] has a mixed dyslipidemia however his cholesterol is much improved after reestablishing therapy.  He had some difficulty getting the medication due to his provider switching practices.  His lipids now are much better controlled and at target.  Blood pressure continues however to be uncontrolled.  He was on amlodipine in addition to some other agents with good blood pressure control but had worsening edema.  He has neuropathy which is another risk factor.  He was taken off the amlodipine 10 mg daily however likely does need some component of that.  He does not have a follow-up with his PCP until September.  I would advise going back on the 5 mg amlodipine now and if he needs additional blood pressure lowering his hydralazine could be uptitrated or he could be switched from  losartan to a more potent ARB such as valsartan or telmisartan.  I will leave this to the discretion of his primary care provider who is seen for a number of years.  Plan follow-up with me otherwise as  needed.  Thanks for allowing me to participate in his care.  Chrystie Nose, MD, [facility], FACP  [place]  [facility]  Medical Director of the [facility]  [facility]ovascular [facility] Reduction [facility] Diplomate of the American Board of Clinical Lipidology Attending Cardiologist  Direct Dial: [phone]  Fax: [phone]  Website:  [email or web address]  Jeff [name] Jeff [name] 02/25/2023, 10:26 AM

## 2023-03-16 ENCOUNTER — Ambulatory Visit (INDEPENDENT_AMBULATORY_CARE_PROVIDER_SITE_OTHER): Payer: Medicare Other

## 2023-03-16 ENCOUNTER — Ambulatory Visit: Payer: Medicare Other | Admitting: Podiatry

## 2023-03-16 DIAGNOSIS — L97512 Non-pressure chronic ulcer of other part of right foot with fat layer exposed: Secondary | ICD-10-CM

## 2023-03-16 DIAGNOSIS — R609 Edema, unspecified: Secondary | ICD-10-CM | POA: Diagnosis not present

## 2023-03-16 DIAGNOSIS — Z89421 Acquired absence of other right toe(s): Secondary | ICD-10-CM

## 2023-03-16 DIAGNOSIS — E0843 Diabetes mellitus due to underlying condition with diabetic autonomic (poly)neuropathy: Secondary | ICD-10-CM

## 2023-03-16 NOTE — Progress Notes (Signed)
No chief complaint on file.   Subjective:  62 y.o. male with PMHx of diabetes mellitus with peripheral polyneuropathy presenting for idiopathic swelling of the left lower extremity.  Patient states that by the end of the day he has significant swelling.  Patient states that he does have significant amount of swelling to the left lower extremity and by the end of the day he is unable to wear his boots due to the swelling.  Patient would like to have it evaluated  Past Medical History:  Diagnosis Date   Actinic keratosis    Basal cell carcinoma 12/23/2021   right upper temple ED&C 01/26/22   Cancer (HCC)    SKIN    Depression    Diabetes mellitus without complication (HCC)    Type II   Dyspnea    with exertion    Fatigue    History of basal cell carcinoma (BCC) 05/21/2016   right spinal upper back/superficial   Hx of dysplastic nevus 09/26/2013   right malar cheek/mild   Hyperlipidemia    Hypertension    Hypogonadism male    Neuromuscular disorder (HCC)    "back nerve stimulator"   Neuropathy    PAD (peripheral artery disease) (HCC)    Sleep apnea    CPAP   Tinnitus    Tuberculosis    POSITIVE  TB SKIN TEST 1992.6 [place] .was exposed to someone who had it.   Past Surgical History:  Procedure Laterality Date   AMPUTATION TOE Right 03/04/2018   Procedure: AMPUTATION TOE/MPJ JOINT FIFTH RIGHT;  Surgeon: Felecia Shelling, DPM;  Location: [facility] OR;  Service: Podiatry;  Laterality: Right;   BACK SURGERY  2012   neck was 2012,back same year. plate in [id number]   CARDIAC CATHETERIZATION  01/31/2013   Medical management   CARPAL TUNNEL RELEASE Bilateral    CATARACT EXTRACTION Right 2014   CATARACT EXTRACTION W/PHACO Left 03/12/2016   Procedure: CATARACT EXTRACTION PHACO AND INTRAOCULAR LENS PLACEMENT (IOC);  Surgeon: Galen Manila, MD;  Location: [facility] ORS;  Service: Ophthalmology;  Laterality: Left;  [place] 00:31AP% 17.9CDE 5.67Fluid pack lot # [id number]   CERVICAL FUSION  2012    CORONARY ANGIOPLASTY  2014   all good   IR ANGIO INTRA EXTRACRAN SEL INTERNAL CAROTID BILAT MOD SED  02/01/2022   IR ANGIO VERTEBRAL SEL VERTEBRAL UNI R MOD SED  02/01/2022   KNEE ARTHROSCOPY Left    LEFT HEART CATHETERIZATION WITH CORONARY ANGIOGRAM N/A 01/31/2013   Procedure: LEFT HEART CATHETERIZATION WITH CORONARY ANGIOGRAM;  Surgeon: Rollene Rotunda, MD;  Location: [facility] CATH LAB;  Service: Cardiovascular;  Laterality: N/A;   LUMBAR LAMINECTOMY/DECOMPRESSION MICRODISCECTOMY  06/22/2011   Procedure: LUMBAR LAMINECTOMY/DECOMPRESSION MICRODISCECTOMY;  Surgeon: Cristi Loron;  Location: [facility] NEURO ORS;  Service: Neurosurgery;  Laterality: N/A;  Thoracic Ten-Eleven,Thoracic Eleven-Twelve Laminectomy   Pain stimulator     ulnar N/A    VASECTOMY  1991   Allergies  Allergen Reactions   Topamax [Topiramate] Other (See Comments)    Unresponsive episodes Syncope    Keppra [Levetiracetam] Palpitations and Other (See Comments)    "jittery", and "loopy." per pt.   Neurontin [Gabapentin] Other (See Comments)    Tremors "jittery" and "loopy" per pt.   Cymbalta [Duloxetine Hcl] Other (See Comments)    "jittery" and "loopy" per pt    Objective/Physical Exam General: The patient is alert and oriented x3 in no acute distress.  Dermatology:  Loss of the toenails noted to the bilateral toes with superficial  open wound to the nailbed of the right hallux nail plate.  Vascular: VAS [place] ABI W/WO TBI 08/20/2021 Summary:  Bilateral: Bilateral ankle-brachial indexes are within normal range. No  evidence of significant lower extremity arterial disease. Bilateral  toe-brachial indexes are within normal range.   Chronic edema bilateral lower extremities.  No erythema.  Capillary refill WNL  Neurological: Light touch and protective threshold diminished bilaterally.  Patient does experience numbness with tingling and burning sensation extending up into his legs, thigh, buttocks, and lower back.  Clinically  concerning for a lumbar radiculopathy  Musculoskeletal Exam: History of prior toe amputations fourth and fifth digit right foot  Radiographic exam B/L feet 03/16/2023: Normal osseous mineralization.  Joint spaces mostly preserved.  Loss of the fourth and fifth digit to the right foot at the level of the MTP.  Assessment: 1.  Loss of toenails bilateral w/ superficial ulcer of the nailbed right hallux 2. diabetes mellitus w/ peripheral neuropathy 3.  Lumbar radiculopathy 4.  Preulcerative callus lesions bilateral feet 5.  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  X-rays reviewed -Continue to apply the gentamicin cream to the toes daily.  Significant improvement.  Continue. -Light debridement of the nailbed to the right hallux nail plate was performed today using a tissue nipper.  Light dressing applied -Order placed for venous Doppler to evaluate any reflux that would be causing the swelling to the left lower extremity -Order placed for new diabetic shoes with custom molded Plastizote insoles -Will contact the patient via telephone regarding the venous Doppler results. -Return to clinic 6 months   Felecia Shelling, DPM [facility]  Dr. Felecia Shelling, DPM    [address] [place], [place] [address]                Office [phone]  Fax [phone]

## 2023-04-14 ENCOUNTER — Other Ambulatory Visit: Payer: Self-pay | Admitting: Physician Assistant

## 2023-04-14 DIAGNOSIS — I10 Essential (primary) hypertension: Secondary | ICD-10-CM

## 2023-04-14 DIAGNOSIS — E1142 Type 2 diabetes mellitus with diabetic polyneuropathy: Secondary | ICD-10-CM

## 2023-04-20 DIAGNOSIS — E1159 Type 2 diabetes mellitus with other circulatory complications: Secondary | ICD-10-CM | POA: Diagnosis not present

## 2023-04-20 DIAGNOSIS — Z981 Arthrodesis status: Secondary | ICD-10-CM | POA: Diagnosis not present

## 2023-04-20 DIAGNOSIS — E119 Type 2 diabetes mellitus without complications: Secondary | ICD-10-CM | POA: Diagnosis not present

## 2023-04-20 DIAGNOSIS — G894 Chronic pain syndrome: Secondary | ICD-10-CM | POA: Diagnosis not present

## 2023-04-20 DIAGNOSIS — H9193 Unspecified hearing loss, bilateral: Secondary | ICD-10-CM | POA: Diagnosis not present

## 2023-04-20 DIAGNOSIS — I152 Hypertension secondary to endocrine disorders: Secondary | ICD-10-CM | POA: Diagnosis not present

## 2023-04-20 DIAGNOSIS — H9319 Tinnitus, unspecified ear: Secondary | ICD-10-CM | POA: Diagnosis not present

## 2023-04-20 DIAGNOSIS — M5010 Cervical disc disorder with radiculopathy, unspecified cervical region: Secondary | ICD-10-CM | POA: Diagnosis not present

## 2023-05-07 ENCOUNTER — Other Ambulatory Visit: Payer: Medicare Other

## 2023-05-15 ENCOUNTER — Other Ambulatory Visit: Payer: Self-pay | Admitting: Physician Assistant

## 2023-05-15 DIAGNOSIS — E1142 Type 2 diabetes mellitus with diabetic polyneuropathy: Secondary | ICD-10-CM

## 2023-05-21 ENCOUNTER — Ambulatory Visit: Payer: Medicare Other

## 2023-05-21 DIAGNOSIS — Z89421 Acquired absence of other right toe(s): Secondary | ICD-10-CM

## 2023-05-21 DIAGNOSIS — E0843 Diabetes mellitus due to underlying condition with diabetic autonomic (poly)neuropathy: Secondary | ICD-10-CM

## 2023-05-21 DIAGNOSIS — R609 Edema, unspecified: Secondary | ICD-10-CM

## 2023-05-21 NOTE — Progress Notes (Signed)
Patient presents to the office today for diabetic shoe and insole measuring.  Patient was measured with brannock device to determine size and width for 1 pair of extra depth shoes and foam casted for 3 pair of insoles.   Documentation of medical necessity will be sent to patient's treating diabetic doctor to verify and sign.   Patient's diabetic provider: Julieanne Manson   Shoes and insoles will be ordered at that time and patient will be notified for an appointment for fitting when they arrive.   Shoe size (per patient): 9.5 Brannock measurement: 8 Patient shoe selection- Shoe choice:   brown 682 composite toe  If not avail darker color is fine  Shoe size ordered: 9 XWD  ABN and financials are signed  Addison Bailey CPed, CFo, CFm

## 2023-05-24 DIAGNOSIS — H903 Sensorineural hearing loss, bilateral: Secondary | ICD-10-CM | POA: Diagnosis not present

## 2023-05-24 DIAGNOSIS — H9313 Tinnitus, bilateral: Secondary | ICD-10-CM | POA: Diagnosis not present

## 2023-06-14 ENCOUNTER — Ambulatory Visit (INDEPENDENT_AMBULATORY_CARE_PROVIDER_SITE_OTHER): Payer: Medicare HMO

## 2023-06-14 DIAGNOSIS — R609 Edema, unspecified: Secondary | ICD-10-CM | POA: Diagnosis not present

## 2023-07-12 ENCOUNTER — Ambulatory Visit: Payer: 59 | Admitting: Dermatology

## 2023-07-12 DIAGNOSIS — W098XXA Fall on or from other playground equipment, initial encounter: Secondary | ICD-10-CM

## 2023-07-12 DIAGNOSIS — L578 Other skin changes due to chronic exposure to nonionizing radiation: Secondary | ICD-10-CM

## 2023-07-12 DIAGNOSIS — L821 Other seborrheic keratosis: Secondary | ICD-10-CM | POA: Diagnosis not present

## 2023-07-12 DIAGNOSIS — L570 Actinic keratosis: Secondary | ICD-10-CM

## 2023-07-12 DIAGNOSIS — L219 Seborrheic dermatitis, unspecified: Secondary | ICD-10-CM

## 2023-07-12 DIAGNOSIS — L820 Inflamed seborrheic keratosis: Secondary | ICD-10-CM

## 2023-07-12 DIAGNOSIS — D692 Other nonthrombocytopenic purpura: Secondary | ICD-10-CM

## 2023-07-12 DIAGNOSIS — L853 Xerosis cutis: Secondary | ICD-10-CM

## 2023-07-12 MED ORDER — HYDROCORTISONE 2.5 % EX CREA: TOPICAL_CREAM | CUTANEOUS | 5 refills | Status: DC

## 2023-07-12 MED ORDER — KETOCONAZOLE 2 % EX CREA: TOPICAL_CREAM | CUTANEOUS | 5 refills | Status: AC

## 2023-07-12 NOTE — Progress Notes (Signed)
Follow-Up Visit   Subjective  Jeff [name] is a 62 y.o. male who presents for the following: 6 month ak follow up. Reports a dry flaky bump at right eyebrow that is sometimes itchy and he picks off but it comes back.    The patient has spots, moles and lesions to be evaluated, some may be new or changing and the patient may have concern these could be cancer.   The following portions of the chart were reviewed this encounter and updated as appropriate: medications, allergies, medical history  Review of Systems:  No other skin or systemic complaints except as noted in HPI or Assessment and Plan.  Objective  Well appearing patient in no apparent distress; mood and affect are within normal limits.  A focused examination was performed of the following areas: face  Relevant exam findings are noted in the Assessment and Plan.  right eyebrow x 1 Erythematous stuck-on, waxy papule at right medial eyebrow  right hand dorsum x 2 , left hand dorsum x 1 (3) Keratotic papules    Assessment & Plan   SEBORRHEIC DERMATITIS eyebrows, scalp, beard area  Exam: Pink patches with greasy scale at b/l eyebrows  Chronic and persistent condition with duration or expected duration over one year. Condition is symptomatic / bothersome to patient. Not to goal. Currently flared    Seborrheic Dermatitis is a chronic persistent rash characterized by pinkness and scaling most commonly of the mid face but also can occur on the scalp (dandruff), ears; mid chest, mid back and groin.  It tends to be exacerbated by stress and cooler weather.  People who have neurologic disease may experience new onset or exacerbation of existing seborrheic dermatitis.  The condition is not curable but treatable and can be controlled.  Treatment Plan: Continue ketoconazole 2% cream Apply to AA QD/BID as directed dsp 60g 5Rf. Continue  2.5% hydrocortisone cream Apply to AA QD/BID as directed dsp 30g 6Rf.  Mix equal amounts  of hydrocortisone 2.5% cream with ketaconazole 2% cream and apply to affected areas twice a day.  If improved, decrease to hydrocortisone and ketaconazole mixed once a day. If still clear, decrease to ketaconazole cream only, once daily to help prevent flares.  Do not use the hydrocortisone cream for maintenance, since long term use of a topical steroid can thin the skin.  May repeat regimen as needed for flares.    Purpura - Chronic; persistent and recurrent.  Treatable, but not curable. - Violaceous macules and patches - Benign - Related to trauma, age, sun damage and/or use of blood thinners, chronic use of topical and/or oral steroids - Observe - Can use OTC arnica containing moisturizer such as Dermend Bruise Formula if desired - Call for worsening or other concerns  Xerosis - diffuse xerotic patches - recommend gentle, hydrating skin care - gentle skin care handout given   SEBORRHEIC KERATOSIS - Stuck-on, waxy, tan-brown papules and/or plaques  - Benign-appearing - Discussed benign etiology and prognosis. - Observe - Call for any changes   ACTINIC DAMAGE - chronic, secondary to cumulative UV radiation exposure/sun exposure over time - diffuse scaly erythematous macules with underlying dyspigmentation - Recommend daily broad spectrum sunscreen SPF 30+ to sun-exposed areas, reapply every 2 hours as needed.  - Recommend staying in the shade or wearing long sleeves, sun glasses (UVA+UVB protection) and wide brim hats (4-inch brim around the entire circumference of the hat). - Call for new or changing lesions.  Inflamed seborrheic keratosis right eyebrow  x 1  Symptomatic, irritating, patient would like treated.    Destruction of lesion - right eyebrow x 1  Destruction method: cryotherapy   Informed consent: discussed and consent obtained   Timeout:  patient name, date of birth, surgical site, and procedure verified Lesion destroyed using liquid nitrogen: Yes   Region  frozen until ice ball extended beyond lesion: Yes   Outcome: patient tolerated procedure well with no complications   Post-procedure details: wound care instructions given   Additional details:  Prior to procedure, discussed risks of blister formation, small wound, skin dyspigmentation, or rare scar following cryotherapy. Recommend Vaseline ointment to treated areas while healing.   Seborrheic dermatitis  Related Medications hydrocortisone 2.5 % cream Apply to flaky areas on face once to twice daily as directed.  ketoconazole (NIZORAL) 2 % cream Apply to flaky areas on face once to twice daily as directed.  Actinic keratosis (3) right hand dorsum x 2 , left hand dorsum x 1  Vs ISKs   Actinic keratoses are precancerous spots that appear secondary to cumulative UV radiation exposure/sun exposure over time. They are chronic with expected duration over 1 year. A portion of actinic keratoses will progress to squamous cell carcinoma of the skin. It is not possible to reliably predict which spots will progress to skin cancer and so treatment is recommended to prevent development of skin cancer.  Recommend daily broad spectrum sunscreen SPF 30+ to sun-exposed areas, reapply every 2 hours as needed.  Recommend staying in the shade or wearing long sleeves, sun glasses (UVA+UVB protection) and wide brim hats (4-inch brim around the entire circumference of the hat). Call for new or changing lesions.  Destruction of lesion - right hand dorsum x 2 , left hand dorsum x 1 (3)  Destruction method: cryotherapy   Informed consent: discussed and consent obtained   Lesion destroyed using liquid nitrogen: Yes   Region frozen until ice ball extended beyond lesion: Yes   Outcome: patient tolerated procedure well with no complications   Post-procedure details: wound care instructions given   Additional details:  Prior to procedure, discussed risks of blister formation, small wound, skin dyspigmentation, or  rare scar following cryotherapy. Recommend Vaseline ointment to treated areas while healing.     Return in about 6 months (around 01/10/2024) for ubse .  I, Asher Muir, CMA, am acting as scribe for Willeen Niece, MD.   Documentation: I have reviewed the above documentation for accuracy and completeness, and I agree with the above.  Willeen Niece, MD

## 2023-07-12 NOTE — Patient Instructions (Addendum)
For dry area at face   Recommend OTC Head & Shoulders shampoo 2-3x per week, massage into scalp and let sit 3-5 minutes before rinsing.   Mix equal amounts of hydrocortisone 2.5% cream with ketaconazole 2% cream and apply to affected areas twice a day.  If improved, decrease to hydrocortisone and ketaconazole mixed once a day. If still clear, decrease to ketaconazole cream only, once daily to help prevent flares.  Do not use the hydrocortisone cream for maintenance, since long term use of a topical steroid can thin the skin.  May repeat regimen as needed for flares.  Actinic keratoses are precancerous spots that appear secondary to cumulative UV radiation exposure/sun exposure over time. They are chronic with expected duration over 1 year. A portion of actinic keratoses will progress to squamous cell carcinoma of the skin. It is not possible to reliably predict which spots will progress to skin cancer and so treatment is recommended to prevent development of skin cancer.  Recommend daily broad spectrum sunscreen SPF 30+ to sun-exposed areas, reapply every 2 hours as needed.  Recommend staying in the shade or wearing long sleeves, sun glasses (UVA+UVB protection) and wide brim hats (4-inch brim around the entire circumference of the hat). Call for new or changing lesions.   Cryotherapy Aftercare  Wash gently with soap and water everyday.   Apply Vaseline and Band-Aid daily until healed.     Seborrheic Keratosis  What causes seborrheic keratoses? Seborrheic keratoses are harmless, common skin growths that first appear during adult life.  As time goes by, more growths appear.  Some people may develop a large number of them.  Seborrheic keratoses appear on both covered and uncovered body parts.  They are not caused by sunlight.  The tendency to develop seborrheic keratoses can be inherited.  They vary in color from skin-colored to gray, brown, or even black.  They can be either smooth or have a  rough, warty surface.   Seborrheic keratoses are superficial and look as if they were stuck on the skin.  Under the microscope this type of keratosis looks like layers upon layers of skin.  That is why at times the top layer may seem to fall off, but the rest of the growth remains and re-grows.    Treatment Seborrheic keratoses do not need to be treated, but can easily be removed in the office.  Seborrheic keratoses often cause symptoms when they rub on clothing or jewelry.  Lesions can be in the way of shaving.  If they become inflamed, they can cause itching, soreness, or burning.  Removal of a seborrheic keratosis can be accomplished by freezing, burning, or surgery. If any spot bleeds, scabs, or grows rapidly, please return to have it checked, as these can be an indication of a skin cancer.     Due to recent changes in healthcare laws, you may see results of your pathology and/or laboratory studies on MyChart before the doctors have had a chance to review them. We understand that in some cases there may be results that are confusing or concerning to you. Please understand that not all results are received at the same time and often the doctors may need to interpret multiple results in order to provide you with the best plan of care or course of treatment. Therefore, we ask that you please give [place] 2 business days to thoroughly review all your results before contacting the office for clarification. Should we see a critical lab result, you will be  contacted sooner.   If You Need Anything After Your Visit  If you have any questions or concerns for your doctor, please call our main line at [phone] and press option 4 to reach your doctor's medical assistant. If no one answers, please leave a voicemail as directed and we will return your call as soon as possible. Messages left after 4 pm will be answered the following business day.   You may also send [place] a message via MyChart. We typically respond  to MyChart messages within 1-2 business days.  For prescription refills, please ask your pharmacy to contact our office. Our fax number is [phone].  If you have an urgent issue when the clinic is closed that cannot wait until the next business day, you can page your doctor at the number below.    Please note that while we do our best to be available for urgent issues outside of office hours, we are not available 24/7.   If you have an urgent issue and are unable to reach [place], you may choose to seek medical care at your doctor's office, retail clinic, urgent care center, or emergency room.  If you have a medical emergency, please immediately call 911 or go to the emergency department.  Pager Numbers  - Dr. Gwen Pounds: [phone]  - Dr. Roseanne Reno: [phone]  - Dr. Katrinka Blazing: [phone]   In the event of inclement weather, please call our main line at [phone] for an update on the status of any delays or closures.  Dermatology Medication Tips: Please keep the boxes that topical medications come in in order to help keep track of the instructions about where and how to use these. Pharmacies typically print the medication instructions only on the boxes and not directly on the medication tubes.   If your medication is too expensive, please contact our office at [phone] option 4 or send [place] a message through MyChart.   We are unable to tell what your co-pay for medications will be in advance as this is different depending on your insurance coverage. However, we may be able to find a substitute medication at lower cost or fill out paperwork to get insurance to cover a needed medication.   If a prior authorization is required to get your medication covered by your insurance company, please allow [place] 1-2 business days to complete this process.  Drug prices often vary depending on where the prescription is filled and some pharmacies may offer cheaper prices.  The website www.goodrx.com  contains coupons for medications through different pharmacies. The prices here do not account for what the cost may be with help from insurance (it may be cheaper with your insurance), but the website can give you the price if you did not use any insurance.  - You can print the associated coupon and take it with your prescription to the pharmacy.  - You may also stop by our office during regular business hours and pick up a GoodRx coupon card.  - If you need your prescription sent electronically to a different pharmacy, notify our office through [facility] or by phone at [phone] option 4.     Si Usted Necesita Algo Despus de Su Visita  Tambin puede enviarnos un mensaje a travs de Clinical cytogeneticist. Por lo general respondemos a los mensajes de MyChart en el transcurso de 1 a 2 das hbiles.  Para renovar recetas, por favor pida a su farmacia que se ponga en contacto con nuestra oficina. Annie Sable de fax  es el [phone].  Si tiene un asunto urgente cuando la clnica est cerrada y que no puede esperar hasta el siguiente da hbil, puede llamar/localizar a su doctor(a) al nmero que aparece a continuacin.   Por favor, tenga en cuenta que aunque hacemos todo lo posible para estar disponibles para asuntos urgentes fuera del horario de [place], no estamos disponibles las 24 horas del da, los 7 [address]  [address] de la [place].   Si tiene un problema urgente y no puede comunicarse con nosotros, puede optar por buscar atencin mdica  en el consultorio de su doctor(a), en una clnica privada, en un centro de atencin urgente o en una sala de emergencias.  Si tiene Engineer, drilling, por favor llame inmediatamente al 911 o vaya a la sala de emergencias.  Nmeros de bper  - Dr. Gwen Pounds: [phone]  - Dra. Roseanne Reno: [phone]  - Dr. Katrinka Blazing: [phone]   En caso de inclemencias del tiempo, por favor llame a Lacy Duverney principal al [phone] para una actualizacin sobre el [place]  de cualquier retraso o cierre.  Consejos para la medicacin en dermatologa: Por favor, guarde las cajas en las que vienen los medicamentos de uso tpico para ayudarle a seguir las instrucciones sobre dnde y cmo usarlos. Las farmacias generalmente imprimen las instrucciones del medicamento slo en las cajas y no directamente en los tubos del [place].   Si su medicamento es muy caro, por favor, pngase en contacto con Rolm Gala llamando al [phone] y presione la opcin 4 o envenos un mensaje a travs de Clinical cytogeneticist.   No podemos decirle cul ser su copago por los medicamentos por adelantado ya que esto es diferente dependiendo de la cobertura de su seguro. Sin embargo, es posible que podamos encontrar un medicamento sustituto a Audiological scientist un formulario para que el seguro cubra el medicamento que se considera necesario.   Si se requiere una autorizacin previa para que su compaa de seguros [place] su medicamento, por favor permtanos de 1 a 2 das hbiles para completar [address]  Los precios de los medicamentos varan con frecuencia dependiendo del Environmental consultant de dnde se surte la receta y alguna farmacias pueden ofrecer precios ms baratos.  El sitio web www.goodrx.com tiene cupones para medicamentos de Health and safety inspector. Los precios aqu no tienen en cuenta lo que podra costar con la ayuda del seguro (puede ser ms barato con su seguro), pero el sitio web puede darle el precio si no utiliz Tourist information centre manager.  - Puede imprimir el cupn correspondiente y llevarlo con su receta a la farmacia.  - Tambin puede pasar por nuestra oficina durante el horario de atencin regular y Education officer, museum una tarjeta de cupones de GoodRx.  - Si necesita que su receta se enve electrnicamente a una farmacia diferente, informe a nuestra oficina a travs de MyChart de [place] o por telfono llamando al [phone] y presione la opcin 4.

## 2023-08-12 ENCOUNTER — Telehealth: Payer: Self-pay

## 2023-08-12 NOTE — Telephone Encounter (Signed)
 Tried calling pt to schedule diabetic shoe pick up. And the call will not let me dial and does not ring. I tried 3 times.

## 2023-09-16 ENCOUNTER — Telehealth: Payer: Self-pay

## 2023-09-16 NOTE — Telephone Encounter (Signed)
Left vm to schedule diabetic shoe pick up

## 2023-09-16 NOTE — Telephone Encounter (Signed)
Called pt back to make sure he wants appt in [place] I see he was seen in [place] before

## 2023-10-19 ENCOUNTER — Ambulatory Visit: Payer: Medicare Other

## 2023-10-19 NOTE — Progress Notes (Signed)
 Patient was here and tried on Dm shoes shoes and inserts fit well shoes and inserts provide overall support and protection to BIL feet  Ppw has expired I sent to Dr to have re-signed  I am taking shoes to [place] office and patient can PU once ppw is received back from Dr Sullivan Lone  Addison Bailey Cped, CFo, CFm

## 2023-12-02 NOTE — Progress Notes (Signed)
 Re-sent new ppw again to Dr. Once received patient can pu in [place]  I need to know when picked up

## 2023-12-21 ENCOUNTER — Encounter (INDEPENDENT_AMBULATORY_CARE_PROVIDER_SITE_OTHER): Payer: Self-pay

## 2023-12-30 NOTE — Telephone Encounter (Signed)
 Jeff [name]

## 2024-01-11 ENCOUNTER — Ambulatory Visit: Payer: Medicare HMO | Admitting: Dermatology

## 2024-01-16 ENCOUNTER — Other Ambulatory Visit: Payer: Self-pay | Admitting: Dermatology

## 2024-01-16 DIAGNOSIS — L219 Seborrheic dermatitis, unspecified: Secondary | ICD-10-CM

## 2024-01-31 DIAGNOSIS — H31011 Macula scars of posterior pole (postinflammatory) (post-traumatic), right eye: Secondary | ICD-10-CM | POA: Diagnosis not present

## 2024-01-31 DIAGNOSIS — Z0100 Encounter for examination of eyes and vision without abnormal findings: Secondary | ICD-10-CM | POA: Diagnosis not present

## 2024-01-31 DIAGNOSIS — H26492 Other secondary cataract, left eye: Secondary | ICD-10-CM | POA: Diagnosis not present

## 2024-01-31 DIAGNOSIS — M3501 Sicca syndrome with keratoconjunctivitis: Secondary | ICD-10-CM | POA: Diagnosis not present

## 2024-02-25 ENCOUNTER — Telehealth: Payer: Self-pay

## 2024-02-25 NOTE — Telephone Encounter (Signed)
 Shoes are in [place].. shoe order cancelled 01/31/24.SABRA only the statement of certifying physician was signed

## 2024-04-27 ENCOUNTER — Other Ambulatory Visit: Payer: Self-pay | Admitting: Family Medicine

## 2024-04-27 ENCOUNTER — Ambulatory Visit
Admission: RE | Admit: 2024-04-27 | Discharge: 2024-04-27 | Disposition: A | Discharge status: Home / Self Care | Source: Ambulatory Visit | Attending: Family Medicine | Admitting: Family Medicine

## 2024-04-27 DIAGNOSIS — M7989 Other specified soft tissue disorders: Secondary | ICD-10-CM | POA: Insufficient documentation

## 2024-04-28 ENCOUNTER — Ambulatory Visit

## 2024-05-01 ENCOUNTER — Ambulatory Visit: Admitting: Dermatology

## 2024-05-01 DIAGNOSIS — Z86018 Personal history of other benign neoplasm: Secondary | ICD-10-CM

## 2024-05-01 DIAGNOSIS — Q825 Congenital non-neoplastic nevus: Secondary | ICD-10-CM | POA: Diagnosis not present

## 2024-05-01 DIAGNOSIS — L578 Other skin changes due to chronic exposure to nonionizing radiation: Secondary | ICD-10-CM

## 2024-05-01 DIAGNOSIS — L821 Other seborrheic keratosis: Secondary | ICD-10-CM

## 2024-05-01 DIAGNOSIS — L570 Actinic keratosis: Secondary | ICD-10-CM

## 2024-05-01 DIAGNOSIS — Z1283 Encounter for screening for malignant neoplasm of skin: Secondary | ICD-10-CM

## 2024-05-01 DIAGNOSIS — D2239 Melanocytic nevi of other parts of face: Secondary | ICD-10-CM

## 2024-05-01 DIAGNOSIS — D1801 Hemangioma of skin and subcutaneous tissue: Secondary | ICD-10-CM

## 2024-05-01 DIAGNOSIS — L918 Other hypertrophic disorders of the skin: Secondary | ICD-10-CM

## 2024-05-01 DIAGNOSIS — L814 Other melanin hyperpigmentation: Secondary | ICD-10-CM

## 2024-05-01 DIAGNOSIS — D229 Melanocytic nevi, unspecified: Secondary | ICD-10-CM

## 2024-05-01 DIAGNOSIS — Z85828 Personal history of other malignant neoplasm of skin: Secondary | ICD-10-CM

## 2024-05-01 DIAGNOSIS — D692 Other nonthrombocytopenic purpura: Secondary | ICD-10-CM

## 2024-05-01 DIAGNOSIS — W908XXA Exposure to other nonionizing radiation, initial encounter: Secondary | ICD-10-CM

## 2024-05-01 NOTE — Patient Instructions (Addendum)

## 2024-05-01 NOTE — Progress Notes (Signed)
 Follow-Up Visit   Subjective  Jeff [name] is a 63 y.o. male who presents for the following: Skin Cancer Screening and Upper Body Skin Exam hx of BCC, Dysplastic Nevus, AKs  The patient presents for Upper Body Skin Exam (UBSE) for skin cancer screening and mole check. The patient has spots, moles and lesions to be evaluated, some may be new or changing and the patient may have concern these could be cancer.    The following portions of the chart were reviewed this encounter and updated as appropriate: medications, allergies, medical history  Review of Systems:  No other skin or systemic complaints except as noted in HPI or Assessment and Plan.  Objective  Well appearing patient in no apparent distress; mood and affect are within normal limits.  All skin waist up examined. Relevant physical exam findings are noted in the Assessment and Plan.  R forearm x 1 Hyperkeratotic scaly macule  Assessment & Plan   HYPERTROPHIC ACTINIC KERATOSIS R forearm x 1 Actinic keratoses are precancerous spots that appear secondary to cumulative UV radiation exposure/sun exposure over time. They are chronic with expected duration over 1 year. A portion of actinic keratoses will progress to squamous cell carcinoma of the skin. It is not possible to reliably predict which spots will progress to skin cancer and so treatment is recommended to prevent development of skin cancer.  Recommend daily broad spectrum sunscreen SPF 30+ to sun-exposed areas, reapply every 2 hours as needed.  Recommend staying in the shade or wearing long sleeves, sun glasses (UVA+UVB protection) and wide brim hats (4-inch brim around the entire circumference of the hat). Call for new or changing lesions.  Destruction of lesion - R forearm x 1  Destruction method: cryotherapy   Informed consent: discussed and consent obtained   Lesion destroyed using liquid nitrogen: Yes   Region frozen until ice ball extended beyond lesion: Yes    Outcome: patient tolerated procedure well with no complications   Post-procedure details: wound care instructions given   Additional details:  Prior to procedure, discussed risks of blister formation, small wound, skin dyspigmentation, or rare scar following cryotherapy. Recommend Vaseline ointment to treated areas while healing.   Skin cancer screening performed today.  Actinic Damage Back, arms - Chronic condition, secondary to cumulative UV/sun exposure - diffuse scaly erythematous macules with underlying dyspigmentation - Recommend daily broad spectrum sunscreen SPF 30+ to sun-exposed areas, reapply every 2 hours as needed.  - Staying in the shade or wearing long sleeves, sun glasses (UVA+UVB protection) and wide brim hats (4-inch brim around the entire circumference of the hat) are also recommended for sun protection.  - Call for new or changing lesions.  Lentigines, Seborrheic Keratoses, Hemangiomas - Benign normal skin lesions - Benign-appearing - Call for any changes  Melanocytic Nevi - Tan-brown and/or pink-flesh-colored symmetric macules and papules - Benign appearing on exam today - Observation - Call clinic for new or changing moles - Recommend daily use of broad spectrum spf 30+ sunscreen to sun-exposed areas.  - R medial eyebrow flesh pap  HISTORY OF BASAL CELL CARCINOMA OF THE SKIN - No evidence of recurrence today- R upper temple, R spinal upper back - Recommend regular full body skin exams - Recommend daily broad spectrum sunscreen SPF 30+ to sun-exposed areas, reapply every 2 hours as needed.  - Call if any new or changing lesions are noted between office visits    HISTORY OF DYSPLASTIC NEVUS No evidence of recurrence today- R malar  cheek Recommend regular full body skin exams Recommend daily broad spectrum sunscreen SPF 30+ to sun-exposed areas, reapply every 2 hours as needed.  Call if any new or changing lesions are noted between office visits    Purpura  - Chronic; persistent and recurrent.  Treatable, but not curable. 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  Stable. Observation.  Call clinic for new or changing moles.   Recommend daily use of broad spectrum spf 30+ sunscreen to sun-exposed areas.    ABCDEs of mole observation discussed and patient handout given.  RTC if any changes noted.  Acrochordons (Skin Tags) L nipple - Fleshy, skin-colored pedunculated papule - Benign appearing.  - Observe. - If desired, they can be removed with an in office procedure (snip excision) - Please call the clinic if you notice any new or changing lesions.   Return in about 1 year (around 05/01/2025) for UBSE, Hx of BCC, Hx of Dysplastic nevi, Hx of AKs.  I, Grayce Saunas, RMA, am acting as scribe for Rexene Rattler, MD .   Documentation: I have reviewed the above documentation for accuracy and completeness, and I agree with the above.  Rexene Rattler, MD

## 2024-05-02 ENCOUNTER — Other Ambulatory Visit

## 2024-07-07 NOTE — Progress Notes (Signed)
 Chief Complaint: Chief Complaint  Patient presents with   Neck Pain      HPI: Patient is a pleasant 63 y.o. male seen in consultation at the request of Dr. Bertrum for the evaluation of neck pain traveling into the right arm.  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  This did necessitate surgery for both his neck and his lumbar spine.  He states that since then he has had multiple cervical epidural injections with no improvement.  He rates his pain as a 9/10.  It is constant, throbbing.  He does note tingling in the right arm and hand but denies any numbness, weakness, loss of control of bowel or bladder.  Pain has been getting worse.  It is worse with lifting and better with nothing.  He does endorse that in the distant past he was treated at a pain clinic and was on oxycodone  which did provide him with relief.  His physician has since retired and he has been out of oxycodone .  He also endorses a history of bilateral carpal tunnel release surgeries.    Review of Systems: A 10 point review of systems is negative, except for the pertinent positives and negatives detailed in the HPI.  PMH: Past Medical History:  Diagnosis Date   Abnormal kidney function    Adiposity    Bernhardt's paresthesia    Bulge of cervical disc without myelopathy    Cephalalgia    Cervical facet syndrome    Cervical nerve root disorder    Cervical post-laminectomy syndrome    Chest pain, unspecified    Chronic neck pain    Chronic pain associated with significant psychosocial dysfunction    Clinical depression    Colon polyp    DDD (degenerative disc disease), cervical    DDD (degenerative disc disease), lumbar    Diabetes mellitus without complication (CMS/HHS-HCC)    Displacement of lumbar intervertebral disc without myelopathy    Eunuchoidism    Failed back syndrome of thoracic spine    Hyperlipidemia associated with type 2 diabetes mellitus (CMS/HHS-HCC)    Hypertension     Hypokalemia    L-S radiculopathy    Lyme disease    Mild major depression ()    Neuropathy    PAD (peripheral artery disease)    Peripheral neuropathic pain    Post laminectomy syndrome    PVD (peripheral vascular disease)    Sacroiliac joint dysfunction    Seizures (CMS/HHS-HCC)    Skin cancer    Sleep apnea    Syncope    Thoracic spinal stenosis    Tinnitus    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     PSH: Past Surgical History:  Procedure Laterality Date   KNEE ARTHROSCOPY Left 1994   ANTERIOR FUSION CERVICAL SPINE  2012   LUMBAR LAMINECTOMY/DECOMPRESSION MICRODISCECTOMY  06/22/2011   Dr Reyes Budge   coronary angioplasty  2014   cardiac cath  01/31/2013   CATARACT EXTRACTION  2014 2017   ENDOSCOPIC CARPAL TUNNEL RELEASE     Spinal cord stimulator     VASECTOMY       Family History: No family history on file.   Social History: Social History   Socioeconomic History   Marital status: Widowed  Tobacco Use   Smoking status: Former   Smokeless tobacco: Never  Substance and Sexual Activity   Alcohol use: Never   Drug use:  Never   Social Drivers of Corporate Investment Banker Strain: Low Risk  (07/07/2024)   Overall Financial Resource Strain (CARDIA)    Difficulty of Paying Living Expenses: Not hard at all  Food Insecurity: No Food Insecurity (07/07/2024)   Hunger Vital Sign    Worried About Running Out of Food in the Last Year: Never true    Ran Out of Food in the Last Year: Never true  Transportation Needs: No Transportation Needs (07/07/2024)   PRAPARE - Administrator, Civil Service (Medical): No    Lack of Transportation (Non-Medical): No     Allergies: Allergies  Allergen Reactions   Duloxetine  Other (See Comments)    jittery and loopy per pt jittery and loopy per pt    Gabapentin  Other (See Comments)    caused severe  tremor jittery and loopy per pt.    Levetiracetam  Other (See Comments) and Palpitations    jittery, and loopy. per pt.    Topiramate  Other (See Comments)    unresponsive  episodes unresponsive  episodes    Pregabalin  Swelling     Medications:  Current Outpatient Medications:    acetaminophen  (TYLENOL ) 500 MG tablet, Take 500 mg by mouth every 6 (six) hours as needed, Disp: , Rfl:    amLODIPine  (NORVASC ) 10 MG tablet, Take 1 tablet (10 mg total) by mouth once daily, Disp: 90 tablet, Rfl: 3   amoxicillin-clavulanate (AUGMENTIN) 875-125 mg tablet, Take 1 tablet (875 mg total) by mouth 2 (two) times daily for 7 days, Disp: 20 tablet, Rfl: 0   ARIPiprazole  (ABILIFY ) 5 MG tablet, Take 1 tablet by mouth once daily, Disp: , Rfl:    aspirin  81 MG chewable tablet, Take 81 mg by mouth, Disp: , Rfl:    atorvastatin  (LIPITOR) 10 MG tablet, , Disp: , Rfl:    blood glucose diagnostic (GLUCOSE BLOOD) test strip, Check sugar once daily DX E11.9, Disp: , Rfl:    empagliflozin  (JARDIANCE ) 25 mg tablet, Take 1 tablet (25 mg total) by mouth once daily, Disp: 90 tablet, Rfl: 3   gentamicin  0.1 % cream, Apply topically, Disp: , Rfl:    hydrALAZINE  (APRESOLINE ) 25 MG tablet, Take 1 tablet (25 mg total) by mouth 2 (two) times daily, Disp: 180 tablet, Rfl: 3   hydrocortisone  2.5 % cream, Apply topically, Disp: , Rfl:    losartan  (COZAAR ) 100 MG tablet, TAKE 1 TABLET BY MOUTH EVERY DAY, Disp: 30 tablet, Rfl: 11   meloxicam (MOBIC) 15 MG tablet, Take 1 tablet (15 mg total) by mouth once daily as needed for Pain, Disp: 30 tablet, Rfl: 1   metFORMIN  (GLUCOPHAGE ) 1000 MG tablet, Take 1 tablet (1,000 mg total) by mouth 2 (two) times daily with meals, Disp: 180 tablet, Rfl: 3   methocarbamoL  (ROBAXIN ) 500 MG tablet, Take 500 mg by mouth 3 (three) times daily, Disp: , Rfl:    metoprolol  SUCCinate (TOPROL -XL) 50 MG XL tablet, TAKE 1 TABLET BY MOUTH EVERY DAY, Disp: 30 tablet, Rfl: 11    pregabalin  (LYRICA ) 300 MG capsule, TAKE 1 CAPSULE BY MOUTH TWICE A DAY, Disp: 180 capsule, Rfl: 1   rosuvastatin  (CRESTOR ) 40 MG tablet, TAKE 1 TABLET BY MOUTH EVERY DAY, Disp: 30 tablet, Rfl: 5   sildenafiL , pulm.hypertension, (REVATIO ) 20 mg tablet, Take 1-5 tablets by mouth once daily as needed, Disp: , Rfl:    silver  sulfADIAZINE  (SILVADENE , SSD) 1 % cream, Apply 1 Application topically once daily, Disp: , Rfl:   Vitals: Vitals:  07/07/24 1311  BP: (!) 165/72  Pulse: 63  Temp: 36.4 C (97.6 F)  TempSrc: Oral  Weight: 99.8 kg (220 lb)  Height: 180.3 cm (5' 11)  PainSc:   9  PainLoc: Neck     Physical Exam: Constitutional: vital signs reviewed, healthy appearing  Psych: normal affect and mood, pain behaviors consistent throughout exam, appropriate judgement and insight  Skin: no rashes or lesions, on palpation there are no palpable nodules or areas of induration  Resp: even and unlabored, no use of accessory muscles, no tactile fremitis  Cardiac: no clubbing, cyanosis, edema  GI: nondistended, nontender  MSK: normal inspection cervical spine, limited range of motion of cervical spine in all planes, positive Spurling's right  Neuro: sensation to soft touch decreased throughout right hand compared to left, motor exam 5/5 both arms, reflexes 2+ BL biceps, triceps, and brachioradialis, negative Hoffmann's sign bilaterally  Imaging: CT for cervical spine 01/2022 done at [facility]: Posterior central canal neurostimulator  GFR 84 drawn on 02/16/2024 Platelet count 163 drawn on 02/16/2024  Assessment: Cervicalgia Right cervical radiculitis History of C5-7 ACDF done at outside facility 2012 History of lumbar discectomy done at outside facility 2012 History of bilateral carpal tunnel release History of subarachnoid hemorrhage History of neuropathy History of diabetes with hemoglobin A1c 6 drawn 06/21/2024 History of hypertension  Plan: I did review the referring  physician's note as well as a CT cervical spine.  I also reviewed the CMP and CBC from 02/16/2024 as well as a hemoglobin A1c from 06/21/2024.  Please see pertinent values above.  Patient is already on meloxicam, methocarbamol  and Lyrica .  He states that he was on oxycodone  in the distant past at the pain management clinic but his physician has since retired.  I did discuss with him that we do not do long-term narcotics.  He is interested in being set up with pain management.  Referral placed to [facility] pain management.  We did spend some time discussing other treatment options including physical therapy as well as epidural steroid injections.  He states that he has had multiple epidural steroid injections in [place], [name]  as well as [place] [facility].  He is not interested in any further injections at this time.  In terms of the right hand numbness and tingling, he does have a history of carpal tunnel release surgery but he continues to have worsening numbness, tingling and weakness.  I do think it is worthwhile to move forward with an EMG of the right upper extremity to further evaluate.  EMG ordered.  Follow-up as needed.  Will call him with results of the EMG.   Patient agrees with above plan.  Answered all questions.

## 2024-08-16 ENCOUNTER — Other Ambulatory Visit: Payer: Self-pay | Admitting: Gastroenterology

## 2024-08-16 DIAGNOSIS — R1319 Other dysphagia: Secondary | ICD-10-CM

## 2024-08-16 NOTE — Progress Notes (Signed)
 "  New Patient / Consultation Note   Patient ID: Jeff [name] is a 64 y.o. male  Date of Visit: 08/16/2024  Requesting Provider: Charlie Sonny Forte,*  PCP: Forte Charlie Sonny, MD  Reason for Consultation: dysphagia  Patient's Chief Complaint:   Chief Complaint  Patient presents with   Dysphagia    64 year old Caucasian male with history of chronic pain syndrome with spinal stimulator, C-spine fusion, DM2 with neuropathy, ASCVD, OSA, colon polyps who presents in consultation for dysphagia.  Patient notes dysphagia has been coming worse over the last year.  He notes when he swallows solids it tends to cause increased pressure on 1 side of his neck and he will regurgitate undigested food.  He reports his father had a similar problem.  He reports his father had surgery for this and describes what sounds like a repair of a Zenker diverticulum. Patient has no issues with liquids.  Rare issues with tablets.  No odynophagia.  Weight and appetite have been stable.  No nausea or vomiting.  Denies any reflux symptoms.  Patient reports his bowels are regular without melena or hematochezia.  No hip replacement, AICD or PPM.  C5-7 are fused with plate in January 2012.  Denies NSAIDs, Anti-plt agents, and anticoagulants Denies family history of gastrointestinal disease and malignancy Previous Endoscopies: September 2014 colonoscopy-4 adenomatous polyps   Past Medical History:  Diagnosis Date   Abnormal kidney function    Adiposity    Bernhardt's paresthesia    Bulge of cervical disc without myelopathy    Cephalalgia    Cervical facet syndrome    Cervical nerve root disorder    Cervical post-laminectomy syndrome    Chest pain, unspecified    Chronic neck pain    Chronic pain associated with significant psychosocial dysfunction    Clinical depression    Colon polyp    DDD (degenerative disc disease), cervical    DDD (degenerative disc disease), lumbar     Diabetes mellitus without complication (CMS/HHS-HCC)    Displacement of lumbar intervertebral disc without myelopathy    Eunuchoidism    Failed back syndrome of thoracic spine    Hyperlipidemia associated with type 2 diabetes mellitus (CMS/HHS-HCC)    Hypertension    Hypokalemia    L-S radiculopathy    Lyme disease    Mild major depression ()    Neuropathy    PAD (peripheral artery disease)    Peripheral neuropathic pain    Post laminectomy syndrome    PVD (peripheral vascular disease)    Sacroiliac joint dysfunction    Seizures (CMS/HHS-HCC)    Skin cancer    Sleep apnea    Syncope    Thoracic spinal stenosis    Tinnitus    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    Past Surgical History:  Procedure Laterality Date   KNEE ARTHROSCOPY Left 1994   ANTERIOR FUSION CERVICAL SPINE  2012   LUMBAR LAMINECTOMY/DECOMPRESSION MICRODISCECTOMY  06/22/2011   Dr Reyes Budge   coronary angioplasty  2014   cardiac cath  01/31/2013   CATARACT EXTRACTION  2014 2017   ENDOSCOPIC CARPAL TUNNEL RELEASE     Spinal cord stimulator     VASECTOMY      Allergies  Allergen Reactions   Duloxetine  Other (See Comments)    jittery and loopy per pt jittery and loopy per pt    Gabapentin  Other (See Comments)  caused severe tremor jittery and loopy per pt.    Levetiracetam  Other (See Comments) and Palpitations    jittery, and loopy. per pt.    Topiramate  Other (See Comments)    unresponsive  episodes unresponsive  episodes    Pregabalin  Swelling    No family history on file.  Patient denies family history of GI Disease or malignancy, inflammatory bowel disease, or solid organ transplantation  Social History   Tobacco Use   Smoking status: Former   Smokeless tobacco: Never  Substance Use Topics   Alcohol use: Never   Drug use: Never     Pertinent GI related  history and allergies were reviewed with the patient  Review of Systems  Constitutional:  Negative for activity change, appetite change, chills, fatigue, fever and unexpected weight change.  HENT:  Positive for trouble swallowing. Negative for voice change.   Respiratory:  Negative for shortness of breath and wheezing.   Cardiovascular:  Negative for chest pain and palpitations.  Gastrointestinal:  Negative for abdominal distention, abdominal pain, anal bleeding, blood in stool, constipation, diarrhea, nausea, rectal pain and vomiting.  Genitourinary:  Negative for difficulty urinating and dysuria.  Skin:  Negative for color change and pallor.  Neurological:  Negative for dizziness, syncope, weakness and light-headedness.  Psychiatric/Behavioral:  Negative for agitation and confusion.   All other systems reviewed and are negative.    Medications Current Outpatient Medications on File Prior to Visit  Medication Sig Dispense Refill   acetaminophen  (TYLENOL ) 500 MG tablet Take 500 mg by mouth every 6 (six) hours as needed     atorvastatin  (LIPITOR) 10 MG tablet      blood glucose diagnostic (GLUCOSE BLOOD) test strip Check sugar once daily DX E11.9     empagliflozin  (JARDIANCE ) 25 mg tablet Take 1 tablet (25 mg total) by mouth once daily 90 tablet 3   gentamicin  0.1 % cream Apply topically     hydrocortisone  2.5 % cream Apply topically     losartan  (COZAAR ) 100 MG tablet TAKE 1 TABLET BY MOUTH EVERY DAY 30 tablet 11   meloxicam (MOBIC) 15 MG tablet Take 1 tablet (15 mg total) by mouth once daily as needed for Pain 30 tablet 1   metFORMIN  (GLUCOPHAGE ) 1000 MG tablet Take 1 tablet (1,000 mg total) by mouth 2 (two) times daily with meals 180 tablet 3   metoprolol  SUCCinate (TOPROL -XL) 50 MG XL tablet TAKE 1 TABLET BY MOUTH EVERY DAY 30 tablet 11   pregabalin  (LYRICA ) 300 MG capsule TAKE 1 CAPSULE BY MOUTH TWICE A DAY 180 capsule 1   rosuvastatin  (CRESTOR ) 40 MG tablet TAKE 1 TABLET  BY MOUTH EVERY DAY 30 tablet 5   amLODIPine  (NORVASC ) 10 MG tablet Take 1 tablet (10 mg total) by mouth once daily (Patient not taking: Reported on 08/16/2024) 90 tablet 3   ARIPiprazole  (ABILIFY ) 5 MG tablet Take 1 tablet by mouth once daily (Patient not taking: Reported on 08/16/2024)     aspirin  81 MG chewable tablet Take 81 mg by mouth (Patient not taking: Reported on 08/16/2024)     hydrALAZINE  (APRESOLINE ) 25 MG tablet TAKE 1 TABLET BY MOUTH TWICE A DAY (Patient not taking: Reported on 08/16/2024) 60 tablet 11   methocarbamoL  (ROBAXIN ) 500 MG tablet Take 500 mg by mouth 3 (three) times daily (Patient not taking: Reported on 08/16/2024)     sildenafiL , pulm.hypertension, (REVATIO ) 20 mg tablet Take 1-5 tablets by mouth once daily as needed (Patient not taking: Reported on 08/16/2024)  silver  sulfADIAZINE  (SILVADENE , SSD) 1 % cream Apply 1 Application topically once daily (Patient not taking: Reported on 08/16/2024)     No current facility-administered medications on file prior to visit.    Pertinent GI related medications were reviewed with the patient  Objective  Vitals:   08/16/24 1310  BP: 129/71  Pulse: 60  Temp: 36.8 C (98.3 F)  TempSrc: Oral  Weight: 95.2 kg (209 lb 12.8 oz)  Height: 172.7 cm (5' 8)    Wt Readings from Last 3 Encounters:  08/16/24 95.2 kg (209 lb 12.8 oz)  07/07/24 99.8 kg (220 lb)  07/05/24 99.8 kg (220 lb)   Body mass index is 31.9 kg/m.   Physical Exam Vitals and nursing note reviewed.  Constitutional:      General: He is not in acute distress.    Appearance: Normal appearance. He is well-developed and normal weight. He is not ill-appearing, toxic-appearing or diaphoretic.  HENT:     Head: Normocephalic and atraumatic.     Nose: Nose normal.     Mouth/Throat:     Mouth: Mucous membranes are moist.     Pharynx: Oropharynx is clear.  Eyes:     General: No scleral icterus.    Extraocular Movements: Extraocular movements intact.   Cardiovascular:     Rate and Rhythm: Normal rate and regular rhythm.     Heart sounds: Normal heart sounds. No murmur heard.    No friction rub. No gallop.  Pulmonary:     Effort: Pulmonary effort is normal. No respiratory distress.     Breath sounds: Normal breath sounds. No wheezing, rhonchi or rales.  Abdominal:     General: Bowel sounds are normal. There is no distension.     Palpations: Abdomen is soft.     Tenderness: There is no abdominal tenderness. There is no guarding or rebound.     Comments: no rigidity, non peritoneal   Genitourinary:    Comments: Rectal exam deferred Musculoskeletal:     Cervical back: Neck supple.     Right lower leg: No edema.     Left lower leg: No edema.     Comments: L sided stimulator in lower back  Skin:    General: Skin is warm and dry.     Coloration: Skin is not jaundiced or pale.  Neurological:     General: No focal deficit present.     Mental Status: He is alert and oriented to person, place, and time. Mental status is at baseline.  Psychiatric:        Mood and Affect: Mood normal.        Behavior: Behavior normal.        Thought Content: Thought content normal.        Judgment: Judgment normal.     Laboratory Data: 08-12-2025labs reviewed by me AST 40 ALT 36 total bili 0.7 alk phos 59 A1c 6 B12 185 Hemoglobin 16.5 MCV 96 platelets 1 63,000  Imaging Studies: CT chest abdomen pelvis 03-14-2022 reviewed- normal esophagus; persistent right sided hemidiaphragm elevation; left sided diverticulosis;   Assessment:   # Dysphagia - 1 year duration - started well after his c spine fusion  # Phx colon polyps - 4 ta in 2014 - overdue for surveillance  # B12 deficiency  # Right hemidiaphragm elevation  Other medical comorbidities taken into consideration for upcoming endoscopy and sedation; OSA, chronic pain syndrome, history of SAH, ASCVD, DM2, neurostimulator  Plan:   Review of PCP note  Reviewed labs and imaging Reviewed  colonoscopy records  Plan for upper endoscopy to evaluate dysphagia and potential dilation Prior to EGD will perform esophagram to rule out Zenker's diverticulum Schedule colonoscopy for colon polyp surveillance as patient is overdue Plan for both these procedures at [facility] with GoLytely prep He is not currently on opioids and reports that he is moving his bowels well  Recommend b12 supplementation  Esophagogastroduodenoscopy and colonoscopy with possible biopsy, control of bleeding, polypectomy, and interventions as necessary has been discussed with the patient/patient representative. Informed consent was obtained from the patient/patient representative after explaining the indication, nature, and risks of the procedure including but not limited to death, bleeding, perforation, missed neoplasm/lesions, cardiorespiratory compromise, and reaction to medications. Opportunity for questions was given and appropriate answers were provided. Patient/patient representative has verbalized understanding is amenable to undergoing the procedure.  40 minutes were spent in this encounter including pre-visit review of records (including pertinent notes, labs, imaging), in office visit, and post visit documentation Thank you for allowing us  to participate in this patient's care. Please do not hesitate to call if any questions or concerns arise.    Attestation Statement:   I personally performed the service. (TP)  STEVEN MICHAEL RUSSO, DO   [facility]   Portions of the record may have been created with voice recognition software. Occasional wrong-word or 'sound-a-like' substitutions may have occurred due to the inherent limitations of voice recognition software.  Read the chart carefully and recognize, using context, where substitutions may have occurred. "

## 2024-08-17 ENCOUNTER — Ambulatory Visit
Admission: RE | Admit: 2024-08-17 | Discharge: 2024-08-17 | Disposition: A | Discharge status: Home / Self Care | Source: Ambulatory Visit | Attending: Gastroenterology | Admitting: Gastroenterology

## 2024-08-17 DIAGNOSIS — R1319 Other dysphagia: Secondary | ICD-10-CM | POA: Insufficient documentation

## 2024-08-24 ENCOUNTER — Encounter: Admission: RE | Disposition: A | Payer: Self-pay | Source: Home / Self Care | Attending: Gastroenterology

## 2024-08-24 ENCOUNTER — Ambulatory Visit: Admitting: Anesthesiology

## 2024-08-24 ENCOUNTER — Ambulatory Visit
Admission: RE | Admit: 2024-08-24 | Discharge: 2024-08-24 | Disposition: A | Discharge status: Home / Self Care | Attending: Gastroenterology | Admitting: Gastroenterology

## 2024-08-24 ENCOUNTER — Encounter: Payer: Self-pay | Admitting: Gastroenterology

## 2024-08-24 DIAGNOSIS — E1151 Type 2 diabetes mellitus with diabetic peripheral angiopathy without gangrene: Secondary | ICD-10-CM | POA: Diagnosis not present

## 2024-08-24 DIAGNOSIS — K31A19 Gastric intestinal metaplasia without dysplasia, unspecified site: Secondary | ICD-10-CM | POA: Diagnosis not present

## 2024-08-24 DIAGNOSIS — R1310 Dysphagia, unspecified: Secondary | ICD-10-CM | POA: Diagnosis not present

## 2024-08-24 DIAGNOSIS — M1990 Unspecified osteoarthritis, unspecified site: Secondary | ICD-10-CM | POA: Diagnosis not present

## 2024-08-24 DIAGNOSIS — Q396 Congenital diverticulum of esophagus: Secondary | ICD-10-CM | POA: Diagnosis not present

## 2024-08-24 DIAGNOSIS — G4730 Sleep apnea, unspecified: Secondary | ICD-10-CM | POA: Diagnosis not present

## 2024-08-24 DIAGNOSIS — K3189 Other diseases of stomach and duodenum: Secondary | ICD-10-CM | POA: Insufficient documentation

## 2024-08-24 DIAGNOSIS — K2100 Gastro-esophageal reflux disease with esophagitis, without bleeding: Secondary | ICD-10-CM | POA: Insufficient documentation

## 2024-08-24 DIAGNOSIS — I10 Essential (primary) hypertension: Secondary | ICD-10-CM | POA: Insufficient documentation

## 2024-08-24 DIAGNOSIS — K643 Fourth degree hemorrhoids: Secondary | ICD-10-CM | POA: Insufficient documentation

## 2024-08-24 DIAGNOSIS — Z1211 Encounter for screening for malignant neoplasm of colon: Secondary | ICD-10-CM | POA: Insufficient documentation

## 2024-08-24 DIAGNOSIS — Z6831 Body mass index (BMI) 31.0-31.9, adult: Secondary | ICD-10-CM | POA: Insufficient documentation

## 2024-08-24 DIAGNOSIS — K2970 Gastritis, unspecified, without bleeding: Secondary | ICD-10-CM | POA: Diagnosis not present

## 2024-08-24 DIAGNOSIS — D124 Benign neoplasm of descending colon: Secondary | ICD-10-CM | POA: Diagnosis not present

## 2024-08-24 DIAGNOSIS — D120 Benign neoplasm of cecum: Secondary | ICD-10-CM | POA: Diagnosis not present

## 2024-08-24 DIAGNOSIS — K222 Esophageal obstruction: Secondary | ICD-10-CM | POA: Diagnosis not present

## 2024-08-24 DIAGNOSIS — E66813 Obesity, class 3: Secondary | ICD-10-CM | POA: Insufficient documentation

## 2024-08-24 DIAGNOSIS — Z7984 Long term (current) use of oral hypoglycemic drugs: Secondary | ICD-10-CM | POA: Insufficient documentation

## 2024-08-24 DIAGNOSIS — D122 Benign neoplasm of ascending colon: Secondary | ICD-10-CM | POA: Insufficient documentation

## 2024-08-24 DIAGNOSIS — K5730 Diverticulosis of large intestine without perforation or abscess without bleeding: Secondary | ICD-10-CM | POA: Diagnosis not present

## 2024-08-24 HISTORY — PX: HEMOSTASIS CLIP PLACEMENT: SHX6857

## 2024-08-24 HISTORY — PX: ESOPHAGOGASTRODUODENOSCOPY: SHX5428

## 2024-08-24 HISTORY — PX: POLYPECTOMY: SHX149

## 2024-08-24 HISTORY — PX: SUBMUCOSAL INJECTION: SHX5543

## 2024-08-24 HISTORY — PX: COLONOSCOPY: SHX5424

## 2024-08-24 MED ORDER — LIDOCAINE HCL (CARDIAC) PF 100 MG/5ML IV SOSY
PREFILLED_SYRINGE | INTRAVENOUS | Status: DC | PRN
  Administered 2024-08-24: 80 mg via INTRAVENOUS

## 2024-08-24 MED ORDER — PROPOFOL 10 MG/ML IV BOLUS
INTRAVENOUS | Status: DC | PRN
  Administered 2024-08-24 (×2): 50 mg via INTRAVENOUS

## 2024-08-24 MED ORDER — SODIUM CHLORIDE 0.9 % IV SOLN: INTRAVENOUS | Status: DC

## 2024-08-24 MED ORDER — GLYCOPYRROLATE 0.2 MG/ML IJ SOLN
INTRAMUSCULAR | Status: AC
  Filled 2024-08-24: qty 2

## 2024-08-24 MED ORDER — PROPOFOL 1000 MG/100ML IV EMUL
INTRAVENOUS | Status: AC
  Filled 2024-08-24: qty 100

## 2024-08-24 MED ORDER — DEXMEDETOMIDINE HCL IN NACL 80 MCG/20ML IV SOLN
INTRAVENOUS | Status: DC | PRN
  Administered 2024-08-24: 8 ug via INTRAVENOUS
  Administered 2024-08-24: 12 ug via INTRAVENOUS

## 2024-08-24 MED ORDER — PHENYLEPHRINE 80 MCG/ML (10ML) SYRINGE FOR IV PUSH (FOR BLOOD PRESSURE SUPPORT)
PREFILLED_SYRINGE | INTRAVENOUS | Status: AC
  Filled 2024-08-24: qty 10

## 2024-08-24 MED ORDER — PROPOFOL 500 MG/50ML IV EMUL
INTRAVENOUS | Status: DC | PRN
  Administered 2024-08-24: 75 ug/kg/min via INTRAVENOUS

## 2024-08-24 MED ORDER — PHENYLEPHRINE 80 MCG/ML (10ML) SYRINGE FOR IV PUSH (FOR BLOOD PRESSURE SUPPORT)
PREFILLED_SYRINGE | INTRAVENOUS | Status: DC | PRN
  Administered 2024-08-24 (×2): 160 ug via INTRAVENOUS
  Administered 2024-08-24 (×2): 80 ug via INTRAVENOUS
  Administered 2024-08-24: 160 ug via INTRAVENOUS
  Administered 2024-08-24: 80 ug via INTRAVENOUS

## 2024-08-24 NOTE — Transfer of Care (Signed)
 Immediate Anesthesia Transfer of Care Note  Patient: Jeff [name]  Procedure(s) Performed: EGD (ESOPHAGOGASTRODUODENOSCOPY) COLONOSCOPY Balloon dilation wire-guided  Patient Location: Endoscopy Unit  Anesthesia Type:General  Level of Consciousness: drowsy  Airway & Oxygen  Therapy: Patient Spontanous Breathing  Post-op Assessment: Report given to RN and Post -op Vital signs reviewed and stable  Post vital signs: Reviewed and stable  Last Vitals:  Vitals Value Taken Time  BP 99/67 08/24/24 15:10  Temp 35.3 C 08/24/24 15:10  Pulse 71 08/24/24 15:10  Resp 10 08/24/24 15:10  SpO2 96 % 08/24/24 15:10    Last Pain:  Vitals:   08/24/24 1510  TempSrc: Tympanic  PainSc: Asleep         Complications: No notable events documented.

## 2024-08-24 NOTE — Anesthesia Preprocedure Evaluation (Signed)
 "                                  Anesthesia Evaluation  Patient identified by MRN, date of birth, ID band Patient awake    Reviewed: Allergy & Precautions, NPO status , Patient's Chart, lab work & pertinent test results  Airway Mallampati: II  TM Distance: >3 FB Neck ROM: Full    Dental  (+) Teeth Intact   Pulmonary neg pulmonary ROS, sleep apnea , Patient abstained from smoking., former smoker   Pulmonary exam normal  + decreased breath sounds      Cardiovascular Exercise Tolerance: Good hypertension, Pt. on medications + Peripheral Vascular Disease  negative cardio ROS Normal cardiovascular exam Rhythm:Regular Rate:Normal     Neuro/Psych  Headaches, Seizures -,    Depression    negative neurological ROS  negative psych ROS   GI/Hepatic negative GI ROS, Neg liver ROS,,,  Endo/Other  negative endocrine ROSdiabetes, Type 2, Oral Hypoglycemic Agents  Class 3 obesity  Renal/GU negative Renal ROS  negative genitourinary   Musculoskeletal  (+) Arthritis ,    Abdominal  (+) + obese  Peds negative pediatric ROS (+)  Hematology negative hematology ROS (+)   Anesthesia Other Findings Past Medical History: No date: Actinic keratosis 12/23/2021: Basal cell carcinoma     Comment:  right upper temple ED&C 01/26/22 No date: Cancer [facility])     Comment:  SKIN  No date: Depression No date: Diabetes mellitus without complication (HCC)     Comment:  Type II No date: Dyspnea     Comment:  with exertion  No date: Fatigue 05/21/2016: History of basal cell carcinoma (BCC)     Comment:  right spinal upper back/superficial 09/26/2013: Hx of dysplastic nevus     Comment:  right malar cheek/mild No date: Hyperlipidemia No date: Hypertension No date: Hypogonadism male No date: Neuromuscular disorder (HCC)     Comment:  back nerve stimulator No date: Neuropathy No date: PAD (peripheral artery disease) No date: Sleep apnea     Comment:  CPAP No date:  Tinnitus No date: Tuberculosis     Comment:  POSITIVE  TB SKIN TEST 1992.6 [place] .was exposed to               someone who had it.  Past Surgical History: 03/04/2018: AMPUTATION TOE; Right     Comment:  Procedure: AMPUTATION TOE/MPJ JOINT FIFTH RIGHT;                Surgeon: Janit Thresa HERO, DPM;  Location: [facility] OR;  Service:              Podiatry;  Laterality: Right; 2012: BACK SURGERY     Comment:  neck was 2012,back same year. plate in R4-2.dupflojunm 01/31/2013: CARDIAC CATHETERIZATION     Comment:  Medical management No date: CARPAL TUNNEL RELEASE; Bilateral 2014: CATARACT EXTRACTION; Right 03/12/2016: CATARACT EXTRACTION W/PHACO; Left     Comment:  Procedure: CATARACT EXTRACTION PHACO AND INTRAOCULAR               LENS PLACEMENT (IOC);  Surgeon: Elsie Carmine, MD;                Location: [facility] ORS;  Service: Ophthalmology;  Laterality:              Left;  US  00:31AP% 17.9CDE 5.67Fluid pack lot # [id number] 2012: CERVICAL FUSION 2014: CORONARY ANGIOPLASTY  Comment:  all good 02/01/2022: IR ANGIO INTRA EXTRACRAN SEL INTERNAL CAROTID BILAT MOD SED 02/01/2022: IR ANGIO VERTEBRAL SEL VERTEBRAL UNI R MOD SED No date: KNEE ARTHROSCOPY; Left 01/31/2013: LEFT HEART CATHETERIZATION WITH CORONARY ANGIOGRAM; N/A     Comment:  Procedure: LEFT HEART CATHETERIZATION WITH CORONARY               ANGIOGRAM;  Surgeon: Lynwood Schilling, MD;  Location: [facility]               CATH LAB;  Service: Cardiovascular;  Laterality: N/A; 06/22/2011: LUMBAR LAMINECTOMY/DECOMPRESSION MICRODISCECTOMY     Comment:  Procedure: LUMBAR LAMINECTOMY/DECOMPRESSION               MICRODISCECTOMY;  Surgeon: Reyes JONETTA Budge;  Location:               [facility] NEURO ORS;  Service: Neurosurgery;  Laterality: N/A;                Thoracic Ten-Eleven,Thoracic Eleven-Twelve Laminectomy No date: Pain stimulator No date: ulnar; N/A 1991: VASECTOMY  BMI    Body Mass Index: 31.72 kg/m      Reproductive/Obstetrics negative OB ROS                               Anesthesia Physical Anesthesia Plan  ASA: 3  Anesthesia Plan: General   Post-op Pain Management:    Induction: Intravenous  PONV Risk Score and Plan: Propofol  infusion and TIVA  Airway Management Planned: Natural Airway and Nasal Cannula  Additional Equipment:   Intra-op Plan:   Post-operative Plan:   Informed Consent: I have reviewed the patients History and Physical, chart, labs and discussed the procedure including the risks, benefits and alternatives for the proposed anesthesia with the patient or authorized representative who has indicated his/her understanding and acceptance.     Dental Advisory Given  Plan Discussed with: CRNA  Anesthesia Plan Comments:         Anesthesia Quick Evaluation  "

## 2024-08-24 NOTE — H&P (Signed)
 "  Pre-Procedure H&P   Patient ID: Jeff [name] is a 64 y.o. male.  Gastroenterology Provider: [name], DO  Referring Provider: Dr. Bertrum PCP: Bertrum Charlie CROME, MD  Date: 08/24/2024  HPI [name] is a 64 y.o. male who presents today for Esophagogastroduodenoscopy and Colonoscopy for Dysphagia, personal history of colon polyp .  Patient noted worsening dysphagia over the last year.  Specifically with solids no issues with liquids.  Occasionally rare tablet issues.  He feels fullness on the 1 side of his neck after he swallows.  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 He reports regurgitation of undigested food at times.  Regular bowel meds without melena or hematochezia. No family history of colon cancer or colon polyps Colonoscopy in 2014 with 4 adenomatous polyps removed and diverticulosis noted   Past Medical History:  Diagnosis Date   Actinic keratosis    Basal cell carcinoma 12/23/2021   right upper temple ED&C 01/26/22   Cancer (HCC)    SKIN    Depression    Diabetes mellitus without complication (HCC)    Type II   Dyspnea    with exertion    Fatigue    History of basal cell carcinoma (BCC) 05/21/2016   right spinal upper back/superficial   Hx of dysplastic nevus 09/26/2013   right malar cheek/mild   Hyperlipidemia    Hypertension    Hypogonadism male    Neuromuscular disorder (HCC)    back nerve stimulator   Neuropathy    PAD (peripheral artery disease)    Sleep apnea    CPAP   Tinnitus    Tuberculosis    POSITIVE  TB SKIN TEST 1992.6 [place] .was exposed to someone who had it.    Past Surgical History:  Procedure Laterality Date   AMPUTATION TOE Right 03/04/2018   Procedure: AMPUTATION TOE/MPJ JOINT FIFTH RIGHT;  Surgeon: Janit Thresa HERO, DPM;  Location: [facility] OR;  Service: Podiatry;  Laterality: Right;   BACK SURGERY  2012   neck was 2012,back same year. plate in  R4-2.dupflojunm   CARDIAC CATHETERIZATION  01/31/2013   Medical management   CARPAL TUNNEL RELEASE Bilateral    CATARACT EXTRACTION Right 2014   CATARACT EXTRACTION W/PHACO Left 03/12/2016   Procedure: CATARACT EXTRACTION PHACO AND INTRAOCULAR LENS PLACEMENT (IOC);  Surgeon: Elsie Carmine, MD;  Location: [facility] ORS;  Service: Ophthalmology;  Laterality: Left;  US  00:31AP% 17.9CDE 5.67Fluid pack lot # [id number]   CERVICAL FUSION  2012   CORONARY ANGIOPLASTY  2014   all good   IR ANGIO INTRA EXTRACRAN SEL INTERNAL CAROTID BILAT MOD SED  02/01/2022   IR ANGIO VERTEBRAL SEL VERTEBRAL UNI R MOD SED  02/01/2022   KNEE ARTHROSCOPY Left    LEFT HEART CATHETERIZATION WITH CORONARY ANGIOGRAM N/A 01/31/2013   Procedure: LEFT HEART CATHETERIZATION WITH CORONARY ANGIOGRAM;  Surgeon: Lynwood Schilling, MD;  Location[facility] CATH LAB;  Service: Cardiovascular;  Laterality: N/A;   LUMBAR LAMINECTOMY/DECOMPRESSION MICRODISCECTOMY  06/22/2011   Procedure: LUMBAR LAMINECTOMY/DECOMPRESSION MICRODISCECTOMY;  Surgeon: Reyes JONETTA Budge;  Location: [facility] NEURO [facility]S;  Service: Neurosurgery;  Laterality: N/A;  Thoracic Ten-Eleven,Thoracic Eleven-Twelve Laminectomy   Pain stimulator     ulnar N/A    VASECTOMY  1991    Family History No h/o GI disease or malignancy  Review of Systems  Constitutional:  Negative for activity change, appetite change, chills, diaphoresis, fatigue, fever and unexpected weight change.  HENT:  Positive for  trouble swallowing. Negative for voice change.   Respiratory:  Negative for shortness of breath and wheezing.   Cardiovascular:  Negative for chest pain, palpitations and leg swelling.  Gastrointestinal:  Negative for abdominal distention, abdominal pain, anal bleeding, blood in stool, constipation, diarrhea, nausea and vomiting.  Musculoskeletal:  Negative for arthralgias and myalgias.  Skin:  Negative for color change and pallor.  Neurological:  Negative for dizziness, syncope and weakness.   Psychiatric/Behavioral:  Negative for confusion. The patient is not nervous/anxious.   All other systems reviewed and are negative.    Medications Medications Ordered Prior to Encounter[1]  Pertinent medications related to GI and procedure were reviewed by me with the patient prior to the procedure  Current Medications[2]  sodium chloride          Allergies[3] Allergies were reviewed by me prior to the procedure  Objective   Body mass index is 31.72 kg/m. Vitals:   08/24/24 1253  BP: (!) 168/85  Pulse: 76  Resp: 17  Temp: 97.6 F (36.4 C)  TempSrc: Temporal  SpO2: 99%  Weight: 94.6 kg  Height: 5' 8 (1.727 m)     Physical Exam Vitals and nursing note reviewed.  Constitutional:      General: He is not in acute distress.    Appearance: Normal appearance. He is not ill-appearing, toxic-appearing or diaphoretic.  HENT:     Head: Normocephalic and atraumatic.     Nose: Nose normal.     Mouth/Throat:     Mouth: Mucous membranes are moist.     Pharynx: Oropharynx is clear.  Eyes:     General: No scleral icterus.    Extraocular Movements: Extraocular movements intact.  Cardiovascular:     Rate and Rhythm: Normal rate and regular rhythm.     Heart sounds: Normal heart sounds. No murmur heard.    No friction rub. No gallop.  Pulmonary:     Effort: Pulmonary effort is normal. No respiratory distress.     Breath sounds: Normal breath sounds. No wheezing, rhonchi or rales.  Abdominal:     General: Bowel sounds are normal. There is no distension.     Palpations: Abdomen is soft.     Tenderness: There is no abdominal tenderness. There is no guarding or rebound.  Musculoskeletal:     Cervical back: Neck supple.     Right lower leg: No edema.     Left lower leg: No edema.  Skin:    General: Skin is warm and dry.     Coloration: Skin is not jaundiced or pale.  Neurological:     General: No focal deficit present.     Mental Status: He is alert and oriented to  person, place, and time. Mental status is at baseline.  Psychiatric:        Mood and Affect: Mood normal.        Behavior: Behavior normal.        Thought Content: Thought content normal.        Judgment: Judgment normal.      Assessment:  [name] is a 64 y.o. male  who presents today for Esophagogastroduodenoscopy and Colonoscopy for Dysphagia, personal history of colon polyp .  Plan:  Esophagogastroduodenoscopy and Colonoscopy with possible intervention today  Esophagogastroduodenoscopy and Colonoscopy with possible biopsy, control of bleeding, polypectomy, and interventions as necessary has been discussed with the patient/patient representative. Informed consent was obtained from the patient/patient representative after explaining the indication, nature, and risks of the  procedure including but not limited to death, bleeding, perforation, missed neoplasm/lesions, cardiorespiratory compromise, and reaction to medications. Opportunity for questions was given and appropriate answers were provided. Patient/patient representative has verbalized understanding is amenable to undergoing the procedure.   [name], DO  [facility]  Portions of the record may have been created with voice recognition software. Occasional wrong-word or 'sound-a-like' substitutions may have occurred due to the inherent limitations of voice recognition software.  Read the chart carefully and recognize, using context, where substitutions may have occurred.     [1]  No current facility-administered medications on file prior to encounter.   Current Outpatient Medications on File Prior to Encounter  Medication Sig Dispense Refill   losartan  (COZAAR ) 100 MG tablet TAKE 1 TABLET BY MOUTH EVERY DAY 30 tablet 5   metoprolol  succinate (TOPROL -XL) 50 MG 24 hr tablet TAKE 1 TABLET BY MOUTH DAILY. TAKE WITH OR IMMEDIATELY FOLLOWING A MEAL. 30 tablet 1   pregabalin  (LYRICA ) 100 MG  capsule Take 2 capsules (200 mg total) by mouth 2 (two) times daily. 360 capsule 0   acetaminophen  (TYLENOL ) 500 MG tablet Take 1 tablet (500 mg total) by mouth every 6 (six) hours as needed for mild pain. 30 tablet 0   amLODipine  (NORVASC ) 5 MG tablet Take 1 tablet (5 mg total) by mouth daily. 30 tablet 3   ARIPiprazole  (ABILIFY ) 5 MG tablet TAKE 1 TABLET BY MOUTH EVERY DAY 90 tablet 4   aspirin  EC (BAYER ASPIRIN  EC LOW DOSE) 81 MG tablet Take 81 mg by mouth daily.     ciprofloxacin -dexamethasone  (CIPRODEX ) OTIC suspension Place 4 drops into both ears 2 (two) times daily. 7.5 mL 0   empagliflozin  (JARDIANCE ) 25 MG TABS tablet Take 1 tablet (25 mg total) by mouth daily. 90 tablet 3   gentamicin  cream (GARAMYCIN ) 0.1 % Apply 1 application topically 3 (three) times daily. 15 g 1   hydrALAZINE  (APRESOLINE ) 25 MG tablet Take 25 mg by mouth 2 (two) times daily.     hydrocortisone  2.5 % cream APPLY TO FLAKY AREAS ON FACE ONCE TO TWICE DAILY AS DIRECTED. 28 g 5   ibuprofen  (ADVIL ) 200 MG tablet Take 400 mg by mouth every 6 (six) hours as needed for mild pain.     ketoconazole  (NIZORAL ) 2 % cream Apply to flaky areas on face once to twice daily as directed. 60 g 5   levETIRAcetam  (KEPPRA ) 500 MG tablet Take 1 tablet (500 mg total) by mouth 2 (two) times daily for 6 days. 12 tablet 0   metFORMIN  (GLUCOPHAGE ) 1000 MG tablet TAKE 1 TABLET (1,000 MG TOTAL) BY MOUTH TWICE A DAY WITH FOOD 60 tablet 1   mometasone  (ELOCON ) 0.1 % cream Apply 1 Application topically daily.     rosuvastatin  (CRESTOR ) 40 MG tablet Take 1 tablet (40 mg total) by mouth daily. 90 tablet 1   sildenafil  (REVATIO ) 20 MG tablet TAKE 1 TO 5 TABLETS BY MOUTH DAILY AS NEEDED (Patient taking differently: Take 20-100 mg by mouth daily as needed (erectile dysfunction).) 25 tablet 10  [2]  Current Facility-Administered Medications:    0.9 %  sodium chloride  infusion, , Intravenous, Continuous, Jungling Elspeth Ozell, DO [3]  Allergies Allergen  Reactions   Topamax  [Topiramate ] Other (See Comments)    Unresponsive episodes Syncope    Keppra  [Levetiracetam ] Palpitations and Other (See Comments)    jittery, and loopy. per pt.   Neurontin  [Gabapentin ] Other (See Comments)    Tremors jittery and loopy per  pt.   Cymbalta  [Duloxetine  Hcl] Other (See Comments)    jittery and loopy per pt   "

## 2024-08-24 NOTE — Interval H&P Note (Signed)
 History and Physical Interval Note: Preprocedure H&P from 08/24/24  was reviewed and there was no interval change after seeing and examining the patient.  Written consent was obtained from the patient after discussion of risks, benefits, and alternatives. Patient has consented to proceed with Esophagogastroduodenoscopy and Colonoscopy with possible intervention   08/24/2024 1:01 PM  Jeff [name]  has presented today for surgery, with the diagnosis of Esophageal dysphagia (R13.19) Personal history of adenomatous and serrated colon polyps (Z86.0101).  The various methods of treatment have been discussed with the patient and family. After consideration of risks, benefits and other options for treatment, the patient has consented to  Procedures: EGD (ESOPHAGOGASTRODUODENOSCOPY) (N/A) COLONOSCOPY (N/A) as a surgical intervention.  The patient's history has been reviewed, patient examined, no change in status, stable for surgery.  I have reviewed the patient's chart and labs.  Questions were answered to the patient's satisfaction.     [name]

## 2024-08-24 NOTE — Op Note (Signed)
 [facility] Patient Name: Jeff [name] Procedure Date: 08/24/2024 2:08 PM MRN: [id number] Account #: [id number] Date of Birth: 1960/09/01 Admit Type: Outpatient Age: 64 Room: [facility] ENDO ROOM 1 Gender: Male Note Status: Finalized Instrument Name: Upper GI Scope [id number] Procedure:             Upper GI endoscopy Indications:           Dysphagia Providers:             [name] DO, DO Referring MD:          Charlie CROME. Bertrum, MD (Referring MD) Medicines:             Monitored Anesthesia Care Complications:         No immediate complications. Estimated blood loss:                         Minimal. Procedure:             Pre-Anesthesia Assessment:                        - Prior to the procedure, a History and Physical was                         performed, and patient medications and allergies were                         reviewed. The patient is competent. The risks and                         benefits of the procedure and the sedation options and                         risks were discussed with the patient. All questions                         were answered and informed consent was obtained.                         Patient identification and proposed procedure were                         verified by the physician, the nurse, the anesthetist                         and the technician in the endoscopy suite. Mental                         Status Examination: alert and oriented. Airway                         Examination: normal oropharyngeal airway and neck                         mobility. Respiratory Examination: clear to                         auscultation. CV Examination: RRR, no murmurs, no S3  or S4. Prophylactic Antibiotics: The patient does not                         require prophylactic antibiotics. Prior                         Anticoagulants: The patient has taken no anticoagulant                          or antiplatelet agents. ASA Grade Assessment: II - A                         patient with mild systemic disease. After reviewing                         the risks and benefits, the patient was deemed in                         satisfactory condition to undergo the procedure. The                         anesthesia plan was to use monitored anesthesia care                         (MAC). Immediately prior to administration of                         medications, the patient was re-assessed for adequacy                         to receive sedatives. The heart rate, respiratory                         rate, oxygen  saturations, blood pressure, adequacy of                         pulmonary ventilation, and response to care were                         monitored throughout the procedure. The physical                         status of the patient was re-assessed after the                         procedure.                        After obtaining informed consent, the endoscope was                         passed under direct vision. Throughout the procedure,                         the patient's blood pressure, pulse, and oxygen                          saturations were monitored continuously. The Endoscope  was introduced through the mouth, and advanced to the                         second part of duodenum. The upper GI endoscopy was                         accomplished without difficulty. The patient tolerated                         the procedure well. Findings:      The duodenal bulb, first portion of the duodenum and second portion of       the duodenum were normal. Estimated blood loss: none.      Localized moderate inflammation characterized by erosions and erythema       was found in the gastric antrum. Biopsies were taken with a cold forceps       for histology. Estimated blood loss was minimal.      Diffuse atrophic mucosa was found in the gastric body. Imaging was        performed using white light and narrow band imaging to visualize the       mucosa. Estimated blood loss: none.      One benign-appearing, intrinsic mild (non-circumferential scarring)       stenosis was found 40 cm from the incisors. This stenosis measured 1.6       cm (inner diameter) x less than one cm (in length). The stenosis was       traversed. A TTS dilator was passed through the scope. Dilation with a       15-16.5-18 mm balloon dilator was performed to 18 mm. The dilation site       was examined following endoscope reinsertion and showed no change.       Estimated blood loss: none.      LA Grade A (one or more mucosal breaks less than 5 mm, not extending       between tops of 2 mucosal folds) esophagitis with no bleeding was found.       Biopsies were taken with a cold forceps for histology. Estimated blood       loss was minimal.      A non-bleeding diverticulum with a small opening and no stigmata of       recent bleeding was found in the middle third of the esophagus.       Diverticulum ~35 cm from incisors- ? traction diverticulum. no outright       pocket. No appreciable zenkers otherwise      The exam of the esophagus was otherwise normal. Impression:            - Normal duodenal bulb, first portion of the duodenum                         and second portion of the duodenum.                        - Gastritis. Biopsied.                        - Gastric mucosal atrophy.                        - Benign-appearing esophageal stenosis. Dilated.                        -  LA Grade A reflux esophagitis with no bleeding.                         Biopsied.                        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 Recommendation:        - Patient has a contact number available for                         emergencies. The signs and symptoms of potential                         delayed complications were discussed with the patient.                         Return to  normal activities tomorrow. Written                         discharge instructions were provided to the patient.                        - Discharge patient to home.                        - Resume previous diet.                        - Continue present medications.                        - Await pathology results.                        - Repeat upper endoscopy PRN for retreatment.                        - Return to GI office as previously scheduled.                        - The findings and recommendations were discussed with                         the patient. Procedure Code(s):     --- Professional ---                        [phone], Esophagogastroduodenoscopy, flexible,                         transoral; with transendoscopic balloon dilation of                         esophagus (less than 30 mm diameter)                        [id number], 59, Esophagogastroduodenoscopy, flexible,                         transoral; with biopsy, single or multiple Diagnosis Code(s):     --- Professional ---  K29.70, Gastritis, unspecified, without bleeding                        K31.89, Other diseases of stomach and duodenum                        K22.2, Esophageal obstruction                        K21.00, Gastro-esophageal reflux disease with                         esophagitis, without bleeding                        Q39.6, Congenital diverticulum of esophagus                        R13.10, Dysphagia, unspecified CPT copyright 2022 [employer]. All rights reserved. The codes documented in this report are preliminary and upon coder review may  be revised to meet current compliance requirements. Attending Participation:      I personally performed the entire procedure. Elspeth Jungling, DO [name] DO, DO 08/24/2024 2:32:38 PM This report has been signed electronically. Number of Addenda: 0 Note Initiated On: 08/24/2024 2:08 PM Estimated Blood Loss:   Estimated blood loss was minimal.      [facility]

## 2024-08-24 NOTE — Op Note (Signed)
 [facility] Gastroenterology Patient Name: Jeff [name] Procedure Date: 08/24/2024 2:07 PM MRN: [id number] Account #: [id number] Date of Birth: [birth year] Admit Type: Outpatient Age: 64 Room: [facility] ENDO ROOM 1 Gender: Male Note Status: Finalized Instrument Name: Colon Scope [id number] Procedure:             Colonoscopy Indications:           High risk colon cancer surveillance: Personal history                         of colonic polyps Providers:             Elspeth Ozell Onita ROSALEA, DO Referring MD:          Charlie CROME. Bertrum, MD (Referring MD) Medicines:             Monitored Anesthesia Care Complications:         No immediate complications. Estimated blood loss:                         Minimal. Procedure:             Pre-Anesthesia Assessment:                        - Prior to the procedure, a History and Physical was                         performed, and patient medications and allergies were                         reviewed. The patient is competent. The risks and                         benefits of the procedure and the sedation options and                         risks were discussed with the patient. All questions                         were answered and informed consent was obtained.                         Patient identification and proposed procedure were                         verified by the physician, the nurse, the anesthetist                         and the technician in the endoscopy suite. Mental                         Status Examination: alert and oriented. Airway                         Examination: normal oropharyngeal airway and neck                         mobility. Respiratory Examination: clear to  auscultation. CV Examination: RRR, no murmurs, no S3                         or S4. Prophylactic Antibiotics: The patient does not                         require prophylactic antibiotics. Prior                          Anticoagulants: The patient has taken no anticoagulant                         or antiplatelet agents. ASA Grade Assessment: II - A                         patient with mild systemic disease. After reviewing                         the risks and benefits, the patient was deemed in                         satisfactory condition to undergo the procedure. The                         anesthesia plan was to use monitored anesthesia care                         (MAC). Immediately prior to administration of                         medications, the patient was re-assessed for adequacy                         to receive sedatives. The heart rate, respiratory                         rate, oxygen  saturations, blood pressure, adequacy of                         pulmonary ventilation, and response to care were                         monitored throughout the procedure. The physical                         status of the patient was re-assessed after the                         procedure.                        After obtaining informed consent, the colonoscope was                         passed under direct vision. Throughout the procedure,                         the patient's blood pressure, pulse, and oxygen   saturations were monitored continuously. The                         Colonoscope was introduced through the anus and                         advanced to the the cecum, identified by appendiceal                         orifice and ileocecal valve. The colonoscopy was                         performed without difficulty. The patient tolerated                         the procedure well. The quality of the bowel                         preparation was evaluated using the BBPS [facility] Bowel                         Preparation Scale) with scores of: Right Colon = 3,                         Transverse Colon = 3 and Left Colon = 3 (entire mucosa                         seen well  with no residual staining, small fragments                         of stool or opaque liquid). The total BBPS score                         equals 9. The ileocecal valve, appendiceal orifice,                         and rectum were photographed. Findings:      Hemorrhoids were found on perianal exam.      The digital rectal exam was normal. Pertinent negatives include normal       sphincter tone.      Retroflexion in the right colon was performed.      Non-bleeding internal hemorrhoids were found during retroflexion and       during perianal exam. The hemorrhoids were Grade IV (internal       hemorrhoids that prolapse and cannot be reduced manually). Estimated       blood loss: none.      Eight sessile polyps were found in the descending colon (3), ascending       colon (3) and cecum (2). The polyps were 3 to 8 mm in size. These polyps       were removed with a cold snare. Resection and retrieval were complete.       Estimated blood loss was minimal.      An 11 to 13 mm polyp was found in the descending colon. The polyp was       semi-pedunculated. The polyp was removed with a hot snare. Resection and       retrieval were complete. Estimated blood loss  was minimal.      A 20 to 22 mm polyp was found in the cecum. The polyp was       semi-pedunculated. Area was successfully injected with 1 mL EverLift for       lesion assessment, and this injection appeared to lift the lesion       adequately. The polyp was removed with a hot snare. Resection and       retrieval were complete. To prevent bleeding after the polypectomy, one       hemostatic clip was successfully placed (MR conditional). There was no       bleeding at the end of the procedure. Estimated blood loss was minimal.      The exam was otherwise without abnormality on direct and retroflexion       views. Impression:            - Hemorrhoids found on perianal exam.                        - Non-bleeding internal hemorrhoids.                         - Eight 3 to 8 mm polyps in the descending colon, in                         the ascending colon and in the cecum, removed with a                         cold snare. Resected and retrieved.                        - One 11 to 13 mm polyp in the descending colon,                         removed with a hot snare. Resected and retrieved.                        - One 20 to 22 mm polyp in the cecum, removed with a                         hot snare. Resected and retrieved. Injected. Clip (MR                         conditional) was placed.                        - The examination was otherwise normal on direct and                         retroflexion views. Recommendation:        - Patient has a contact number available for                         emergencies. The signs and symptoms of potential                         delayed complications were discussed with the patient.  Return to normal activities tomorrow. Written                         discharge instructions were provided to the patient.                        - Discharge patient to home.                        - Resume previous diet.                        - Continue present medications.                        - No aspirin , ibuprofen , naproxen, or other                         non-steroidal anti-inflammatory drugs for 5 days after                         polyp removal.                        - Await pathology results.                        - Repeat colonoscopy in 1 year for surveillance based                         on pathology results.                        - Return to GI office as previously scheduled.                        - The findings and recommendations were discussed with                         the patient. Procedure Code(s):     --- Professional ---                        [phone], Colonoscopy, flexible; with removal of                         tumor(s), polyp(s), or other lesion(s) by  snare                         technique                        [id number], Colonoscopy, flexible; with directed submucosal                         injection(s), any substance Diagnosis Code(s):     --- Professional ---                        Z86.010, Personal history of colonic polyps                        K64.3, Fourth degree hemorrhoids  D12.4, Benign neoplasm of descending colon                        D12.2, Benign neoplasm of ascending colon                        D12.0, Benign neoplasm of cecum CPT copyright 2022 [employer]. All rights reserved. The codes documented in this report are preliminary and upon coder review may  be revised to meet current compliance requirements. Attending Participation:      I personally performed the entire procedure. Elspeth Jungling, DO [name] DO, DO 08/24/2024 3:13:12 PM This report has been signed electronically. Number of Addenda: 0 Note Initiated On: 08/24/2024 2:07 PM Scope Withdrawal Time: 0 hours 14 minutes 47 seconds  Total Procedure Duration: 0 hours 29 minutes 46 seconds  Estimated Blood Loss:  Estimated blood loss was minimal.      [facility]

## 2024-08-25 LAB — SURGICAL PATHOLOGY

## 2024-08-25 NOTE — Anesthesia Postprocedure Evaluation (Signed)
"   Anesthesia Post Note  Patient: Jeff [name]  Procedure(s) Performed: EGD (ESOPHAGOGASTRODUODENOSCOPY) COLONOSCOPY Balloon dilation wire-guided POLYPECTOMY, INTESTINE INJECTION, SUBMUCOSAL CONTROL OF HEMORRHAGE, GI TRACT, ENDOSCOPIC, BY CLIPPING OR OVERSEWING  Patient location during evaluation: PACU Anesthesia Type: General Level of consciousness: awake and alert, oriented and patient cooperative Pain management: pain level controlled Vital Signs Assessment: post-procedure vital signs reviewed and stable Respiratory status: spontaneous breathing, nonlabored ventilation and respiratory function stable Cardiovascular status: blood pressure returned to baseline and stable Postop Assessment: adequate PO intake Anesthetic complications: no   No notable events documented.   Last Vitals:  Vitals:   08/24/24 1521 08/24/24 1531  BP: 97/63 132/85  Pulse: 69 75  Resp: 10 15  Temp:    SpO2: 97% 100%    Last Pain:  Vitals:   08/24/24 1531  TempSrc:   PainSc: 0-No pain                 Alfonso Ruths      "

## 2025-05-08 ENCOUNTER — Encounter: Admitting: Dermatology
